# Patient Record
Sex: Male | Born: 1945
Health system: Southern US, Community
[De-identification: ages and names within clinical notes are randomized; demographics above are authoritative.]

## PROBLEM LIST (undated history)

## (undated) DIAGNOSIS — F32A Depression, unspecified: Secondary | ICD-10-CM

## (undated) DIAGNOSIS — M1990 Unspecified osteoarthritis, unspecified site: Secondary | ICD-10-CM

## (undated) DIAGNOSIS — F329 Major depressive disorder, single episode, unspecified: Secondary | ICD-10-CM

## (undated) DIAGNOSIS — H9190 Unspecified hearing loss, unspecified ear: Secondary | ICD-10-CM

## (undated) DIAGNOSIS — Z9889 Other specified postprocedural states: Secondary | ICD-10-CM

## (undated) DIAGNOSIS — I10 Essential (primary) hypertension: Secondary | ICD-10-CM

## (undated) DIAGNOSIS — I509 Heart failure, unspecified: Secondary | ICD-10-CM

## (undated) DIAGNOSIS — C670 Malignant neoplasm of trigone of bladder: Secondary | ICD-10-CM

## (undated) DIAGNOSIS — J449 Chronic obstructive pulmonary disease, unspecified: Secondary | ICD-10-CM

## (undated) DIAGNOSIS — IMO0001 Reserved for inherently not codable concepts without codable children: Secondary | ICD-10-CM

## (undated) DIAGNOSIS — R112 Nausea with vomiting, unspecified: Secondary | ICD-10-CM

## (undated) DIAGNOSIS — C801 Malignant (primary) neoplasm, unspecified: Secondary | ICD-10-CM

## (undated) HISTORY — DX: Malignant neoplasm of trigone of bladder: C67.0

---

## 2004-05-08 ENCOUNTER — Ambulatory Visit (HOSPITAL_COMMUNITY): Admission: RE | Admit: 2004-05-08 | Discharge: 2004-05-08 | Payer: Self-pay | Admitting: Internal Medicine

## 2013-03-29 ENCOUNTER — Other Ambulatory Visit (HOSPITAL_COMMUNITY): Payer: Self-pay | Admitting: Internal Medicine

## 2013-03-29 ENCOUNTER — Ambulatory Visit (HOSPITAL_COMMUNITY)
Admission: RE | Admit: 2013-03-29 | Discharge: 2013-03-29 | Disposition: A | Discharge status: Home / Self Care | Payer: Medicare Other | Source: Ambulatory Visit | Attending: Internal Medicine | Admitting: Internal Medicine

## 2013-03-29 DIAGNOSIS — F17200 Nicotine dependence, unspecified, uncomplicated: Secondary | ICD-10-CM | POA: Insufficient documentation

## 2013-03-29 DIAGNOSIS — R05 Cough: Secondary | ICD-10-CM | POA: Insufficient documentation

## 2013-03-29 DIAGNOSIS — R634 Abnormal weight loss: Secondary | ICD-10-CM

## 2013-03-29 DIAGNOSIS — R059 Cough, unspecified: Secondary | ICD-10-CM

## 2013-03-29 DIAGNOSIS — R0989 Other specified symptoms and signs involving the circulatory and respiratory systems: Secondary | ICD-10-CM | POA: Insufficient documentation

## 2013-03-29 DIAGNOSIS — I10 Essential (primary) hypertension: Secondary | ICD-10-CM | POA: Insufficient documentation

## 2013-03-31 ENCOUNTER — Ambulatory Visit (HOSPITAL_COMMUNITY)
Admission: RE | Admit: 2013-03-31 | Discharge: 2013-03-31 | Disposition: A | Discharge status: Home / Self Care | Payer: Medicare Other | Source: Ambulatory Visit | Attending: Internal Medicine | Admitting: Internal Medicine

## 2013-03-31 DIAGNOSIS — R911 Solitary pulmonary nodule: Secondary | ICD-10-CM | POA: Insufficient documentation

## 2013-03-31 DIAGNOSIS — R1032 Left lower quadrant pain: Secondary | ICD-10-CM | POA: Insufficient documentation

## 2013-03-31 DIAGNOSIS — R634 Abnormal weight loss: Secondary | ICD-10-CM

## 2013-03-31 DIAGNOSIS — R933 Abnormal findings on diagnostic imaging of other parts of digestive tract: Secondary | ICD-10-CM | POA: Insufficient documentation

## 2013-03-31 DIAGNOSIS — K7689 Other specified diseases of liver: Secondary | ICD-10-CM | POA: Insufficient documentation

## 2013-03-31 MED ORDER — IOHEXOL 300 MG/ML  SOLN
100.0000 mL | Freq: Once | INTRAMUSCULAR | Status: AC | PRN
  Administered 2013-03-31: 100 mL via INTRAVENOUS

## 2013-04-11 ENCOUNTER — Other Ambulatory Visit (HOSPITAL_COMMUNITY): Payer: Self-pay | Admitting: Internal Medicine

## 2013-04-11 DIAGNOSIS — R05 Cough: Secondary | ICD-10-CM

## 2013-04-11 DIAGNOSIS — R059 Cough, unspecified: Secondary | ICD-10-CM

## 2013-04-11 DIAGNOSIS — R634 Abnormal weight loss: Secondary | ICD-10-CM

## 2013-04-14 ENCOUNTER — Ambulatory Visit (HOSPITAL_COMMUNITY)
Admission: RE | Admit: 2013-04-14 | Discharge: 2013-04-14 | Disposition: A | Discharge status: Home / Self Care | Payer: Medicare Other | Source: Ambulatory Visit | Attending: Internal Medicine | Admitting: Internal Medicine

## 2013-04-14 DIAGNOSIS — R05 Cough: Secondary | ICD-10-CM | POA: Insufficient documentation

## 2013-04-14 DIAGNOSIS — R634 Abnormal weight loss: Secondary | ICD-10-CM

## 2013-04-14 DIAGNOSIS — K7689 Other specified diseases of liver: Secondary | ICD-10-CM | POA: Insufficient documentation

## 2013-04-14 DIAGNOSIS — R059 Cough, unspecified: Secondary | ICD-10-CM

## 2013-04-14 MED ORDER — IOHEXOL 300 MG/ML  SOLN
80.0000 mL | Freq: Once | INTRAMUSCULAR | Status: AC | PRN
  Administered 2013-04-14: 80 mL via INTRAVENOUS

## 2015-05-10 ENCOUNTER — Ambulatory Visit (INDEPENDENT_AMBULATORY_CARE_PROVIDER_SITE_OTHER): Payer: Medicare Other | Admitting: Otolaryngology

## 2015-05-10 DIAGNOSIS — J343 Hypertrophy of nasal turbinates: Secondary | ICD-10-CM | POA: Diagnosis not present

## 2015-05-10 DIAGNOSIS — J342 Deviated nasal septum: Secondary | ICD-10-CM | POA: Diagnosis not present

## 2015-07-19 ENCOUNTER — Ambulatory Visit (INDEPENDENT_AMBULATORY_CARE_PROVIDER_SITE_OTHER): Payer: Medicare Other | Admitting: Otolaryngology

## 2015-07-19 DIAGNOSIS — J342 Deviated nasal septum: Secondary | ICD-10-CM

## 2015-07-19 DIAGNOSIS — J343 Hypertrophy of nasal turbinates: Secondary | ICD-10-CM | POA: Diagnosis not present

## 2016-01-10 ENCOUNTER — Other Ambulatory Visit (HOSPITAL_COMMUNITY): Payer: Self-pay | Admitting: Internal Medicine

## 2016-01-10 DIAGNOSIS — R319 Hematuria, unspecified: Secondary | ICD-10-CM

## 2016-01-16 ENCOUNTER — Ambulatory Visit (HOSPITAL_COMMUNITY)
Admission: RE | Admit: 2016-01-16 | Discharge: 2016-01-16 | Disposition: A | Discharge status: Home / Self Care | Payer: Medicare Other | Source: Ambulatory Visit | Attending: Internal Medicine | Admitting: Internal Medicine

## 2016-01-16 DIAGNOSIS — R7989 Other specified abnormal findings of blood chemistry: Secondary | ICD-10-CM | POA: Insufficient documentation

## 2016-01-16 DIAGNOSIS — N329 Bladder disorder, unspecified: Secondary | ICD-10-CM | POA: Diagnosis not present

## 2016-01-16 DIAGNOSIS — R319 Hematuria, unspecified: Secondary | ICD-10-CM

## 2016-01-17 ENCOUNTER — Ambulatory Visit (INDEPENDENT_AMBULATORY_CARE_PROVIDER_SITE_OTHER): Payer: Medicare Other | Admitting: Otolaryngology

## 2016-01-29 ENCOUNTER — Ambulatory Visit: Payer: Medicare Other | Admitting: Urology

## 2016-02-05 ENCOUNTER — Ambulatory Visit (INDEPENDENT_AMBULATORY_CARE_PROVIDER_SITE_OTHER): Payer: Medicare Other | Admitting: Urology

## 2016-02-05 DIAGNOSIS — C679 Malignant neoplasm of bladder, unspecified: Secondary | ICD-10-CM | POA: Diagnosis not present

## 2016-02-12 ENCOUNTER — Other Ambulatory Visit: Payer: Self-pay

## 2016-02-12 ENCOUNTER — Other Ambulatory Visit: Payer: Self-pay | Admitting: Urology

## 2016-02-12 DIAGNOSIS — C670 Malignant neoplasm of trigone of bladder: Secondary | ICD-10-CM

## 2016-02-12 DIAGNOSIS — C679 Malignant neoplasm of bladder, unspecified: Secondary | ICD-10-CM

## 2016-02-12 DIAGNOSIS — R319 Hematuria, unspecified: Secondary | ICD-10-CM

## 2016-02-18 ENCOUNTER — Ambulatory Visit (HOSPITAL_COMMUNITY)
Admission: RE | Admit: 2016-02-18 | Discharge: 2016-02-18 | Disposition: A | Discharge status: Home / Self Care | Payer: Medicare Other | Source: Ambulatory Visit | Attending: Urology | Admitting: Urology

## 2016-02-18 DIAGNOSIS — I700 Atherosclerosis of aorta: Secondary | ICD-10-CM | POA: Insufficient documentation

## 2016-02-18 DIAGNOSIS — C670 Malignant neoplasm of trigone of bladder: Secondary | ICD-10-CM | POA: Insufficient documentation

## 2016-02-18 DIAGNOSIS — K439 Ventral hernia without obstruction or gangrene: Secondary | ICD-10-CM | POA: Diagnosis not present

## 2016-02-18 LAB — POCT I-STAT CREATININE: Creatinine, Ser: 1.4 mg/dL — ABNORMAL HIGH (ref 0.61–1.24)

## 2016-02-18 MED ORDER — IOHEXOL 300 MG/ML  SOLN
100.0000 mL | Freq: Once | INTRAMUSCULAR | Status: AC | PRN
Start: 2016-02-18 — End: 2016-02-18
  Administered 2016-02-18: 100 mL via INTRAVENOUS

## 2016-02-19 ENCOUNTER — Telehealth: Payer: Self-pay | Admitting: Urology

## 2016-02-19 NOTE — Telephone Encounter (Signed)
Carl Murphy Surgery Date: 03/25/16 between 12-1:00 With Dr Diona Fanti            Pre op: 03/20/16 Post op: 04/22/16 @ 1:30pm  Patient has been advised to call your office with any medical questions and to call me with any scheduling questions

## 2016-03-19 NOTE — Patient Instructions (Addendum)
Carl Murphy  03/19/2016     @PREFPERIOPPHARMACY @   Your procedure is scheduled on  03/25/2016   Report to [facility] at  89  A.M.  Call this number if you have problems the morning of surgery:  [phone]   Remember:  Do not eat food or drink liquids after midnight.  Take these medicines the morning of surgery with A SIP OF WATER  Coreg, celexa, hyzaar.   Do not wear jewelry, make-up or nail polish.  Do not wear lotions, powders, or perfumes.  You may wear deodorant.  Do not shave 48 hours prior to surgery.  Men may shave face and neck.  Do not bring valuables to the hospital.  [facility] is not responsible for any belongings or valuables.  Contacts, dentures or bridgework may not be worn into surgery.  Leave your suitcase in the car.  After surgery it may be brought to your room.  For patients admitted to the hospital, discharge time will be determined by your treatment team.  Patients discharged the day of surgery will not be allowed to drive home.   Name and phone number of your driver:   family Special instructions:  none  Please read over the following fact sheets that you were given. Coughing and Deep Breathing, Surgical Site Infection Prevention, Anesthesia Post-op Instructions and Care and Recovery After Surgery      Transurethral Resection, Bladder Tumor A cancerous growth (tumor) can develop on the inside wall of the bladder. The bladder is the organ that holds urine. One way to remove the tumor is a procedure called a transurethral resection. The tumor is removed (resected) through the tube that carries urine from the bladder out of the body (urethra). No cuts (incisions) are made in the skin. Instead, the procedure is done through a thin telescope, called a resectoscope. Attached to it is a light and usually a tiny camera. The resectoscope is put into the urethra. In men, the urethra opens at the end of the penis. In women, it opens just above  the vagina.  A transurethral resection is usually used to remove tumors that have not gotten too big or too deep. These are called Stage 0, Stage 1 or Stage 2 bladder cancers. LET YOUR CAREGIVER KNOW ABOUT:  On the day of the procedure, your caregivers will need to know the last time you had anything to eat or drink. This includes water, gum, and candy. In advance, make sure they know about:   Any allergies.  All medications you are taking, including:  Herbs, eyedrops, over-the-counter medications and creams.  Blood thinners (anticoagulants), aspirin or other drugs that could affect blood clotting.  Use of steroids (by mouth or as creams).  Previous problems with anesthetics, including local anesthetics.  Possibility of pregnancy, if this applies.  Any history of blood clots.  Any history of bleeding or other blood problems.  Previous surgery.  Smoking history.  Any recent symptoms of colds or infections.  Other health problems. RISKS AND COMPLICATIONS This is usually a safe procedure. Every procedure has risks, though. For a transurethral resection, they include:  Infection. Antibiotic medication would need to be taken.  Bleeding.  Light bleeding may last for several days after the procedure.  If bleeding continues or is heavy, the bladder may need rinsing. Or, a new catheter might be put in for awhile.  Sometimes bed rest is needed.  Urination problems.  Pain and burning  can occur when urinating. This usually goes away in a few days.  Scarring from the procedure can block the flow of urine.  Bladder damage.  It can be punctured or torn during removal of the tumor. If this happens, a catheter might be needed for longer. Antibiotics would be taken while the bladder heals.  Urine can leak through the hole or tear into the abdomen. If this happens, surgery may be needed to repair the bladder. BEFORE THE PROCEDURE   A medical evaluation will be done. This may  include:  A physical examination.  Urine test. This is to make sure you do not have a urinary tract infection.  Blood tests.  A test that checks the heart's rhythm (electrocardiogram).  Talking with an anesthesiologist. This is the person who will be in charge of the medication (anesthesia) to keep you from feeling pain during the transurethral resection. You might be asleep during the procedure (general anesthesia) or numb from the waist down, but awake during the procedure (spinal anesthesia). Ask your surgeon what to expect.  The person who is having a transurethral resection needs to give what is called informed consent. This requires signing a legal paper that gives permission for the procedure. To give informed consent:  You must understand how the procedure is done and why.  You must be told all the risks and benefits of the procedure.  You must sign the consent. Sometimes a legal guardian can do this.  Signing should be witnessed by a healthcare professional.  The day before the surgery, eat only a light dinner. Then, do not eat or drink anything for at least 8 hours before the surgery. Ask your caregiver if it is OK to take any needed medicines with a sip of water.  Arrive at least an hour before the surgery or whenever your surgeon recommends. This will give you time to check in and fill out any needed paperwork. PROCEDURE  The preparation:  You will change into a hospital gown.  A needle will be inserted in your arm. This is an intravenous access tube (IV). Medication will be able to flow directly into your body through this needle.  Small monitors will be put on your body. They are used to check your heart, blood pressure, and oxygen level.  You might be given medication that will help you relax (sedative).  You will be given a general anesthetic or spinal anesthesia.  The procedure:  Once you are asleep or numb from the waist down, your legs will be placed in  stirrups.  The resectoscope will be passed through the urethra into the bladder.  Fluid will be passed through the resectoscope. This will fill the bladder with water.  The surgeon will examine the bladder through the scope. If the scope has a camera, it can take pictures from inside the bladder. They can be projected onto a TV screen.  The surgeon will use various tools to remove the tumor in small pieces. Sometimes a laser (a beam of light energy) is used. Other tools may use electric current.  A tube (catheter) will often be placed so that urine can drain into a bag outside the body. This process helps stop bleeding. This tube keeps blood clots from blocking the urethra.  The procedure usually takes 30 to 45 minutes. AFTER THE PROCEDURE   You will stay in a recovery area until the anesthesia has worn off. Your blood pressure and pulse will be checked every so often. Then  you will be taken to a hospital room.  You may continue to get fluids through the IV for awhile.  Some pain is normal. The catheter might be uncomfortable. Pain is usually not severe. If it is, ask for pain medicine.  Your urine may look bloody after a transurethral resection. This is normal.  If bleeding is heavy, a hospital caregiver may rinse out the bladder (irrigation) through the catheter.  Once the urine is clear, the catheter will be taken out.  You will need to stay in the hospital until you can urinate on your own.  Most people stay in the hospital for up to 4 days. PROGNOSIS   Transurethral resection is considered the best way to treat bladder tumors that are not too far along. For most people, the treatment is successful. Sometimes, though, more treatment is needed.  Bladder cancers can come back even after a successful procedure. Because of this, be sure to have a checkup with your caregiver every 3 to 6 months. If everything is OK for 3 years, you can reduce the checkups to once a year.   This  information is not intended to replace advice given to you by your health care provider. Make sure you discuss any questions you have with your health care provider.   Document Released: 09/13/2009 Document Revised: 02/09/2012 Document Reviewed: 11/19/2009 Elsevier Interactive Patient Education 2016 [employer] PATIENT INSTRUCTIONS POST-ANESTHESIA  IMMEDIATELY FOLLOWING SURGERY:  Do not drive or operate machinery for the first twenty four hours after surgery.  Do not make any important decisions for twenty four hours after surgery or while taking narcotic pain medications or sedatives.  If you develop intractable nausea and vomiting or a severe headache please notify your doctor immediately.  FOLLOW-UP:  Please make an appointment with your surgeon as instructed. You do not need to follow up with anesthesia unless specifically instructed to do so.  WOUND CARE INSTRUCTIONS (if applicable):  Keep a dry clean dressing on the anesthesia/puncture wound site if there is drainage.  Once the wound has quit draining you may leave it open to air.  Generally you should leave the bandage intact for twenty four hours unless there is drainage.  If the epidural site drains for more than 36-48 hours please call the anesthesia department.  QUESTIONS?:  Please feel free to call your physician or the hospital operator if you have any questions, and they will be happy to assist you.

## 2016-03-20 ENCOUNTER — Other Ambulatory Visit: Payer: Self-pay

## 2016-03-20 ENCOUNTER — Encounter (HOSPITAL_COMMUNITY)
Admission: RE | Admit: 2016-03-20 | Discharge: 2016-03-20 | Disposition: A | Discharge status: Home / Self Care | Payer: Medicare Other | Source: Ambulatory Visit | Attending: Urology | Admitting: Urology

## 2016-03-20 ENCOUNTER — Encounter (HOSPITAL_COMMUNITY): Payer: Self-pay

## 2016-03-20 DIAGNOSIS — Z01818 Encounter for other preprocedural examination: Secondary | ICD-10-CM | POA: Diagnosis not present

## 2016-03-20 HISTORY — DX: Chronic obstructive pulmonary disease, unspecified: J44.9

## 2016-03-20 HISTORY — DX: Essential (primary) hypertension: I10

## 2016-03-20 HISTORY — DX: Depression, unspecified: F32.A

## 2016-03-20 HISTORY — DX: Heart failure, unspecified: I50.9

## 2016-03-20 HISTORY — DX: Major depressive disorder, single episode, unspecified: F32.9

## 2016-03-20 LAB — CBC WITH DIFFERENTIAL/PLATELET
BASOS PCT: 0 %
Basophils Absolute: 0 10*3/uL (ref 0.0–0.1)
EOS ABS: 0.2 10*3/uL (ref 0.0–0.7)
EOS PCT: 2 %
HCT: 40 % (ref 39.0–52.0)
HEMOGLOBIN: 13.1 g/dL (ref 13.0–17.0)
Lymphocytes Relative: 16 %
Lymphs Abs: 1.4 10*3/uL (ref 0.7–4.0)
MCH: 30.8 pg (ref 26.0–34.0)
MCHC: 32.8 g/dL (ref 30.0–36.0)
MCV: 93.9 fL (ref 78.0–100.0)
MONOS PCT: 10 %
Monocytes Absolute: 0.9 10*3/uL (ref 0.1–1.0)
NEUTROS PCT: 72 %
Neutro Abs: 6.4 10*3/uL (ref 1.7–7.7)
PLATELETS: 259 10*3/uL (ref 150–400)
RBC: 4.26 MIL/uL (ref 4.22–5.81)
RDW: 13.4 % (ref 11.5–15.5)
WBC: 8.9 10*3/uL (ref 4.0–10.5)

## 2016-03-20 LAB — BASIC METABOLIC PANEL
Anion gap: 9 (ref 5–15)
BUN: 42 mg/dL — AB (ref 6–20)
CALCIUM: 8.6 mg/dL — AB (ref 8.9–10.3)
CHLORIDE: 106 mmol/L (ref 101–111)
CO2: 24 mmol/L (ref 22–32)
CREATININE: 1.79 mg/dL — AB (ref 0.61–1.24)
GFR, EST AFRICAN AMERICAN: 43 mL/min — AB (ref >60)
GFR, EST NON AFRICAN AMERICAN: 37 mL/min — AB (ref >60)
Glucose, Bld: 101 mg/dL — ABNORMAL HIGH (ref 65–99)
Potassium: 4.4 mmol/L (ref 3.5–5.1)
SODIUM: 139 mmol/L (ref 135–145)

## 2016-03-20 NOTE — Pre-Procedure Instructions (Signed)
Patient given information to sign up for my chart at home. 

## 2016-03-24 MED ORDER — SODIUM CHLORIDE 0.9 % IV SOLN: 50.0000 mg | Freq: Once | INTRAVENOUS | Status: DC

## 2016-03-25 ENCOUNTER — Ambulatory Visit (HOSPITAL_COMMUNITY)
Admission: RE | Admit: 2016-03-25 | Discharge: 2016-03-26 | Disposition: A | Discharge status: Home / Self Care | Payer: Medicare Other | Source: Ambulatory Visit | Attending: Urology | Admitting: Urology

## 2016-03-25 ENCOUNTER — Encounter (HOSPITAL_COMMUNITY): Payer: Self-pay | Admitting: *Deleted

## 2016-03-25 ENCOUNTER — Ambulatory Visit (HOSPITAL_COMMUNITY): Payer: Medicare Other | Admitting: Anesthesiology

## 2016-03-25 ENCOUNTER — Encounter (HOSPITAL_COMMUNITY)
Admission: RE | Disposition: A | Discharge status: Home / Self Care | Payer: Self-pay | Source: Ambulatory Visit | Attending: Urology

## 2016-03-25 DIAGNOSIS — I509 Heart failure, unspecified: Secondary | ICD-10-CM | POA: Diagnosis not present

## 2016-03-25 DIAGNOSIS — I110 Hypertensive heart disease with heart failure: Secondary | ICD-10-CM | POA: Diagnosis not present

## 2016-03-25 DIAGNOSIS — F329 Major depressive disorder, single episode, unspecified: Secondary | ICD-10-CM | POA: Insufficient documentation

## 2016-03-25 DIAGNOSIS — F17210 Nicotine dependence, cigarettes, uncomplicated: Secondary | ICD-10-CM | POA: Insufficient documentation

## 2016-03-25 DIAGNOSIS — J449 Chronic obstructive pulmonary disease, unspecified: Secondary | ICD-10-CM | POA: Insufficient documentation

## 2016-03-25 DIAGNOSIS — C670 Malignant neoplasm of trigone of bladder: Secondary | ICD-10-CM | POA: Diagnosis present

## 2016-03-25 DIAGNOSIS — D494 Neoplasm of unspecified behavior of bladder: Secondary | ICD-10-CM | POA: Diagnosis present

## 2016-03-25 HISTORY — PX: TRANSURETHRAL RESECTION OF BLADDER TUMOR: SHX2575

## 2016-03-25 HISTORY — DX: Malignant neoplasm of trigone of bladder: C67.0

## 2016-03-25 SURGERY — TURBT (TRANSURETHRAL RESECTION OF BLADDER TUMOR)
Anesthesia: Spinal

## 2016-03-25 MED ORDER — HYDROCODONE-ACETAMINOPHEN 5-325 MG PO TABS
1.0000 | ORAL_TABLET | ORAL | Status: DC | PRN
  Administered 2016-03-25: 1 via ORAL
  Administered 2016-03-26: 2 via ORAL
  Filled 2016-03-25: qty 1
  Filled 2016-03-25: qty 2

## 2016-03-25 MED ORDER — MIDAZOLAM HCL 2 MG/2ML IJ SOLN
1.0000 mg | INTRAMUSCULAR | Status: DC | PRN
Start: 2016-03-25 — End: 2016-03-25
  Administered 2016-03-25: 2 mg via INTRAVENOUS

## 2016-03-25 MED ORDER — CEFAZOLIN SODIUM 1-5 GM-% IV SOLN
INTRAVENOUS | Status: AC
  Filled 2016-03-25: qty 50

## 2016-03-25 MED ORDER — CARVEDILOL 12.5 MG PO TABS
25.0000 mg | ORAL_TABLET | Freq: Two times a day (BID) | ORAL | Status: DC
  Administered 2016-03-25 – 2016-03-26 (×2): 25 mg via ORAL
  Filled 2016-03-25: qty 2
  Filled 2016-03-25 (×2): qty 1
  Filled 2016-03-25: qty 2

## 2016-03-25 MED ORDER — LOSARTAN POTASSIUM-HCTZ 100-25 MG PO TABS: 1.0000 | ORAL_TABLET | Freq: Every day | ORAL | Status: DC

## 2016-03-25 MED ORDER — ZOLPIDEM TARTRATE 5 MG PO TABS: 5.0000 mg | ORAL_TABLET | Freq: Every evening | ORAL | Status: DC | PRN

## 2016-03-25 MED ORDER — MIDAZOLAM HCL 2 MG/2ML IJ SOLN
INTRAMUSCULAR | Status: AC
  Filled 2016-03-25: qty 2

## 2016-03-25 MED ORDER — SULFAMETHOXAZOLE-TRIMETHOPRIM 800-160 MG PO TABS: 1.0000 | ORAL_TABLET | Freq: Two times a day (BID) | ORAL | Status: DC

## 2016-03-25 MED ORDER — ONDANSETRON HCL 4 MG/2ML IJ SOLN: 4.0000 mg | Freq: Once | INTRAMUSCULAR | Status: DC | PRN

## 2016-03-25 MED ORDER — NICOTINE 21 MG/24HR TD PT24
21.0000 mg | MEDICATED_PATCH | Freq: Every day | TRANSDERMAL | Status: DC
  Administered 2016-03-25 – 2016-03-26 (×2): 21 mg via TRANSDERMAL
  Filled 2016-03-25 (×2): qty 1

## 2016-03-25 MED ORDER — FENTANYL CITRATE (PF) 100 MCG/2ML IJ SOLN: 25.0000 ug | INTRAMUSCULAR | Status: DC | PRN

## 2016-03-25 MED ORDER — FENTANYL CITRATE (PF) 100 MCG/2ML IJ SOLN
INTRAMUSCULAR | Status: AC
  Filled 2016-03-25: qty 2

## 2016-03-25 MED ORDER — CITALOPRAM HYDROBROMIDE 20 MG PO TABS
20.0000 mg | ORAL_TABLET | Freq: Every day | ORAL | Status: DC
  Administered 2016-03-25: 20 mg via ORAL
  Filled 2016-03-25: qty 1

## 2016-03-25 MED ORDER — FENTANYL CITRATE (PF) 100 MCG/2ML IJ SOLN
INTRAMUSCULAR | Status: DC | PRN
  Administered 2016-03-25: 20 ug via INTRATHECAL

## 2016-03-25 MED ORDER — LOSARTAN POTASSIUM 50 MG PO TABS
100.0000 mg | ORAL_TABLET | Freq: Every day | ORAL | Status: DC
  Administered 2016-03-25 – 2016-03-26 (×2): 100 mg via ORAL
  Filled 2016-03-25 (×2): qty 2

## 2016-03-25 MED ORDER — STERILE WATER FOR IRRIGATION IR SOLN
Status: DC | PRN
  Administered 2016-03-25: 6000 mL
  Administered 2016-03-25: 3000 mL

## 2016-03-25 MED ORDER — FLUTICASONE PROPIONATE 50 MCG/ACT NA SUSP: 1.0000 | Freq: Every day | NASAL | Status: DC | PRN

## 2016-03-25 MED ORDER — SODIUM CHLORIDE 0.9 % IV SOLN
50.0000 mg | Freq: Once | INTRAVENOUS | Status: DC
  Filled 2016-03-25: qty 25

## 2016-03-25 MED ORDER — PROPOFOL 10 MG/ML IV BOLUS
INTRAVENOUS | Status: AC
  Filled 2016-03-25: qty 20

## 2016-03-25 MED ORDER — BUPIVACAINE IN DEXTROSE 0.75-8.25 % IT SOLN
INTRATHECAL | Status: DC | PRN
  Administered 2016-03-25: 1.5 mL via INTRATHECAL

## 2016-03-25 MED ORDER — ONDANSETRON HCL 4 MG/2ML IJ SOLN
INTRAMUSCULAR | Status: AC
  Filled 2016-03-25: qty 2

## 2016-03-25 MED ORDER — PROPOFOL 500 MG/50ML IV EMUL
INTRAVENOUS | Status: DC | PRN
  Administered 2016-03-25: 50 ug/kg/min via INTRAVENOUS

## 2016-03-25 MED ORDER — HYDROCHLOROTHIAZIDE 25 MG PO TABS
25.0000 mg | ORAL_TABLET | Freq: Every day | ORAL | Status: DC
  Administered 2016-03-25 – 2016-03-26 (×2): 25 mg via ORAL
  Filled 2016-03-25 (×2): qty 1

## 2016-03-25 MED ORDER — SODIUM CHLORIDE 0.45 % IV SOLN
INTRAVENOUS | Status: DC
  Administered 2016-03-25 – 2016-03-26 (×2): via INTRAVENOUS

## 2016-03-25 MED ORDER — STERILE WATER FOR IRRIGATION IR SOLN
Status: DC | PRN
  Administered 2016-03-25: 1000 mL

## 2016-03-25 MED ORDER — ACETAMINOPHEN 325 MG PO TABS: 650.0000 mg | ORAL_TABLET | ORAL | Status: DC | PRN

## 2016-03-25 MED ORDER — CEFAZOLIN SODIUM-DEXTROSE 2-4 GM/100ML-% IV SOLN
INTRAVENOUS | Status: AC
  Filled 2016-03-25: qty 100

## 2016-03-25 MED ORDER — BELLADONNA ALKALOIDS-OPIUM 16.2-60 MG RE SUPP: 1.0000 | Freq: Four times a day (QID) | RECTAL | Status: DC | PRN

## 2016-03-25 MED ORDER — IPRATROPIUM-ALBUTEROL 0.5-2.5 (3) MG/3ML IN SOLN
3.0000 mL | Freq: Once | RESPIRATORY_TRACT | Status: AC
  Administered 2016-03-25: 3 mL via RESPIRATORY_TRACT

## 2016-03-25 MED ORDER — DEXTROSE 5 % IV SOLN
1.0000 g | INTRAVENOUS | Status: AC
  Administered 2016-03-25: 2 g via INTRAVENOUS
  Filled 2016-03-25: qty 10

## 2016-03-25 MED ORDER — DOCUSATE SODIUM 100 MG PO CAPS
100.0000 mg | ORAL_CAPSULE | Freq: Two times a day (BID) | ORAL | Status: DC
  Administered 2016-03-25 – 2016-03-26 (×2): 100 mg via ORAL
  Filled 2016-03-25 (×2): qty 1

## 2016-03-25 MED ORDER — CEFAZOLIN SODIUM 1-5 GM-% IV SOLN: 1.0000 g | Freq: Once | INTRAVENOUS | Status: DC

## 2016-03-25 MED ORDER — IPRATROPIUM-ALBUTEROL 0.5-2.5 (3) MG/3ML IN SOLN
RESPIRATORY_TRACT | Status: AC
  Filled 2016-03-25: qty 3

## 2016-03-25 MED ORDER — EPHEDRINE SULFATE 50 MG/ML IJ SOLN
INTRAMUSCULAR | Status: DC | PRN
  Administered 2016-03-25 (×2): 10 mg via INTRAVENOUS

## 2016-03-25 MED ORDER — SULFAMETHOXAZOLE-TRIMETHOPRIM 800-160 MG PO TABS
1.0000 | ORAL_TABLET | Freq: Two times a day (BID) | ORAL | Status: DC
  Administered 2016-03-25 – 2016-03-26 (×2): 1 via ORAL
  Filled 2016-03-25 (×2): qty 1

## 2016-03-25 MED ORDER — LACTATED RINGERS IV SOLN
INTRAVENOUS | Status: DC
  Administered 2016-03-25 (×2): via INTRAVENOUS

## 2016-03-25 SURGICAL SUPPLY — 21 items
BAG URINE DRAINAGE (UROLOGICAL SUPPLIES) ×2 IMPLANT
BAG URO CATCHER STRL LF (MISCELLANEOUS) ×2 IMPLANT
CATH FOLEY 2WAY SLVR 30CC 22FR (CATHETERS) ×2 IMPLANT
CATH INTERMIT  6FR 70CM (CATHETERS) ×2 IMPLANT
ELECT LOOP 22F BIPOLAR SML (ELECTROSURGICAL) ×2
ELECT REM PT RETURN 9FT ADLT (ELECTROSURGICAL) ×4
ELECTRODE LOOP 22F BIPOLAR SML (ELECTROSURGICAL) ×1 IMPLANT
ELECTRODE REM PT RTRN 9FT ADLT (ELECTROSURGICAL) ×2 IMPLANT
GLOVE BIOGEL M 7.0 STRL (GLOVE) ×2 IMPLANT
GLOVE BIOGEL M 8.0 STRL (GLOVE) ×2 IMPLANT
GLOVE BIOGEL PI IND STRL 7.0 (GLOVE) ×1 IMPLANT
GLOVE BIOGEL PI INDICATOR 7.0 (GLOVE) ×1
GLOVE EXAM NITRILE MD LF STRL (GLOVE) ×2 IMPLANT
GOWN STRL REUS W/ TWL XL LVL3 (GOWN DISPOSABLE) ×3 IMPLANT
GOWN STRL REUS W/TWL XL LVL3 (GOWN DISPOSABLE) ×3
MANIFOLD NEPTUNE II (INSTRUMENTS) ×2 IMPLANT
PACK CYSTO (CUSTOM PROCEDURE TRAY) ×2 IMPLANT
PLUG CATH AND CAP STER (CATHETERS) ×4 IMPLANT
SYR 30ML LL (SYRINGE) ×2 IMPLANT
WATER STERILE IRR 1000ML POUR (IV SOLUTION) ×2 IMPLANT
WATER STERILE IRR 3000ML UROMA (IV SOLUTION) ×6 IMPLANT

## 2016-03-25 NOTE — Transfer of Care (Signed)
Immediate Anesthesia Transfer of Care Note  Patient: Carl Murphy  Procedure(s) Performed: Procedure(s): TRANSURETHRAL RESECTION OF BLADDER TUMOR (TURBT) (N/A)  Patient Location: PACU  Anesthesia Type:Spinal  Level of Consciousness: awake  Airway & Oxygen Therapy: Patient Spontanous Breathing and Patient connected to nasal cannula oxygen  Post-op Assessment: Report given to RN and Post -op Vital signs reviewed and stable  Post vital signs: Reviewed and stable  Last Vitals:  Filed Vitals:   03/25/16 1245 03/25/16 1250  BP:  146/75  Pulse:    Resp: 13 36    Complications: No apparent anesthesia complications

## 2016-03-25 NOTE — Op Note (Signed)
Preoperative diagnosis: 3 cm bladder tumor  Postoperative diagnosis: 6 cm bladder tumor  Principal procedure: TURBT of a 6 cm bladder tumor, placement of epirubicin intravesically  Surgeon: Eamon Tantillo  Anesthesia: Subarachnoid block  Specimen: Bladder tumor, to pathology  Drain: 22 French Foley catheter  Complications: None  Blood loss: Less than 25 mL  Indications: 70 year old male with gross hematuria, who was referred following renal ultrasound revealing a bladder tumor. CT revealed normal upper tracts, the previously noted bladder tumor, with cystoscopy confirming a large bladder tumor at the trigonal region. It was recommended that the patient undergo TURBT. I discussed this procedure with the patient and his daughter. They understand the procedure, the technique of placing epirubicin in the bladder postoperatively to limit the risk of recurrence, as well as risks and complications of TURBT. They understand and desire to proceed.  Description of procedure: Patient was properly identified in the holding area. He received preoperative IV antibiotics. He was taken to the operating room where subarachnoid block was administered. He was placed in the dorsolithotomy position. Genitalia and perineum were prepped and draped. Proper timeout was performed.  His urethral meatus was dilated to 30 [place] with [employer] sounds. I then passed a 28 French resectoscope sheath using the visual obturator. There were no urethral lesions. Prostate was nonobstructive. Bladder was entered and inspected circumferentially. Ureteral orifices were normal in location and appearance bilaterally. There was a large papillary tumor emanating from the right side of the bladder, just superior to the bladder neck. It was quite large, I felt larger than 5 cm, although it been measured at just over 3 cm on CT scan. No other bladder lesions were seen. I then placed a 6 [place] open-ended catheter in the right ureter to protect  the ureteral orifice during resection. I then removed the obturator, and passed the [place] with the cutting loop. I then proceeded to resect the base of the tumor, starting inferiorly, moving in a superior direction, staying even with the surface of the bladder. The base of the bladder tumor was a proximally 2 cm in diameter, with the main body of the tumor being a proximally 6 cm. The entire base of the bladder tumor was resected, down to the muscular layer. There was minimal erythema around the base of the bladder tumor. During resection, no perforation was noted in the wall of the bladder. Following resection of the entire bladder tumor, the bladder tumor base was then cauterized, and hemostasis was excellent. At this point, the bladder tumor fragments were irrigated from the bladder and sent for permanent specimen. These were labeled "bladder tumor". Inspection of the latter tumor base revealed persistent hemostasis. At this point, the resectoscope was removed, as well as the ureteral catheter. I then placed a 22 French Foley catheter, the balloon being filled with 30 mL of water. This was hooked to dependent drainage. Patient was then taken to the PACU in stable condition for recovery.  While in the PACU, I placed 50 mL of diluent with 50 mg of epirubicin and his catheter. The catheter was then plugged. The solution will be left indwelling for 1 hour, to be followed by drainage.

## 2016-03-25 NOTE — Anesthesia Procedure Notes (Addendum)
Spinal Patient location during procedure: OR Start time: 03/25/2016 1:08 PM Staffing Resident/CRNA: Tressie Stalker E Preanesthetic Checklist Completed: patient identified, site marked, surgical consent, pre-op evaluation, timeout performed, IV checked, risks and benefits discussed and monitors and equipment checked Spinal Block Patient position: left lateral decubitus Prep: Betadine Patient monitoring: heart rate, cardiac monitor, continuous pulse ox and blood pressure Approach: left paramedian Location: L3-4 Injection technique: single-shot Needle Needle type: Spinocan  Needle gauge: 22 G Needle length: 9 cm Assessment Sensory level: T8 Additional Notes  ATTEMPTS:1 TRAY MT:[id number] TRAY EXPIRATION DATE:10/2016

## 2016-03-25 NOTE — Progress Notes (Signed)
Dr Diona Fanti at bedside. Epirubicin 50 mg instilled in bladder per Dr Diona Fanti. Foley clamped.

## 2016-03-25 NOTE — Anesthesia Preprocedure Evaluation (Signed)
Anesthesia Evaluation  Patient identified by MRN, date of birth, ID band Patient awake    Reviewed: Allergy & Precautions, NPO status , Patient's Chart, lab work & pertinent test results, reviewed documented beta blocker date and time   Airway Mallampati: III  TM Distance: <3 FB Neck ROM: Full    Dental  (+) Missing, Caps, Chipped,    Pulmonary COPD,  COPD inhaler, Current Smoker,     + decreased breath sounds+ wheezing      Cardiovascular hypertension, Pt. on medications and Pt. on home beta blockers +CHF  Normal cardiovascular exam     Neuro/Psych Depression    GI/Hepatic   Endo/Other    Renal/GU Renal InsufficiencyRenal disease     Musculoskeletal   Abdominal Normal abdominal exam  (+)   Peds  Hematology   Anesthesia Other Findings   Reproductive/Obstetrics                             Anesthesia Physical Anesthesia Plan  ASA: III  Anesthesia Plan: Spinal   Post-op Pain Management:    Induction: Intravenous  Airway Management Planned: Nasal Cannula  Additional Equipment:   Intra-op Plan:   Post-operative Plan:   Informed Consent: I have reviewed the patients History and Physical, chart, labs and discussed the procedure including the risks, benefits and alternatives for the proposed anesthesia with the patient or authorized representative who has indicated his/her understanding and acceptance.   Dental advisory given  Plan Discussed with: CRNA  Anesthesia Plan Comments:         Anesthesia Quick Evaluation

## 2016-03-25 NOTE — Discharge Instructions (Signed)
Transurethral Resection of Bladder Tumor (TURBT)  °Definition:  °Transurethral Resection of the Bladder Tumor is a surgical procedure used to diagnose and remove tumors within the bladder. TURBT is the most common treatment for early stage bladder cancer.  ° °General instructions:  °Your recent bladder surgery requires very little post hospital care but some definite precautions.  °Despite the fact that no skin incisions were used, the area around the tumor removal site is raw and covered with scabs to promote healing and prevent bleeding. Certain precautions are needed to insure that the scabs are not disturbed over the next 2-4 weeks while the healing proceeds.  °Because the raw surface inside your bladder and the irritating effects of urine you may expect frequency of urination and/or urgency (a stronger desire to urinate) and perhaps even getting up at night more often. This will usually resolve or improve slowly over the healing period. You may see some blood in your urine over the first 6 weeks. Do not be alarmed, even if the urine was clear for a while. Get off your feet and drink lots of fluids until clearing occurs. If you start to pass clots or don't improve call us.  ° °Diet:  °You may return to your normal diet immediately. Because of the raw surface of your bladder, alcohol, spicy foods, foods high in acid and drinks with caffeine may cause irritation or frequency and should be used in moderation. To keep your urine flowing freely and avoid constipation, drink plenty of fluids during the day (8-10 glasses). Tip: Avoid cranberry juice because it is very acidic. °  °Activity:  °Your physical activity needs to be restricted somewhat.  We suggest that you reduce your activity under the circumstances until the bleeding has stopped. Heavy lifting (greater than 20 lbs.) and heavy exertion should be limited for 2-3 weeks. ° °Bowels:  °It is important to keep your bowels regular during the postoperative period.  Straining with bowel movements can cause bleeding. A bowel movement every other day is reasonable. Use a mild laxative if needed, such as milk of magnesia 2-3 tablespoons, or 2 Dulcolax tablets. Call if you continue to have problems. If you had been taking narcotics for pain, before, during or after your surgery, you may be constipated.  ° °Medication:  °You should resume your pre-surgery medications unless told not to. In addition you may be given an antibiotic to prevent or treat infection. Antibiotics are not always necessary. All medication should be taken as prescribed until the bottles are finished unless you are having an unusual reaction to one of the drugs.  °General Anesthetic, Adult  °A doctor specialized in giving anesthesia (anesthesiologist) or a nurse specialized in giving anesthesia (nurse anesthetist) gives medicine that makes you sleep while a procedure is performed (general anesthetic). Once the general anesthetic has been administered, you will be in a sleeplike state in which you feel no pain. After having a general anesthetic you may feel:  °Dizzy.  °Weak.  °Drowsy.  °Confused.  °These feelings are normal and can be expected to last for up to 24 hours after the procedure. ° ° °LET YOUR CAREGIVER KNOW ABOUT:  °Allergies you have.  °Medications you are taking, including herbs, eye drops, over the counter medications, dietary supplements, and creams.  °Previous problems you have had with anesthetics or numbing medicines.  °Use of cigarettes, alcohol, or illicit drugs.  °Possibility of pregnancy, if this applies.  °History of bleeding or blood disorders, including blood clots and clotting   disorders.  °Previous surgeries you have had and types of anesthetics you have received.  °Family medical history, especially anesthetic problems.  °Other health problems.  ° °AFTER THE PROCEDURE  °After surgery, you will be taken to the recovery area where a nurse will monitor your progress. You will be allowed  to go home when you are awake, stable, taking fluids well, and without serious pain or complications.  °For the first 24 hours following an anesthetic:  °Have a responsible person with you.  °Do not drive a car. If you are alone, do not take public transportation.  °Do not engage in strenuous activity. You may usually resume normal activities the next day, or as advised by your caregiver.  °Do not drink alcohol.  °Do not take medicine that has not been prescribed by your caregiver.  °Do not sign important papers or make important decisions as your judgement may be impaired.  °You may resume a normal diet as directed.  °Change bandages (dressings) as directed.  °Only take over-the-counter or prescription medicines for pain, discomfort, or fever as directed by your caregiver.  °If you have questions or problems that seem related to the anesthetic, call the hospital and ask for the anesthetist, anesthesiologist, or anesthesia department.  ° °SEEK IMMEDIATE MEDICAL CARE IF:  °You develop a rash.  °You have difficulty breathing.  °You have chest pain.  °You have allergic problems.  °You have uncontrolled nausea.  °You have uncontrolled vomiting.  °You develop any serious bleeding, especially from the incision site.  °Document Released: 02/24/2008 Document Revised: 07/30/2011 Document Reviewed: 03/20/2011  °Exit[employer]® Patient Information ©2012 [employer].  ° ° °

## 2016-03-25 NOTE — H&P (Signed)
  Urology History and Physical Exam  CC: Bladder tumor  HPI: 70 year old male presents  For TURBT of a 3 cm right trigonal bladder tumor. This was apparent on an U/S performed for gross hematuria and confirmed by office cystoscopy. The patient has noted intermittent gross hematuria for 3 months or so.  He denies any abdominal pain, flank pain, prior history of kidney stones.  He does pass clots with this.  He has minimal dysuria.  He has not been treated for urinary tract infections previously.   PMH: Past Medical History  Diagnosis Date  . Hypertension   . Depression   . CHF (congestive heart failure) ([place])   . COPD (chronic obstructive pulmonary disease) (HCC)     PSH: No past surgical history on file.  Allergies: No Known Allergies  Medications: No prescriptions prior to admission     Social History: Social History   Social History  . Marital Status: Married    Spouse Name: N/A  . Number of Children: N/A  . Years of Education: N/A   Occupational History  . Not on file.   Social History Main Topics  . Smoking status: Current Every Day Smoker -- 2.00 packs/day for 50 years    Types: Cigarettes  . Smokeless tobacco: Not on file  . Alcohol Use: No     Comment: rare  . Drug Use: No  . Sexual Activity: No   Other Topics Concern  . Not on file   Social History Narrative  . No narrative on file    Family History: No family history on file.  Review of Systems: Genitourinary, constitutional, skin, eye, otolaryngeal, hematologic/lymphatic, cardiovascular, pulmonary, endocrine, musculoskeletal, gastrointestinal, neurological and psychiatric system(s) were reviewed and pertinent findings if present are noted and are otherwise negative.  Genitourinary: urinary frequency, feelings of urinary urgency, dysuria, nocturia, incontinence and hematuria.  Hematologic/Lymphatic: a tendency to easily bruise.  Respiratory: shortness of breath.           Physical  Exam: Physical Exam Constitutional: Well nourished and well developed . No acute distress.  ENT:. The ears and nose are normal in appearance.  Neck: The appearance of the neck is normal and no neck mass is present.  Pulmonary: No respiratory distress and normal respiratory rhythm and effort.  Abdomen: The abdomen is soft and nontender. No masses are palpated. No CVA tenderness.  An umbilical hernia is present, which is reducible. No hepatosplenomegaly noted.  Genitourinary: Examination of the penis demonstrates no discharge, no masses, no lesions and a normal meatus. The penis is uncircumcised. The scrotum is normal in appearance and without lesions. The right epididymis is palpably normal and non-tender. The left epididymis is palpably normal and non-tender. The right testis is palpably normal, non-tender and without masses. The left testis is normal, non-tender and without masses.  Lymphatics: The femoral and inguinal nodes are not enlarged or tender.  Skin: Normal skin turgor, no visible rash and no visible skin lesions.  Neuro/Psych:. Mood and affect are appropriate.   Studies:  No results for input(s): HGB, WBC, PLT in the last 72 hours.  No results for input(s): NA, K, CL, CO2, BUN, CREATININE, CALCIUM, GFRNONAA, GFRAA in the last 72 hours.  Invalid input(s): MAGNESIUM   No results for input(s): INR, APTT in the last 72 hours.  Invalid input(s): PT   Invalid input(s): ABG    Assessment:  3 cm right trigonal bladder tumor  Plan: TURBT followed by intravesical epirubicin instillation

## 2016-03-25 NOTE — Progress Notes (Signed)
Drainage bag connected to foley and draining clear red urine.

## 2016-03-26 ENCOUNTER — Encounter (HOSPITAL_COMMUNITY): Payer: Self-pay | Admitting: Urology

## 2016-03-26 DIAGNOSIS — C670 Malignant neoplasm of trigone of bladder: Secondary | ICD-10-CM | POA: Diagnosis not present

## 2016-03-26 MED ORDER — HYDROCODONE-ACETAMINOPHEN 5-325 MG PO TABS: 1.0000 | ORAL_TABLET | ORAL | Status: DC | PRN

## 2016-03-26 MED ORDER — ONDANSETRON HCL 4 MG/2ML IJ SOLN
4.0000 mg | Freq: Four times a day (QID) | INTRAMUSCULAR | Status: DC | PRN
  Administered 2016-03-26: 4 mg via INTRAVENOUS
  Filled 2016-03-26: qty 2

## 2016-03-26 NOTE — Addendum Note (Signed)
Addendum  created 03/26/16 1611 by Mickel Baas, CRNA   Modules edited: Charges VN

## 2016-03-26 NOTE — Anesthesia Postprocedure Evaluation (Signed)
Anesthesia Post Note  Patient: Carl Murphy  Procedure(s) Performed: Procedure(s) (LRB): TRANSURETHRAL RESECTION OF BLADDER TUMOR (TURBT) (N/A)  Patient location during evaluation: PACU Anesthesia Type: Spinal Level of consciousness: awake and alert and oriented Pain management: pain level controlled Vital Signs Assessment: post-procedure vital signs reviewed and stable Respiratory status: spontaneous breathing Cardiovascular status: stable Anesthetic complications: no    Last Vitals:  Filed Vitals:   03/25/16 1630 03/26/16 0500  BP: 132/64 147/75  Pulse: 69 65  Temp: 36.6 C 36.8 C  Resp: 18 16    Last Pain:  Filed Vitals:   03/26/16 1104  PainSc: 0-No pain                 Exavier Lina A

## 2016-03-26 NOTE — Progress Notes (Signed)
Discharged PT per MD order and protocol. Discharge handouts reviewed/explained. Education completed.  Pt verbalized understanding and left with all belongings and prescription. Bladder scanned per order and 75 cc noted. Pt requesting prescription of zofran due to nausea this morning. Called Urology office and spoke to receptionist and was told that message will be sent to the urologist and call patient to let them know RX was sent to [facility]  [facility]. IV catheter D/C.  Patient wheeled down by staff member.

## 2016-04-22 ENCOUNTER — Ambulatory Visit (INDEPENDENT_AMBULATORY_CARE_PROVIDER_SITE_OTHER): Payer: Medicare Other | Admitting: Urology

## 2016-04-22 DIAGNOSIS — C670 Malignant neoplasm of trigone of bladder: Secondary | ICD-10-CM | POA: Diagnosis not present

## 2016-05-15 ENCOUNTER — Encounter (HOSPITAL_COMMUNITY): Payer: Medicare Other | Attending: Hematology & Oncology | Admitting: Hematology & Oncology

## 2016-05-15 ENCOUNTER — Encounter (HOSPITAL_COMMUNITY): Payer: Self-pay | Admitting: Lab

## 2016-05-15 ENCOUNTER — Encounter (HOSPITAL_COMMUNITY): Payer: Medicare Other

## 2016-05-15 ENCOUNTER — Encounter (HOSPITAL_COMMUNITY): Payer: Self-pay | Admitting: Hematology & Oncology

## 2016-05-15 VITALS — BP 145/66 | HR 74 | Temp 98.2°F | Resp 20 | Ht 69.0 in | Wt 190.3 lb

## 2016-05-15 DIAGNOSIS — C670 Malignant neoplasm of trigone of bladder: Secondary | ICD-10-CM | POA: Insufficient documentation

## 2016-05-15 DIAGNOSIS — Z720 Tobacco use: Secondary | ICD-10-CM | POA: Diagnosis not present

## 2016-05-15 DIAGNOSIS — Z79899 Other long term (current) drug therapy: Secondary | ICD-10-CM | POA: Insufficient documentation

## 2016-05-15 DIAGNOSIS — I509 Heart failure, unspecified: Secondary | ICD-10-CM | POA: Insufficient documentation

## 2016-05-15 DIAGNOSIS — I43 Cardiomyopathy in diseases classified elsewhere: Secondary | ICD-10-CM | POA: Diagnosis not present

## 2016-05-15 DIAGNOSIS — I110 Hypertensive heart disease with heart failure: Secondary | ICD-10-CM | POA: Diagnosis not present

## 2016-05-15 DIAGNOSIS — F17210 Nicotine dependence, cigarettes, uncomplicated: Secondary | ICD-10-CM | POA: Diagnosis not present

## 2016-05-15 DIAGNOSIS — J449 Chronic obstructive pulmonary disease, unspecified: Secondary | ICD-10-CM | POA: Diagnosis not present

## 2016-05-15 LAB — COMPREHENSIVE METABOLIC PANEL
ALT: 11 U/L — AB (ref 17–63)
AST: 20 U/L (ref 15–41)
Albumin: 3.2 g/dL — ABNORMAL LOW (ref 3.5–5.0)
Alkaline Phosphatase: 63 U/L (ref 38–126)
Anion gap: 6 (ref 5–15)
BILIRUBIN TOTAL: 0.4 mg/dL (ref 0.3–1.2)
BUN: 22 mg/dL — AB (ref 6–20)
CALCIUM: 8.6 mg/dL — AB (ref 8.9–10.3)
CO2: 26 mmol/L (ref 22–32)
Chloride: 103 mmol/L (ref 101–111)
Creatinine, Ser: 1.19 mg/dL (ref 0.61–1.24)
Glucose, Bld: 115 mg/dL — ABNORMAL HIGH (ref 65–99)
POTASSIUM: 4.1 mmol/L (ref 3.5–5.1)
Sodium: 135 mmol/L (ref 135–145)
Total Protein: 6.5 g/dL (ref 6.5–8.1)

## 2016-05-15 LAB — CBC WITH DIFFERENTIAL/PLATELET
Basophils Absolute: 0 10*3/uL (ref 0.0–0.1)
Basophils Relative: 0 %
Eosinophils Absolute: 0.2 10*3/uL (ref 0.0–0.7)
Eosinophils Relative: 2 %
HEMATOCRIT: 36.4 % — AB (ref 39.0–52.0)
HEMOGLOBIN: 12.1 g/dL — AB (ref 13.0–17.0)
LYMPHS ABS: 1.2 10*3/uL (ref 0.7–4.0)
LYMPHS PCT: 14 %
MCH: 30.7 pg (ref 26.0–34.0)
MCHC: 33.2 g/dL (ref 30.0–36.0)
MCV: 92.4 fL (ref 78.0–100.0)
MONOS PCT: 9 %
Monocytes Absolute: 0.8 10*3/uL (ref 0.1–1.0)
NEUTROS PCT: 75 %
Neutro Abs: 6.4 10*3/uL (ref 1.7–7.7)
PLATELETS: 381 10*3/uL (ref 150–400)
RBC: 3.94 MIL/uL — AB (ref 4.22–5.81)
RDW: 14.4 % (ref 11.5–15.5)
WBC: 8.5 10*3/uL (ref 4.0–10.5)

## 2016-05-15 NOTE — Progress Notes (Signed)
Referral sent to [facility].  Records faxed on 6/15 

## 2016-05-15 NOTE — Progress Notes (Signed)
[facility] NOTE  Patient Care Team: [name], MD as PCP - General (Internal Medicine)  CHIEF COMPLAINTS/PURPOSE OF CONSULTATION:   Oncology History   Per available records had bladder tumor removed but had extension into the muscularis propria. CT showed no extension beyond the bladder. Has seen 2 urologists in consultation. Adamant about not havaing a cystectomy and ileal conduit.     Malignant neoplasm of trigone of bladder (HCC)   02/18/2016 Imaging CT abdomen/pelvis 3.2 x 3 cm bladder mass c/w transitional cell carcinoma. No findings for local spread of disease or metastatic disease   03/25/2016 Surgery TURBT 3cm right trigonal bladder tumor. followed by intravesical epirubicin instillation   03/25/2016 Pathology Results bladder TURBT infiltrative high grade papillary urothelial carcinoma, invades the mulcularis propria (detrusor) no LVI    04/14/2016 Miscellaneous [name] consultation in [place] for consideration of cystectomy. patient also offered repeat resection with BCG, and CHEMO/XRT   COPD Hypertensive cardiomyopathy  HISTORY OF PRESENTING ILLNESS:  Carl Murphy 70 y.o. male is here for additional discussion of muscle invasive carcinoma of the bladder.   He is here today accompanied by his daughter Carl Murphy. He lives here in [place].  He underwent a TURBT of a 3 cm right trigonal bladder tumor. This was apparent on an U/S performed for gross hematuria and confirmed by office cystoscopy. The patient had noted intermittent gross hematuria for 3 months or so prior to the procedure. He reported passing clots with this. After his TURBT intravesical epirubicin instillation was performed. He has not noticed any gross hematuria since.   He notes he was given four options to manage his disease noting that it invaded the muscle -- one option was to do nothing.  He is mostly afraid of the post surgical complications of taking his bladder out as they  pertain to his lungs. He says he knows he smoked; it was by choice that he continued doing it. He says that he's afraid of his lungs and going under surgery for 7 hours. He says "the old lungs may not want to cooperate." He's also afraid of kidney problems and dialysis.  He indicates no interest whatsoever in smoking cessation. He notes he has cut back to 2 ppd and that has been an accomplishment.  He indicates that radiation at [facility] will be more easily accessible, but that above all else, he wants to access the best quality of care.  During the physical exam, Carl Murphy denies belly pain, abdominal pain. Has an umbilical hernia.  He reports a good appetite. He is fairly active, is involved with his grandchildren and has a good QOL. He does express interest in concurrent chemoXRT and notes that he does want to pursue some form of therapy he is just reluctant to pursue surgery.   He denies weight loss. He denies chest pain.   MEDICAL HISTORY:  Past Medical History  Diagnosis Date  . Hypertension   . Depression   . CHF (congestive heart failure) ([place])   . COPD (chronic obstructive pulmonary disease) ([place])     SURGICAL HISTORY: Past Surgical History  Procedure Laterality Date  . Transurethral resection of bladder tumor N/A 03/25/2016    Procedure: TRANSURETHRAL RESECTION OF BLADDER TUMOR (TURBT);  Surgeon: Franchot Gallo, MD;  Location: [facility];  Service: Urology;  Laterality: N/A;    SOCIAL HISTORY: Social History   Social History  . Marital Status: Married    Spouse Name: N/A  . Number  of Children: N/A  . Years of Education: N/A   Occupational History  . Not on file.   Social History Main Topics  . Smoking status: Current Every Day Smoker -- 2.00 packs/day for 50 years    Types: Cigarettes  . Smokeless tobacco: Not on file  . Alcohol Use: No     Comment: rare  . Drug Use: No  . Sexual Activity: No   Other Topics Concern  . 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  Both parents deceased Mom was early 51's when she died; he was around 80-something Dad had asthma and emphysema really bad; thinks his heart finally gave out He says his mother's "intestines broke" and it was "fast forward downhill from there."  He is an only child.   ALLERGIES:  has No Known Allergies.  MEDICATIONS:  Current Outpatient Prescriptions  Medication Sig Dispense Refill  . carvedilol (COREG) 25 MG tablet Take 1 tablet by mouth 2 (two) times daily.  11  . citalopram (CELEXA) 20 MG tablet Take 1 tablet by mouth daily.  11  . fluticasone (FLONASE) 50 MCG/ACT nasal spray Place 1 spray into both nostrils daily as needed for allergies or rhinitis.    Marland Kitchen losartan-hydrochlorothiazide (HYZAAR) 100-25 MG tablet Take 1 tablet by mouth daily.  11   No current facility-administered medications for this visit.    Review of Systems  Constitutional: Negative.  Negative for fever, chills, weight loss, malaise/fatigue and diaphoresis.  HENT: Negative.  Negative for congestion, ear discharge, ear pain, hearing loss, nosebleeds, sore throat and tinnitus.   Eyes: Negative.  Negative for blurred vision, double vision, photophobia, pain, discharge and redness.  Respiratory: Positive for cough and shortness of breath. Negative for hemoptysis, wheezing and stridor.        Says he has a smoker's cough.  Cardiovascular: Negative.  Negative for chest pain, palpitations, orthopnea, claudication, leg  swelling and PND.  Gastrointestinal: Negative.  Negative for heartburn, nausea, vomiting, abdominal pain, diarrhea, constipation, blood in stool and melena.  Genitourinary: Negative.  Negative for dysuria, urgency, frequency, hematuria and flank pain.  Musculoskeletal: Positive for joint pain. Negative for myalgias, back pain, falls and neck pain.  Skin: Negative.  Negative for itching and rash.  Neurological: Negative.  Negative for dizziness, tingling, tremors, sensory change, speech change, focal weakness, seizures, loss of consciousness, weakness and headaches.  Endo/Heme/Allergies: Negative.  Negative for environmental allergies and polydipsia. Does not bruise/bleed easily.  Psychiatric/Behavioral: Negative.  Negative for depression, suicidal ideas, hallucinations, memory loss and substance abuse. The patient is not nervous/anxious and does not have insomnia.   All other systems reviewed and are negative.  14 point ROS was done and is otherwise as detailed above or in HPI   PHYSICAL EXAMINATION: ECOG PERFORMANCE STATUS: 1 - Symptomatic but completely ambulatory  Filed Vitals:   05/15/16 1350  BP: 145/66  Pulse: 74  Temp: 98.2 F (36.8 C)  Resp: 20   Filed  Weights   05/15/16 1350  Weight: 190 lb 4.8 oz (86.32 kg)    Physical Exam  Constitutional: He is oriented to person, place, and time. No distress.  HENT:  Head: Normocephalic and atraumatic.  Mouth/Throat: Oropharynx is clear and moist. No oropharyngeal exudate.  Eyes: Conjunctivae and EOM are normal. Pupils are equal, round, and reactive to light. Right eye exhibits no discharge. Left eye exhibits no discharge. No scleral icterus.  Neck: Normal range of motion. Neck supple. No tracheal deviation present. No thyromegaly present.  Cardiovascular: Normal rate, regular rhythm and normal heart sounds.   No murmur heard. Pulmonary/Chest: Effort normal. No respiratory distress.  Coarse BS scattered throughout  Abdominal:  Soft. Bowel sounds are normal. He exhibits no distension and no mass. There is no tenderness. There is no rebound and no guarding.  Umbilical hernia present.  Musculoskeletal: Normal range of motion. He exhibits no edema or tenderness.  Lymphadenopathy:    He has no cervical adenopathy.  Neurological: He is alert and oriented to person, place, and time. No cranial nerve deficit. Gait normal. Coordination normal.  Skin: Skin is warm and dry. No rash noted. He is not diaphoretic. No erythema.  Psychiatric: Mood, memory, affect and judgment normal.  Nursing note and vitals reviewed.   LABORATORY DATA:  I have reviewed the data as listed Lab Results  Component Value Date   WBC 8.5 05/15/2016   HGB 12.1* 05/15/2016   HCT 36.4* 05/15/2016   MCV 92.4 05/15/2016   PLT 381 05/15/2016   CMP     Component Value Date/Time   NA 135 05/15/2016 1505   K 4.1 05/15/2016 1505   CL 103 05/15/2016 1505   CO2 26 05/15/2016 1505   GLUCOSE 115* 05/15/2016 1505   BUN 22* 05/15/2016 1505   CREATININE 1.19 05/15/2016 1505   CALCIUM 8.6* 05/15/2016 1505   PROT 6.5 05/15/2016 1505   ALBUMIN 3.2* 05/15/2016 1505   AST 20 05/15/2016 1505   ALT 11* 05/15/2016 1505   ALKPHOS 63 05/15/2016 1505   BILITOT 0.4 05/15/2016 1505   GFRNONAA >60 05/15/2016 1505   GFRAA >60 05/15/2016 1505     RADIOGRAPHIC STUDIES: I have personally reviewed the radiological images as listed and agreed with the findings in the report. CLINICAL DATA: Hematuria and frequent urination.  EXAM: CT ABDOMEN AND PELVIS WITHOUT AND WITH CONTRAST  TECHNIQUE: Multidetector CT imaging of the abdomen and pelvis was performed following the standard protocol before and following the bolus administration of intravenous contrast.  CONTRAST: 129mL OMNIPAQUE IOHEXOL 300 MG/ML SOLN  COMPARISON: CT scan 03/31/2013  FINDINGS: Lower chest: The lung bases are clear of acute process. No worrisome pulmonary lesions. No pleural  effusion. The heart is normal in size. No pericardial effusion. Coronary artery calcifications are noted. Dense distal aortic calcifications are noted. The esophagus is grossly normal.  Hepatobiliary: Small low-attenuation liver lesions are stable and likely small cysts. Lesion and segment 8 is also stable and likely a hemangioma. No worrisome hepatic lesions or intrahepatic biliary dilatation. The gallbladder is normal. No common bile duct dilatation.  Pancreas: No mass, inflammation or ductal dilatation.  Spleen: Normal size. No focal lesions.  Adrenals/Urinary Tract: The adrenal glands are normal and stable.  Numerous left renal artery calcifications. I do not see any definite calculi in the left collecting system. Small left renal cysts are noted. The no right renal calculi or hydronephrosis. Both ureters are normal. No ureteral calculi. No significant collecting system abnormality on the delayed  images.  There is a large irregular bladder mass at on the right side. This demonstrates moderate contrast enhancement. It measures 3.2 x 3.0 cm. No ureteral obstruction.  Stomach/Bowel: The stomach, duodenum, small bowel and colon are unremarkable without oral contrast. No inflammatory changes, mass lesions or obstructive findings. The terminal ileum is normal. The appendix is normal. Moderate sigmoid diverticulosis without findings for acute diverticulitis.  Vascular/Lymphatic: Advanced aortic and branch vessel atherosclerotic calcifications. No focal aneurysm or dissection. The major venous structures are patent.  No mesenteric or retroperitoneal mass or lymphadenopathy. Small scattered lymph nodes are noted. Small bilateral external iliac artery lymph nodes are stable. No pelvic lymphadenopathy. No inguinal adenopathy. The prostate gland and seminal vesicles are grossly normal. New line small  Small Periumbilical abdominal wall hernia containing fat and a  small bowel loop. This has enlarged since prior CT scan.  Other: No subcutaneous lesions or ascites.  Musculoskeletal: No significant bony findings.  IMPRESSION: 1. 3.2 x 3.0 cm bladder mass consistent with transitional cell carcinoma. No findings for local spread of disease or metastatic disease. 2. No worrisome renal or ureteral lesions. 3. Small benign-appearing and stable hepatic lesions. 4. Advanced atherosclerotic calcifications involving the aorta and branch vessels. 5. Small periumbilical abdominal wall hernia containing fat and a small bowel loop.   Electronically Signed  By: Marijo Sanes M.D.  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 He again reviewed the reasons why he is not interested in pursuing this.  I explained to him that it it possible that concurrent chemoXRT may fail, he notes that he is aware of this.   We discussed the following:  Bladder preservation approaches require careful surveillance following therapy for evidence of local recurrence and for detection of new urothelial cancers. For patients who fail to achieve a complete response and for those who subsequently relapse with muscle-invasive disease, salvage cystectomy is indicated Combined-modality therapy (CMT) incorporating maximal TURBT followed by radiation therapy with concurrent radiosensitizing chemotherapy can be a comparably effective regimen to preserve a functioning bladder in well-selected patients who are either poor candidates for radical cystectomy or patients who are motivated to maintain their native bladder. This combined modality approach utilizing TURBT followed by radiation therapy with concurrent chemotherapy is often referred to as trimodality therapy (TMT).  I will reach out to Dr. Eulogio Ditch to see if he feels the  patient has had maximal tumor debulking.   I addressed the importance of smoking cessation with the patient in detail.  We discussed the health benefits of cessation.  We discussed the health detriments of ongoing tobacco use including but not limited to COPD, heart disease and malignancy. We reviewed the multiple options for cessation and I offered to refer him to smoking cessation classes. We discussed other alternatives to quit such as chantix, wellbutrin. We will continue to address this moving forward.  I will get him set up for teaching and also some educational materials for the patient to take home. We will see him weekly once therapy starts.   ORDERS PLACED FOR THIS ENCOUNTER: Orders Placed This Encounter  Procedures  . CBC with Differential  . Comprehensive metabolic panel    All questions were answered. The patient knows to call the clinic with any problems, questions or concerns.  This document serves as a record of services personally performed by Larene Beach  Duriel Deery, MD. It was created on her behalf by Toni Amend, a trained medical scribe. The creation of this record is based on the scribe's personal observations and the provider's statements to them. This document has been checked and approved by the attending provider.  I have reviewed the above documentation for accuracy and completeness and I agree with the above.  This note was electronically signed.    Molli Hazard, MD  05/15/2016 8:40 PM

## 2016-05-15 NOTE — Patient Instructions (Addendum)
[place] at [facility] Discharge Instructions  RECOMMENDATIONS MADE BY THE CONSULTANT AND ANY TEST RESULTS WILL BE SENT TO YOUR REFERRING PHYSICIAN.  Chemo teaching   Refer to [facility] for radiation (bladder radiation) for bladder conservation  Return to see Dr. Whitney Muse or Gershon Mussel Day 1 of chemo  Labs today: CBC diff/CMP   Thank you for choosing [place] at [facility] to provide your oncology and hematology care.  To afford each patient quality time with our provider, please arrive at least 15 minutes before your scheduled appointment time.   Beginning January 23rd 2017 lab work for the [facility] will be done in the  Main lab at [facility] on 1st floor. If you have a lab appointment with the [facility] please come in thru the  Main Entrance and check in at the main information desk  You need to re-schedule your appointment should you arrive 10 or more minutes late.  We strive to give you quality time with our providers, and arriving late affects you and other patients whose appointments are after yours.  Also, if you no show three or more times for appointments you may be dismissed from the clinic at the providers discretion.     Again, thank you for choosing [facility].  Our hope is that these requests will decrease the amount of time that you wait before being seen by our physicians.       _____________________________________________________________  Should you have questions after your visit to [facility], please contact our office at [phone] between the hours of 8:30 a.m. and 4:30 p.m.  Voicemails left after 4:30 p.m. will not be returned until the following business day.  For prescription refill requests, have your pharmacy contact our office.         Resources For Cancer Patients and their Caregivers ? American Cancer Society: Can assist with transportation, wigs, general needs, runs Look  Good Feel Better.        580 010 6586 ? Cancer Care: Provides financial assistance, online support groups, medication/co-pay assistance.  1-800-813-HOPE (279) 864-7729) ? [place] Assists [place] cancer patients and their families through emotional , educational and financial support.  484-741-9353 ? [place] DSS Where to apply for food stamps, Medicaid and utility assistance. 705-764-8844 ? [facility]: Transportation to medical appointments. 657-210-5323 ? Social Security Administration: May apply for disability if have a Stage IV cancer. (512)325-3955 281-470-5427 ? [employer], Disability and Transit Services: Assists with nutrition, care and transit needs. [place] Support Programs: @10RELATIVEDAYS @ > Cancer Support Group  2nd Tuesday of the month 1pm-2pm, Journey Room  > Creative Journey  3rd Tuesday of the month 1130am-1pm, Journey Room  > Look Good Feel Better  1st Wednesday of the month 10am-12 noon, Journey Room (Call [place] to register [phone])

## 2016-05-24 MED ORDER — ONDANSETRON HCL 8 MG PO TABS: 8.0000 mg | ORAL_TABLET | Freq: Three times a day (TID) | ORAL | Status: DC | PRN

## 2016-05-24 MED ORDER — PROCHLORPERAZINE MALEATE 10 MG PO TABS: 10.0000 mg | ORAL_TABLET | Freq: Four times a day (QID) | ORAL | Status: DC | PRN

## 2016-05-24 NOTE — Patient Instructions (Addendum)
[place]   CHEMOTHERAPY INSTRUCTIONS  Premeds:  Aloxi - for nausea/vomiting prevention/reduction. Dexamethasone - - steroid - given to reduce the risk of you having an allergic type reaction to the chemotherapy. Dex can cause you to feel energized, nervous/anxious/jittery, make you have trouble sleeping, and/or make you feel hot/flushed in the face/neck and/or look pink/red in the face/neck. These side effects will pass as the Dex wears off. (takes 20 minutes to infuse)   Chemo:  Carboplatin - this medication can be hard on your kidneys - this is why we need you to drinking 64 oz of fluid (preferably water/decaff fluids) 2 days prior to chemo and for up to 4-5 days after chemo. Drink more if you can. This will help to keep your kidneys flushed. This can cause mild hair loss, lower your platelets (which keep you from bleeding out when you cut yourself), lower your white blood cells (fight infection), and cause nausea/vomiting. (takes 30 minutes to infuse)    SELF CARE ACTIVITIES WHILE ON CHEMOTHERAPY: Increase your fluid intake 48 hours prior to treatment and drink at least 2 quarts per day after treatment., No alcohol intake., No aspirin or other medications unless approved by your oncologist., Eat foods that are light and easy to digest., Eat foods at cold or room temperature., No fried, fatty, or spicy foods immediately before or after treatment., Have teeth cleaned professionally before starting treatment. Keep dentures and partial plates clean., Use soft toothbrush and do not use mouthwashes that contain alcohol. Biotene is a good mouthwash that is available at most pharmacies or may be ordered by calling [phone]., Use warm salt water gargles (1 teaspoon salt per 1 quart warm water) before and after meals and at bedtime. Or you may rinse with 2 tablespoons of three -percent hydrogen peroxide mixed in eight ounces of water., Always use sunscreen with SPF  (Sun Protection Factor) of 50 or higher., Use your nausea medication as directed to prevent nausea., Use your stool softener or laxative as directed to prevent constipation. and Use your anti-diarrheal medication as directed to stop diarrhea.  Please wash your hands for at least 30 seconds using warm soapy water. Handwashing is the #1 way to prevent the spread of germs. Stay away from sick people or people who are getting over a cold. If you develop respiratory systems such as green/yellow mucus production or productive cough or persistent cough let us know and we will see if you need an antibiotic. It is a good idea to keep a pair of gloves on when going into grocery stores/[employer] to decrease your risk of coming into contact with germs on the carts, etc. Carry alcohol hand gel with you at all times and use it frequently if out in public. All foods need to be cooked thoroughly. No raw foods. No medium or undercooked meats, eggs. If your food is cooked medium well, it does not need to be hot pink or saturated with bloody liquid at all. Vegetables and fruits need to be washed/rinsed under the faucet with a dish detergent before being consumed. You can eat raw fruits and vegetables unless we tell you otherwise but it would be best if you cooked them or bought frozen. Do not eat off of salad bars or hot bars unless you really trust the cleanliness of the restaurant. If you need dental work, please let Dr. Whitney Muse know before you go for your appointment so that we can coordinate the best possible time  for you in regards to your chemo regimen. You need to also let your dentist know that you are actively taking chemo. We may need to do labs prior to your dental appointment. We also want your bowels moving at least every other day. If this is not happening, we need to know so that we can get you on a bowel regimen to help you go. If you are going to have sex - you will need to wear a condom to protect your partner from  potential chemo exposure. Also, with the concurrent treatment of radiation and chemotherapy you may notice that you do not have any libido or a decreased libido. Getting/maintaining an erection may be difficult or you may not be able to while undergoing chemo/radiation. Please talk to Dr. Whitney Muse or your radiation doctor about this if this is an issue for you.      MEDICATIONS: You have been given prescriptions for the following medications:  Zofran/Ondansetron 8mg  tablet. Take 1 tablet every 8 hours as needed for nausea/vomiting. (#1 nausea med to take, this can constipate)  Compazine/Prochlorperazine 10mg  tablet. Take 1 tablet every 6 hours as needed for nausea/vomiting. (#2 nausea med to take, this can make you sleepy)   Over-the-Counter Meds:  Miralax 1 capful in 8 oz of fluid daily. May increase to two times a day if needed. This is a stool softener. If this doesn't work proceed you can add:  Senokot S  - start with 1 tablet two times a day and increase to 4 tablets two times a day if needed. (total of 8 tablets in a 24 hour period). This is a stimulant laxative.   Call us if this does not help your bowels move.   Imodium 2mg  capsule. Take 2 capsules after the 1st loose stool and then 1 capsule every 2 hours until you go a total of 12 hours without having a loose stool. Call the [facility] if loose stools continue. If diarrhea occurs @ bedtime, take 2 capsules @ bedtime. Then take 2 capsules every 4 hours until morning. Call [facility].    SYMPTOMS TO REPORT AS SOON AS POSSIBLE AFTER TREATMENT:  FEVER GREATER THAN 100.5 F  CHILLS WITH OR WITHOUT FEVER  NAUSEA AND VOMITING THAT IS NOT CONTROLLED WITH YOUR NAUSEA MEDICATION  UNUSUAL SHORTNESS OF BREATH  UNUSUAL BRUISING OR BLEEDING  TENDERNESS IN MOUTH AND THROAT WITH OR WITHOUT PRESENCE OF ULCERS  URINARY PROBLEMS  BOWEL PROBLEMS  UNUSUAL RASH    Wear comfortable clothing and clothing appropriate for easy  access to any Portacath or PICC line. Let us know if there is anything that we can do to make your therapy better!      I have been informed and understand all of the instructions given to me and have received a copy. I have been instructed to call the clinic [phone] or my family physician as soon as possible for continued medical care, if indicated. I do not have any more questions at this time but understand that I may call the [facility] or the Patient Navigator at [phone] during office hours should I have questions or need assistance in obtaining follow-up care.

## 2016-05-26 ENCOUNTER — Other Ambulatory Visit (HOSPITAL_COMMUNITY): Payer: Self-pay | Admitting: Hematology & Oncology

## 2016-05-26 ENCOUNTER — Encounter (HOSPITAL_COMMUNITY): Payer: Self-pay | Admitting: *Deleted

## 2016-05-27 ENCOUNTER — Encounter (HOSPITAL_COMMUNITY): Payer: Medicare Other

## 2016-05-27 ENCOUNTER — Encounter: Payer: Self-pay | Admitting: *Deleted

## 2016-05-27 DIAGNOSIS — C670 Malignant neoplasm of trigone of bladder: Secondary | ICD-10-CM

## 2016-05-27 NOTE — Progress Notes (Signed)
[facility] Clinical Social Work  Clinical Social Work met briefly with pt and his daughter at chemo teaching to introduce self, explain role of CSW, educate about Research scientist (medical). Pt provided with resource and CSW handouts with contact information and local resource details. Pt and family educated to reach out as needed for additional support and assistance.    Clinical Social Work interventions: Emotional support Resource Education    Loren Racer, [place] Tuesdays   [phone]

## 2016-05-29 ENCOUNTER — Other Ambulatory Visit (HOSPITAL_COMMUNITY): Payer: Self-pay | Admitting: *Deleted

## 2016-06-10 ENCOUNTER — Inpatient Hospital Stay (HOSPITAL_COMMUNITY): Payer: Medicare Other

## 2016-06-11 ENCOUNTER — Inpatient Hospital Stay (HOSPITAL_COMMUNITY): Payer: Medicare Other

## 2016-06-11 ENCOUNTER — Other Ambulatory Visit: Payer: Self-pay | Admitting: Urology

## 2016-06-13 ENCOUNTER — Encounter (HOSPITAL_COMMUNITY)
Admission: RE | Admit: 2016-06-13 | Discharge: 2016-06-13 | Disposition: A | Discharge status: Home / Self Care | Payer: Medicare Other | Source: Ambulatory Visit | Attending: Urology | Admitting: Urology

## 2016-06-13 ENCOUNTER — Encounter (HOSPITAL_COMMUNITY): Payer: Self-pay

## 2016-06-13 DIAGNOSIS — N400 Enlarged prostate without lower urinary tract symptoms: Secondary | ICD-10-CM | POA: Diagnosis not present

## 2016-06-13 DIAGNOSIS — Z79899 Other long term (current) drug therapy: Secondary | ICD-10-CM | POA: Diagnosis not present

## 2016-06-13 DIAGNOSIS — I110 Hypertensive heart disease with heart failure: Secondary | ICD-10-CM | POA: Diagnosis not present

## 2016-06-13 DIAGNOSIS — J449 Chronic obstructive pulmonary disease, unspecified: Secondary | ICD-10-CM | POA: Diagnosis not present

## 2016-06-13 DIAGNOSIS — C675 Malignant neoplasm of bladder neck: Secondary | ICD-10-CM | POA: Diagnosis not present

## 2016-06-13 DIAGNOSIS — I509 Heart failure, unspecified: Secondary | ICD-10-CM | POA: Diagnosis not present

## 2016-06-13 DIAGNOSIS — F17210 Nicotine dependence, cigarettes, uncomplicated: Secondary | ICD-10-CM | POA: Diagnosis not present

## 2016-06-13 HISTORY — DX: Unspecified hearing loss, unspecified ear: H91.90

## 2016-06-13 HISTORY — DX: Malignant (primary) neoplasm, unspecified: C80.1

## 2016-06-13 HISTORY — DX: Reserved for inherently not codable concepts without codable children: IMO0001

## 2016-06-13 HISTORY — DX: Unspecified osteoarthritis, unspecified site: M19.90

## 2016-06-13 LAB — CBC
HCT: 38.9 % — ABNORMAL LOW (ref 39.0–52.0)
Hemoglobin: 12.7 g/dL — ABNORMAL LOW (ref 13.0–17.0)
MCH: 30.5 pg (ref 26.0–34.0)
MCHC: 32.6 g/dL (ref 30.0–36.0)
MCV: 93.5 fL (ref 78.0–100.0)
PLATELETS: 262 10*3/uL (ref 150–400)
RBC: 4.16 MIL/uL — ABNORMAL LOW (ref 4.22–5.81)
RDW: 15.6 % — AB (ref 11.5–15.5)
WBC: 11.2 10*3/uL — AB (ref 4.0–10.5)

## 2016-06-13 LAB — COMPREHENSIVE METABOLIC PANEL
ALT: 11 U/L — ABNORMAL LOW (ref 17–63)
AST: 22 U/L (ref 15–41)
Albumin: 3.8 g/dL (ref 3.5–5.0)
Alkaline Phosphatase: 75 U/L (ref 38–126)
Anion gap: 6 (ref 5–15)
BUN: 34 mg/dL — AB (ref 6–20)
CALCIUM: 8.7 mg/dL — AB (ref 8.9–10.3)
CO2: 30 mmol/L (ref 22–32)
Chloride: 101 mmol/L (ref 101–111)
Creatinine, Ser: 1.37 mg/dL — ABNORMAL HIGH (ref 0.61–1.24)
GFR calc Af Amer: 60 mL/min — ABNORMAL LOW (ref >60)
GFR, EST NON AFRICAN AMERICAN: 51 mL/min — AB (ref >60)
GLUCOSE: 98 mg/dL (ref 65–99)
Potassium: 4.5 mmol/L (ref 3.5–5.1)
SODIUM: 137 mmol/L (ref 135–145)
TOTAL PROTEIN: 7.1 g/dL (ref 6.5–8.1)
Total Bilirubin: 0.5 mg/dL (ref 0.3–1.2)

## 2016-06-13 NOTE — Progress Notes (Signed)
CMP results in epic per PAT visit 06/13/2016 sent to Dr Diona Fanti

## 2016-06-13 NOTE — Patient Instructions (Signed)
CLINTIN VANDYKEN  06/13/2016   Your procedure is scheduled on: Monday June 16, 2016  Report to [facility] Main  Entrance take [place]  elevators to 3rd floor to  [place] at 7:30 AM.  Call this number if you have problems the morning of surgery [phone]   Remember: ONLY 1 PERSON MAY GO WITH YOU TO SHORT STAY TO GET  READY MORNING OF [place].  Do not eat food or drink liquids :After Midnight.     Take these medicines the morning of surgery with A SIP OF WATER: Carvedilol (Coreg); Citalopram (Celexa); May use Flonase Nasal Spray if needed; Loratadine (Claritin) if needed                             You may not have any metal on your body including hair pins and              piercings  Do not wear jewelry,  lotions, powders or colognes, deodorant                          Men may shave face and neck.   Do not bring valuables to the hospital. [place].  Contacts, dentures or bridgework may not be worn into surgery.       Patients discharged the day of surgery will not be allowed to drive home.  Name and phone number of your driver:Misty Attaway (daughter)  _____________________________________________________________________             [facility] - Preparing for Surgery Before surgery, you can play an important role.  Because skin is not sterile, your skin needs to be as free of germs as possible.  You can reduce the number of germs on your skin by washing with CHG (chlorahexidine gluconate) soap before surgery.  CHG is an antiseptic cleaner which kills germs and bonds with the skin to continue killing germs even after washing. Please DO NOT use if you have an allergy to CHG or antibacterial soaps.  If your skin becomes reddened/irritated stop using the CHG and inform your nurse when you arrive at Short Stay. Do not shave (including legs and underarms) for at least 48 hours prior to the first CHG  shower.  You may shave your face/neck. Please follow these instructions carefully:  1.  Shower with CHG Soap the night before surgery and the  morning of Surgery.  2.  If you choose to wash your hair, wash your hair first as usual with your  normal  shampoo.  3.  After you shampoo, rinse your hair and body thoroughly to remove the  shampoo.                           4.  Use CHG as you would any other liquid soap.  You can apply chg directly  to the skin and wash                       Gently with a scrungie or clean washcloth.  5.  Apply the CHG Soap to your body ONLY FROM THE NECK DOWN.   Do not use  on face/ open                           Wound or open sores. Avoid contact with eyes, ears mouth and genitals (private parts).                       Wash face,  Genitals (private parts) with your normal soap.             6.  Wash thoroughly, paying special attention to the area where your surgery  will be performed.  7.  Thoroughly rinse your body with warm water from the neck down.  8.  DO NOT shower/wash with your normal soap after using and rinsing off  the CHG Soap.                9.  Pat yourself dry with a clean towel.            10.  Wear clean pajamas.            11.  Place clean sheets on your bed the night of your first shower and do not  sleep with pets. Day of Surgery : Do not apply any lotions/deodorants the morning of surgery.  Please wear clean clothes to the hospital/surgery center.  FAILURE TO FOLLOW THESE INSTRUCTIONS MAY RESULT IN THE CANCELLATION OF YOUR SURGERY PATIENT SIGNATURE_________________________________  NURSE SIGNATURE__________________________________  ________________________________________________________________________

## 2016-06-16 ENCOUNTER — Ambulatory Visit (HOSPITAL_COMMUNITY): Payer: Medicare Other | Admitting: Certified Registered Nurse Anesthetist

## 2016-06-16 ENCOUNTER — Encounter (HOSPITAL_COMMUNITY)
Admission: RE | Disposition: A | Discharge status: Home / Self Care | Payer: Self-pay | Source: Ambulatory Visit | Attending: Urology

## 2016-06-16 ENCOUNTER — Ambulatory Visit (HOSPITAL_COMMUNITY)
Admission: RE | Admit: 2016-06-16 | Discharge: 2016-06-16 | Disposition: A | Discharge status: Home / Self Care | Payer: Medicare Other | Source: Ambulatory Visit | Attending: Urology | Admitting: Urology

## 2016-06-16 ENCOUNTER — Encounter (HOSPITAL_COMMUNITY): Payer: Self-pay | Admitting: *Deleted

## 2016-06-16 DIAGNOSIS — Z79899 Other long term (current) drug therapy: Secondary | ICD-10-CM | POA: Insufficient documentation

## 2016-06-16 DIAGNOSIS — F17210 Nicotine dependence, cigarettes, uncomplicated: Secondary | ICD-10-CM | POA: Insufficient documentation

## 2016-06-16 DIAGNOSIS — I110 Hypertensive heart disease with heart failure: Secondary | ICD-10-CM | POA: Insufficient documentation

## 2016-06-16 DIAGNOSIS — C670 Malignant neoplasm of trigone of bladder: Secondary | ICD-10-CM

## 2016-06-16 DIAGNOSIS — I509 Heart failure, unspecified: Secondary | ICD-10-CM | POA: Insufficient documentation

## 2016-06-16 DIAGNOSIS — C675 Malignant neoplasm of bladder neck: Secondary | ICD-10-CM | POA: Diagnosis not present

## 2016-06-16 DIAGNOSIS — N400 Enlarged prostate without lower urinary tract symptoms: Secondary | ICD-10-CM | POA: Insufficient documentation

## 2016-06-16 DIAGNOSIS — J449 Chronic obstructive pulmonary disease, unspecified: Secondary | ICD-10-CM | POA: Insufficient documentation

## 2016-06-16 HISTORY — PX: TRANSURETHRAL RESECTION OF BLADDER TUMOR: SHX2575

## 2016-06-16 HISTORY — PX: CYSTOSCOPY: SHX5120

## 2016-06-16 SURGERY — TURBT (TRANSURETHRAL RESECTION OF BLADDER TUMOR)
Anesthesia: Spinal

## 2016-06-16 MED ORDER — ROCURONIUM BROMIDE 100 MG/10ML IV SOLN
INTRAVENOUS | Status: AC
  Filled 2016-06-16: qty 1

## 2016-06-16 MED ORDER — ONDANSETRON HCL 4 MG/2ML IJ SOLN
INTRAMUSCULAR | Status: AC
  Filled 2016-06-16: qty 2

## 2016-06-16 MED ORDER — CEPHALEXIN 500 MG PO CAPS: 500.0000 mg | ORAL_CAPSULE | Freq: Two times a day (BID) | ORAL | Status: DC

## 2016-06-16 MED ORDER — MEPERIDINE HCL 50 MG/ML IJ SOLN: 6.2500 mg | INTRAMUSCULAR | Status: DC | PRN

## 2016-06-16 MED ORDER — PROPOFOL 500 MG/50ML IV EMUL
INTRAVENOUS | Status: DC | PRN
  Administered 2016-06-16: 25 ug/kg/min via INTRAVENOUS

## 2016-06-16 MED ORDER — LACTATED RINGERS IV SOLN
INTRAVENOUS | Status: DC
  Administered 2016-06-16: 09:00:00 via INTRAVENOUS

## 2016-06-16 MED ORDER — BUPIVACAINE IN DEXTROSE 0.75-8.25 % IT SOLN
INTRATHECAL | Status: DC | PRN
  Administered 2016-06-16: 2 mL via INTRATHECAL

## 2016-06-16 MED ORDER — CEFAZOLIN SODIUM-DEXTROSE 2-4 GM/100ML-% IV SOLN
2.0000 g | INTRAVENOUS | Status: AC
  Administered 2016-06-16: 2 g via INTRAVENOUS
  Filled 2016-06-16: qty 100

## 2016-06-16 MED ORDER — SODIUM CHLORIDE 0.9 % IR SOLN
Status: DC | PRN
  Administered 2016-06-16: 6000 mL

## 2016-06-16 MED ORDER — ONDANSETRON HCL 4 MG/2ML IJ SOLN: 4.0000 mg | Freq: Once | INTRAMUSCULAR | Status: DC | PRN

## 2016-06-16 MED ORDER — FENTANYL CITRATE (PF) 100 MCG/2ML IJ SOLN
INTRAMUSCULAR | Status: DC | PRN
  Administered 2016-06-16: 50 ug via INTRAVENOUS

## 2016-06-16 MED ORDER — PROMETHAZINE HCL 25 MG RE SUPP: 25.0000 mg | Freq: Four times a day (QID) | RECTAL | Status: DC | PRN

## 2016-06-16 MED ORDER — LIDOCAINE HCL (CARDIAC) 20 MG/ML IV SOLN
INTRAVENOUS | Status: AC
  Filled 2016-06-16: qty 5

## 2016-06-16 MED ORDER — EPHEDRINE SULFATE 50 MG/ML IJ SOLN
INTRAMUSCULAR | Status: DC | PRN
  Administered 2016-06-16: 10 mg via INTRAVENOUS

## 2016-06-16 MED ORDER — HYDROMORPHONE HCL 1 MG/ML IJ SOLN: 0.2500 mg | INTRAMUSCULAR | Status: DC | PRN

## 2016-06-16 MED ORDER — MIDAZOLAM HCL 2 MG/2ML IJ SOLN
INTRAMUSCULAR | Status: AC
  Filled 2016-06-16: qty 2

## 2016-06-16 MED ORDER — FENTANYL CITRATE (PF) 250 MCG/5ML IJ SOLN
INTRAMUSCULAR | Status: AC
  Filled 2016-06-16: qty 5

## 2016-06-16 MED ORDER — CEFAZOLIN SODIUM-DEXTROSE 2-4 GM/100ML-% IV SOLN
INTRAVENOUS | Status: AC
  Filled 2016-06-16: qty 100

## 2016-06-16 MED ORDER — PROPOFOL 10 MG/ML IV BOLUS
INTRAVENOUS | Status: DC | PRN
  Administered 2016-06-16: 20 mg via INTRAVENOUS

## 2016-06-16 MED ORDER — ONDANSETRON HCL 4 MG/2ML IJ SOLN
INTRAMUSCULAR | Status: DC | PRN
  Administered 2016-06-16: 4 mg via INTRAVENOUS

## 2016-06-16 MED ORDER — PROPOFOL 10 MG/ML IV BOLUS
INTRAVENOUS | Status: AC
  Filled 2016-06-16: qty 20

## 2016-06-16 MED ORDER — LIDOCAINE HCL (CARDIAC) 20 MG/ML IV SOLN
INTRAVENOUS | Status: DC | PRN
  Administered 2016-06-16: 30 mg via INTRAVENOUS

## 2016-06-16 SURGICAL SUPPLY — 26 items
BAG URINE DRAINAGE (UROLOGICAL SUPPLIES) ×2 IMPLANT
BAG URINE LEG 500ML (DRAIN) ×2 IMPLANT
BAG URO CATCHER STRL LF (MISCELLANEOUS) ×2 IMPLANT
CATH FOLEY 2WAY SLVR  5CC 20FR (CATHETERS) ×1
CATH FOLEY 2WAY SLVR 30CC 24FR (CATHETERS) IMPLANT
CATH FOLEY 2WAY SLVR 5CC 20FR (CATHETERS) ×1 IMPLANT
CATH INTERMIT  6FR 70CM (CATHETERS) IMPLANT
CLOTH BEACON ORANGE TIMEOUT ST (SAFETY) ×2 IMPLANT
ELECT REM PT RETURN 9FT ADLT (ELECTROSURGICAL) ×2
ELECTRODE REM PT RTRN 9FT ADLT (ELECTROSURGICAL) ×1 IMPLANT
EVACUATOR MICROVAS BLADDER (UROLOGICAL SUPPLIES) IMPLANT
GLOVE BIOGEL M 8.0 STRL (GLOVE) ×2 IMPLANT
GOWN STRL REUS W/ TWL XL LVL3 (GOWN DISPOSABLE) ×2 IMPLANT
GOWN STRL REUS W/TWL LRG LVL3 (GOWN DISPOSABLE) ×2 IMPLANT
GOWN STRL REUS W/TWL XL LVL3 (GOWN DISPOSABLE) ×4 IMPLANT
GUIDEWIRE ANG ZIPWIRE 038X150 (WIRE) IMPLANT
GUIDEWIRE STR DUAL SENSOR (WIRE) IMPLANT
LOOP CUT BIPOLAR 24F LRG (ELECTROSURGICAL) ×2 IMPLANT
MANIFOLD NEPTUNE II (INSTRUMENTS) ×2 IMPLANT
NDL SAFETY ECLIPSE 18X1.5 (NEEDLE) IMPLANT
NEEDLE HYPO 18GX1.5 SHARP (NEEDLE)
PACK CYSTO (CUSTOM PROCEDURE TRAY) ×2 IMPLANT
SET ASPIRATION TUBING (TUBING) IMPLANT
SYRINGE IRR TOOMEY STRL 70CC (SYRINGE) ×2 IMPLANT
TUBING CONNECTING 10 (TUBING) ×2 IMPLANT
WATER STERILE IRR 3000ML UROMA (IV SOLUTION) IMPLANT

## 2016-06-16 NOTE — Anesthesia Preprocedure Evaluation (Signed)
Anesthesia Evaluation  Patient identified by MRN, date of birth, ID band Patient awake    Reviewed: Allergy & Precautions, NPO status , Patient's Chart, lab work & pertinent test results  Airway Mallampati: I  TM Distance: >3 FB Neck ROM: Full    Dental   Pulmonary COPD, Current Smoker,    Pulmonary exam normal        Cardiovascular hypertension, Pt. on medications Normal cardiovascular exam     Neuro/Psych    GI/Hepatic   Endo/Other    Renal/GU      Musculoskeletal   Abdominal   Peds  Hematology   Anesthesia Other Findings   Reproductive/Obstetrics                             Anesthesia Physical Anesthesia Plan  ASA: III  Anesthesia Plan: Spinal   Post-op Pain Management:    Induction: Intravenous  Airway Management Planned: Natural Airway  Additional Equipment:   Intra-op Plan:   Post-operative Plan:   Informed Consent: I have reviewed the patients History and Physical, chart, labs and discussed the procedure including the risks, benefits and alternatives for the proposed anesthesia with the patient or authorized representative who has indicated his/her understanding and acceptance.     Plan Discussed with: CRNA and Surgeon  Anesthesia Plan Comments:         Anesthesia Quick Evaluation

## 2016-06-16 NOTE — H&P (Signed)
Urology History and Physical Exam  CC: Bladder cancer  HPI: 70 year old male presents for repeat TURBT. He has invasive urothelial cancer of the bladder., Initial TURBT was performed on 03/25/2016. He was found to have high grade muscle invasive cancer, and has consulted with Dr Tresa Moore of [facility] as well as Drs. Penland and Manning at the [facility] for consuieration of extirpative vs. bladder sparing therapy. He has chosen to have combination chemo/XRT. Prior to initiation, he will undergo repeat TURBT.  PMH: Past Medical History  Diagnosis Date  . Hypertension   . Depression   . CHF (congestive heart failure) ([place])   . COPD (chronic obstructive pulmonary disease) ([place])   . Hard of hearing     bilat   . Shortness of breath dyspnea     heat; walking up set of stairs; pt states can walk up 12-14 stairs slowly without having to stop to catch his breath  . Arthritis   . Cancer [facility])     Bladder Cancer    PSH: Past Surgical History  Procedure Laterality Date  . Transurethral resection of bladder tumor N/A 03/25/2016    Procedure: TRANSURETHRAL RESECTION OF BLADDER TUMOR (TURBT);  Surgeon: Franchot Gallo, MD;  Location: [facility];  Service: Urology;  Laterality: N/A;    Allergies: No Known Allergies  Medications: No prescriptions prior to admission     Social History: Social History   Social History  . Marital Status: Married    Spouse Name: N/A  . Number of Children: N/A  . Years of Education: N/A   Occupational History  . Not on file.   Social History Main Topics  . Smoking status: Current Every Day Smoker -- 2.00 packs/day for 50 years    Types: Cigarettes  . Smokeless tobacco: Never Used  . Alcohol Use: No     Comment: rarestop drinking several years ago; then begin to drink again for approx 3 wks but has currently stopped  . Drug Use: No  . Sexual Activity: No   Other Topics Concern  . Not on file   Social History Narrative    Family  History: No family history on file.  Review of Systems: Positive: SOB/COPD. Negative:   A further 10 point review of systems was negative except what is listed in the HPI.                  Physical Exam: @VITALS2 @ General: No acute distress.  Awake. Head:  Normocephalic.  Atraumatic. ENT:  EOMI.  Mucous membranes moist Neck:  Supple.  No lymphadenopathy. CV:  S1 present. S2 present. Regular rate. Pulmonary: Equal effort bilaterally.  Clear to auscultation bilaterally. Abdomen: Soft.  Non tender to palpation. Skin:  Normal turgor.  No visible rash. Extremity: No gross deformity of bilateral upper extremities.  No gross deformity of                             lower extremities. Neurologic: Alert. Appropriate mood.    Studies:  Recent Labs     06/13/16  0845  HGB  12.7*  WBC  11.2*  PLT  262    Recent Labs     06/13/16  0845  NA  137  K  4.5  CL  101  CO2  30  BUN  34*  CREATININE  1.37*  CALCIUM  8.7*  GFRNONAA  51*  GFRAA  60*  No results for input(s): INR, APTT in the last 72 hours.  Invalid input(s): PT   Invalid input(s): ABG    Assessment:  High grade muscle invasive bladder cancer  Plan: Repeat TURBT

## 2016-06-16 NOTE — Anesthesia Postprocedure Evaluation (Signed)
Anesthesia Post Note  Patient: Carl Murphy  Procedure(s) Performed: Procedure(s) (LRB): TRANSURETHRAL RESECTION OF BLADDER TUMOR (TURBT) (N/A) CYSTOSCOPY (N/A)  Patient location during evaluation: PACU Anesthesia Type: Spinal Level of consciousness: oriented and awake and alert Pain management: pain level controlled Vital Signs Assessment: post-procedure vital signs reviewed and stable Respiratory status: spontaneous breathing, respiratory function stable and patient connected to nasal cannula oxygen Cardiovascular status: blood pressure returned to baseline and stable Postop Assessment: no headache and no backache Anesthetic complications: no    Last Vitals:  Filed Vitals:   06/16/16 1151 06/16/16 1303  BP: 138/58 140/58  Pulse: 62 67  Temp: 36.8 C   Resp: 16 16    Last Pain: There were no vitals filed for this visit.               [place]

## 2016-06-16 NOTE — Anesthesia Procedure Notes (Addendum)
Procedure Name: MAC Date/Time: 06/16/2016 9:25 AM Performed by: West Pugh Pre-anesthesia Checklist: Patient identified, Emergency Drugs available, Suction available, Patient being monitored and Timeout performed Patient Re-evaluated:Patient Re-evaluated prior to inductionOxygen Delivery Method: Simple face mask Dental Injury: Teeth and Oropharynx as per pre-operative assessment    Spinal Patient location during procedure: OR Start time: 06/16/2016 9:25 AM End time: 06/16/2016 9:30 AM Reason for block: at surgeon's request Staffing Anesthesiologist: Lillia Abed Performed by: anesthesiologist  Preanesthetic Checklist Completed: patient identified, site marked, surgical consent, pre-op evaluation, timeout performed, IV checked, risks and benefits discussed, monitors and equipment checked and at surgeon's request Spinal Block Patient position: sitting Prep: Betadine Patient monitoring: continuous pulse ox, blood pressure and heart rate Approach: midline Location: L3-4 Injection technique: single-shot Needle Needle type: Pencan  Needle gauge: 24 G Needle length: 9 cm Assessment Sensory level: T4 Additional Notes Expiration of kit checked and confirmed. Patient tolerated procedure well,without complications x 1 attempt with noted clear CSF. Loss of motor and sensory on exam post injection.

## 2016-06-16 NOTE — Op Note (Signed)
Preoperative diagnosis: 1. High-grade, muscle invasive bladder cancer, scheduled for chemotherapy/radiotherapy as primary curative treatment  Postoperative diagnosis:  1. Same, with recurrence of urothelial carcinoma and right bladder neck area, 2.5 cm  Procedure:  1. Cystoscopy 2. Transurethral resection of bladder tumor (2.5 cm)  Surgeon: Franchot Gallo, M.D.  Anesthesia: General  Complications: None  Intraoperative findings:  1. Bladder tumor: 2.5 cm  EBL: Minimal  Specimens: 1. Bladder tumor  Disposition of specimens: Pathology  Indication: Mr. Character is a patient who underwent initial TURBT in late April, 2017. Pathology revealed high-grade, muscle invasive bladder cancer. Her to therapy has been discussed with the patient and his daughter at length. He has discussed cystectomy with Dr. Tresa Moore in our group, as well as combination radiotherapy/chemotherapy with Drs. Manning and Penland. The patient and his family have decided on bladder sparing chemotherapy/radiotherapy. Prior to initiating this, he is undergoing repeat cystoscopy and resection of bladder tumor base.. After reviewing the management options for treatment, he elected to proceed with the above surgical procedure(s). We have discussed the potential benefits and risks of the procedure, side effects of the proposed treatment, the likelihood of the patient achieving the goals of the procedure, and any potential problems that might occur during the procedure or recuperation. Informed consent has been obtained.  Description of procedure:  The patient was taken to the operating room and general anesthesia was induced.  The patient was placed in the dorsal lithotomy position, prepped and draped in the usual sterile fashion, and preoperative antibiotics were administered. A preoperative time-out was performed.   Cystourethroscopy was performed.  The patient's urethra was examined and  demonstrated bilobar prostatic  hypertrophy.  The bladder was then systematically examined in its entirety. The bladder was normal except for papillary bladder tumors in the right bladder neck area, 2 of these were present. The largest just over 2 cm in size. There was no nodularity associated with these. Ureteral orifices were normal. The rest of the bladder was inspected and found to be normal.  The bladder was then re-examined after the resectoscope was placed.   Using loop cautery resection, the entire tumor was resected and removed for permanent pathologic analysis.    Hemostasis was then achieved with the loop cautery and the bladder was emptied and reinspected with no further bleeding noted at the end of the procedure.    A 20 French Foley  foley catheter was placed. A leg bag was placed.  The bladder was then emptied and the procedure ended.  The patient appeared to tolerate the procedure well and without complications.  The patient was able to be awakened and transferred to the recovery unit in satisfactory condition.

## 2016-06-16 NOTE — Discharge Instructions (Signed)
Transurethral Resection of Bladder Tumor (TURBT)  Definition:  Transurethral Resection of the Bladder Tumor is a surgical procedure used to diagnose and remove tumors within the bladder. TURBT is the most common treatment for early stage bladder cancer.   General instructions:  Your recent bladder surgery requires very little post hospital care but some definite precautions.  Despite the fact that no skin incisions were used, the area around the tumor removal site is raw and covered with scabs to promote healing and prevent bleeding. Certain precautions are needed to insure that the scabs are not disturbed over the next 2-4 weeks while the healing proceeds.  Because the raw surface inside your bladder and the irritating effects of urine you may expect frequency of urination and/or urgency (a stronger desire to urinate) and perhaps even getting up at night more often. This will usually resolve or improve slowly over the healing period. You may see some blood in your urine over the first 6 weeks. Do not be alarmed, even if the urine was clear for a while. Get off your feet and drink lots of fluids until clearing occurs. If you start to pass clots or don't improve call us.   Remove catheter Tues morning as instructed  Diet:  You may return to your normal diet immediately. Because of the raw surface of your bladder, alcohol, spicy foods, foods high in acid and drinks with caffeine may cause irritation or frequency and should be used in moderation. To keep your urine flowing freely and avoid constipation, drink plenty of fluids during the day (8-10 glasses). Tip: Avoid cranberry juice because it is very acidic.   Activity:  Your physical activity needs to be restricted somewhat.  We suggest that you reduce your activity under the circumstances until the bleeding has stopped. Heavy lifting (greater than 20 lbs.) and heavy exertion should be limited for 2-3 weeks.  Bowels:  It is important to keep your  bowels regular during the postoperative period. Straining with bowel movements can cause bleeding. A bowel movement every other day is reasonable. Use a mild laxative if needed, such as milk of magnesia 2-3 tablespoons, or 2 Dulcolax tablets. Call if you continue to have problems. If you had been taking narcotics for pain, before, during or after your surgery, you may be constipated.   Medication:  You should resume your pre-surgery medications unless told not to. In addition you may be given an antibiotic to prevent or treat infection. Antibiotics are not always necessary. All medication should be taken as prescribed until the bottles are finished unless you are having an unusual reaction to one of the drugs.  General Anesthetic, Adult  A doctor specialized in giving anesthesia (anesthesiologist) or a nurse specialized in giving anesthesia (nurse anesthetist) gives medicine that makes you sleep while a procedure is performed (general anesthetic). Once the general anesthetic has been administered, you will be in a sleeplike state in which you feel no pain. After having a general anesthetic you may feel:  Dizzy.  Weak.  Drowsy.  Confused.  These feelings are normal and can be expected to last for up to 24 hours after the procedure.   LET YOUR CAREGIVER KNOW ABOUT:  Allergies you have.  Medications you are taking, including herbs, eye drops, over the counter medications, dietary supplements, and creams.  Previous problems you have had with anesthetics or numbing medicines.  Use of cigarettes, alcohol, or illicit drugs.  Possibility of pregnancy, if this applies.  History of bleeding or  blood disorders, including blood clots and clotting disorders.  Previous surgeries you have had and types of anesthetics you have received.  Family medical history, especially anesthetic problems.  Other health problems.   AFTER THE PROCEDURE  After surgery, you will be taken to the recovery area where a nurse  will monitor your progress. You will be allowed to go home when you are awake, stable, taking fluids well, and without serious pain or complications.  For the first 24 hours following an anesthetic:  Have a responsible person with you.  Do not drive a car. If you are alone, do not take public transportation.  Do not engage in strenuous activity. You may usually resume normal activities the next day, or as advised by your caregiver.  Do not drink alcohol.  Do not take medicine that has not been prescribed by your caregiver.  Do not sign important papers or make important decisions as your judgement may be impaired.  You may resume a normal diet as directed.  Change bandages (dressings) as directed.  Only take over-the-counter or prescription medicines for pain, discomfort, or fever as directed by your caregiver.  If you have questions or problems that seem related to the anesthetic, call the hospital and ask for the anesthetist, anesthesiologist, or anesthesia department.   SEEK IMMEDIATE MEDICAL CARE IF:  You develop a rash.  You have difficulty breathing.  You have chest pain.  You have allergic problems.  You have uncontrolled nausea.  You have uncontrolled vomiting.  You develop any serious bleeding, especially from the incision site.  Document Released: 02/24/2008 Document Revised: 07/30/2011 Document Reviewed: 03/20/2011  [facility] Patient Information 2012 [place].       Spinal Anesthesia , Care After Refer to this sheet in the next few weeks. These instructions provide you with information about caring for yourself after your procedure. Your health care provider may also give you more specific instructions. Your treatment has been planned according to current medical practices, but problems sometimes occur. Call your health care provider if you have any problems or questions after your procedure. WHAT TO EXPECT AFTER THE PROCEDURE After your procedure, it is typical to have  the following:  Sleepiness.  Nausea and vomiting. HOME CARE INSTRUCTIONS  For the first 24 hours after anesthetic medicine:  Do not drive or operate heavy machinery.  Do not drink alcohol.  Do not make important decisions.  Have someone stay with you for at least 24-48 hours.  Drink enough fluid to keep your urine clear or pale yellow. SEEK MEDICAL CARE IF:  You have nausea and vomiting that continue the day after anesthetic medicine was given.  You develop a rash. SEEK IMMEDIATE MEDICAL CARE IF:   You have a fever.  You have a persistent or severe headache.  You develop blurred or double vision.  You develop dizziness or lightheadedness.  You faint.  You have weakness, numbness, or tingling in your arms or legs.  You have difficulty breathing.  You are unable to pass urine.   This information is not intended to replace advice given to you by your health care provider. Make sure you discuss any questions you have with your health care provider.   Document Released: 02/07/2004 Document Revised: 12/08/2014 Document Reviewed: 06/28/2014 Elsevier Interactive Patient Education [employer].

## 2016-06-16 NOTE — Addendum Note (Signed)
Addendum  created 06/16/16 1733 by West Pugh, CRNA   Modules edited: Anesthesia Blocks and Procedures, Anesthesia Medication Administration, Clinical Notes   Clinical Notes:  File: [id number]

## 2016-06-16 NOTE — Transfer of Care (Signed)
Immediate Anesthesia Transfer of Care Note  Patient: Carl Murphy  Procedure(s) Performed: Procedure(s): TRANSURETHRAL RESECTION OF BLADDER TUMOR (TURBT) (N/A) CYSTOSCOPY (N/A)  Patient Location: PACU  Anesthesia Type:MAC and Spinal  Level of Consciousness:  sedated, patient cooperative and responds to stimulation  Airway & Oxygen Therapy:Patient Spontanous Breathing and Patient connected to face mask oxgen  Post-op Assessment:  Report given to PACU RN and Post -op Vital signs reviewed and stable  Post vital signs:  Reviewed and stable  Last Vitals:  Filed Vitals:   06/16/16 0730 06/16/16 1004  BP: 149/79 104/68  Pulse: 70 64  Temp: 36.9 C 36.5 C  Resp: 18 19    Complications: No apparent anesthesia complications

## 2016-06-17 ENCOUNTER — Inpatient Hospital Stay (HOSPITAL_COMMUNITY): Payer: Medicare Other

## 2016-06-17 ENCOUNTER — Ambulatory Visit (HOSPITAL_COMMUNITY): Payer: Medicare Other | Admitting: Oncology

## 2016-06-18 ENCOUNTER — Telehealth (HOSPITAL_COMMUNITY): Payer: Self-pay | Admitting: Emergency Medicine

## 2016-06-19 NOTE — Telephone Encounter (Signed)
error 

## 2016-06-23 ENCOUNTER — Inpatient Hospital Stay (HOSPITAL_COMMUNITY): Payer: Medicare Other

## 2016-06-24 ENCOUNTER — Inpatient Hospital Stay (HOSPITAL_COMMUNITY): Payer: Medicare Other

## 2016-06-25 ENCOUNTER — Other Ambulatory Visit (HOSPITAL_COMMUNITY): Payer: Self-pay | Admitting: Emergency Medicine

## 2016-06-25 ENCOUNTER — Ambulatory Visit (HOSPITAL_COMMUNITY): Payer: Medicare Other

## 2016-06-25 NOTE — Progress Notes (Unsigned)
Called pt to tell him when we made first chemotherapy appts

## 2016-07-01 ENCOUNTER — Inpatient Hospital Stay (HOSPITAL_COMMUNITY): Payer: Medicare Other

## 2016-07-08 ENCOUNTER — Inpatient Hospital Stay (HOSPITAL_COMMUNITY): Payer: Medicare Other

## 2016-07-15 ENCOUNTER — Inpatient Hospital Stay (HOSPITAL_COMMUNITY): Payer: Medicare Other

## 2016-07-21 ENCOUNTER — Other Ambulatory Visit (HOSPITAL_COMMUNITY): Payer: Self-pay | Admitting: Oncology

## 2016-07-21 ENCOUNTER — Ambulatory Visit (HOSPITAL_COMMUNITY): Payer: Medicare Other

## 2016-07-22 ENCOUNTER — Inpatient Hospital Stay (HOSPITAL_COMMUNITY): Payer: Medicare Other

## 2016-07-22 ENCOUNTER — Encounter (HOSPITAL_COMMUNITY): Payer: Medicare Other | Attending: Hematology & Oncology

## 2016-07-22 VITALS — BP 147/66 | HR 71 | Temp 98.0°F | Resp 20 | Wt 190.0 lb

## 2016-07-22 DIAGNOSIS — C670 Malignant neoplasm of trigone of bladder: Secondary | ICD-10-CM | POA: Insufficient documentation

## 2016-07-22 DIAGNOSIS — Z5111 Encounter for antineoplastic chemotherapy: Secondary | ICD-10-CM | POA: Diagnosis not present

## 2016-07-22 LAB — COMPREHENSIVE METABOLIC PANEL
ALK PHOS: 67 U/L (ref 38–126)
ALT: 11 U/L — AB (ref 17–63)
AST: 24 U/L (ref 15–41)
Albumin: 3.4 g/dL — ABNORMAL LOW (ref 3.5–5.0)
Anion gap: 8 (ref 5–15)
BUN: 26 mg/dL — AB (ref 6–20)
CALCIUM: 8.2 mg/dL — AB (ref 8.9–10.3)
CHLORIDE: 100 mmol/L — AB (ref 101–111)
CO2: 27 mmol/L (ref 22–32)
CREATININE: 1.56 mg/dL — AB (ref 0.61–1.24)
GFR calc Af Amer: 51 mL/min — ABNORMAL LOW (ref >60)
GFR calc non Af Amer: 44 mL/min — ABNORMAL LOW (ref >60)
GLUCOSE: 102 mg/dL — AB (ref 65–99)
Potassium: 3.9 mmol/L (ref 3.5–5.1)
SODIUM: 135 mmol/L (ref 135–145)
Total Bilirubin: 0.5 mg/dL (ref 0.3–1.2)
Total Protein: 6.6 g/dL (ref 6.5–8.1)

## 2016-07-22 LAB — CBC WITH DIFFERENTIAL/PLATELET
BASOS ABS: 0 10*3/uL (ref 0.0–0.1)
Basophils Relative: 0 %
EOS ABS: 0.1 10*3/uL (ref 0.0–0.7)
EOS PCT: 1 %
HCT: 39.9 % (ref 39.0–52.0)
HEMOGLOBIN: 13.3 g/dL (ref 13.0–17.0)
LYMPHS ABS: 1.2 10*3/uL (ref 0.7–4.0)
LYMPHS PCT: 14 %
MCH: 31.9 pg (ref 26.0–34.0)
MCHC: 33.3 g/dL (ref 30.0–36.0)
MCV: 95.7 fL (ref 78.0–100.0)
Monocytes Absolute: 0.7 10*3/uL (ref 0.1–1.0)
Monocytes Relative: 8 %
NEUTROS PCT: 77 %
Neutro Abs: 6.8 10*3/uL (ref 1.7–7.7)
PLATELETS: 246 10*3/uL (ref 150–400)
RBC: 4.17 MIL/uL — AB (ref 4.22–5.81)
RDW: 15.4 % (ref 11.5–15.5)
WBC: 8.7 10*3/uL (ref 4.0–10.5)

## 2016-07-22 MED ORDER — SODIUM CHLORIDE 0.9% FLUSH
10.0000 mL | INTRAVENOUS | Status: DC | PRN
  Administered 2016-07-22: 10 mL
  Filled 2016-07-22: qty 10

## 2016-07-22 MED ORDER — SODIUM CHLORIDE 0.9 % IV SOLN
160.6000 mg | Freq: Once | INTRAVENOUS | Status: AC
  Administered 2016-07-22: 160 mg via INTRAVENOUS
  Filled 2016-07-22: qty 16

## 2016-07-22 MED ORDER — PALONOSETRON HCL INJECTION 0.25 MG/5ML
0.2500 mg | Freq: Once | INTRAVENOUS | Status: AC
  Administered 2016-07-22: 0.25 mg via INTRAVENOUS
  Filled 2016-07-22: qty 5

## 2016-07-22 MED ORDER — SODIUM CHLORIDE 0.9 % IV SOLN
Freq: Once | INTRAVENOUS | Status: AC
Start: 2016-07-22 — End: 2016-07-22
  Administered 2016-07-22: 14:00:00 via INTRAVENOUS

## 2016-07-22 MED ORDER — SODIUM CHLORIDE 0.9 % IV SOLN
10.0000 mg | Freq: Once | INTRAVENOUS | Status: AC
  Administered 2016-07-22: 10 mg via INTRAVENOUS
  Filled 2016-07-22: qty 1

## 2016-07-22 NOTE — Patient Instructions (Signed)
[facility] Discharge Instructions for Patients Receiving Chemotherapy   Beginning January 23rd 2017 lab work for the Cancer Center will be done in the  Main lab at [facility] on 1st floor. If you have a lab appointment with the Cancer Center please come in thru the  Main Entrance and check in at the main information desk   Today you received the following chemotherapy agents:  Carboplatin  If you develop nausea and vomiting, or diarrhea that is not controlled by your medication, call the clinic.  The clinic phone number is [phone]. Office hours are Monday-Friday 8:30am-5:00pm.  BELOW ARE SYMPTOMS THAT SHOULD BE REPORTED IMMEDIATELY:  *FEVER GREATER THAN 101.0 F  *CHILLS WITH OR WITHOUT FEVER  NAUSEA AND VOMITING THAT IS NOT CONTROLLED WITH YOUR NAUSEA MEDICATION  *UNUSUAL SHORTNESS OF BREATH  *UNUSUAL BRUISING OR BLEEDING  TENDERNESS IN MOUTH AND THROAT WITH OR WITHOUT PRESENCE OF ULCERS  *URINARY PROBLEMS  *BOWEL PROBLEMS  UNUSUAL RASH Items with * indicate a potential emergency and should be followed up as soon as possible. If you have an emergency after office hours please contact your primary care physician or go to the nearest emergency department.  Please call the clinic during office hours if you have any questions or concerns.   You may also contact the Patient Navigator at [phone] should you have any questions or need assistance in obtaining follow up care.      Resources For Cancer Patients and their Caregivers ? American Cancer Society: Can assist with transportation, wigs, general needs, runs Look Good Feel Better.        1-888-227-6333 ? Cancer Care: Provides financial assistance, online support groups, medication/co-pay assistance.  1-800-813-HOPE (4673) ? [facility] Assists [place] cancer patients and their families through emotional , educational and financial support.   336-427-4357 ? [place] DSS Where to apply for food stamps, Medicaid and utility assistance. 336-342-1394 ? [facility]: Transportation to medical appointments. 336-347-2287 ? Social Security Administration: May apply for disability if have a Stage IV cancer. 336-342-7796 1-800-772-1213 ? [place] Aging, Disability and Transit Services: Assists with nutrition, care and transit needs. 336-349-2343         

## 2016-07-22 NOTE — Progress Notes (Signed)
Tolerated chemo well. Ambulatory on discharge home with grand-daughter.

## 2016-07-23 ENCOUNTER — Telehealth (HOSPITAL_COMMUNITY): Payer: Self-pay | Admitting: *Deleted

## 2016-07-24 NOTE — Telephone Encounter (Signed)
Called patient to follow-up after 1st carbo treatment. Left message on answering machine. Instruct patient if he is having any problems/questions/concerns he is to call the clinic.

## 2016-07-25 NOTE — Addendum Note (Signed)
Addended by: Berneta Levins on: 07/25/2016 02:56 PM   Modules accepted: Orders

## 2016-07-28 ENCOUNTER — Encounter (HOSPITAL_COMMUNITY): Payer: Medicare Other

## 2016-07-28 ENCOUNTER — Ambulatory Visit (HOSPITAL_COMMUNITY): Payer: Medicare Other | Admitting: Oncology

## 2016-07-28 ENCOUNTER — Other Ambulatory Visit (HOSPITAL_COMMUNITY): Payer: Medicare Other

## 2016-07-28 ENCOUNTER — Ambulatory Visit (HOSPITAL_COMMUNITY): Payer: Medicare Other

## 2016-07-28 ENCOUNTER — Encounter (HOSPITAL_COMMUNITY): Payer: Self-pay

## 2016-07-28 ENCOUNTER — Ambulatory Visit (HOSPITAL_COMMUNITY): Payer: Medicare Other | Admitting: Hematology & Oncology

## 2016-07-28 ENCOUNTER — Encounter (HOSPITAL_BASED_OUTPATIENT_CLINIC_OR_DEPARTMENT_OTHER): Payer: Medicare Other

## 2016-07-28 VITALS — BP 123/57 | HR 63 | Temp 97.9°F | Resp 18 | Wt 191.6 lb

## 2016-07-28 DIAGNOSIS — C670 Malignant neoplasm of trigone of bladder: Secondary | ICD-10-CM

## 2016-07-28 DIAGNOSIS — Z5111 Encounter for antineoplastic chemotherapy: Secondary | ICD-10-CM | POA: Diagnosis not present

## 2016-07-28 LAB — CBC WITH DIFFERENTIAL/PLATELET
BASOS ABS: 0 10*3/uL (ref 0.0–0.1)
BASOS PCT: 0 %
Eosinophils Absolute: 0.1 10*3/uL (ref 0.0–0.7)
Eosinophils Relative: 1 %
HEMATOCRIT: 38.5 % — AB (ref 39.0–52.0)
Hemoglobin: 12.8 g/dL — ABNORMAL LOW (ref 13.0–17.0)
Lymphocytes Relative: 14 %
Lymphs Abs: 1 10*3/uL (ref 0.7–4.0)
MCH: 32 pg (ref 26.0–34.0)
MCHC: 33.2 g/dL (ref 30.0–36.0)
MCV: 96.3 fL (ref 78.0–100.0)
MONO ABS: 0.8 10*3/uL (ref 0.1–1.0)
Monocytes Relative: 11 %
NEUTROS ABS: 5.6 10*3/uL (ref 1.7–7.7)
NEUTROS PCT: 74 %
PLATELETS: 208 10*3/uL (ref 150–400)
RBC: 4 MIL/uL — AB (ref 4.22–5.81)
RDW: 15.2 % (ref 11.5–15.5)
WBC: 7.6 10*3/uL (ref 4.0–10.5)

## 2016-07-28 LAB — COMPREHENSIVE METABOLIC PANEL
ALBUMIN: 3.5 g/dL (ref 3.5–5.0)
ALT: 11 U/L — AB (ref 17–63)
AST: 18 U/L (ref 15–41)
Alkaline Phosphatase: 61 U/L (ref 38–126)
Anion gap: 9 (ref 5–15)
BILIRUBIN TOTAL: 0.5 mg/dL (ref 0.3–1.2)
BUN: 29 mg/dL — AB (ref 6–20)
CHLORIDE: 101 mmol/L (ref 101–111)
CO2: 27 mmol/L (ref 22–32)
Calcium: 8.9 mg/dL (ref 8.9–10.3)
Creatinine, Ser: 1.37 mg/dL — ABNORMAL HIGH (ref 0.61–1.24)
GFR calc Af Amer: 60 mL/min — ABNORMAL LOW (ref >60)
GFR calc non Af Amer: 51 mL/min — ABNORMAL LOW (ref >60)
GLUCOSE: 95 mg/dL (ref 65–99)
POTASSIUM: 4.4 mmol/L (ref 3.5–5.1)
Sodium: 137 mmol/L (ref 135–145)
TOTAL PROTEIN: 6.6 g/dL (ref 6.5–8.1)

## 2016-07-28 MED ORDER — SODIUM CHLORIDE 0.9 % IV SOLN
176.0000 mg | Freq: Once | INTRAVENOUS | Status: AC
  Administered 2016-07-28: 180 mg via INTRAVENOUS
  Filled 2016-07-28: qty 18

## 2016-07-28 MED ORDER — PALONOSETRON HCL INJECTION 0.25 MG/5ML
0.2500 mg | Freq: Once | INTRAVENOUS | Status: AC
  Administered 2016-07-28: 0.25 mg via INTRAVENOUS
  Filled 2016-07-28: qty 5

## 2016-07-28 MED ORDER — SODIUM CHLORIDE 0.9 % IV SOLN
INTRAVENOUS | Status: DC
  Administered 2016-07-28: 15:00:00 via INTRAVENOUS

## 2016-07-28 MED ORDER — SODIUM CHLORIDE 0.9% FLUSH: 10.0000 mL | INTRAVENOUS | Status: DC | PRN

## 2016-07-28 MED ORDER — SODIUM CHLORIDE 0.9 % IV SOLN
10.0000 mg | Freq: Once | INTRAVENOUS | Status: AC
  Administered 2016-07-28: 10 mg via INTRAVENOUS
  Filled 2016-07-28: qty 1

## 2016-07-28 MED ORDER — HEPARIN SOD (PORK) LOCK FLUSH 100 UNIT/ML IV SOLN: 500.0000 [IU] | Freq: Once | INTRAVENOUS | Status: DC | PRN

## 2016-07-28 NOTE — Patient Instructions (Signed)
[facility] Discharge Instructions for Patients Receiving Chemotherapy   Beginning January 23rd 2017 lab work for the Cancer Center will be done in the  Main lab at [facility] on 1st floor. If you have a lab appointment with the Cancer Center please come in thru the  Main Entrance and check in at the main information desk   Today you received the following chemotherapy agents:  Carboplatin  If you develop nausea and vomiting, or diarrhea that is not controlled by your medication, call the clinic.  The clinic phone number is [phone]. Office hours are Monday-Friday 8:30am-5:00pm.  BELOW ARE SYMPTOMS THAT SHOULD BE REPORTED IMMEDIATELY:  *FEVER GREATER THAN 101.0 F  *CHILLS WITH OR WITHOUT FEVER  NAUSEA AND VOMITING THAT IS NOT CONTROLLED WITH YOUR NAUSEA MEDICATION  *UNUSUAL SHORTNESS OF BREATH  *UNUSUAL BRUISING OR BLEEDING  TENDERNESS IN MOUTH AND THROAT WITH OR WITHOUT PRESENCE OF ULCERS  *URINARY PROBLEMS  *BOWEL PROBLEMS  UNUSUAL RASH Items with * indicate a potential emergency and should be followed up as soon as possible. If you have an emergency after office hours please contact your primary care physician or go to the nearest emergency department.  Please call the clinic during office hours if you have any questions or concerns.   You may also contact the Patient Navigator at [phone] should you have any questions or need assistance in obtaining follow up care.      Resources For Cancer Patients and their Caregivers ? American Cancer Society: Can assist with transportation, wigs, general needs, runs Look Good Feel Better.        1-888-227-6333 ? Cancer Care: Provides financial assistance, online support groups, medication/co-pay assistance.  1-800-813-HOPE (4673) ? [facility] Assists [place] cancer patients and their families through emotional , educational and financial support.   336-427-4357 ? [place] DSS Where to apply for food stamps, Medicaid and utility assistance. 336-342-1394 ? [facility]: Transportation to medical appointments. 336-347-2287 ? Social Security Administration: May apply for disability if have a Stage IV cancer. 336-342-7796 1-800-772-1213 ? [place] [facility]ging, Disability and Transit Services: Assists with nutrition, care and transit needs. 336-349-2343         

## 2016-07-28 NOTE — Progress Notes (Signed)
Tolerated tx w/o adverse reaction.  Alert, in no distress.  VSS.  Discharged ambulatory. 

## 2016-07-29 ENCOUNTER — Encounter (HOSPITAL_COMMUNITY): Payer: Self-pay | Admitting: Hematology & Oncology

## 2016-07-29 ENCOUNTER — Ambulatory Visit (INDEPENDENT_AMBULATORY_CARE_PROVIDER_SITE_OTHER): Payer: Medicare Other | Admitting: Urology

## 2016-07-29 ENCOUNTER — Encounter (HOSPITAL_BASED_OUTPATIENT_CLINIC_OR_DEPARTMENT_OTHER): Payer: Medicare Other | Admitting: Hematology & Oncology

## 2016-07-29 ENCOUNTER — Inpatient Hospital Stay (HOSPITAL_COMMUNITY): Payer: Medicare Other

## 2016-07-29 VITALS — BP 142/61 | HR 84 | Temp 98.6°F | Resp 20 | Ht 69.0 in | Wt 191.6 lb

## 2016-07-29 DIAGNOSIS — Z720 Tobacco use: Secondary | ICD-10-CM | POA: Diagnosis not present

## 2016-07-29 DIAGNOSIS — N183 Chronic kidney disease, stage 3 unspecified: Secondary | ICD-10-CM

## 2016-07-29 DIAGNOSIS — C670 Malignant neoplasm of trigone of bladder: Secondary | ICD-10-CM

## 2016-07-29 DIAGNOSIS — D630 Anemia in neoplastic disease: Secondary | ICD-10-CM | POA: Diagnosis not present

## 2016-07-29 NOTE — Progress Notes (Signed)
[name]  PROGRESS NOTE  Patient Care Team: [name], MD as PCP - General (Internal Medicine)  CHIEF COMPLAINTS/PURPOSE OF CONSULTATION:   Oncology History   Per available records had bladder tumor removed but had extension into the muscularis propria. CT showed no extension beyond the bladder. Has seen 2 urologists in consultation. Adamant about not having a cystectomy and ileal conduit.     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 No findings for local spread of disease or metastatic disease      03/25/2016 Surgery    TURBT 3cm right trigonal bladder tumor. followed by intravesical epirubicin instillation      03/25/2016 Pathology Results    bladder TURBT infiltrative high grade papillary urothelial carcinoma, invades the mulcularis propria (detrusor) no LVI       04/14/2016 Miscellaneous    [name] consultation in [place] for consideration of cystectomy. patient also offered repeat resection with BCG, and CHEMO/XRT      06/17/2016 Pathology Results    TURB- high grade papillary urothelial carcinoma with focal stromal invasion      COPD Hypertensive cardiomyopathy  HISTORY OF PRESENTING ILLNESS:  Carl Murphy 70 y.o. male is here for ongoing follow-up of muscle invasive carcinoma of the bladder.   Carl Murphy is unaccompanied.   States he is doing fine, feeling good and better than before he started taking chemotherapy. He is currently undergoing radiation therapy.  He is eating well. He continues to smoke.   He only takes his anti-nausea pill before coming in for chemotherapy. He has never experienced nausea with treatment but takes it to be sure he doesn't. He understands that he receives nausea medication with each treatment thru his IV.  Reports he passed two clots in his urine about 10 days ago, which scared him. He experienced some  dysuria while passing these clots. He had an appointment with Dr. Willey Blade two days later and spoke with him about this. He has not passed any other clots since. He denies any other urinary issues.    MEDICAL HISTORY:  Past Medical History:  Diagnosis Date  . Arthritis   . Cancer [facility])    Bladder Cancer  . CHF (congestive heart failure) ([place])   . COPD (chronic obstructive pulmonary disease) ([place])   . Depression   . Hard of hearing    bilat   . Hypertension   . Shortness of breath dyspnea    heat; walking up set of stairs; pt states can walk up 12-14 stairs slowly without having to stop to catch his breath    SURGICAL HISTORY: Past Surgical History:  Procedure Laterality Date  . CYSTOSCOPY N/A 06/16/2016   Procedure: CYSTOSCOPY;  Surgeon: Franchot Gallo, MD;  Location: [facility];  Service: Urology;  Laterality: N/A;  . TRANSURETHRAL RESECTION OF BLADDER TUMOR N/A 03/25/2016   Procedure: TRANSURETHRAL RESECTION OF BLADDER TUMOR (TURBT);  Surgeon: Franchot Gallo, MD;  Location: [facility];  Service: Urology;  Laterality: N/A;  . TRANSURETHRAL RESECTION OF BLADDER TUMOR N/A 06/16/2016   Procedure: TRANSURETHRAL RESECTION OF BLADDER TUMOR (TURBT);  Surgeon: Franchot Gallo, MD;  Location: [facility];  Service: Urology;  Laterality: N/A;    SOCIAL HISTORY: Social History   Social History  . Marital status: Married    Spouse name: N/A  . Number of children: N/A  . Years of education: N/A  Occupational History  . Not on file.   Social History Main Topics  . Smoking status: Current Every Day Smoker    Packs/day: 2.00    Years: 50.00    Types: Cigarettes  . Smokeless tobacco: Never Used  . Alcohol use No     Comment: rarestop drinking several years ago; then begin to drink again for approx 3 wks but has currently stopped  . Drug use: No  . Sexual activity: No   Other Topics Concern  . Not on file   Social History Narrative  . 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 Hypertension Mother   . Asthma Father   . Emphysema Father     Both parents deceased Mom was early 11's when she died; he was around 80-something Dad had asthma and emphysema really bad; thinks his heart finally gave out He says his mother's "intestines broke" and it was "fast forward downhill from there."  He is an only child.   ALLERGIES:  has No Known Allergies.  MEDICATIONS:  Current Outpatient Prescriptions  Medication Sig Dispense Refill  . buPROPion (WELLBUTRIN XL) 150 MG 24 hr tablet   2  . carvedilol (COREG) 25 MG tablet Take 1 tablet by mouth 2 (two) times daily.  11  . citalopram (CELEXA) 20 MG tablet Take 20 mg by mouth at bedtime.   11  . loratadine (CLARITIN) 10 MG tablet Take 10 mg by mouth daily as needed for allergies.    Marland Kitchen losartan-hydrochlorothiazide (HYZAAR) 100-25 MG tablet Take 1 tablet by mouth at bedtime.   11  . ondansetron (ZOFRAN) 8 MG tablet Take 1 tablet (8 mg total) by mouth every 8 (eight) hours as needed for nausea or vomiting. 30 tablet 2  . prochlorperazine (COMPAZINE) 10 MG tablet Take 1 tablet (10 mg total) by mouth every 6 (six) hours as needed for nausea or vomiting. 30 tablet 2  . promethazine (PHENERGAN) 25 MG suppository Place 1 suppository (25 mg total) rectally every 6 (six) hours as needed for nausea or vomiting. (Patient not taking: Reported on 07/29/2016) 5 each 0   No current facility-administered medications for this visit.     Review of  Systems  Constitutional: Negative.  Negative for chills, diaphoresis, fever, malaise/fatigue and weight loss.  HENT: Negative.  Negative for congestion, ear discharge, ear pain, hearing loss, nosebleeds, sore throat and tinnitus.   Eyes: Negative.  Negative for blurred vision, double vision, photophobia, pain, discharge and redness.  Respiratory: Positive for cough and shortness of breath. Negative for hemoptysis, wheezing and stridor.        Says he has a smoker's cough.  Cardiovascular: Negative.  Negative for chest pain, palpitations, orthopnea, claudication, leg swelling and PND.  Gastrointestinal: Negative.  Negative for abdominal pain, blood in stool, constipation, diarrhea, heartburn, melena, nausea and vomiting.  Genitourinary: Positive for dysuria and hematuria. Negative for flank pain, frequency and urgency.       Passed two  clots by urine about 10 days ago. Experienced dysuria with the clots passing. He has had no episodes of passing clots since.  Musculoskeletal: Positive for joint pain. Negative for back pain, falls, myalgias and neck pain.  Skin: Negative.  Negative for itching and rash.  Neurological: Negative.  Negative for dizziness, tingling, tremors, sensory change, speech change, focal weakness, seizures, loss of consciousness, weakness and headaches.  Endo/Heme/Allergies: Negative.  Negative for environmental allergies and polydipsia. Does not bruise/bleed easily.  Psychiatric/Behavioral: Negative.  Negative for depression, hallucinations, memory loss, substance abuse and suicidal ideas. The patient is not nervous/anxious and does not have insomnia.   All other systems reviewed and are negative. 14 point ROS was done and is otherwise as detailed above or in HPI    PHYSICAL EXAMINATION: ECOG PERFORMANCE STATUS: 1 - Symptomatic but completely ambulatory  Vitals:   07/29/16 0838  BP: (!) 142/61  Pulse: 84  Resp: 20  Temp: 98.6 F (37 C)   Filed Weights   07/29/16  0838  Weight: 191 lb 9.6 oz (86.9 kg)    Physical Exam  Constitutional: He is oriented to person, place, and time. No distress.  HENT:  Head: Normocephalic and atraumatic.  Mouth/Throat: Oropharynx is clear and moist. No oropharyngeal exudate.  Eyes: Conjunctivae and EOM are normal. Pupils are equal, round, and reactive to light. Right eye exhibits no discharge. Left eye exhibits no discharge. No scleral icterus.  Neck: Normal range of motion. Neck supple. No tracheal deviation present. No thyromegaly present.  Cardiovascular: Normal rate, regular rhythm and normal heart sounds.   No murmur heard. Pulmonary/Chest: Effort normal. No respiratory distress. He has wheezes.  Abdominal: Soft. Bowel sounds are normal. He exhibits no distension and no mass. There is no tenderness. There is no rebound and no guarding.  Umbilical hernia present.  Musculoskeletal: Normal range of motion. He exhibits no edema or tenderness.  Lymphadenopathy:    He has no cervical adenopathy.  Neurological: He is alert and oriented to person, place, and time. No cranial nerve deficit. Gait normal. Coordination normal.  Skin: Skin is warm and dry. No rash noted. He is not diaphoretic. No erythema.  Psychiatric: Mood, memory, affect and judgment normal.  Nursing note and vitals reviewed.   LABORATORY DATA:  I have reviewed the data as listed Lab Results  Component Value Date   WBC 7.6 07/28/2016   HGB 12.8 (L) 07/28/2016   HCT 38.5 (L) 07/28/2016   MCV 96.3 07/28/2016   PLT 208 07/28/2016   CMP     Component Value Date/Time   NA 137 07/28/2016 1251   K 4.4 07/28/2016 1251   CL 101 07/28/2016 1251   CO2 27 07/28/2016 1251   GLUCOSE 95 07/28/2016 1251   BUN 29 (H) 07/28/2016 1251   CREATININE 1.37 (H) 07/28/2016 1251   CALCIUM 8.9 07/28/2016 1251   PROT 6.6 07/28/2016 1251   ALBUMIN 3.5 07/28/2016 1251   AST 18 07/28/2016 1251   ALT 11 (L) 07/28/2016 1251   ALKPHOS 61 07/28/2016 1251   BILITOT 0.5  07/28/2016 1251   GFRNONAA 51 (L) 07/28/2016 1251   GFRAA 60 (L) 07/28/2016 1251     RADIOGRAPHIC STUDIES: I have personally reviewed the radiological images as listed and agreed with the findings in the report. CLINICAL DATA: Hematuria and frequent urination.  EXAM: CT ABDOMEN AND PELVIS WITHOUT AND WITH CONTRAST  TECHNIQUE: Multidetector CT imaging of the abdomen and pelvis was performed following the standard protocol before and following  the bolus administration of intravenous contrast.  CONTRAST: 123mL OMNIPAQUE IOHEXOL 300 MG/ML SOLN  COMPARISON: CT scan 03/31/2013  FINDINGS: Lower chest: The lung bases are clear of acute process. No worrisome pulmonary lesions. No pleural effusion. The heart is normal in size. No pericardial effusion. Coronary artery calcifications are noted. Dense distal aortic calcifications are noted. The esophagus is grossly normal.  Hepatobiliary: Small low-attenuation liver lesions are stable and likely small cysts. Lesion and segment 8 is also stable and likely a hemangioma. No worrisome hepatic lesions or intrahepatic biliary dilatation. The gallbladder is normal. No common bile duct dilatation.  Pancreas: No mass, inflammation or ductal dilatation.  Spleen: Normal size. No focal lesions.  Adrenals/Urinary Tract: The adrenal glands are normal and stable.  Numerous left renal artery calcifications. I do not see any definite calculi in the left collecting system. Small left renal cysts are noted. The no right renal calculi or hydronephrosis. Both ureters are normal. No ureteral calculi. No significant collecting system abnormality on the delayed images.  There is a large irregular bladder mass at on the right side. This demonstrates moderate contrast enhancement. It measures 3.2 x 3.0 cm. No ureteral obstruction.  Stomach/Bowel: The stomach, duodenum, small bowel and colon are unremarkable without oral contrast. No  inflammatory changes, mass lesions or obstructive findings. The terminal ileum is normal. The appendix is normal. Moderate sigmoid diverticulosis without findings for acute diverticulitis.  Vascular/Lymphatic: Advanced aortic and branch vessel atherosclerotic calcifications. No focal aneurysm or dissection. The major venous structures are patent.  No mesenteric or retroperitoneal mass or lymphadenopathy. Small scattered lymph nodes are noted. Small bilateral external iliac artery lymph nodes are stable. No pelvic lymphadenopathy. No inguinal adenopathy. The prostate gland and seminal vesicles are grossly normal. New line small  Small Periumbilical abdominal wall hernia containing fat and a small bowel loop. This has enlarged since prior CT scan.  Other: No subcutaneous lesions or ascites.  Musculoskeletal: No significant bony findings.  IMPRESSION: 1. 3.2 x 3.0 cm bladder mass consistent with transitional cell carcinoma. No findings for local spread of disease or metastatic disease. 2. No worrisome renal or ureteral lesions. 3. Small benign-appearing and stable hepatic lesions. 4. Advanced atherosclerotic calcifications involving the aorta and branch vessels. 5. Small periumbilical abdominal wall hernia containing fat and a small bowel loop.   Electronically Signed  By: Marijo Sanes M.D.  On: 02/18/2016 16:47   ASSESSMENT & PLAN:  Muscle invasive carcinoma of the trigone of the bladder Tobacco Abuse CKD Anemia  He will continue with weekly carboplatin and concurrent XRT. Has done well so far. He will return next week for labs and ongoing therapy.   I addressed the importance of smoking cessation with the patient in detail.  We discussed the health benefits of cessation.  We discussed the health detriments of ongoing tobacco use including but not limited to COPD, heart disease and malignancy. We reviewed the multiple options for cessation and I offered to  refer him to smoking cessation classes. We discussed other alternatives to quit such as chantix, wellbutrin. We will continue to address this moving forward. He is very straightforward in regards to his unwillingness to quit.   He does not need any refills at this time.  He will return for follow up with his next cycle of therapy. (we will try to get his follow up appointments on same day of treatment)  All questions were answered. The patient knows to call the clinic with any problems, questions or concerns.  This document serves as a record of services personally performed by Ancil Linsey, MD. It was created on her behalf by Elmyra Ricks, a trained medical scribe. The creation of this record is based on the scribe's personal observations and the provider's statements to them. This document has been checked and approved by the attending provider.  I have reviewed the above documentation for accuracy and completeness and I agree with the above.  This note was electronically signed.    Molli Hazard, MD  07/29/2016 8:55 AM

## 2016-07-29 NOTE — Patient Instructions (Addendum)
[place] at [facility] Discharge Instructions  RECOMMENDATIONS MADE BY THE CONSULTANT AND ANY TEST RESULTS WILL BE SENT TO YOUR REFERRING PHYSICIAN.  You saw Dr. Whitney Muse today. Continue weekly labs and chemo. Follow up with MD in 2 weeks.  Thank you for choosing [place] at [facility] to provide your oncology and hematology care.  To afford each patient quality time with our provider, please arrive at least 15 minutes before your scheduled appointment time.   Beginning January 23rd 2017 lab work for the [facility] will be done in the  Main lab at [facility] on 1st floor. If you have a lab appointment with the [place] please come in thru the  Main Entrance and check in at the main information desk  You need to re-schedule your appointment should you arrive 10 or more minutes late.  We strive to give you quality time with our providers, and arriving late affects you and other patients whose appointments are after yours.  Also, if you no show three or more times for appointments you may be dismissed from the clinic at the providers discretion.     Again, thank you for choosing [facility].  Our hope is that these requests will decrease the amount of time that you wait before being seen by our physicians.       _____________________________________________________________  Should you have questions after your visit to [facility], please contact our office at [phone] between the hours of 8:30 a.m. and 4:30 p.m.  Voicemails left after 4:30 p.m. will not be returned until the following business day.  For prescription refill requests, have your pharmacy contact our office.         Resources For Cancer Patients and their Caregivers ? American Cancer Society: Can assist with transportation, wigs, general needs, runs Look Good Feel Better.        ([phone] ? Cancer Care: Provides financial  assistance, online support groups, medication/co-pay assistance.  1-800-813-HOPE (936)777-5945) ? [facility] Assists [place] cancer patients and their families through emotional , educational and financial support.  940-266-5785 ? [place] DSS Where to apply for food stamps, Medicaid and utility assistance. 601 445 5852 ? [facility]: Transportation to medical appointments. 332-536-1610 ? Social Security Administration: May apply for disability if have a Stage IV cancer. 612-005-3295 938-375-7916 ? [employer], Disability and Transit Services: Assists with nutrition, care and transit needs. [place] Support Programs: @10RELATIVEDAYS @ > Cancer Support Group  2nd Tuesday of the month 1pm-2pm, Journey Room  > Creative Journey  3rd Tuesday of the month 1130am-1pm, Journey Room  > Look Good Feel Better  1st Wednesday of the month 10am-12 noon, Journey Room (Call [place] to register [phone])

## 2016-08-05 ENCOUNTER — Encounter (HOSPITAL_COMMUNITY): Payer: Self-pay | Admitting: Oncology

## 2016-08-05 ENCOUNTER — Other Ambulatory Visit (HOSPITAL_COMMUNITY): Payer: Medicare Other

## 2016-08-05 ENCOUNTER — Encounter (HOSPITAL_COMMUNITY): Payer: Medicare Other

## 2016-08-05 ENCOUNTER — Encounter (HOSPITAL_COMMUNITY): Payer: Medicare Other | Attending: Hematology & Oncology

## 2016-08-05 ENCOUNTER — Ambulatory Visit (HOSPITAL_COMMUNITY): Payer: Medicare Other | Admitting: Hematology & Oncology

## 2016-08-05 ENCOUNTER — Encounter (HOSPITAL_BASED_OUTPATIENT_CLINIC_OR_DEPARTMENT_OTHER): Payer: Medicare Other | Admitting: Oncology

## 2016-08-05 ENCOUNTER — Ambulatory Visit (HOSPITAL_COMMUNITY): Payer: Medicare Other

## 2016-08-05 VITALS — BP 112/62 | HR 66 | Temp 98.6°F | Resp 18 | Wt 189.4 lb

## 2016-08-05 DIAGNOSIS — Z5111 Encounter for antineoplastic chemotherapy: Secondary | ICD-10-CM | POA: Diagnosis not present

## 2016-08-05 DIAGNOSIS — Z720 Tobacco use: Secondary | ICD-10-CM

## 2016-08-05 DIAGNOSIS — C670 Malignant neoplasm of trigone of bladder: Secondary | ICD-10-CM

## 2016-08-05 LAB — CBC WITH DIFFERENTIAL/PLATELET
Basophils Absolute: 0 10*3/uL (ref 0.0–0.1)
Basophils Relative: 0 %
Eosinophils Absolute: 0.2 10*3/uL (ref 0.0–0.7)
Eosinophils Relative: 3 %
HCT: 37.6 % — ABNORMAL LOW (ref 39.0–52.0)
Hemoglobin: 12.5 g/dL — ABNORMAL LOW (ref 13.0–17.0)
Lymphocytes Relative: 15 %
Lymphs Abs: 0.8 10*3/uL (ref 0.7–4.0)
MCH: 31.9 pg (ref 26.0–34.0)
MCHC: 33.2 g/dL (ref 30.0–36.0)
MCV: 95.9 fL (ref 78.0–100.0)
Monocytes Absolute: 0.5 10*3/uL (ref 0.1–1.0)
Monocytes Relative: 9 %
Neutro Abs: 3.8 10*3/uL (ref 1.7–7.7)
Neutrophils Relative %: 73 %
Platelets: 205 10*3/uL (ref 150–400)
RBC: 3.92 MIL/uL — ABNORMAL LOW (ref 4.22–5.81)
RDW: 15.2 % (ref 11.5–15.5)
WBC: 5.2 10*3/uL (ref 4.0–10.5)

## 2016-08-05 LAB — COMPREHENSIVE METABOLIC PANEL
ALBUMIN: 3.4 g/dL — AB (ref 3.5–5.0)
ALT: 11 U/L — ABNORMAL LOW (ref 17–63)
ANION GAP: 8 (ref 5–15)
AST: 20 U/L (ref 15–41)
Alkaline Phosphatase: 58 U/L (ref 38–126)
BUN: 29 mg/dL — AB (ref 6–20)
CHLORIDE: 100 mmol/L — AB (ref 101–111)
CO2: 28 mmol/L (ref 22–32)
Calcium: 8.3 mg/dL — ABNORMAL LOW (ref 8.9–10.3)
Creatinine, Ser: 1.39 mg/dL — ABNORMAL HIGH (ref 0.61–1.24)
GFR calc Af Amer: 59 mL/min — ABNORMAL LOW (ref >60)
GFR calc non Af Amer: 51 mL/min — ABNORMAL LOW (ref >60)
GLUCOSE: 98 mg/dL (ref 65–99)
POTASSIUM: 3.9 mmol/L (ref 3.5–5.1)
SODIUM: 136 mmol/L (ref 135–145)
Total Bilirubin: 0.6 mg/dL (ref 0.3–1.2)
Total Protein: 6.7 g/dL (ref 6.5–8.1)

## 2016-08-05 MED ORDER — PALONOSETRON HCL INJECTION 0.25 MG/5ML
INTRAVENOUS | Status: AC
  Filled 2016-08-05: qty 5

## 2016-08-05 MED ORDER — SODIUM CHLORIDE 0.9 % IV SOLN
10.0000 mg | Freq: Once | INTRAVENOUS | Status: AC
  Administered 2016-08-05: 10 mg via INTRAVENOUS
  Filled 2016-08-05: qty 1

## 2016-08-05 MED ORDER — SODIUM CHLORIDE 0.9 % IV SOLN
170.0000 mg | Freq: Once | INTRAVENOUS | Status: AC
  Administered 2016-08-05: 170 mg via INTRAVENOUS
  Filled 2016-08-05: qty 17

## 2016-08-05 MED ORDER — SODIUM CHLORIDE 0.9 % IV SOLN
Freq: Once | INTRAVENOUS | Status: AC
  Administered 2016-08-05: 14:00:00 via INTRAVENOUS

## 2016-08-05 MED ORDER — PALONOSETRON HCL INJECTION 0.25 MG/5ML
0.2500 mg | Freq: Once | INTRAVENOUS | Status: AC
  Administered 2016-08-05: 0.25 mg via INTRAVENOUS

## 2016-08-05 NOTE — Progress Notes (Signed)
[name], MD [address] [place] [address]  Malignant neoplasm of trigone of bladder [facility])  CURRENT THERAPY:  Weekly Carboplatin with XRT beginning on 07/22/2016.  INTERVAL HISTORY: Carl Murphy 70 y.o. male returns for followup of muscle invasive carcinoma of the bladder.   Oncology History   Per available records had bladder tumor removed but had extension into the muscularis propria. CT showed no extension beyond the bladder. Has seen 2 urologists in consultation. Adamant about not having a cystectomy and ileal conduit.     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 No findings for local spread of disease or metastatic disease      03/25/2016 Surgery    TURBT 3cm right trigonal bladder tumor. followed by intravesical epirubicin instillation      03/25/2016 Pathology Results    bladder TURBT infiltrative high grade papillary urothelial carcinoma, invades the mulcularis propria (detrusor) no LVI       04/14/2016 Miscellaneous    [name] consultation in [place] for consideration of cystectomy. patient also offered repeat resection with BCG, and CHEMO/XRT      06/17/2016 Pathology Results    TURB- high grade papillary urothelial carcinoma with focal stromal invasion      07/22/2016 -  Chemotherapy    The patient had palonosetron (ALOXI) injection 0.25 mg, 0.25 mg, Intravenous,  Once, 2 of 5 cycles Administration: 0.25 mg (07/28/2016)  CARBOplatin (PARAPLATIN) 160 mg in sodium chloride 0.9 % 100 mL chemo infusion, 160 mg (100 % of original dose 160.6 mg), Intravenous,  Once, 2 of 5 cycles Dose modification:   (original dose 160.6 mg, Cycle 1),   (original dose 160.6 mg, Cycle 1),   (original dose 160.6 mg, Cycle 1),   (original dose 160.6 mg, Cycle 2) Administration: 180 mg (07/28/2016)  for chemotherapy treatment.         Radiation Therapy    The patient saw No care team member to display for radiation treatment. This is the current list of radiation treatment: Radiation Treatments       Patient's record has no active or historical radiation treatments documented.              He denies any complaints with regards to treatment.  He is tolerating well.  He denies any nausea/vomiting, cough, fatigue, tiredness, constipation, diarrhea.  Review of Systems  Constitutional: Negative.  Negative for chills, fever and weight loss.  HENT: Negative.   Eyes: Negative.   Respiratory: Negative.   Cardiovascular: Negative.   Gastrointestinal: Negative.   Genitourinary: Negative.   Musculoskeletal: Negative.   Skin: Negative.   Neurological: Negative.   Endo/Heme/Allergies: Negative.   Psychiatric/Behavioral: Negative.     Past Medical History:  Diagnosis Date  . Arthritis   . Cancer [facility]    Bladder Cancer  . CHF (congestive heart failure) ([place])   . COPD (chronic obstructive pulmonary disease) ([place])   . Depression   . Hard of hearing    bilat   . Hypertension   . Shortness of breath dyspnea    heat; walking up set of stairs; pt states can walk up 12-14 stairs slowly without having to stop to catch his breath    Past Surgical History:  Procedure Laterality Date  . CYSTOSCOPY N/A 06/16/2016   Procedure: CYSTOSCOPY;  Surgeon: Franchot Gallo, MD;  Location: [facility];  Service: Urology;  Laterality: N/A;  . TRANSURETHRAL RESECTION OF BLADDER TUMOR N/A 03/25/2016   Procedure: TRANSURETHRAL RESECTION OF BLADDER TUMOR (TURBT);  Surgeon: Franchot Gallo, MD;  Location: [facility];  Service: Urology;  Laterality: N/A;  . TRANSURETHRAL RESECTION OF BLADDER TUMOR N/A 06/16/2016   Procedure: TRANSURETHRAL RESECTION OF BLADDER TUMOR (TURBT);  Surgeon: Franchot Gallo, MD;  Location: [facility];  Service: Urology;  Laterality: N/A;    Family History  Problem Relation Age of Onset  . Hypertension Mother   . Asthma Father   .  Emphysema Father     Social History   Social History  . Marital status: Married    Spouse name: N/A  . Number of children: N/A  . Years of education: N/A   Social History Main Topics  . Smoking status: Current Every Day Smoker    Packs/day: 2.00    Years: 50.00    Types: Cigarettes  . Smokeless tobacco: Never Used  . Alcohol use No     Comment: rarestop drinking several years ago; then begin to drink again for approx 3 wks but has currently stopped  . Drug use: No  . Sexual activity: No   Other Topics Concern  . None   Social History Narrative  . None     PHYSICAL EXAMINATION  ECOG PERFORMANCE STATUS: 0 - Asymptomatic  There were no vitals filed for this visit.  BP 115/58 P 77 R 18 T 98.4 O2 95%  GENERAL:alert, no distress, well nourished, well developed, comfortable, cooperative, obese, smiling and unaccompanied in chemo-recliner SKIN: skin color, texture, turgor are normal, no rashes or significant lesions HEAD: Normocephalic, No masses, lesions, tenderness or abnormalities EYES: normal, EOMI, Conjunctiva are pink and non-injected EARS: External ears normal OROPHARYNX:lips, buccal mucosa, and tongue normal and mucous membranes are moist  NECK: supple, trachea midline LYMPH:  no palpable lymphadenopathy BREAST:not examined LUNGS: clear to auscultation and percussion HEART: regular rate & rhythm, no murmurs, no gallops, S1 normal and S2 normal ABDOMEN:abdomen soft, non-tender and normal bowel sounds BACK: Back symmetric, no curvature. EXTREMITIES:less then 2 second capillary refill, no joint deformities, effusion, or inflammation, no skin discoloration, no cyanosis  NEURO: alert & oriented x 3 with fluent speech, no focal motor/sensory deficits, gait normal   LABORATORY DATA: CBC    Component Value Date/Time   WBC 5.2 08/05/2016 1330   RBC 3.92 (L) 08/05/2016 1330   HGB 12.5 (L) 08/05/2016 1330   HCT 37.6 (L) 08/05/2016 1330   PLT 205 08/05/2016 1330     MCV 95.9 08/05/2016 1330   MCH 31.9 08/05/2016 1330   MCHC 33.2 08/05/2016 1330   RDW 15.2 08/05/2016 1330   LYMPHSABS 0.8 08/05/2016 1330   MONOABS 0.5 08/05/2016 1330   EOSABS 0.2 08/05/2016 1330   BASOSABS 0.0 08/05/2016 1330      Chemistry      Component Value Date/Time   NA 136 08/05/2016 1330   K 3.9 08/05/2016 1330   CL 100 (L) 08/05/2016 1330   CO2 28 08/05/2016 1330   BUN 29 (H) 08/05/2016 1330   CREATININE 1.39 (H) 08/05/2016 1330      Component Value Date/Time   CALCIUM 8.3 (L) 08/05/2016 1330   ALKPHOS 58 08/05/2016 1330   AST 20 08/05/2016 1330   ALT 11 (L) 08/05/2016 1330   BILITOT 0.6 08/05/2016 1330        PENDING LABS:   RADIOGRAPHIC STUDIES:  No results found.   PATHOLOGY:    ASSESSMENT AND  PLAN:  Malignant neoplasm of trigone of bladder (HCC) Muscle invasive carcinoma of the trigone of the bladder, on concomitant chemoXRT with weekly Carboplatin.  Oncology history is updated.  Labs today: CBC diff, CMET.  I personally reviewed and went over laboratory results with the patient.  The results are noted within this dictation.  Labs satisfy treatment parameters.  Pre-treatment labs weekly: CBC diff, CMET.  Smoking cessation education provided.  Return in 1 week for follow-up with treatment.   ORDERS PLACED FOR THIS ENCOUNTER: No orders of the defined types were placed in this encounter.   MEDICATIONS PRESCRIBED THIS ENCOUNTER: No orders of the defined types were placed in this encounter.   THERAPY PLAN:  Continue treatment as outlined above.  All questions were answered. The patient knows to call the clinic with any problems, questions or concerns. We can certainly see the patient much sooner if necessary.  Patient and plan discussed with Dr. Ancil Linsey and she is in agreement with the aforementioned.   This note is electronically signed by: Doy Mince 08/05/2016 5:52 PM

## 2016-08-05 NOTE — Assessment & Plan Note (Signed)
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  Oncology history is updated.  Labs today: CBC diff, CMET.  I personally reviewed and went over laboratory results with the patient.  The results are noted within this dictation.  Labs satisfy treatment parameters.  Pre-treatment labs weekly: CBC diff, CMET.  Smoking cessation education provided.  Return in 1 week for follow-up with treatment.

## 2016-08-05 NOTE — Patient Instructions (Signed)
[facility] Discharge Instructions for Patients Receiving Chemotherapy   Beginning January 23rd 2017 lab work for the Cancer Center will be done in the  Main lab at [facility] on 1st floor. If you have a lab appointment with the Cancer Center please come in thru the  Main Entrance and check in at the main information desk   Today you received the following chemotherapy agents:  Carboplatin  If you develop nausea and vomiting, or diarrhea that is not controlled by your medication, call the clinic.  The clinic phone number is [phone]. Office hours are Monday-Friday 8:30am-5:00pm.  BELOW ARE SYMPTOMS THAT SHOULD BE REPORTED IMMEDIATELY:  *FEVER GREATER THAN 101.0 F  *CHILLS WITH OR WITHOUT FEVER  NAUSEA AND VOMITING THAT IS NOT CONTROLLED WITH YOUR NAUSEA MEDICATION  *UNUSUAL SHORTNESS OF BREATH  *UNUSUAL BRUISING OR BLEEDING  TENDERNESS IN MOUTH AND THROAT WITH OR WITHOUT PRESENCE OF ULCERS  *URINARY PROBLEMS  *BOWEL PROBLEMS  UNUSUAL RASH Items with * indicate a potential emergency and should be followed up as soon as possible. If you have an emergency after office hours please contact your primary care physician or go to the nearest emergency department.  Please call the clinic during office hours if you have any questions or concerns.   You may also contact the Patient Navigator at [phone] should you have any questions or need assistance in obtaining follow up care.      Resources For Cancer Patients and their Caregivers ? American Cancer Society: Can assist with transportation, wigs, general needs, runs Look Good Feel Better.        1-888-227-6333 ? Cancer Care: Provides financial assistance, online support groups, medication/co-pay assistance.  1-800-813-HOPE (4673) ? [facility] Assists [place] cancer patients and their families through emotional , educational and financial support.   336-427-4357 ? [place] DSS Where to apply for food stamps, Medicaid and utility assistance. 336-342-1394 ? RCATS: Transportation to medical appointments. 336-347-2287 ? Social Security Administration: May apply for disability if have a Stage IV cancer. 336-342-7796 1-800-772-1213 ? [place] Aging, Disability and Transit Services: Assists with nutrition, care and transit needs. 336-349-2343         

## 2016-08-05 NOTE — Progress Notes (Signed)
Tolerated tx w/o adverse reaction.  Alert, in no distress.  VSS.  Discharged ambulatory. 

## 2016-08-05 NOTE — Patient Instructions (Signed)
[place] at [facility] Discharge Instructions  RECOMMENDATIONS MADE BY THE CONSULTANT AND ANY TEST RESULTS WILL BE SENT TO YOUR REFERRING PHYSICIAN.  You were seen by Gershon Mussel today. Return to center as scheduled.  Thank you for choosing [place] at [facility] to provide your oncology and hematology care.  To afford each patient quality time with our provider, please arrive at least 15 minutes before your scheduled appointment time.   Beginning January 23rd 2017 lab work for the [facility] will be done in the  Main lab at [facility] on 1st floor. If you have a lab appointment with the [facility] please come in thru the  Main Entrance and check in at the main information desk  You need to re-schedule your appointment should you arrive 10 or more minutes late.  We strive to give you quality time with our providers, and arriving late affects you and other patients whose appointments are after yours.  Also, if you no show three or more times for appointments you may be dismissed from the clinic at the providers discretion.     Again, thank you for choosing [facility].  Our hope is that these requests will decrease the amount of time that you wait before being seen by our physicians.       _____________________________________________________________  Should you have questions after your visit to [facility], please contact our office at [phone] between the hours of 8:30 a.m. and 4:30 p.m.  Voicemails left after 4:30 p.m. will not be returned until the following business day.  For prescription refill requests, have your pharmacy contact our office.         Resources For Cancer Patients and their Caregivers ? American Cancer Society: Can assist with transportation, wigs, general needs, runs Look Good Feel Better.        (305)149-0258 ? Cancer Care: Provides financial assistance, online support groups,  medication/co-pay assistance.  1-800-813-HOPE 206-249-2157) ? [facility] Assists [place] cancer patients and their families through emotional , educational and financial support.  (941)088-7991 ? [place] DSS Where to apply for food stamps, Medicaid and utility assistance. 469-764-9146 ? RCATS: Transportation to medical appointments. (814) 686-5829 ? Social Security Administration: May apply for disability if have a Stage IV cancer. (859)506-7948 509-588-5411 ? [employer], Disability and Transit Services: Assists with nutrition, care and transit needs. [place] Support Programs: @10RELATIVEDAYS @ > Cancer Support Group  2nd Tuesday of the month 1pm-2pm, Journey Room  > Creative Journey  3rd Tuesday of the month 1130am-1pm, Journey Room  > Look Good Feel Better  1st Wednesday of the month 10am-12 noon, Journey Room (Call [place] to register [phone])

## 2016-08-07 ENCOUNTER — Ambulatory Visit (HOSPITAL_COMMUNITY): Payer: Medicare Other | Admitting: Oncology

## 2016-08-11 ENCOUNTER — Other Ambulatory Visit (HOSPITAL_COMMUNITY): Payer: Medicare Other

## 2016-08-11 ENCOUNTER — Encounter (HOSPITAL_COMMUNITY): Payer: Medicare Other

## 2016-08-11 ENCOUNTER — Encounter (HOSPITAL_BASED_OUTPATIENT_CLINIC_OR_DEPARTMENT_OTHER): Payer: Medicare Other

## 2016-08-11 ENCOUNTER — Encounter (HOSPITAL_BASED_OUTPATIENT_CLINIC_OR_DEPARTMENT_OTHER): Payer: Medicare Other | Admitting: Oncology

## 2016-08-11 ENCOUNTER — Encounter (HOSPITAL_COMMUNITY): Payer: Self-pay | Admitting: Oncology

## 2016-08-11 ENCOUNTER — Ambulatory Visit (HOSPITAL_COMMUNITY): Payer: Medicare Other

## 2016-08-11 ENCOUNTER — Ambulatory Visit (HOSPITAL_COMMUNITY): Payer: Medicare Other | Admitting: Oncology

## 2016-08-11 VITALS — BP 121/64 | HR 66 | Temp 97.7°F | Resp 18 | Wt 188.0 lb

## 2016-08-11 DIAGNOSIS — Z720 Tobacco use: Secondary | ICD-10-CM

## 2016-08-11 DIAGNOSIS — Z23 Encounter for immunization: Secondary | ICD-10-CM | POA: Diagnosis not present

## 2016-08-11 DIAGNOSIS — Z5111 Encounter for antineoplastic chemotherapy: Secondary | ICD-10-CM

## 2016-08-11 DIAGNOSIS — C670 Malignant neoplasm of trigone of bladder: Secondary | ICD-10-CM | POA: Diagnosis not present

## 2016-08-11 LAB — CBC WITH DIFFERENTIAL/PLATELET
BASOS ABS: 0 10*3/uL (ref 0.0–0.1)
Basophils Relative: 0 %
Eosinophils Absolute: 0.7 10*3/uL (ref 0.0–0.7)
Eosinophils Relative: 9 %
HEMATOCRIT: 38.9 % — AB (ref 39.0–52.0)
Hemoglobin: 12.9 g/dL — ABNORMAL LOW (ref 13.0–17.0)
LYMPHS ABS: 0.4 10*3/uL — AB (ref 0.7–4.0)
LYMPHS PCT: 6 %
MCH: 31.9 pg (ref 26.0–34.0)
MCHC: 33.2 g/dL (ref 30.0–36.0)
MCV: 96 fL (ref 78.0–100.0)
Monocytes Absolute: 0.7 10*3/uL (ref 0.1–1.0)
Monocytes Relative: 10 %
NEUTROS ABS: 5.4 10*3/uL (ref 1.7–7.7)
Neutrophils Relative %: 75 %
Platelets: 185 10*3/uL (ref 150–400)
RBC: 4.05 MIL/uL — AB (ref 4.22–5.81)
RDW: 15 % (ref 11.5–15.5)
WBC: 7.2 10*3/uL (ref 4.0–10.5)

## 2016-08-11 LAB — COMPREHENSIVE METABOLIC PANEL
ALK PHOS: 60 U/L (ref 38–126)
ALT: 12 U/L — AB (ref 17–63)
AST: 19 U/L (ref 15–41)
Albumin: 3.6 g/dL (ref 3.5–5.0)
Anion gap: 9 (ref 5–15)
BILIRUBIN TOTAL: 0.3 mg/dL (ref 0.3–1.2)
BUN: 24 mg/dL — AB (ref 6–20)
CALCIUM: 8.6 mg/dL — AB (ref 8.9–10.3)
CHLORIDE: 99 mmol/L — AB (ref 101–111)
CO2: 28 mmol/L (ref 22–32)
CREATININE: 1.5 mg/dL — AB (ref 0.61–1.24)
GFR, EST AFRICAN AMERICAN: 53 mL/min — AB (ref >60)
GFR, EST NON AFRICAN AMERICAN: 46 mL/min — AB (ref >60)
Glucose, Bld: 92 mg/dL (ref 65–99)
Potassium: 4.1 mmol/L (ref 3.5–5.1)
Sodium: 136 mmol/L (ref 135–145)
TOTAL PROTEIN: 6.8 g/dL (ref 6.5–8.1)

## 2016-08-11 MED ORDER — INFLUENZA VAC SPLIT QUAD 0.5 ML IM SUSY
PREFILLED_SYRINGE | INTRAMUSCULAR | Status: AC
  Filled 2016-08-11: qty 0.5

## 2016-08-11 MED ORDER — SODIUM CHLORIDE 0.9 % IV SOLN
Freq: Once | INTRAVENOUS | Status: AC
  Administered 2016-08-11: 14:00:00 via INTRAVENOUS

## 2016-08-11 MED ORDER — SODIUM CHLORIDE 0.9 % IV SOLN
165.0000 mg | Freq: Once | INTRAVENOUS | Status: AC
  Administered 2016-08-11: 170 mg via INTRAVENOUS
  Filled 2016-08-11: qty 17

## 2016-08-11 MED ORDER — PALONOSETRON HCL INJECTION 0.25 MG/5ML
0.2500 mg | Freq: Once | INTRAVENOUS | Status: AC
  Administered 2016-08-11: 0.25 mg via INTRAVENOUS

## 2016-08-11 MED ORDER — PALONOSETRON HCL INJECTION 0.25 MG/5ML
INTRAVENOUS | Status: AC
  Filled 2016-08-11: qty 5

## 2016-08-11 MED ORDER — INFLUENZA VAC SPLIT QUAD 0.5 ML IM SUSY
0.5000 mL | PREFILLED_SYRINGE | Freq: Once | INTRAMUSCULAR | Status: AC
  Administered 2016-08-11: 0.5 mL via INTRAMUSCULAR

## 2016-08-11 MED ORDER — SODIUM CHLORIDE 0.9 % IV SOLN
10.0000 mg | Freq: Once | INTRAVENOUS | Status: AC
  Administered 2016-08-11: 10 mg via INTRAVENOUS
  Filled 2016-08-11: qty 1

## 2016-08-11 MED ORDER — SODIUM CHLORIDE 0.9% FLUSH: 10.0000 mL | INTRAVENOUS | Status: DC | PRN

## 2016-08-11 NOTE — Progress Notes (Signed)
Carl Murphy, [place] [address]  Malignant neoplasm of trigone of bladder [facility]) - Plan: CBC with Differential, Comprehensive metabolic panel  CURRENT THERAPY:  Weekly Carboplatin with XRT beginning on 07/22/2016.  INTERVAL HISTORY: Carl Murphy 70 y.o. male returns for followup of muscle invasive carcinoma of the bladder.   Oncology History   Per available records had bladder tumor removed but had extension into the muscularis propria. CT showed no extension beyond the bladder. Has seen 2 urologists in consultation. Adamant about not having a cystectomy and ileal conduit.     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 No findings for local spread of disease or metastatic disease      03/25/2016 Surgery    TURBT 3cm right trigonal bladder tumor. followed by intravesical epirubicin instillation      03/25/2016 Pathology Results    bladder TURBT infiltrative high grade papillary urothelial carcinoma, invades the mulcularis propria (detrusor) no LVI       04/14/2016 Miscellaneous    [name] consultation in [place] for consideration of cystectomy. patient also offered repeat resection with BCG, and CHEMO/XRT      06/17/2016 Pathology Results    TURB- high grade papillary urothelial carcinoma with focal stromal invasion      07/22/2016 -  Chemotherapy    The patient had palonosetron (ALOXI) injection 0.25 mg, 0.25 mg, Intravenous,  Once, 2 of 5 cycles Administration: 0.25 mg (07/28/2016)  CARBOplatin (PARAPLATIN) 160 mg in sodium chloride 0.9 % 100 mL chemo infusion, 160 mg (100 % of original dose 160.6 mg), Intravenous,  Once, 2 of 5 cycles Dose modification:   (original dose 160.6 mg, Cycle 1),   (original dose 160.6 mg, Cycle 1),   (original dose 160.6 mg, Cycle 1),   (original dose 160.6 mg, Cycle 2) Administration: 180 mg (07/28/2016)  for chemotherapy treatment.         Radiation Therapy     The patient saw No care team member to display for radiation treatment. This is the current list of radiation treatment: Radiation Treatments       Patient's record has no active or historical radiation treatments documented.              He denies any complaints with regards to treatment.  He is tolerating well.  He denies any nausea/vomiting, cough, fatigue, tiredness, constipation, diarrhea.  He notes that he feels great.  His appetite is strong.  Weight is stable.  Review of Systems  Constitutional: Negative.  Negative for chills, fever and weight loss.  HENT: Negative.   Eyes: Negative.   Respiratory: Negative.   Cardiovascular: Negative.   Gastrointestinal: Negative.   Genitourinary: Negative.   Musculoskeletal: Negative.   Skin: Negative.   Neurological: Negative.   Endo/Heme/Allergies: Negative.   Psychiatric/Behavioral: Negative.     Past Medical History:  Diagnosis Date  . Arthritis   . Cancer [facility])    Bladder Cancer  . CHF (congestive heart failure) ([place])   . COPD (chronic obstructive pulmonary disease) ([place])   . Depression   . Hard of hearing    bilat   . Hypertension   . Shortness of breath dyspnea    heat; walking up set of stairs; pt states can walk up 12-14 stairs slowly without having to stop to catch his breath  Past Surgical History:  Procedure Laterality Date  . CYSTOSCOPY N/A 06/16/2016   Procedure: CYSTOSCOPY;  Surgeon: Franchot Gallo, MD;  Location: [facility];  Service: Urology;  Laterality: N/A;  . TRANSURETHRAL RESECTION OF BLADDER TUMOR N/A 03/25/2016   Procedure: TRANSURETHRAL RESECTION OF BLADDER TUMOR (TURBT);  Surgeon: Franchot Gallo, MD;  Location: [facility];  Service: Urology;  Laterality: N/A;  . TRANSURETHRAL RESECTION OF BLADDER TUMOR N/A 06/16/2016   Procedure: TRANSURETHRAL RESECTION OF BLADDER TUMOR (TURBT);  Surgeon: Franchot Gallo, MD;  Location[facility];   Service: Urology;  Laterality: N/A;    Family History  Problem Relation Age of Onset  . Hypertension Mother   . Asthma Father   . Emphysema Father     Social History   Social History  . Marital status: Married    Spouse name: N/A  . Number of children: N/A  . Years of education: N/A   Social History Main Topics  . Smoking status: Current Every Day Smoker    Packs/day: 2.00    Years: 50.00    Types: Cigarettes  . Smokeless tobacco: Never Used  . Alcohol use No     Comment: rarestop drinking several years ago; then begin to drink again for approx 3 wks but has currently stopped  . Drug use: No  . Sexual activity: No   Other Topics Concern  . None   Social History Narrative  . None     PHYSICAL EXAMINATION  ECOG PERFORMANCE STATUS: 0 - Asymptomatic  There were no vitals filed for this visit.  BP 111/67 P 64 R 18 T 97.8 O2 95%  GENERAL:alert, no distress, well nourished, well developed, comfortable, cooperative, obese, smiling and unaccompanied in chemo-recliner SKIN: skin color, texture, turgor are normal, no rashes or significant lesions HEAD: Normocephalic, No masses, lesions, tenderness or abnormalities EYES: normal, EOMI, Conjunctiva are pink and non-injected EARS: External ears normal OROPHARYNX:lips, buccal mucosa, and tongue normal and mucous membranes are moist  NECK: supple, trachea midline LYMPH:  no palpable lymphadenopathy BREAST:not examined LUNGS: inspiratory and expiratory wheezes heard bilaterally throughout. HEART: regular rate & rhythm, no murmurs, no gallops, S1 normal and S2 normal ABDOMEN:abdomen soft, non-tender and normal bowel sounds BACK: Back symmetric, no curvature. EXTREMITIES:less then 2 second capillary refill, no joint deformities, effusion, or inflammation, no skin discoloration, no cyanosis  NEURO: alert & oriented x 3 with fluent speech, no focal motor/sensory deficits, gait normal   LABORATORY DATA: CBC    Component  Value Date/Time   WBC 7.2 08/11/2016 1218   RBC 4.05 (L) 08/11/2016 1218   HGB 12.9 (L) 08/11/2016 1218   HCT 38.9 (L) 08/11/2016 1218   PLT 185 08/11/2016 1218   MCV 96.0 08/11/2016 1218   MCH 31.9 08/11/2016 1218   MCHC 33.2 08/11/2016 1218   RDW 15.0 08/11/2016 1218   LYMPHSABS 0.4 (L) 08/11/2016 1218   MONOABS 0.7 08/11/2016 1218   EOSABS 0.7 08/11/2016 1218   BASOSABS 0.0 08/11/2016 1218      Chemistry      Component Value Date/Time   NA 136 08/11/2016 1218   K 4.1 08/11/2016 1218   CL 99 (L) 08/11/2016 1218   CO2 28 08/11/2016 1218   BUN 24 (H) 08/11/2016 1218   CREATININE 1.50 (H) 08/11/2016 1218      Component Value Date/Time   CALCIUM 8.6 (L) 08/11/2016 1218   ALKPHOS 60 08/11/2016 1218   AST 19 08/11/2016 1218   ALT 12 (L) 08/11/2016 1218   BILITOT  0.3 08/11/2016 1218        PENDING LABS:   RADIOGRAPHIC STUDIES:  No results found.   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  Oncology history is updated.  Labs today: CBC diff, CMET.  I personally reviewed and went over laboratory results with the patient.  The results are noted within this dictation.  Labs satisfy treatment parameters.  Pre-treatment labs weekly: CBC diff, CMET.  Smoking cessation education provided.  Return in 1 week for follow-up with treatment.   ORDERS PLACED FOR THIS ENCOUNTER: No orders of the defined types were placed in this encounter.   MEDICATIONS PRESCRIBED THIS ENCOUNTER: No orders of the defined types were placed in this encounter.   THERAPY PLAN:  Complete treatment as outlined above.  This will be followed by tumor status evaluation.    All questions were answered. The patient knows to call the clinic with any problems, questions or concerns. We can certainly see the patient much sooner if  necessary.  Patient and plan discussed with Dr. Ancil Linsey and she is in agreement with the aforementioned.   This note is electronically signed by: Doy Mince 08/11/2016 1:51 PM

## 2016-08-11 NOTE — Assessment & Plan Note (Signed)
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  Oncology history is updated.  Labs today: CBC diff, CMET.  I personally reviewed and went over laboratory results with the patient.  The results are noted within this dictation.  Labs satisfy treatment parameters.  Pre-treatment labs weekly: CBC diff, CMET.  Smoking cessation education provided.  Return in 1 week for follow-up with treatment.

## 2016-08-11 NOTE — Progress Notes (Signed)
Labs discussed with Robynn Pane, PA prior to treatment, ok to treat.  No further instruction given.  Patient tolerated infusion well.  VSS.  Patient ambulatory and stable upon discharge from clinic.

## 2016-08-11 NOTE — Patient Instructions (Signed)
[place] at [facility] Discharge Instructions  RECOMMENDATIONS MADE BY THE CONSULTANT AND ANY TEST RESULTS WILL BE SENT TO YOUR REFERRING PHYSICIAN.  You were seen by Carl Murphy today Return to center for follow up and treatment as scheduled.  Thank you for choosing [place] at [facility] to provide your oncology and hematology care.  To afford each patient quality time with our provider, please arrive at least 15 minutes before your scheduled appointment time.   Beginning January 23rd 2017 lab work for the [facility] will be done in the  Main lab at [facility] on 1st floor. If you have a lab appointment with the [facility] please come in thru the  Main Entrance and check in at the main information desk  You need to re-schedule your appointment should you arrive 10 or more minutes late.  We strive to give you quality time with our providers, and arriving late affects you and other patients whose appointments are after yours.  Also, if you no show three or more times for appointments you may be dismissed from the clinic at the providers discretion.     Again, thank you for choosing [facility].  Our hope is that these requests will decrease the amount of time that you wait before being seen by our physicians.       _____________________________________________________________  Should you have questions after your visit to [facility], please contact our office at [phone] between the hours of 8:30 a.m. and 4:30 p.m.  Voicemails left after 4:30 p.m. will not be returned until the following business day.  For prescription refill requests, have your pharmacy contact our office.         Resources For Cancer Patients and their Caregivers ? American Cancer Society: Can assist with transportation, wigs, general needs, runs Look Good Feel Better.        8326118312 ? Cancer Care: Provides financial assistance,  online support groups, medication/co-pay assistance.  1-800-813-HOPE 315-258-4289) ? [place] Assists [place] cancer patients and their families through emotional , educational and financial support.  (469) 461-3799 ? [place] DSS Where to apply for food stamps, Medicaid and utility assistance. 6127771548 ? RCATS: Transportation to medical appointments. 612-461-2076 ? Social Security Administration: May apply for disability if have a Stage IV cancer. [phone] 332-379-9582 ? [employer], Disability and Transit Services: Assists with nutrition, care and transit needs. [place] Support Programs: @10RELATIVEDAYS @ > Cancer Support Group  2nd Tuesday of the month 1pm-2pm, Journey Room  > Creative Journey  3rd Tuesday of the month 1130am-1pm, Journey Room  > Look Good Feel Better  1st Wednesday of the month 10am-12 noon, Journey Room (Call [place] to register [phone])

## 2016-08-13 ENCOUNTER — Ambulatory Visit (HOSPITAL_COMMUNITY): Payer: Medicare Other | Admitting: Hematology & Oncology

## 2016-08-13 NOTE — Patient Instructions (Signed)
[facility] Discharge Instructions for Patients Receiving Chemotherapy   Beginning January 23rd 2017 lab work for the [facility] will be done in the  Main lab at [place] on 1st floor. If you have a lab appointment with the [facility] please come in thru the  Main Entrance and check in at the main information desk   Today you received the following chemotherapy agent: Carboplatin.   If you develop nausea and vomiting, or diarrhea that is not controlled by your medication, call the clinic.  The clinic phone number is [phone]. Office hours are Monday-Friday 8:30am-5:00pm.  BELOW ARE SYMPTOMS THAT SHOULD BE REPORTED IMMEDIATELY:  *FEVER GREATER THAN 101.0 F  *CHILLS WITH OR WITHOUT FEVER  NAUSEA AND VOMITING THAT IS NOT CONTROLLED WITH YOUR NAUSEA MEDICATION  *UNUSUAL SHORTNESS OF BREATH  *UNUSUAL BRUISING OR BLEEDING  TENDERNESS IN MOUTH AND THROAT WITH OR WITHOUT PRESENCE OF ULCERS  *URINARY PROBLEMS  *BOWEL PROBLEMS  UNUSUAL RASH Items with * indicate a potential emergency and should be followed up as soon as possible. If you have an emergency after office hours please contact your primary care physician or go to the nearest emergency department.  Please call the clinic during office hours if you have any questions or concerns.   You may also contact the Patient Navigator at [phone] should you have any questions or need assistance in obtaining follow up care.      Resources For Cancer Patients and their Caregivers ? American Cancer Society: Can assist with transportation, wigs, general needs, runs Look Good Feel Better.        478-229-9634 ? Cancer Care: Provides financial assistance, online support groups, medication/co-pay assistance.  1-800-813-HOPE 820-884-2191) ? [place] Assists [place] cancer patients and their families through emotional , educational and financial support.   937-816-4230 ? [place] DSS Where to apply for food stamps, Medicaid and utility assistance. ([phone] 294-8351 ? RCATS: Transportation to medical appointments. 252-245-1710 ? Social Security Administration: May apply for disability if have a Stage IV cancer. [phone] 573-052-3642 ? [employer], Disability and Transit Services: Assists with nutrition, care and transit needs. [phone]

## 2016-08-18 ENCOUNTER — Ambulatory Visit (HOSPITAL_COMMUNITY): Payer: Medicare Other

## 2016-08-18 ENCOUNTER — Other Ambulatory Visit (HOSPITAL_COMMUNITY): Payer: Medicare Other

## 2016-08-18 ENCOUNTER — Ambulatory Visit (HOSPITAL_COMMUNITY): Payer: Medicare Other | Admitting: Hematology & Oncology

## 2016-08-18 ENCOUNTER — Encounter (HOSPITAL_BASED_OUTPATIENT_CLINIC_OR_DEPARTMENT_OTHER): Payer: Medicare Other

## 2016-08-18 ENCOUNTER — Encounter (HOSPITAL_COMMUNITY): Payer: Medicare Other

## 2016-08-18 VITALS — BP 122/78 | HR 63 | Temp 97.5°F | Resp 18 | Wt 188.0 lb

## 2016-08-18 DIAGNOSIS — Z5111 Encounter for antineoplastic chemotherapy: Secondary | ICD-10-CM | POA: Diagnosis not present

## 2016-08-18 DIAGNOSIS — C670 Malignant neoplasm of trigone of bladder: Secondary | ICD-10-CM

## 2016-08-18 LAB — COMPREHENSIVE METABOLIC PANEL
ALBUMIN: 3.6 g/dL (ref 3.5–5.0)
ALT: 14 U/L — ABNORMAL LOW (ref 17–63)
ANION GAP: 7 (ref 5–15)
AST: 20 U/L (ref 15–41)
Alkaline Phosphatase: 52 U/L (ref 38–126)
BILIRUBIN TOTAL: 0.4 mg/dL (ref 0.3–1.2)
BUN: 26 mg/dL — AB (ref 6–20)
CHLORIDE: 101 mmol/L (ref 101–111)
CO2: 27 mmol/L (ref 22–32)
Calcium: 8.4 mg/dL — ABNORMAL LOW (ref 8.9–10.3)
Creatinine, Ser: 1.69 mg/dL — ABNORMAL HIGH (ref 0.61–1.24)
GFR calc Af Amer: 46 mL/min — ABNORMAL LOW (ref >60)
GFR calc non Af Amer: 40 mL/min — ABNORMAL LOW (ref >60)
GLUCOSE: 93 mg/dL (ref 65–99)
POTASSIUM: 4.1 mmol/L (ref 3.5–5.1)
SODIUM: 135 mmol/L (ref 135–145)
TOTAL PROTEIN: 6.6 g/dL (ref 6.5–8.1)

## 2016-08-18 LAB — CBC WITH DIFFERENTIAL/PLATELET
BASOS ABS: 0 10*3/uL (ref 0.0–0.1)
BASOS PCT: 0 %
EOS ABS: 0.2 10*3/uL (ref 0.0–0.7)
Eosinophils Relative: 5 %
HCT: 36.7 % — ABNORMAL LOW (ref 39.0–52.0)
Hemoglobin: 12.4 g/dL — ABNORMAL LOW (ref 13.0–17.0)
Lymphocytes Relative: 9 %
Lymphs Abs: 0.4 10*3/uL — ABNORMAL LOW (ref 0.7–4.0)
MCH: 32.5 pg (ref 26.0–34.0)
MCHC: 33.8 g/dL (ref 30.0–36.0)
MCV: 96.1 fL (ref 78.0–100.0)
MONO ABS: 0.6 10*3/uL (ref 0.1–1.0)
MONOS PCT: 12 %
NEUTROS ABS: 3.5 10*3/uL (ref 1.7–7.7)
Neutrophils Relative %: 74 %
PLATELETS: 153 10*3/uL (ref 150–400)
RBC: 3.82 MIL/uL — ABNORMAL LOW (ref 4.22–5.81)
RDW: 15 % (ref 11.5–15.5)
WBC: 4.7 10*3/uL (ref 4.0–10.5)

## 2016-08-18 MED ORDER — SODIUM CHLORIDE 0.9 % IV SOLN
Freq: Once | INTRAVENOUS | Status: AC
  Administered 2016-08-18: 14:00:00 via INTRAVENOUS

## 2016-08-18 MED ORDER — SODIUM CHLORIDE 0.9 % IV SOLN
152.2000 mg | Freq: Once | INTRAVENOUS | Status: AC
  Administered 2016-08-18: 150 mg via INTRAVENOUS
  Filled 2016-08-18: qty 15

## 2016-08-18 MED ORDER — SODIUM CHLORIDE 0.9% FLUSH
10.0000 mL | INTRAVENOUS | Status: DC | PRN
  Administered 2016-08-18: 10 mL
  Filled 2016-08-18: qty 10

## 2016-08-18 MED ORDER — PALONOSETRON HCL INJECTION 0.25 MG/5ML
0.2500 mg | Freq: Once | INTRAVENOUS | Status: AC
  Administered 2016-08-18: 0.25 mg via INTRAVENOUS
  Filled 2016-08-18: qty 5

## 2016-08-18 MED ORDER — SODIUM CHLORIDE 0.9 % IV SOLN
10.0000 mg | Freq: Once | INTRAVENOUS | Status: AC
  Administered 2016-08-18: 10 mg via INTRAVENOUS
  Filled 2016-08-18: qty 1

## 2016-08-18 NOTE — Progress Notes (Signed)
Carl Murphy tolerated chemo tx well without incident or complaints. VSS upon discharge. Pt discharged self ambulatory in satisfactory condition

## 2016-08-18 NOTE — Patient Instructions (Signed)
[facility] Discharge Instructions for Patients Receiving Chemotherapy   Beginning January 23rd 2017 lab work for the Cancer Center will be done in the  Main lab at [facility] on 1st floor. If you have a lab appointment with the Cancer Center please come in thru the  Main Entrance and check in at the main information desk   Today you received the following chemotherapy agents Carboplatin. Follow-up as scheduled. Call clinic for any questions or concerns  To help prevent nausea and vomiting after your treatment, we encourage you to take your nausea medication   If you develop nausea and vomiting, or diarrhea that is not controlled by your medication, call the clinic.  The clinic phone number is [phone]. Office hours are Monday-Friday 8:30am-5:00pm.  BELOW ARE SYMPTOMS THAT SHOULD BE REPORTED IMMEDIATELY:  *FEVER GREATER THAN 101.0 F  *CHILLS WITH OR WITHOUT FEVER  NAUSEA AND VOMITING THAT IS NOT CONTROLLED WITH YOUR NAUSEA MEDICATION  *UNUSUAL SHORTNESS OF BREATH  *UNUSUAL BRUISING OR BLEEDING  TENDERNESS IN MOUTH AND THROAT WITH OR WITHOUT PRESENCE OF ULCERS  *URINARY PROBLEMS  *BOWEL PROBLEMS  UNUSUAL RASH Items with * indicate a potential emergency and should be followed up as soon as possible. If you have an emergency after office hours please contact your primary care physician or go to the nearest emergency department.  Please call the clinic during office hours if you have any questions or concerns.   You may also contact the Patient Navigator at [phone] should you have any questions or need assistance in obtaining follow up care.      Resources For Cancer Patients and their Caregivers ? American Cancer Society: Can assist with transportation, wigs, general needs, runs Look Good Feel Better.        1-888-227-6333 ? Cancer Care: Provides financial assistance, online support groups, medication/co-pay assistance.  1-800-813-HOPE  (4673) ? [facility] Assists [place] cancer patients and their families through emotional , educational and financial support.  336-427-4357 ? [place] DSS Where to apply for food stamps, Medicaid and utility assistance. 336-342-1394 ? RCATS: Transportation to medical appointments. 336-347-2287 ? Social Security Administration: May apply for disability if have a Stage IV cancer. [phone] 1-800-772-1213 ? [place] Aging, Disability and Transit Services: Assists with nutrition, care and transit needs. [phone]         

## 2016-08-19 ENCOUNTER — Encounter (HOSPITAL_BASED_OUTPATIENT_CLINIC_OR_DEPARTMENT_OTHER): Payer: Medicare Other | Admitting: Oncology

## 2016-08-19 ENCOUNTER — Encounter (HOSPITAL_COMMUNITY): Payer: Self-pay | Admitting: Oncology

## 2016-08-19 DIAGNOSIS — C670 Malignant neoplasm of trigone of bladder: Secondary | ICD-10-CM | POA: Diagnosis not present

## 2016-08-19 DIAGNOSIS — Z720 Tobacco use: Secondary | ICD-10-CM

## 2016-08-19 NOTE — Assessment & Plan Note (Addendum)
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  Oncology history is updated.  Labs performed yesterday are reviewed: CBC diff, CMET.  I personally reviewed and went over laboratory results with the patient.  The results are noted within this dictation.  Labs satisfied treatment parameters.  Labs weekly: CBC diff, CMET with ongoing treatment.  He is planned to completed XRT on 09/10/2016.  Smoking cessation education provided.  Return next week for treatment and follow-up appointment.

## 2016-08-19 NOTE — Patient Instructions (Signed)
[place] at [facility] Discharge Instructions  RECOMMENDATIONS MADE BY THE CONSULTANT AND ANY TEST RESULTS WILL BE SENT TO YOUR REFERRING PHYSICIAN.  You were seen today by Kirby Crigler PA-C. Return next week for treatment and follow up visit.    Thank you for choosing [place] at [facility] to provide your oncology and hematology care.  To afford each patient quality time with our provider, please arrive at least 15 minutes before your scheduled appointment time.   Beginning January 23rd 2017 lab work for the [facility] will be done in the  Main lab at [facility] on 1st floor. If you have a lab appointment with the [facility] please come in thru the  Main Entrance and check in at the main information desk  You need to re-schedule your appointment should you arrive 10 or more minutes late.  We strive to give you quality time with our providers, and arriving late affects you and other patients whose appointments are after yours.  Also, if you no show three or more times for appointments you may be dismissed from the clinic at the providers discretion.     Again, thank you for choosing [facility].  Our hope is that these requests will decrease the amount of time that you wait before being seen by our physicians.       _____________________________________________________________  Should you have questions after your visit to [facility], please contact our office at [phone] between the hours of 8:30 a.m. and 4:30 p.m.  Voicemails left after 4:30 p.m. will not be returned until the following business day.  For prescription refill requests, have your pharmacy contact our office.         Resources For Cancer Patients and their Caregivers ? American Cancer Society: Can assist with transportation, wigs, general needs, runs Look Good Feel Better.        5412812944 ? Cancer Care: Provides financial  assistance, online support groups, medication/co-pay assistance.  1-800-813-HOPE (774)482-6095) ? [place] Assists [place] cancer patients and their families through emotional , educational and financial support.  (801)096-3881 ? [place] DSS Where to apply for food stamps, Medicaid and utility assistance. 509-198-3057 ? RCATS: Transportation to medical appointments. ([phone] ? Social Security Administration: May apply for disability if have a Stage IV cancer. 917-239-9616 314-199-9738 ? [employer], Disability and Transit Services: Assists with nutrition, care and transit needs. [place] Support Programs: @10RELATIVEDAYS @ > Cancer Support Group  2nd Tuesday of the month 1pm-2pm, Journey Room  > Creative Journey  3rd Tuesday of the month 1130am-1pm, Journey Room  > Look Good Feel Better  1st Wednesday of the month 10am-12 noon, Journey Room (Call [place] to register [phone])

## 2016-08-19 NOTE — Progress Notes (Signed)
[name], MD [address] [place] [address]  Malignant neoplasm of trigone of bladder [facility])  CURRENT THERAPY:  Weekly Carboplatin with XRT beginning on 07/22/2016.  INTERVAL HISTORY: Carl Murphy 70 y.o. male returns for followup of muscle invasive carcinoma of the bladder.   Oncology History   Per available records had bladder tumor removed but had extension into the muscularis propria. CT showed no extension beyond the bladder. Has seen 2 urologists in consultation. [place] about not having a cystectomy and ileal conduit.     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 No findings for local spread of disease or metastatic disease      03/25/2016 Surgery    TURBT 3cm right trigonal bladder tumor. followed by intravesical epirubicin instillation      03/25/2016 Pathology Results    bladder TURBT infiltrative high grade papillary urothelial carcinoma, invades the mulcularis propria (detrusor) no LVI       04/14/2016 Miscellaneous    [name] consultation in [place] for consideration of cystectomy. patient also offered repeat resection with BCG, and CHEMO/XRT      06/17/2016 Pathology Results    TURB- high grade papillary urothelial carcinoma with focal stromal invasion      07/22/2016 -  Chemotherapy    The patient had palonosetron (ALOXI) injection 0.25 mg, 0.25 mg, Intravenous,  Once, 2 of 5 cycles Administration: 0.25 mg (07/28/2016)  CARBOplatin (PARAPLATIN) 160 mg in sodium chloride 0.9 % 100 mL chemo infusion, 160 mg (100 % of original dose 160.6 mg), Intravenous,  Once, 2 of 5 cycles Dose modification:   (original dose 160.6 mg, Cycle 1),   (original dose 160.6 mg, Cycle 1),   (original dose 160.6 mg, Cycle 1),   (original dose 160.6 mg, Cycle 2) Administration: 180 mg (07/28/2016)  for chemotherapy treatment.         Radiation Therapy   Planned to be completed on 09/10/2016.       He denies any complaints with regards to treatment.  He is tolerating well.  He denies any nausea/vomiting, cough, fatigue, tiredness, constipation, diarrhea.  He notes that he feels great.  His appetite is strong.  Weight is stable.  He notes an episode of loose stools last week. This resolved with the help of Imodium.   Review of Systems  Constitutional: Negative.  Negative for chills, fever and weight loss.  HENT: Negative.   Eyes: Negative.   Respiratory: Negative.   Cardiovascular: Negative.   Gastrointestinal: Negative.   Genitourinary: Negative.   Musculoskeletal: Negative.   Skin: Negative.   Neurological: Negative.   Endo/Heme/Allergies: Negative.   Psychiatric/Behavioral: Negative.     Past Medical History:  Diagnosis Date  . Arthritis   . Cancer [facility])    Bladder Cancer  . CHF (congestive heart failure) ([place])   . COPD (chronic obstructive pulmonary disease) ([place])   . Depression   . Hard of hearing    bilat   . Hypertension   . Malignant neoplasm of trigone of bladder ([place]) 03/25/2016  . Shortness of breath dyspnea    heat; walking up set of stairs; pt states can walk up 12-14 stairs slowly without having to stop to catch his breath    Past Surgical History:  Procedure Laterality Date  . CYSTOSCOPY N/A 06/16/2016   Procedure: CYSTOSCOPY;  Surgeon: Franchot Gallo, MD;  Location: [facility] ORS;  Service: Urology;  Laterality: N/A;  . TRANSURETHRAL RESECTION OF BLADDER TUMOR N/A 03/25/2016   Procedure: TRANSURETHRAL RESECTION OF BLADDER TUMOR (TURBT);  Surgeon: Franchot Gallo, MD;  Location: [facility];  Service: Urology;  Laterality: N/A;  . TRANSURETHRAL RESECTION OF BLADDER TUMOR N/A 06/16/2016   Procedure: TRANSURETHRAL RESECTION OF BLADDER TUMOR (TURBT);  Surgeon: Franchot Gallo, MD;  Location: [facility];  Service: Urology;  Laterality: N/A;    Family History  Problem Relation Age of Onset  . Hypertension Mother   .  Asthma Father   . Emphysema Father     Social History   Social History  . Marital status: Married    Spouse name: N/A  . Number of children: N/A  . Years of education: N/A   Social History Main Topics  . Smoking status: Current Every Day Smoker    Packs/day: 2.00    Years: 50.00    Types: Cigarettes  . Smokeless tobacco: Never Used  . Alcohol use No     Comment: rarestop drinking several years ago; then begin to drink again for approx 3 wks but has currently stopped  . Drug use: No  . Sexual activity: No   Other Topics Concern  . None   Social History Narrative  . None     PHYSICAL EXAMINATION  ECOG PERFORMANCE STATUS: 0 - Asymptomatic  Vitals:   08/19/16 0850  BP: 110/67  Pulse: 74  Resp: 16  Temp: 97.8 F (36.6 C)     GENERAL:alert, no distress, well nourished, well developed, comfortable, cooperative, obese, smiling and accompanied by his daughter. SKIN: skin color, texture, turgor are normal, no rashes or significant lesions HEAD: Normocephalic, No masses, lesions, tenderness or abnormalities EYES: normal, EOMI, Conjunctiva are pink and non-injected EARS: External ears normal OROPHARYNX:lips, buccal mucosa, and tongue normal and mucous membranes are moist  NECK: supple, trachea midline LYMPH:  no palpable lymphadenopathy BREAST:not examined LUNGS: inspiratory and expiratory wheezes heard bilaterally throughout. HEART: regular rate & rhythm, no murmurs, no gallops, S1 normal and S2 normal ABDOMEN:abdomen soft, non-tender and normal bowel sounds BACK: Back symmetric, no curvature. EXTREMITIES:less then 2 second capillary refill, no joint deformities, effusion, or inflammation, no skin discoloration, no cyanosis  NEURO: alert & oriented x 3 with fluent speech, no focal motor/sensory deficits, gait normal   LABORATORY DATA: CBC    Component Value Date/Time   WBC 4.7 08/18/2016 1250   RBC 3.82 (L) 08/18/2016 1250   HGB 12.4 (L) 08/18/2016 1250   HCT  36.7 (L) 08/18/2016 1250   PLT 153 08/18/2016 1250   MCV 96.1 08/18/2016 1250   MCH 32.5 08/18/2016 1250   MCHC 33.8 08/18/2016 1250   RDW 15.0 08/18/2016 1250   LYMPHSABS 0.4 (L) 08/18/2016 1250   MONOABS 0.6 08/18/2016 1250   EOSABS 0.2 08/18/2016 1250   BASOSABS 0.0 08/18/2016 1250      Chemistry      Component Value Date/Time   NA 135 08/18/2016 1250   K 4.1 08/18/2016 1250   CL 101 08/18/2016 1250   CO2 27 08/18/2016 1250   BUN 26 (H) 08/18/2016 1250   CREATININE 1.69 (H) 08/18/2016 1250      Component Value Date/Time   CALCIUM 8.4 (L) 08/18/2016 1250   ALKPHOS 52 08/18/2016 1250   AST 20 08/18/2016 1250   ALT 14 (L) 08/18/2016 1250   BILITOT 0.4 08/18/2016 1250        PENDING LABS:   RADIOGRAPHIC STUDIES:  No  results found.   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  Oncology history is updated.  Labs performed yesterday are reviewed: CBC diff, CMET.  I personally reviewed and went over laboratory results with the patient.  The results are noted within this dictation.  Labs satisfied treatment parameters.  Labs weekly: CBC diff, CMET with ongoing treatment.  He is planned to completed XRT on 09/10/2016.  Smoking cessation education provided.  Return next week for treatment and follow-up appointment.    ORDERS PLACED FOR THIS ENCOUNTER: No orders of the defined types were placed in this encounter.   MEDICATIONS PRESCRIBED THIS ENCOUNTER: No orders of the defined types were placed in this encounter.   THERAPY PLAN:  Complete treatment as outlined above.  This will be followed by tumor status evaluation.    All questions were answered. The patient knows to call the clinic with any problems, questions or concerns. We can certainly see the patient much sooner if necessary.  Patient and plan  discussed with Dr. Ancil Linsey and she is in agreement with the aforementioned.   This note is electronically signed by: Doy Mince 08/19/2016 9:09 AM

## 2016-08-25 ENCOUNTER — Encounter (HOSPITAL_COMMUNITY): Payer: Medicare Other

## 2016-08-25 ENCOUNTER — Ambulatory Visit (HOSPITAL_COMMUNITY): Payer: Medicare Other | Admitting: Hematology & Oncology

## 2016-08-25 ENCOUNTER — Encounter (HOSPITAL_BASED_OUTPATIENT_CLINIC_OR_DEPARTMENT_OTHER): Payer: Medicare Other

## 2016-08-25 ENCOUNTER — Encounter (HOSPITAL_COMMUNITY): Payer: Self-pay | Admitting: Hematology & Oncology

## 2016-08-25 ENCOUNTER — Encounter (HOSPITAL_BASED_OUTPATIENT_CLINIC_OR_DEPARTMENT_OTHER): Payer: Medicare Other | Admitting: Hematology & Oncology

## 2016-08-25 VITALS — BP 127/62 | HR 70 | Temp 98.4°F | Resp 18

## 2016-08-25 VITALS — BP 97/52 | HR 69 | Temp 98.6°F | Resp 18 | Wt 185.2 lb

## 2016-08-25 DIAGNOSIS — Z5111 Encounter for antineoplastic chemotherapy: Secondary | ICD-10-CM

## 2016-08-25 DIAGNOSIS — N183 Chronic kidney disease, stage 3 unspecified: Secondary | ICD-10-CM

## 2016-08-25 DIAGNOSIS — Z720 Tobacco use: Secondary | ICD-10-CM

## 2016-08-25 DIAGNOSIS — C670 Malignant neoplasm of trigone of bladder: Secondary | ICD-10-CM | POA: Diagnosis not present

## 2016-08-25 DIAGNOSIS — I952 Hypotension due to drugs: Secondary | ICD-10-CM

## 2016-08-25 LAB — COMPREHENSIVE METABOLIC PANEL
ALBUMIN: 3.3 g/dL — AB (ref 3.5–5.0)
ALK PHOS: 52 U/L (ref 38–126)
ALT: 13 U/L — AB (ref 17–63)
AST: 20 U/L (ref 15–41)
Anion gap: 3 — ABNORMAL LOW (ref 5–15)
BUN: 32 mg/dL — ABNORMAL HIGH (ref 6–20)
CALCIUM: 8.3 mg/dL — AB (ref 8.9–10.3)
CHLORIDE: 105 mmol/L (ref 101–111)
CO2: 25 mmol/L (ref 22–32)
CREATININE: 1.71 mg/dL — AB (ref 0.61–1.24)
GFR calc Af Amer: 46 mL/min — ABNORMAL LOW (ref >60)
GFR calc non Af Amer: 39 mL/min — ABNORMAL LOW (ref >60)
GLUCOSE: 105 mg/dL — AB (ref 65–99)
Potassium: 4.2 mmol/L (ref 3.5–5.1)
SODIUM: 133 mmol/L — AB (ref 135–145)
Total Bilirubin: 0.2 mg/dL — ABNORMAL LOW (ref 0.3–1.2)
Total Protein: 6.2 g/dL — ABNORMAL LOW (ref 6.5–8.1)

## 2016-08-25 LAB — CBC WITH DIFFERENTIAL/PLATELET
BASOS ABS: 0 10*3/uL (ref 0.0–0.1)
BASOS PCT: 0 %
EOS ABS: 0.1 10*3/uL (ref 0.0–0.7)
EOS PCT: 3 %
HCT: 34.5 % — ABNORMAL LOW (ref 39.0–52.0)
HEMOGLOBIN: 11.6 g/dL — AB (ref 13.0–17.0)
Lymphocytes Relative: 12 %
Lymphs Abs: 0.4 10*3/uL — ABNORMAL LOW (ref 0.7–4.0)
MCH: 32 pg (ref 26.0–34.0)
MCHC: 33.6 g/dL (ref 30.0–36.0)
MCV: 95 fL (ref 78.0–100.0)
Monocytes Absolute: 0.2 10*3/uL (ref 0.1–1.0)
Monocytes Relative: 8 %
NEUTROS PCT: 77 %
Neutro Abs: 2.4 10*3/uL (ref 1.7–7.7)
PLATELETS: 165 10*3/uL (ref 150–400)
RBC: 3.63 MIL/uL — AB (ref 4.22–5.81)
RDW: 15.3 % (ref 11.5–15.5)
WBC: 3.1 10*3/uL — AB (ref 4.0–10.5)

## 2016-08-25 MED ORDER — SODIUM CHLORIDE 0.9% FLUSH: 10.0000 mL | INTRAVENOUS | Status: DC | PRN

## 2016-08-25 MED ORDER — PALONOSETRON HCL INJECTION 0.25 MG/5ML
INTRAVENOUS | Status: AC
  Filled 2016-08-25: qty 5

## 2016-08-25 MED ORDER — SODIUM CHLORIDE 0.9 % IV SOLN
10.0000 mg | Freq: Once | INTRAVENOUS | Status: AC
  Administered 2016-08-25: 10 mg via INTRAVENOUS
  Filled 2016-08-25: qty 1

## 2016-08-25 MED ORDER — SODIUM CHLORIDE 0.9 % IV SOLN
Freq: Once | INTRAVENOUS | Status: AC
  Administered 2016-08-25: 15:00:00 via INTRAVENOUS

## 2016-08-25 MED ORDER — SODIUM CHLORIDE 0.9 % IV SOLN
151.0000 mg | Freq: Once | INTRAVENOUS | Status: AC
  Administered 2016-08-25: 150 mg via INTRAVENOUS
  Filled 2016-08-25: qty 15

## 2016-08-25 MED ORDER — PALONOSETRON HCL INJECTION 0.25 MG/5ML
0.2500 mg | Freq: Once | INTRAVENOUS | Status: AC
  Administered 2016-08-25: 0.25 mg via INTRAVENOUS

## 2016-08-25 NOTE — Patient Instructions (Addendum)
[place] at [facility] Discharge Instructions  RECOMMENDATIONS MADE BY THE CONSULTANT AND ANY TEST RESULTS WILL BE SENT TO YOUR REFERRING PHYSICIAN.  You saw Dr. Whitney Muse today. Come back to cancer center on Wednesday 9/27 for BP check. Take 1/2 of lisinopril/HCTZ at night. Fluid bolus today. Chemo on 10/9.  Thank you for choosing [place] at [facility] to provide your oncology and hematology care.  To afford each patient quality time with our provider, please arrive at least 15 minutes before your scheduled appointment time.   Beginning January 23rd 2017 lab work for the [facility] will be done in the  Main lab at [facility] on 1st floor. If you have a lab appointment with the [place] please come in thru the  Main Entrance and check in at the main information desk  You need to re-schedule your appointment should you arrive 10 or more minutes late.  We strive to give you quality time with our providers, and arriving late affects you and other patients whose appointments are after yours.  Also, if you no show three or more times for appointments you may be dismissed from the clinic at the providers discretion.     Again, thank you for choosing [facility].  Our hope is that these requests will decrease the amount of time that you wait before being seen by our physicians.       _____________________________________________________________  Should you have questions after your visit to [facility], please contact our office at [phone] between the hours of 8:30 a.m. and 4:30 p.m.  Voicemails left after 4:30 p.m. will not be returned until the following business day.  For prescription refill requests, have your pharmacy contact our office.         Resources For Cancer Patients and their Caregivers ? American Cancer Society: Can assist with transportation, wigs, general needs, runs Look Good Feel  Better.        9718191227 ? Cancer Care: Provides financial assistance, online support groups, medication/co-pay assistance.  1-800-813-HOPE 639-808-1415) ? [place] Assists [place] cancer patients and their families through emotional , educational and financial support.  [phone] ? [place] DSS Where to apply for food stamps, Medicaid and utility assistance. 319-168-5527 ? [employer]ATS: Transportation to medical appointments. ([phone] ? Social Security Administration: May apply for disability if have a Stage IV cancer. 540-299-8317 938-206-5033 ? [employer], Disability and Transit Services: Assists with nutrition, care and transit needs. [place] Support Programs: @10RELATIVEDAYS @ > Cancer Support Group  2nd Tuesday of the month 1pm-2pm, Journey Room  > Creative Journey  3rd Tuesday of the month 1130am-1pm, Journey Room  > Look Good Feel Better  1st Wednesday of the month 10am-12 noon, Journey Room (Call [place] to register [phone])

## 2016-08-25 NOTE — Progress Notes (Signed)
[name]  PROGRESS NOTE  Patient Care Team: [name], MD as PCP - General (Internal Medicine)  CHIEF COMPLAINTS/PURPOSE OF CONSULTATION:   Oncology History   Per available records had bladder tumor removed but had extension into the muscularis propria. CT showed no extension beyond the bladder. Has seen 2 urologists in consultation. Adamant about not having a cystectomy and ileal conduit.     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 No findings for local spread of disease or metastatic disease      03/25/2016 Surgery    TURBT 3cm right trigonal bladder tumor. followed by intravesical epirubicin instillation      03/25/2016 Pathology Results    bladder TURBT infiltrative high grade papillary urothelial carcinoma, invades the mulcularis propria (detrusor) no LVI       04/14/2016 Miscellaneous    [name] consultation in [place] for consideration of cystectomy. patient also offered repeat resection with BCG, and CHEMO/XRT      06/17/2016 Pathology Results    TURB- high grade papillary urothelial carcinoma with focal stromal invasion      07/22/2016 -  Chemotherapy    The patient had palonosetron (ALOXI) injection 0.25 mg, 0.25 mg, Intravenous,  Once, 2 of 5 cycles Administration: 0.25 mg (07/28/2016)  CARBOplatin (PARAPLATIN) 160 mg in sodium chloride 0.9 % 100 mL chemo infusion, 160 mg (100 % of original dose 160.6 mg), Intravenous,  Once, 2 of 5 cycles Dose modification:   (original dose 160.6 mg, Cycle 1),   (original dose 160.6 mg, Cycle 1),   (original dose 160.6 mg, Cycle 1),   (original dose 160.6 mg, Cycle 2) Administration: 180 mg (07/28/2016)  for chemotherapy treatment.         Radiation Therapy    Planned to be completed on 09/10/2016.      COPD Hypertensive cardiomyopathy  HISTORY OF PRESENTING ILLNESS:  Carl Murphy 70 y.o. male is  here for ongoing follow-up of muscle invasive carcinoma of the bladder.   Carl Murphy is unaccompanied. He is here for Cycle #6 Carboplatin. He has ongoing XRT. He missed one day of radiation secondary to diarrhea. He went to the store and got imodium which managed it. He has had regular bowel movements since.   I spoke with the patient about his low blood pressure. He takes one coreg in the morning and one at night. He takes his hyzaar at night. He denies dizziness or light headedness.   He feels good, "actually better than when I started all this".   He denies vomiting. He denies falling or feeling dizzy.  He continues to smoke. He denies any nausea or vomiting. Appetite is unchanged. Energy is stable.   MEDICAL HISTORY:  Past Medical History:  Diagnosis Date  . Arthritis   . Cancer [facility])    Bladder Cancer  . CHF (congestive heart failure) ([place])   . COPD (chronic obstructive pulmonary disease) ([place])   . Depression   . Hard of hearing    bilat   . Hypertension   . Malignant neoplasm of trigone of bladder ([place]) 03/25/2016  . Shortness of breath dyspnea    heat; walking up set of stairs; pt states can walk up 12-14 stairs slowly without having to stop to catch his breath    SURGICAL HISTORY: Past Surgical History:  Procedure Laterality Date  . CYSTOSCOPY N/A 06/16/2016  Procedure: CYSTOSCOPY;  Surgeon: Franchot Gallo, MD;  Location: [facility];  Service: Urology;  Laterality: N/A;  . TRANSURETHRAL RESECTION OF BLADDER TUMOR N/A 03/25/2016   Procedure: TRANSURETHRAL RESECTION OF BLADDER TUMOR (TURBT);  Surgeon: Franchot Gallo, MD;  Location: [facility];  Service: Urology;  Laterality: N/A;  . TRANSURETHRAL RESECTION OF BLADDER TUMOR N/A 06/16/2016   Procedure: TRANSURETHRAL RESECTION OF BLADDER TUMOR (TURBT);  Surgeon: Franchot Gallo, MD;  Location: [facility];  Service: Urology;  Laterality: N/A;    SOCIAL HISTORY: Social History   Social History  . Marital status: Married     Spouse name: N/A  . Number of children: N/A  . Years of education: N/A   Occupational History  . Not on file.   Social History Main Topics  . Smoking status: Current Every Day Smoker    Packs/day: 2.00    Years: 50.00    Types: Cigarettes  . Smokeless tobacco: Never Used  . Alcohol use No     Comment: rarestop drinking several years ago; then begin to drink again for approx 3 wks but has currently stopped  . Drug use: No  . Sexual activity: No   Other Topics Concern  . Not on file   Social History Narrative  . 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 Hypertension Mother   . Asthma Father   . Emphysema Father     Both parents deceased Mom was early 46's when she died; he was around 80-something Dad had asthma and emphysema really bad; thinks his heart finally gave out He says his mother's "intestines broke" and it was "fast forward downhill from there."  He is an only child.   ALLERGIES:  has No Known Allergies.  MEDICATIONS:  Current Outpatient Prescriptions  Medication Sig Dispense Refill  . aspirin 325 MG tablet Take 325 mg by mouth daily.    Marland Kitchen buPROPion (WELLBUTRIN XL) 150 MG 24 hr tablet   2  . carvedilol (COREG) 25 MG tablet Take 1 tablet by mouth 2 (two) times daily.  11  . citalopram (CELEXA) 20 MG tablet Take 20 mg by mouth at bedtime.   11  . loratadine (CLARITIN) 10 MG tablet Take 10 mg by mouth daily as needed for allergies.    Marland Kitchen  losartan-hydrochlorothiazide (HYZAAR) 100-25 MG tablet Take 1 tablet by mouth at bedtime.   11  . ondansetron (ZOFRAN) 8 MG tablet Take 1 tablet (8 mg total) by mouth every 8 (eight) hours as needed for nausea or vomiting. 30 tablet 2  . prochlorperazine (COMPAZINE) 10 MG tablet Take 1 tablet (10 mg total) by mouth every 6 (six) hours as needed for nausea or vomiting. 30 tablet 2  . promethazine (PHENERGAN) 25 MG suppository Place 1 suppository (25 mg total) rectally every 6 (six) hours as needed for nausea or vomiting. 5 each 0   No current facility-administered medications for this visit.     Review of Systems  Constitutional: Negative.  Negative for chills, diaphoresis, fever, malaise/fatigue and weight loss.  HENT: Negative.  Negative for congestion, ear discharge, ear pain, hearing loss, nosebleeds, sore throat and tinnitus.   Eyes: Negative.  Negative for blurred vision, double vision, photophobia, pain, discharge and redness.  Respiratory: Positive for cough and shortness of breath. Negative for hemoptysis, wheezing and stridor.        Says he has a smoker's cough.  Cardiovascular: Negative.  Negative for chest pain, palpitations, orthopnea, claudication, leg swelling and PND.  Gastrointestinal: Positive for diarrhea. Negative for abdominal pain, blood in stool, constipation, heartburn, melena, nausea and vomiting.       One day of diarrhea managed with imodium  Genitourinary: Negative for flank pain, frequency and urgency.  Musculoskeletal: Positive for joint pain. Negative for back pain, falls, myalgias and neck pain.  Skin: Negative.  Negative for itching and rash.  Neurological: Negative.  Negative for dizziness, tingling, tremors, sensory change, speech change, focal weakness, seizures, loss of consciousness, weakness and headaches.  Endo/Heme/Allergies: Negative.  Negative for environmental allergies and polydipsia. Does not bruise/bleed easily.  Psychiatric/Behavioral: Negative.   Negative for depression, hallucinations, memory loss, substance abuse and suicidal ideas. The patient is not nervous/anxious and does not have insomnia.   All other systems reviewed and are negative. 14 point ROS was done and is otherwise as detailed above or in HPI    PHYSICAL EXAMINATION: ECOG PERFORMANCE STATUS: 1 - Symptomatic but completely ambulatory  Vitals:   08/25/16 1340  BP: (!) 97/52  Pulse: 69  Resp: 18  Temp: 98.6 F (37 C)   Filed Weights   08/25/16 1340  Weight: 185 lb 3.2 oz (84 kg)    Physical Exam  Constitutional: He is oriented to person, place, and time. No distress.  HENT:  Head: Normocephalic and atraumatic.  Mouth/Throat: Oropharynx is clear and moist. No oropharyngeal exudate.  Eyes: Conjunctivae and EOM are normal. Pupils are equal, round, and reactive to light. Right eye exhibits no discharge. Left eye exhibits no discharge. No scleral icterus.  Neck: Normal range of motion. Neck supple. No tracheal deviation present. No thyromegaly present.  Cardiovascular: Normal rate, regular rhythm and normal heart sounds.   No murmur heard. Pulmonary/Chest: Effort normal. No respiratory distress. He has wheezes.  Abdominal: Soft. Bowel sounds are normal. He exhibits no distension and no mass. There is no tenderness. There is no rebound and no guarding.  Umbilical hernia present.  Musculoskeletal: Normal range of motion. He exhibits no edema or tenderness.  Lymphadenopathy:    He has no cervical adenopathy.  Neurological: He is alert and oriented to person, place, and time. No cranial nerve deficit. Gait normal. Coordination normal.  Skin: Skin is warm and dry. No rash noted. He is not diaphoretic. No erythema.  Psychiatric: Mood, memory, affect and judgment normal.  Nursing note and vitals reviewed.   LABORATORY DATA:  I have reviewed the data as listed Lab Results  Component Value Date   WBC 3.1 (L) 08/25/2016   HGB 11.6 (L) 08/25/2016   HCT 34.5 (L)  08/25/2016   MCV 95.0 08/25/2016   PLT 165 08/25/2016   CMP     Component Value Date/Time   NA 133 (L) 08/25/2016 1240   K 4.2 08/25/2016 1240   CL 105 08/25/2016 1240   CO2 25 08/25/2016 1240   GLUCOSE 105 (H) 08/25/2016 1240   BUN 32 (H) 08/25/2016 1240   CREATININE 1.71 (H) 08/25/2016 1240   CALCIUM 8.3 (L) 08/25/2016 1240  PROT 6.2 (L) 08/25/2016 1240   ALBUMIN 3.3 (L) 08/25/2016 1240   AST 20 08/25/2016 1240   ALT 13 (L) 08/25/2016 1240   ALKPHOS 52 08/25/2016 1240   BILITOT 0.2 (L) 08/25/2016 1240   GFRNONAA 39 (L) 08/25/2016 1240   GFRAA 46 (L) 08/25/2016 1240     RADIOGRAPHIC STUDIES: I have personally reviewed the radiological images as listed and agreed with the findings in the report. CLINICAL DATA: Hematuria and frequent urination.  EXAM: CT ABDOMEN AND PELVIS WITHOUT AND WITH CONTRAST  TECHNIQUE: Multidetector CT imaging of the abdomen and pelvis was performed following the standard protocol before and following the bolus administration of intravenous contrast.  CONTRAST: 170mL OMNIPAQUE IOHEXOL 300 MG/ML SOLN  COMPARISON: CT scan 03/31/2013  FINDINGS: Lower chest: The lung bases are clear of acute process. No worrisome pulmonary lesions. No pleural effusion. The heart is normal in size. No pericardial effusion. Coronary artery calcifications are noted. Dense distal aortic calcifications are noted. The esophagus is grossly normal.  Hepatobiliary: Small low-attenuation liver lesions are stable and likely small cysts. Lesion and segment 8 is also stable and likely a hemangioma. No worrisome hepatic lesions or intrahepatic biliary dilatation. The gallbladder is normal. No common bile duct dilatation.  Pancreas: No mass, inflammation or ductal dilatation.  Spleen: Normal size. No focal lesions.  Adrenals/Urinary Tract: The adrenal glands are normal and stable.  Numerous left renal artery calcifications. I do not see any  definite calculi in the left collecting system. Small left renal cysts are noted. The no right renal calculi or hydronephrosis. Both ureters are normal. No ureteral calculi. No significant collecting system abnormality on the delayed images.  There is a large irregular bladder mass at on the right side. This demonstrates moderate contrast enhancement. It measures 3.2 x 3.0 cm. No ureteral obstruction.  Stomach/Bowel: The stomach, duodenum, small bowel and colon are unremarkable without oral contrast. No inflammatory changes, mass lesions or obstructive findings. The terminal ileum is normal. The appendix is normal. Moderate sigmoid diverticulosis without findings for acute diverticulitis.  Vascular/Lymphatic: Advanced aortic and branch vessel atherosclerotic calcifications. No focal aneurysm or dissection. The major venous structures are patent.  No mesenteric or retroperitoneal mass or lymphadenopathy. Small scattered lymph nodes are noted. Small bilateral external iliac artery lymph nodes are stable. No pelvic lymphadenopathy. No inguinal adenopathy. The prostate gland and seminal vesicles are grossly normal. New line small  Small Periumbilical abdominal wall hernia containing fat and a small bowel loop. This has enlarged since prior CT scan.  Other: No subcutaneous lesions or ascites.  Musculoskeletal: No significant bony findings.  IMPRESSION: 1. 3.2 x 3.0 cm bladder mass consistent with transitional cell carcinoma. No findings for local spread of disease or metastatic disease. 2. No worrisome renal or ureteral lesions. 3. Small benign-appearing and stable hepatic lesions. 4. Advanced atherosclerotic calcifications involving the aorta and branch vessels. 5. Small periumbilical abdominal wall hernia containing fat and a small bowel loop.   Electronically Signed  By: Marijo Sanes M.D.  On: 02/18/2016 16:47   ASSESSMENT & PLAN:  Muscle invasive  carcinoma of the trigone of the bladder Tobacco Abuse CKD Anemia Hypotension  He will continue with weekly carboplatin and concurrent XRT. Has done well so far. He will return next week for labs and ongoing therapy.   I addressed the importance of smoking cessation with the patient in detail.  We discussed the health benefits of cessation.  We discussed the health detriments of ongoing tobacco use including but  not limited to COPD, heart disease and malignancy. We reviewed the multiple options for cessation and I offered to refer him to smoking cessation classes. We discussed other alternatives to quit such as chantix, wellbutrin. We will continue to address this moving forward. He is very straightforward in regards to his unwillingness to quit.   He does not need any refills at this time.  He will return for follow up with his next cycle of therapy. Low BP noted (97/52). Advised for the patient to take only half Losartan tonight. He is to return in several days for a repeat BP check. We will also give him a 500 cc bolus of NS today. He was given written instructions on how to take his blood pressure medications.  All questions were answered. The patient knows to call the clinic with any problems, questions or concerns.  This document serves as a record of services personally performed by Ancil Linsey, MD. It was created on her behalf by Arlyce Harman, a trained medical scribe. The creation of this record is based on the scribe's personal observations and the provider's statements to them. This document has been checked and approved by the attending provider.  I have reviewed the above documentation for accuracy and completeness and I agree with the above.  This note was electronically signed.    Molli Hazard, MD  08/25/2016 1:48 PM

## 2016-08-25 NOTE — Patient Instructions (Signed)
[facility] Discharge Instructions for Patients Receiving Chemotherapy   Beginning January 23rd 2017 lab work for the [facility] will be done in the  Main lab at [facility] on 1st floor. If you have a lab appointment with the [facility] please come in thru the  Main Entrance and check in at the main information desk   Today you received the following chemotherapy agent: Carboplatin.     If you develop nausea and vomiting, or diarrhea that is not controlled by your medication, call the clinic.  The clinic phone number is [phone]. Office hours are Monday-Friday 8:30am-5:00pm.  BELOW ARE SYMPTOMS THAT SHOULD BE REPORTED IMMEDIATELY:  *FEVER GREATER THAN 101.0 F  *CHILLS WITH OR WITHOUT FEVER  NAUSEA AND VOMITING THAT IS NOT CONTROLLED WITH YOUR NAUSEA MEDICATION  *UNUSUAL SHORTNESS OF BREATH  *UNUSUAL BRUISING OR BLEEDING  TENDERNESS IN MOUTH AND THROAT WITH OR WITHOUT PRESENCE OF ULCERS  *URINARY PROBLEMS  *BOWEL PROBLEMS  UNUSUAL RASH Items with * indicate a potential emergency and should be followed up as soon as possible. If you have an emergency after office hours please contact your primary care physician or go to the nearest emergency department.  Please call the clinic during office hours if you have any questions or concerns.   You may also contact the Patient Navigator at [phone] should you have any questions or need assistance in obtaining follow up care.      Resources For Cancer Patients and their Caregivers ? American Cancer Society: Can assist with transportation, wigs, general needs, runs Look Good Feel Better.        804-717-5887 ? Cancer Care: Provides financial assistance, online support groups, medication/co-pay assistance.  1-800-813-HOPE (605) 284-8843) ? [facility] Assists [place] cancer patients and their families through emotional , educational and financial support.   253-048-8087 ? [place] DSS Where to apply for food stamps, Medicaid and utility assistance. 571-073-9043 ? [facility]: Transportation to medical appointments. 303 169 5483 ? Social Security Administration: May apply for disability if have a Stage IV cancer. 4751048043 401-441-0261 ? [employer], Disability and Transit Services: Assists with nutrition, care and transit needs. [phone]

## 2016-08-25 NOTE — Progress Notes (Signed)
Labs discussed with MD prior to releasing and administering chemotherapy.  MD had also seen the patient prior to releasing chemotherapy.  NS bolus administered per MD request.  Patient given written and oral education regarding his schedule changes and additional round of chemotherapy as well as medication change information per Dr. Lemoyne Nestor Muse.  Patient tolerated infusion well.  VSS.  Patient ambulatory and stable upon discharge from clinic.

## 2016-08-26 ENCOUNTER — Encounter (HOSPITAL_COMMUNITY): Payer: Self-pay | Admitting: Hematology & Oncology

## 2016-08-27 ENCOUNTER — Encounter (HOSPITAL_COMMUNITY): Payer: Medicare Other

## 2016-08-27 ENCOUNTER — Telehealth (HOSPITAL_COMMUNITY): Payer: Self-pay

## 2016-08-27 NOTE — Telephone Encounter (Signed)
Patient came by for scheduled BP check. His vitals were:  98.3   - Temperature    64    -  Pulse    18    -  Respirations 136/66 - BP    94%  - O2 Sat  Patient left floor ambulatory in stable condition.

## 2016-09-01 ENCOUNTER — Encounter (HOSPITAL_COMMUNITY): Payer: Medicare Other | Attending: Hematology & Oncology

## 2016-09-01 ENCOUNTER — Other Ambulatory Visit (HOSPITAL_COMMUNITY): Payer: Self-pay | Admitting: Oncology

## 2016-09-01 ENCOUNTER — Encounter (HOSPITAL_COMMUNITY): Payer: Medicare Other

## 2016-09-01 VITALS — BP 117/57 | HR 63 | Temp 97.4°F | Resp 18 | Wt 182.6 lb

## 2016-09-01 DIAGNOSIS — C670 Malignant neoplasm of trigone of bladder: Secondary | ICD-10-CM | POA: Diagnosis present

## 2016-09-01 DIAGNOSIS — Z5111 Encounter for antineoplastic chemotherapy: Secondary | ICD-10-CM

## 2016-09-01 LAB — CBC WITH DIFFERENTIAL/PLATELET
Basophils Absolute: 0 10*3/uL (ref 0.0–0.1)
Basophils Relative: 0 %
EOS ABS: 0.1 10*3/uL (ref 0.0–0.7)
Eosinophils Relative: 5 %
HEMATOCRIT: 35.8 % — AB (ref 39.0–52.0)
HEMOGLOBIN: 11.9 g/dL — AB (ref 13.0–17.0)
LYMPHS ABS: 0.4 10*3/uL — AB (ref 0.7–4.0)
LYMPHS PCT: 19 %
MCH: 31.6 pg (ref 26.0–34.0)
MCHC: 33.2 g/dL (ref 30.0–36.0)
MCV: 95.2 fL (ref 78.0–100.0)
MONOS PCT: 11 %
Monocytes Absolute: 0.2 10*3/uL (ref 0.1–1.0)
NEUTROS PCT: 65 %
Neutro Abs: 1.4 10*3/uL — ABNORMAL LOW (ref 1.7–7.7)
Platelets: 205 10*3/uL (ref 150–400)
RBC: 3.76 MIL/uL — ABNORMAL LOW (ref 4.22–5.81)
RDW: 15.6 % — ABNORMAL HIGH (ref 11.5–15.5)
WBC: 2.1 10*3/uL — ABNORMAL LOW (ref 4.0–10.5)

## 2016-09-01 LAB — COMPREHENSIVE METABOLIC PANEL
ALK PHOS: 55 U/L (ref 38–126)
ALT: 14 U/L — ABNORMAL LOW (ref 17–63)
ANION GAP: 8 (ref 5–15)
AST: 21 U/L (ref 15–41)
Albumin: 3.5 g/dL (ref 3.5–5.0)
BILIRUBIN TOTAL: 0.3 mg/dL (ref 0.3–1.2)
BUN: 32 mg/dL — ABNORMAL HIGH (ref 6–20)
CALCIUM: 8.6 mg/dL — AB (ref 8.9–10.3)
CO2: 27 mmol/L (ref 22–32)
CREATININE: 1.49 mg/dL — AB (ref 0.61–1.24)
Chloride: 102 mmol/L (ref 101–111)
GFR, EST AFRICAN AMERICAN: 54 mL/min — AB (ref >60)
GFR, EST NON AFRICAN AMERICAN: 46 mL/min — AB (ref >60)
Glucose, Bld: 92 mg/dL (ref 65–99)
Potassium: 4 mmol/L (ref 3.5–5.1)
SODIUM: 137 mmol/L (ref 135–145)
TOTAL PROTEIN: 6.5 g/dL (ref 6.5–8.1)

## 2016-09-01 MED ORDER — SODIUM CHLORIDE 0.9 % IV SOLN
Freq: Once | INTRAVENOUS | Status: AC
  Administered 2016-09-01: 14:00:00 via INTRAVENOUS

## 2016-09-01 MED ORDER — DEXAMETHASONE SODIUM PHOSPHATE 100 MG/10ML IJ SOLN
10.0000 mg | Freq: Once | INTRAMUSCULAR | Status: AC
  Administered 2016-09-01: 10 mg via INTRAVENOUS
  Filled 2016-09-01: qty 1

## 2016-09-01 MED ORDER — SODIUM CHLORIDE 0.9% FLUSH
10.0000 mL | INTRAVENOUS | Status: DC | PRN
  Administered 2016-09-01: 10 mL
  Filled 2016-09-01: qty 10

## 2016-09-01 MED ORDER — SODIUM CHLORIDE 0.9 % IV SOLN
165.8000 mg | Freq: Once | INTRAVENOUS | Status: AC
  Administered 2016-09-01: 170 mg via INTRAVENOUS
  Filled 2016-09-01: qty 17

## 2016-09-01 MED ORDER — PALONOSETRON HCL INJECTION 0.25 MG/5ML
0.2500 mg | Freq: Once | INTRAVENOUS | Status: AC
  Administered 2016-09-01: 0.25 mg via INTRAVENOUS
  Filled 2016-09-01: qty 5

## 2016-09-01 NOTE — Progress Notes (Signed)
Carl Murphy tolerated chemo well without complaints or incident. Labs including ANC 1.4 reviewed with Dr. Whitney Muse and chemo was approved for today per MD.  VSS upon discharge. Pt discharged self ambulatory in satisfactory condition

## 2016-09-01 NOTE — Patient Instructions (Signed)
[facility] Discharge Instructions for Patients Receiving Chemotherapy   Beginning January 23rd 2017 lab work for the Cancer Center will be done in the  Main lab at [facility] on 1st floor. If you have a lab appointment with the Cancer Center please come in thru the  Main Entrance and check in at the main information desk   Today you received the following chemotherapy agents Carboplatin. Follow-up as scheduled. Call clinic for any questions or concerns  To help prevent nausea and vomiting after your treatment, we encourage you to take your nausea medication   If you develop nausea and vomiting, or diarrhea that is not controlled by your medication, call the clinic.  The clinic phone number is [phone]. Office hours are Monday-Friday 8:30am-5:00pm.  BELOW ARE SYMPTOMS THAT SHOULD BE REPORTED IMMEDIATELY:  *FEVER GREATER THAN 101.0 F  *CHILLS WITH OR WITHOUT FEVER  NAUSEA AND VOMITING THAT IS NOT CONTROLLED WITH YOUR NAUSEA MEDICATION  *UNUSUAL SHORTNESS OF BREATH  *UNUSUAL BRUISING OR BLEEDING  TENDERNESS IN MOUTH AND THROAT WITH OR WITHOUT PRESENCE OF ULCERS  *URINARY PROBLEMS  *BOWEL PROBLEMS  UNUSUAL RASH Items with * indicate a potential emergency and should be followed up as soon as possible. If you have an emergency after office hours please contact your primary care physician or go to the nearest emergency department.  Please call the clinic during office hours if you have any questions or concerns.   You may also contact the Patient Navigator at [phone] should you have any questions or need assistance in obtaining follow up care.      Resources For Cancer Patients and their Caregivers ? American Cancer Society: Can assist with transportation, wigs, general needs, runs Look Good Feel Better.        1-888-227-6333 ? Cancer Care: Provides financial assistance, online support groups, medication/co-pay assistance.  1-800-813-HOPE  (4673) ? [facility] Assists [place] cancer patients and their families through emotional , educational and financial support.  336-427-4357 ? [place] DSS Where to apply for food stamps, Medicaid and utility assistance. 336-342-1394 ? [facility]: Transportation to medical appointments. 336-347-2287 ? Social Security Administration: May apply for disability if have a Stage IV cancer. 336-342-7796 1-800-772-1213 ? [place] Aging, Disability and Transit Services: Assists with nutrition, care and transit needs. [phone]         

## 2016-09-08 ENCOUNTER — Encounter (HOSPITAL_BASED_OUTPATIENT_CLINIC_OR_DEPARTMENT_OTHER): Payer: Medicare Other

## 2016-09-08 ENCOUNTER — Other Ambulatory Visit (HOSPITAL_COMMUNITY): Payer: Self-pay | Admitting: Hematology & Oncology

## 2016-09-08 ENCOUNTER — Encounter (HOSPITAL_COMMUNITY): Payer: Medicare Other

## 2016-09-08 ENCOUNTER — Encounter (HOSPITAL_COMMUNITY): Payer: Self-pay

## 2016-09-08 VITALS — BP 106/52 | HR 62 | Temp 98.0°F | Resp 18 | Wt 183.2 lb

## 2016-09-08 DIAGNOSIS — Z5111 Encounter for antineoplastic chemotherapy: Secondary | ICD-10-CM | POA: Diagnosis not present

## 2016-09-08 DIAGNOSIS — C670 Malignant neoplasm of trigone of bladder: Secondary | ICD-10-CM | POA: Diagnosis not present

## 2016-09-08 LAB — COMPREHENSIVE METABOLIC PANEL
ALBUMIN: 3.4 g/dL — AB (ref 3.5–5.0)
ALK PHOS: 61 U/L (ref 38–126)
ALT: 14 U/L — ABNORMAL LOW (ref 17–63)
ANION GAP: 8 (ref 5–15)
AST: 22 U/L (ref 15–41)
BILIRUBIN TOTAL: 0.4 mg/dL (ref 0.3–1.2)
BUN: 31 mg/dL — ABNORMAL HIGH (ref 6–20)
CALCIUM: 8.8 mg/dL — AB (ref 8.9–10.3)
CO2: 26 mmol/L (ref 22–32)
Chloride: 103 mmol/L (ref 101–111)
Creatinine, Ser: 1.62 mg/dL — ABNORMAL HIGH (ref 0.61–1.24)
GFR calc Af Amer: 49 mL/min — ABNORMAL LOW (ref >60)
GFR, EST NON AFRICAN AMERICAN: 42 mL/min — AB (ref >60)
GLUCOSE: 117 mg/dL — AB (ref 65–99)
POTASSIUM: 3.6 mmol/L (ref 3.5–5.1)
Sodium: 137 mmol/L (ref 135–145)
TOTAL PROTEIN: 6.4 g/dL — AB (ref 6.5–8.1)

## 2016-09-08 LAB — CBC WITH DIFFERENTIAL/PLATELET
BASOS ABS: 0 10*3/uL (ref 0.0–0.1)
BASOS PCT: 0 %
EOS ABS: 0 10*3/uL (ref 0.0–0.7)
Eosinophils Relative: 2 %
HEMATOCRIT: 35.1 % — AB (ref 39.0–52.0)
HEMOGLOBIN: 11.9 g/dL — AB (ref 13.0–17.0)
Lymphocytes Relative: 19 %
Lymphs Abs: 0.5 10*3/uL — ABNORMAL LOW (ref 0.7–4.0)
MCH: 32.2 pg (ref 26.0–34.0)
MCHC: 33.9 g/dL (ref 30.0–36.0)
MCV: 94.9 fL (ref 78.0–100.0)
MONO ABS: 0.4 10*3/uL (ref 0.1–1.0)
Monocytes Relative: 17 %
NEUTROS ABS: 1.6 10*3/uL — AB (ref 1.7–7.7)
NEUTROS PCT: 62 %
Platelets: 183 10*3/uL (ref 150–400)
RBC: 3.7 MIL/uL — ABNORMAL LOW (ref 4.22–5.81)
RDW: 15.7 % — AB (ref 11.5–15.5)
WBC: 2.6 10*3/uL — ABNORMAL LOW (ref 4.0–10.5)

## 2016-09-08 MED ORDER — HEPARIN SOD (PORK) LOCK FLUSH 100 UNIT/ML IV SOLN
INTRAVENOUS | Status: AC
  Filled 2016-09-08: qty 5

## 2016-09-08 MED ORDER — SODIUM CHLORIDE 0.9 % IV SOLN
156.6000 mg | Freq: Once | INTRAVENOUS | Status: AC
  Administered 2016-09-08: 160 mg via INTRAVENOUS
  Filled 2016-09-08: qty 16

## 2016-09-08 MED ORDER — PALONOSETRON HCL INJECTION 0.25 MG/5ML
INTRAVENOUS | Status: AC
  Filled 2016-09-08: qty 5

## 2016-09-08 MED ORDER — SODIUM CHLORIDE 0.9 % IV SOLN
10.0000 mg | Freq: Once | INTRAVENOUS | Status: AC
  Administered 2016-09-08: 10 mg via INTRAVENOUS
  Filled 2016-09-08: qty 1

## 2016-09-08 MED ORDER — SODIUM CHLORIDE 0.9 % IV SOLN
Freq: Once | INTRAVENOUS | Status: AC
  Administered 2016-09-08: 14:00:00 via INTRAVENOUS

## 2016-09-08 MED ORDER — PALONOSETRON HCL INJECTION 0.25 MG/5ML
0.2500 mg | Freq: Once | INTRAVENOUS | Status: AC
  Administered 2016-09-08: 0.25 mg via INTRAVENOUS

## 2016-09-08 NOTE — Patient Instructions (Signed)
[facility] Discharge Instructions for Patients Receiving Chemotherapy   Beginning January 23rd 2017 lab work for the [facility] will be done in the  Main lab at [facility] on 1st floor. If you have a lab appointment with the [facility] please come in thru the  Main Entrance and check in at the main information desk   Today you received the following chemotherapy agents [place].  To help prevent nausea and vomiting after your treatment, we encourage you to take your nausea medication     If you develop nausea and vomiting, or diarrhea that is not controlled by your medication, call the clinic.  The clinic phone number is [phone]. Office hours are Monday-Friday 8:30am-5:00pm.  BELOW ARE SYMPTOMS THAT SHOULD BE REPORTED IMMEDIATELY:  *FEVER GREATER THAN 101.0 F  *CHILLS WITH OR WITHOUT FEVER  NAUSEA AND VOMITING THAT IS NOT CONTROLLED WITH YOUR NAUSEA MEDICATION  *UNUSUAL SHORTNESS OF BREATH  *UNUSUAL BRUISING OR BLEEDING  TENDERNESS IN MOUTH AND THROAT WITH OR WITHOUT PRESENCE OF ULCERS  *URINARY PROBLEMS  *BOWEL PROBLEMS  UNUSUAL RASH Items with * indicate a potential emergency and should be followed up as soon as possible. If you have an emergency after office hours please contact your primary care physician or go to the nearest emergency department.  Please call the clinic during office hours if you have any questions or concerns.   You may also contact the Patient Navigator at [phone] should you have any questions or need assistance in obtaining follow up care.      Resources For Cancer Patients and their Caregivers ? American Cancer Society: Can assist with transportation, wigs, general needs, runs Look Good Feel Better.        (825)425-4584 ? Cancer Care: Provides financial assistance, online support groups, medication/co-pay assistance.  1-800-813-HOPE 2507607929) ? [facility] Assists [place]  cancer patients and their families through emotional , educational and financial support.  228-247-0382 ? [place] DSS Where to apply for food stamps, Medicaid and utility assistance. 445-261-4588 ? [facility]: Transportation to medical appointments. 438-126-3647 ? Social Security Administration: May apply for disability if have a Stage IV cancer. 5096918047 262-058-1090 ? [employer], Disability and Transit Services: Assists with nutrition, care and transit needs. 604-424-7505

## 2016-09-08 NOTE — Progress Notes (Signed)
Chemotherapy given today per orders. Patient tolerated it well. Vitals stable and discharged home from clinic, amublatory, self.

## 2016-10-06 ENCOUNTER — Encounter (HOSPITAL_COMMUNITY): Payer: Medicare Other

## 2016-10-06 ENCOUNTER — Encounter (HOSPITAL_COMMUNITY): Payer: Self-pay | Admitting: Hematology & Oncology

## 2016-10-06 ENCOUNTER — Encounter (HOSPITAL_COMMUNITY): Payer: Medicare Other | Attending: Hematology & Oncology | Admitting: Hematology & Oncology

## 2016-10-06 VITALS — BP 116/64 | HR 74 | Temp 97.9°F | Resp 18 | Wt 181.8 lb

## 2016-10-06 DIAGNOSIS — Z720 Tobacco use: Secondary | ICD-10-CM | POA: Diagnosis not present

## 2016-10-06 DIAGNOSIS — N183 Chronic kidney disease, stage 3 unspecified: Secondary | ICD-10-CM

## 2016-10-06 DIAGNOSIS — C670 Malignant neoplasm of trigone of bladder: Secondary | ICD-10-CM

## 2016-10-06 DIAGNOSIS — D630 Anemia in neoplastic disease: Secondary | ICD-10-CM | POA: Diagnosis not present

## 2016-10-06 LAB — COMPREHENSIVE METABOLIC PANEL
ALBUMIN: 3.6 g/dL (ref 3.5–5.0)
ALT: 12 U/L — ABNORMAL LOW (ref 17–63)
ANION GAP: 7 (ref 5–15)
AST: 22 U/L (ref 15–41)
Alkaline Phosphatase: 64 U/L (ref 38–126)
BUN: 24 mg/dL — ABNORMAL HIGH (ref 6–20)
CALCIUM: 8.7 mg/dL — AB (ref 8.9–10.3)
CHLORIDE: 100 mmol/L — AB (ref 101–111)
CO2: 26 mmol/L (ref 22–32)
Creatinine, Ser: 1.56 mg/dL — ABNORMAL HIGH (ref 0.61–1.24)
GFR calc non Af Amer: 44 mL/min — ABNORMAL LOW (ref >60)
GFR, EST AFRICAN AMERICAN: 51 mL/min — AB (ref >60)
Glucose, Bld: 94 mg/dL (ref 65–99)
POTASSIUM: 4.2 mmol/L (ref 3.5–5.1)
SODIUM: 133 mmol/L — AB (ref 135–145)
Total Bilirubin: 0.3 mg/dL (ref 0.3–1.2)
Total Protein: 7.2 g/dL (ref 6.5–8.1)

## 2016-10-06 LAB — CBC WITH DIFFERENTIAL/PLATELET
BASOS PCT: 0 %
Basophils Absolute: 0 10*3/uL (ref 0.0–0.1)
EOS ABS: 0 10*3/uL (ref 0.0–0.7)
EOS PCT: 1 %
HCT: 33.9 % — ABNORMAL LOW (ref 39.0–52.0)
Hemoglobin: 11.3 g/dL — ABNORMAL LOW (ref 13.0–17.0)
LYMPHS ABS: 0.7 10*3/uL (ref 0.7–4.0)
Lymphocytes Relative: 15 %
MCH: 32.3 pg (ref 26.0–34.0)
MCHC: 33.3 g/dL (ref 30.0–36.0)
MCV: 96.9 fL (ref 78.0–100.0)
MONOS PCT: 13 %
Monocytes Absolute: 0.6 10*3/uL (ref 0.1–1.0)
Neutro Abs: 3.3 10*3/uL (ref 1.7–7.7)
Neutrophils Relative %: 71 %
PLATELETS: 271 10*3/uL (ref 150–400)
RBC: 3.5 MIL/uL — ABNORMAL LOW (ref 4.22–5.81)
RDW: 14.9 % (ref 11.5–15.5)
WBC: 4.7 10*3/uL (ref 4.0–10.5)

## 2016-10-06 NOTE — Progress Notes (Signed)
Carl Murphy  PROGRESS NOTE  Patient Care Team: [name], MD as PCP - General (Internal Medicine)  CHIEF COMPLAINTS/PURPOSE OF CONSULTATION:   Oncology History   Per available records had bladder tumor removed but had extension into the muscularis propria. CT showed no extension beyond the bladder. Has seen 2 urologists in consultation. Adamant about not having a cystectomy and ileal conduit.     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 No findings for local spread of disease or metastatic disease      03/25/2016 Surgery    TURBT 3cm right trigonal bladder tumor. followed by intravesical epirubicin instillation      03/25/2016 Pathology Results    bladder TURBT infiltrative high grade papillary urothelial carcinoma, invades the mulcularis propria (detrusor) no LVI       04/14/2016 Miscellaneous    [name] consultation in [place] for consideration of cystectomy. patient also offered repeat resection with BCG, and CHEMO/XRT      06/17/2016 Pathology Results    TURB- high grade papillary urothelial carcinoma with focal stromal invasion      07/22/2016 -  Chemotherapy    The patient had palonosetron (ALOXI) injection 0.25 mg, 0.25 mg, Intravenous,  Once, 2 of 5 cycles Administration: 0.25 mg (07/28/2016)  CARBOplatin (PARAPLATIN) 160 mg in sodium chloride 0.9 % 100 mL chemo infusion, 160 mg (100 % of original dose 160.6 mg), Intravenous,  Once, 2 of 5 cycles Dose modification:   (original dose 160.6 mg, Cycle 1),   (original dose 160.6 mg, Cycle 1),   (original dose 160.6 mg, Cycle 1),   (original dose 160.6 mg, Cycle 2) Administration: 180 mg (07/28/2016)  for chemotherapy treatment.         Radiation Therapy    Planned to be completed on 09/10/2016.      COPD Hypertensive cardiomyopathy  HISTORY OF PRESENTING ILLNESS:  Carl Murphy 70 y.o. male is  here for ongoing follow-up of muscle invasive carcinoma of the bladder.   Mr. Amparano returns to the [place] today unaccompanied.  He is feeling fine. He has been staying active, though he is "a little slow".   He had one episode of hematuria about 10 days ago, "Just a little". It wasn't clots, just a little mixed in. He continues to experience dysuria intermittently. He is not sure there is an odor to his urine as he only notices if it is very strong. He is not concerned about his urine. He will see urology again a day or so after Thanksgiving and he will perform a scope at this visit. He notes that his urinary symptoms continue to improve since finishing radiation.   He will see radiation oncology again for follow up soon.   He continues to smoke. He has decreased to about 1.5 ppd from 2 ppd.  He has received a flu shot this year. He is eating well. No nausea.  MEDICAL HISTORY:  Past Medical History:  Diagnosis Date  . Arthritis   . Cancer [facility])    Bladder Cancer  . CHF (congestive heart failure) ([place])   . COPD (chronic obstructive pulmonary disease) ([place])   . Depression   . Hard of hearing    bilat   . Hypertension   . Malignant neoplasm of trigone of bladder ([place]) 03/25/2016  . Shortness of breath dyspnea    heat; walking  up set of stairs; pt states can walk up 12-14 stairs slowly without having to stop to catch his breath    SURGICAL HISTORY: Past Surgical History:  Procedure Laterality Date  . CYSTOSCOPY N/A 06/16/2016   Procedure: CYSTOSCOPY;  Surgeon: Franchot Gallo, MD;  Location: [facility];  Service: Urology;  Laterality: N/A;  . TRANSURETHRAL RESECTION OF BLADDER TUMOR N/A 03/25/2016   Procedure: TRANSURETHRAL RESECTION OF BLADDER TUMOR (TURBT);  Surgeon: Franchot Gallo, MD;  Location: [facility];  Service: Urology;  Laterality: N/A;  . TRANSURETHRAL RESECTION OF BLADDER TUMOR N/A 06/16/2016   Procedure: TRANSURETHRAL RESECTION OF BLADDER TUMOR (TURBT);  Surgeon:  Franchot Gallo, MD;  Location: [facility];  Service: Urology;  Laterality: N/A;    SOCIAL HISTORY: Social History   Social History  . Marital status: Married    Spouse name: N/A  . Number of children: N/A  . Years of education: N/A   Occupational History  . Not on file.   Social History Main Topics  . Smoking status: Current Every Day Smoker    Packs/day: 2.00    Years: 50.00    Types: Cigarettes  . Smokeless tobacco: Never Used  . Alcohol use No     Comment: rarestop drinking several years ago; then begin to drink again for approx 3 wks but has currently stopped  . Drug use: No  . Sexual activity: No   Other Topics Concern  . Not on file   Social History Narrative  . 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 Hypertension Mother   . Asthma Father   . Emphysema Father     Both parents deceased Mom was early 73's when she died; he was around 80-something Dad had asthma and emphysema really bad; thinks his heart finally gave out He says his mother's "intestines broke" and it was "fast forward downhill from there."  He is an only child.   ALLERGIES:  has No Known Allergies.  MEDICATIONS:  Current Outpatient Prescriptions  Medication Sig Dispense Refill  . aspirin 325 MG tablet Take 325 mg by mouth daily.    Marland Kitchen buPROPion (WELLBUTRIN XL) 150 MG 24 hr tablet   2  . carvedilol (COREG) 25 MG tablet Take 1 tablet by mouth 2 (two) times daily.  11  . citalopram (CELEXA) 20  MG tablet Take 20 mg by mouth at bedtime.   11  . loratadine (CLARITIN) 10 MG tablet Take 10 mg by mouth daily as needed for allergies.    Marland Kitchen losartan-hydrochlorothiazide (HYZAAR) 100-25 MG tablet Take 1 tablet by mouth at bedtime.   11  . ondansetron (ZOFRAN) 8 MG tablet TAKE 1 TABLET BY MOUTH EVERY 8 HOURS AS NEEDED FOR NAUSEA. 30 tablet 0  . prochlorperazine (COMPAZINE) 10 MG tablet Take 1 tablet (10 mg total) by mouth every 6 (six) hours as needed for nausea or vomiting. 30 tablet 2  . promethazine (PHENERGAN) 25 MG suppository Place 1 suppository (25 mg total)  rectally every 6 (six) hours as needed for nausea or vomiting. 5 each 0   No current facility-administered medications for this visit.     Review of Systems  Constitutional: Negative.  Negative for chills, diaphoresis, fever, malaise/fatigue and weight loss.  HENT: Negative.  Negative for congestion, ear discharge, ear pain, hearing loss, nosebleeds, sore throat and tinnitus.   Eyes: Negative.  Negative for blurred vision, double vision, photophobia, pain, discharge and redness.  Respiratory: Positive for cough and shortness of breath. Negative for hemoptysis, wheezing and stridor.        Says he has a smoker's cough.  Cardiovascular: Negative.  Negative for chest pain, palpitations, orthopnea, claudication, leg swelling and PND.  Gastrointestinal: Negative for abdominal pain, blood in stool, constipation, heartburn, melena, nausea and vomiting.  Genitourinary: Positive for dysuria and hematuria. Negative for flank pain, frequency and urgency.       One episode of hematuria about 10 days ago Intermittent dysuria  Musculoskeletal: Positive for joint pain. Negative for back pain, falls, myalgias and neck pain.  Skin: Negative.  Negative for itching and rash.  Neurological: Negative.  Negative for dizziness, tingling, tremors, sensory change, speech change, focal weakness, seizures, loss of consciousness, weakness and headaches.    Endo/Heme/Allergies: Negative.  Negative for environmental allergies and polydipsia. Does not bruise/bleed easily.  Psychiatric/Behavioral: Negative.  Negative for depression, hallucinations, memory loss, substance abuse and suicidal ideas. The patient is not nervous/anxious and does not have insomnia.   All other systems reviewed and are negative. 14 point ROS was done and is otherwise as detailed above or in HPI    PHYSICAL EXAMINATION: ECOG PERFORMANCE STATUS: 1 - Symptomatic but completely ambulatory  Vitals:   10/06/16 1410  BP: 116/64  Pulse: 74  Resp: 18  Temp: 97.9 F (36.6 C)   Filed Weights   10/06/16 1410  Weight: 181 lb 12.8 oz (82.5 kg)    Physical Exam  Constitutional: He is oriented to person, place, and time. No distress.  HENT:  Head: Normocephalic and atraumatic.  Mouth/Throat: Oropharynx is clear and moist. No oropharyngeal exudate.  Eyes: Conjunctivae and EOM are normal. Pupils are equal, round, and reactive to light. Right eye exhibits no discharge. Left eye exhibits no discharge. No scleral icterus.  Neck: Normal range of motion. Neck supple. No tracheal deviation present. No thyromegaly present.  Cardiovascular: Normal rate, regular rhythm and normal heart sounds.   No murmur heard. Pulmonary/Chest: Effort normal. No respiratory distress. He has wheezes.  Abdominal: Soft. Bowel sounds are normal. He exhibits no distension and no mass. There is no tenderness. There is no rebound and no guarding.  Umbilical hernia present.  Musculoskeletal: Normal range of motion. He exhibits no edema or tenderness.  Lymphadenopathy:    He has no cervical adenopathy.  Neurological: He is alert and oriented to person, place, and time. No cranial nerve deficit. Gait normal. Coordination normal.  Skin: Skin is warm and dry. No rash noted. He is not diaphoretic. No erythema.  Psychiatric: Mood, memory, affect and judgment normal.  Nursing note and vitals  reviewed.   LABORATORY DATA:  I have reviewed the data as listed Lab Results  Component Value Date   WBC 4.7 10/06/2016   HGB 11.3 (L) 10/06/2016   HCT 33.9 (L) 10/06/2016   MCV 96.9 10/06/2016   PLT 271 10/06/2016   CMP     Component Value Date/Time   NA 133 (L) 10/06/2016 1303   K 4.2 10/06/2016 1303   CL  100 (L) 10/06/2016 1303   CO2 26 10/06/2016 1303   GLUCOSE 94 10/06/2016 1303   BUN 24 (H) 10/06/2016 1303   CREATININE 1.56 (H) 10/06/2016 1303   CALCIUM 8.7 (L) 10/06/2016 1303   PROT 7.2 10/06/2016 1303   ALBUMIN 3.6 10/06/2016 1303   AST 22 10/06/2016 1303   ALT 12 (L) 10/06/2016 1303   ALKPHOS 64 10/06/2016 1303   BILITOT 0.3 10/06/2016 1303   GFRNONAA 44 (L) 10/06/2016 1303   GFRAA 51 (L) 10/06/2016 1303     RADIOGRAPHIC STUDIES: I have personally reviewed the radiological images as listed and agreed with the findings in the report. CLINICAL DATA: Hematuria and frequent urination.  EXAM: CT ABDOMEN AND PELVIS WITHOUT AND WITH CONTRAST  TECHNIQUE: Multidetector CT imaging of the abdomen and pelvis was performed following the standard protocol before and following the bolus administration of intravenous contrast.  CONTRAST: 176mL OMNIPAQUE IOHEXOL 300 MG/ML SOLN  COMPARISON: CT scan 03/31/2013  FINDINGS: Lower chest: The lung bases are clear of acute process. No worrisome pulmonary lesions. No pleural effusion. The heart is normal in size. No pericardial effusion. Coronary artery calcifications are noted. Dense distal aortic calcifications are noted. The esophagus is grossly normal.  Hepatobiliary: Small low-attenuation liver lesions are stable and likely small cysts. Lesion and segment 8 is also stable and likely a hemangioma. No worrisome hepatic lesions or intrahepatic biliary dilatation. The gallbladder is normal. No common bile duct dilatation.  Pancreas: No mass, inflammation or ductal dilatation.  Spleen: Normal size. No  focal lesions.  Adrenals/Urinary Tract: The adrenal glands are normal and stable.  Numerous left renal artery calcifications. I do not see any definite calculi in the left collecting system. Small left renal cysts are noted. The no right renal calculi or hydronephrosis. Both ureters are normal. No ureteral calculi. No significant collecting system abnormality on the delayed images.  There is a large irregular bladder mass at on the right side. This demonstrates moderate contrast enhancement. It measures 3.2 x 3.0 cm. No ureteral obstruction.  Stomach/Bowel: The stomach, duodenum, small bowel and colon are unremarkable without oral contrast. No inflammatory changes, mass lesions or obstructive findings. The terminal ileum is normal. The appendix is normal. Moderate sigmoid diverticulosis without findings for acute diverticulitis.  Vascular/Lymphatic: Advanced aortic and branch vessel atherosclerotic calcifications. No focal aneurysm or dissection. The major venous structures are patent.  No mesenteric or retroperitoneal mass or lymphadenopathy. Small scattered lymph nodes are noted. Small bilateral external iliac artery lymph nodes are stable. No pelvic lymphadenopathy. No inguinal adenopathy. The prostate gland and seminal vesicles are grossly normal. New line small  Small Periumbilical abdominal wall hernia containing fat and a small bowel loop. This has enlarged since prior CT scan.  Other: No subcutaneous lesions or ascites.  Musculoskeletal: No significant bony findings.  IMPRESSION: 1. 3.2 x 3.0 cm bladder mass consistent with transitional cell carcinoma. No findings for local spread of disease or metastatic disease. 2. No worrisome renal or ureteral lesions. 3. Small benign-appearing and stable hepatic lesions. 4. Advanced atherosclerotic calcifications involving the aorta and branch vessels. 5. Small periumbilical abdominal wall hernia containing fat and  a small bowel loop.   Electronically Signed  By: Marijo Sanes M.D.  On: 02/18/2016 16:47   ASSESSMENT & PLAN:  Muscle invasive carcinoma of the trigone of the bladder Tobacco Abuse CKD Anemia Hypotension  He has completed recommended therapy. He did remarkably well. He has follow-up with Dr. Eulogio Ditch after Thanksgiving for repeat cystoscopy.  I  will follow-up with him post to discuss results. He has a good understanding that if he has residual disease, surgical options remain.   I addressed the importance of smoking cessation with the patient in detail.  We discussed the health benefits of cessation.  We discussed the health detriments of ongoing tobacco use including but not limited to COPD, heart disease and malignancy. We reviewed the multiple options for cessation and I offered to refer him to smoking cessation classes. We discussed other alternatives to quit such as chantix, wellbutrin. We will continue to address this moving forward. He is very straightforward in regards to his unwillingness to quit.   He does not need any refills at this time.  Labs reviewed with the patient in detail. Results are noted above.   All questions were answered. The patient knows to call the clinic with any problems, questions or concerns.  This document serves as a record of services personally performed by Ancil Linsey, MD. It was created on her behalf by Arlyce Harman, a trained medical scribe. The creation of this record is based on the scribe's personal observations and the provider's statements to them. This document has been checked and approved by the attending provider.  I have reviewed the above documentation for accuracy and completeness and I agree with the above.  This note was electronically signed.    Molli Hazard, MD  10/06/2016 2:26 PM

## 2016-10-06 NOTE — Patient Instructions (Addendum)
[place] at [facility] Discharge Instructions  RECOMMENDATIONS MADE BY THE CONSULTANT AND ANY TEST RESULTS WILL BE SENT TO YOUR REFERRING PHYSICIAN.  You saw Dr.Penland today. Follow up 1st week of December See Amy at checkout for appointments.  Thank you for choosing [place] at [facility] to provide your oncology and hematology care.  To afford each patient quality time with our provider, please arrive at least 15 minutes before your scheduled appointment time.   Beginning January 23rd 2017 lab work for the [facility] will be done in the  Main lab at [facility] on 1st floor. If you have a lab appointment with the [facility] please come in thru the  Main Entrance and check in at the main information desk  You need to re-schedule your appointment should you arrive 10 or more minutes late.  We strive to give you quality time with our providers, and arriving late affects you and other patients whose appointments are after yours.  Also, if you no show three or more times for appointments you may be dismissed from the clinic at the providers discretion.     Again, thank you for choosing [facility]  Our hope is that these requests will decrease the amount of time that you wait before being seen by our physicians.       _____________________________________________________________  Should you have questions after your visit to [facility], please contact our office at [phone] between the hours of 8:30 a.m. and 4:30 p.m.  Voicemails left after 4:30 p.m. will not be returned until the following business day.  For prescription refill requests, have your pharmacy contact our office.         Resources For Cancer Patients and their Caregivers ? American Cancer Society: Can assist with transportation, wigs, general needs, runs Look Good Feel Better.        (815)544-8165 ? Cancer Care: Provides financial  assistance, online support groups, medication/co-pay assistance.  1-800-813-HOPE (336)840-5731) ? [employer] Assists [place] cancer patients and their families through emotional , educational and financial support.  (509) 370-9089 ? [place] DSS Where to apply for food stamps, Medicaid and utility assistance. 406-778-3012 ? [facility]: Transportation to medical appointments. (604) 245-0556 ? Social Security Administration: May apply for disability if have a Stage IV cancer. (970) 335-6762 (407) 528-7633 ? [employer], Disability and Transit Services: Assists with nutrition, care and transit needs. [place] Support Programs: @10RELATIVEDAYS @ > Cancer Support Group  2nd Tuesday of the month 1pm-2pm, Journey Room  > Creative Journey  3rd Tuesday of the month 1130am-1pm, Journey Room  > Look Good Feel Better  1st Wednesday of the month 10am-12 noon, Journey Room (Call [place] to register [phone])

## 2016-10-28 ENCOUNTER — Ambulatory Visit (INDEPENDENT_AMBULATORY_CARE_PROVIDER_SITE_OTHER): Payer: Medicare Other | Admitting: Urology

## 2016-10-28 DIAGNOSIS — C670 Malignant neoplasm of trigone of bladder: Secondary | ICD-10-CM | POA: Diagnosis not present

## 2016-11-05 ENCOUNTER — Encounter (HOSPITAL_COMMUNITY): Payer: Medicare Other | Attending: Hematology & Oncology | Admitting: Hematology & Oncology

## 2016-11-05 ENCOUNTER — Encounter (HOSPITAL_COMMUNITY): Payer: Self-pay | Admitting: Hematology & Oncology

## 2016-11-05 VITALS — HR 71 | Temp 97.9°F | Resp 18 | Wt 186.3 lb

## 2016-11-05 DIAGNOSIS — Z720 Tobacco use: Secondary | ICD-10-CM

## 2016-11-05 DIAGNOSIS — N183 Chronic kidney disease, stage 3 unspecified: Secondary | ICD-10-CM

## 2016-11-05 DIAGNOSIS — C670 Malignant neoplasm of trigone of bladder: Secondary | ICD-10-CM | POA: Diagnosis not present

## 2016-11-05 NOTE — Progress Notes (Signed)
[place]  PROGRESS NOTE  Patient Care Team: [name], MD as PCP - General (Internal Medicine)  CHIEF COMPLAINTS/PURPOSE OF CONSULTATION:   Oncology History   Per available records had bladder tumor removed but had extension into the muscularis propria. CT showed no extension beyond the bladder. Has seen 2 urologists in consultation. Adamant about not having a cystectomy and ileal conduit.     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 No findings for local spread of disease or metastatic disease      03/25/2016 Surgery    TURBT 3cm right trigonal bladder tumor. followed by intravesical epirubicin instillation      03/25/2016 Pathology Results    bladder TURBT infiltrative high grade papillary urothelial carcinoma, invades the mulcularis propria (detrusor) no LVI       04/14/2016 Miscellaneous    [name] consultation in [place] for consideration of cystectomy. patient also offered repeat resection with BCG, and CHEMO/XRT      06/17/2016 Pathology Results    TURB- high grade papillary urothelial carcinoma with focal stromal invasion      07/22/2016 -  Chemotherapy    The patient had palonosetron (ALOXI) injection 0.25 mg, 0.25 mg, Intravenous,  Once, 2 of 5 cycles Administration: 0.25 mg (07/28/2016)  CARBOplatin (PARAPLATIN) 160 mg in sodium chloride 0.9 % 100 mL chemo infusion, 160 mg (100 % of original dose 160.6 mg), Intravenous,  Once, 2 of 5 cycles Dose modification:   (original dose 160.6 mg, Cycle 1),   (original dose 160.6 mg, Cycle 1),   (original dose 160.6 mg, Cycle 1),   (original dose 160.6 mg, Cycle 2) Administration: 180 mg (07/28/2016)  for chemotherapy treatment.         Radiation Therapy    Planned to be completed on 09/10/2016.      COPD Hypertensive cardiomyopathy  HISTORY OF PRESENTING ILLNESS:  Carl Murphy 70 y.o. male is  here for ongoing follow-up of muscle invasive carcinoma of the bladder. He completed concurrent carbo/XRT.   Carl Murphy returns to the [place] today accompanied by his daughter.   He has had a repeat cystoscopy and notes that it is highly suspicious he has residual disease.  He is scheduled for a biopsy around January 9.  Patient wants to wait until the New Year to do this. His daughter wants him to get it done as soon as possible.   He wanted to know if he could get more radiation and chemotherapy. He wants to know if he would need a port.   He is not sure if he is healthy enough to have a Cystectomy because he is a heavy smoker. He notes that it is not that he is opposed to a cystectomy he is simply concerned about his lungs being able to stand a major surgery.   He has been feeling good. He denies any pain. No hematuria. He continues to enjoy his family.   MEDICAL HISTORY:  Past Medical History:  Diagnosis Date  . Arthritis   . Cancer [facility])    Bladder Cancer  . CHF (congestive heart failure) ([place])   . COPD (chronic obstructive pulmonary disease) ([place])   . Depression   . Hard of hearing    bilat   . Hypertension   . Malignant neoplasm of trigone of bladder ([place]) 03/25/2016  . Shortness of breath dyspnea  heat; walking up set of stairs; pt states can walk up 12-14 stairs slowly without having to stop to catch his breath    SURGICAL HISTORY: Past Surgical History:  Procedure Laterality Date  . CYSTOSCOPY N/A 06/16/2016   Procedure: CYSTOSCOPY;  Surgeon: Franchot Gallo, MD;  Location: [facility];  Service: Urology;  Laterality: N/A;  . TRANSURETHRAL RESECTION OF BLADDER TUMOR N/A 03/25/2016   Procedure: TRANSURETHRAL RESECTION OF BLADDER TUMOR (TURBT);  Surgeon: Franchot Gallo, MD;  Location: [facility];  Service: Urology;  Laterality: N/A;  . TRANSURETHRAL RESECTION OF BLADDER TUMOR N/A 06/16/2016   Procedure: TRANSURETHRAL RESECTION OF BLADDER TUMOR (TURBT);  Surgeon:  Franchot Gallo, MD;  Location: [facility];  Service: Urology;  Laterality: N/A;    SOCIAL HISTORY: Social History   Social History  . Marital status: Married    Spouse name: N/A  . Number of children: N/A  . Years of education: N/A   Occupational History  . Not on file.   Social History Main Topics  . Smoking status: Current Every Day Smoker    Packs/day: 2.00    Years: 50.00    Types: Cigarettes  . Smokeless tobacco: Never Used  . Alcohol use No     Comment: rarestop drinking several years ago; then begin to drink again for approx 3 wks but has currently stopped  . Drug use: No  . Sexual activity: No   Other Topics Concern  . Not on file   Social History Narrative  . 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 Hypertension Mother   . Asthma Father   . Emphysema Father     Both parents deceased Mom was early 48's when she died; he was around 80-something Dad had asthma and emphysema really bad; thinks his heart finally gave out He says his mother's "intestines broke" and it was "fast forward downhill from there."  He is an only child.   ALLERGIES:  has No Known Allergies.  MEDICATIONS:  Current Outpatient Prescriptions  Medication Sig Dispense Refill  . aspirin 325 MG tablet Take 325 mg by mouth daily.    Marland Kitchen buPROPion (WELLBUTRIN XL) 150 MG 24 hr tablet   2  . carvedilol (COREG) 25 MG tablet Take 1 tablet by mouth 2 (two) times daily.  11  . citalopram (CELEXA) 20  MG tablet Take 20 mg by mouth at bedtime.   11  . loratadine (CLARITIN) 10 MG tablet Take 10 mg by mouth daily as needed for allergies.    Marland Kitchen losartan-hydrochlorothiazide (HYZAAR) 100-25 MG tablet Take 1 tablet by mouth at bedtime.   11  . ondansetron (ZOFRAN) 8 MG tablet TAKE 1 TABLET BY MOUTH EVERY 8 HOURS AS NEEDED FOR NAUSEA. 30 tablet 0  . prochlorperazine (COMPAZINE) 10 MG tablet Take 1 tablet (10 mg total) by mouth every 6 (six) hours as needed for nausea or vomiting. 30 tablet 2  . promethazine (PHENERGAN) 25 MG suppository Place 1 suppository (25  mg total) rectally every 6 (six) hours as needed for nausea or vomiting. 5 each 0   No current facility-administered medications for this visit.     Review of Systems  Constitutional: Negative.   HENT: Negative.   Eyes: Negative.   Respiratory: Negative.   Cardiovascular: Negative.   Gastrointestinal: Negative.   Genitourinary: Negative.   Musculoskeletal: Negative.   Skin: Negative.   Neurological: Negative.   Endo/Heme/Allergies: Negative.   Psychiatric/Behavioral: Negative.   All other systems reviewed and are negative. 14 point ROS was done and is otherwise as detailed above or in HPI    PHYSICAL EXAMINATION: ECOG PERFORMANCE STATUS: 1 - Symptomatic but completely ambulatory  Vitals:   11/05/16 0926  Pulse: 71  Resp: 18  Temp: 97.9 F (36.6 C)   Filed Weights   11/05/16 0926  Weight: 186 lb 4.8 oz (84.5 kg)     Physical Exam  Constitutional: He is oriented to person, place, and time and well-developed, well-nourished, and in no distress. No distress.  HENT:  Head: Normocephalic and atraumatic.  Mouth/Throat: Oropharynx is clear and moist. No oropharyngeal exudate.  Eyes: Conjunctivae and EOM are normal. Pupils are equal, round, and reactive to light. Right eye exhibits no discharge. Left eye exhibits no discharge. No scleral icterus.  Neck: Normal range of motion. Neck supple. No tracheal deviation present. No  thyromegaly present.  Cardiovascular: Normal rate, regular rhythm and normal heart sounds.   No murmur heard. Pulmonary/Chest: Effort normal and breath sounds normal. No respiratory distress. He has no wheezes.  Abdominal: Soft. Bowel sounds are normal. He exhibits no distension and no mass. There is no tenderness. There is no rebound and no guarding.  Musculoskeletal: Normal range of motion. He exhibits no edema or tenderness.  Lymphadenopathy:    He has no cervical adenopathy.  Neurological: He is alert and oriented to person, place, and time. No cranial nerve deficit. Gait normal. Coordination normal.  Skin: Skin is warm and dry. No rash noted. He is not diaphoretic. No erythema.  Psychiatric: Mood, memory, affect and judgment normal.  Nursing note and vitals reviewed.   LABORATORY DATA:  I have reviewed the data as listed Lab Results  Component Value Date   WBC 4.7 10/06/2016   HGB 11.3 (L) 10/06/2016   HCT 33.9 (L) 10/06/2016   MCV 96.9 10/06/2016   PLT 271 10/06/2016   CMP     Component Value Date/Time   NA 133 (L) 10/06/2016 1303   K 4.2 10/06/2016 1303   CL 100 (L) 10/06/2016 1303   CO2 26 10/06/2016 1303   GLUCOSE 94 10/06/2016 1303   BUN 24 (H) 10/06/2016 1303   CREATININE 1.56 (H) 10/06/2016 1303   CALCIUM 8.7 (L) 10/06/2016 1303   PROT 7.2 10/06/2016 1303   ALBUMIN 3.6 10/06/2016 1303   AST 22 10/06/2016 1303   ALT 12 (L) 10/06/2016 1303   ALKPHOS 64 10/06/2016 1303   BILITOT 0.3 10/06/2016 1303   GFRNONAA 44 (L) 10/06/2016 1303   GFRAA 51 (L) 10/06/2016 1303     RADIOGRAPHIC STUDIES: I have personally reviewed the radiological images as listed and agreed with the findings in the report. CLINICAL DATA: Hematuria and frequent urination.  EXAM: CT ABDOMEN AND PELVIS WITHOUT AND WITH CONTRAST  TECHNIQUE: Multidetector CT imaging of the abdomen and pelvis was performed following the standard protocol before and following the bolus administration of  intravenous contrast.  CONTRAST: 1102mL OMNIPAQUE IOHEXOL 300 MG/ML SOLN  COMPARISON: CT scan 03/31/2013  FINDINGS: Lower chest: The lung bases are clear of acute process. No worrisome pulmonary lesions. No pleural effusion. The heart is normal in size. No pericardial effusion. Coronary artery calcifications are noted. Dense distal aortic calcifications are noted. The esophagus is grossly normal.  Hepatobiliary: Small low-attenuation liver lesions are stable and likely small cysts. Lesion and segment 8 is also stable and likely a hemangioma. No worrisome hepatic lesions or intrahepatic biliary dilatation. The gallbladder is normal. No common bile duct dilatation.  Pancreas: No mass, inflammation or ductal dilatation.  Spleen: Normal size. No focal lesions.  Adrenals/Urinary Tract: The adrenal glands are normal and stable.  Numerous left renal artery calcifications. I do not see any definite calculi in the left collecting system. Small left renal cysts are noted. The no right renal calculi or hydronephrosis. Both ureters are normal. No ureteral calculi. No significant collecting system abnormality on the delayed images.  There is a large irregular bladder mass at on the right side. This demonstrates moderate contrast enhancement. It measures 3.2 x 3.0 cm. No ureteral obstruction.  Stomach/Bowel: The stomach, duodenum, small bowel and colon are unremarkable without oral contrast. No inflammatory changes, mass lesions or obstructive findings. The terminal ileum is normal. The appendix is normal. Moderate sigmoid diverticulosis without findings for acute diverticulitis.  Vascular/Lymphatic: Advanced aortic and branch vessel atherosclerotic calcifications. No focal aneurysm or dissection. The major venous structures are patent.  No mesenteric or retroperitoneal mass or lymphadenopathy. Small scattered lymph nodes are noted. Small bilateral external  iliac artery lymph nodes are stable. No pelvic lymphadenopathy. No inguinal adenopathy. The prostate gland and seminal vesicles are grossly normal. New line small  Small Periumbilical abdominal wall hernia containing fat and a small bowel loop. This has enlarged since prior CT scan.  Other: No subcutaneous lesions or ascites.  Musculoskeletal: No significant bony findings.  IMPRESSION: 1. 3.2 x 3.0 cm bladder mass consistent with transitional cell carcinoma. No findings for local spread of disease or metastatic disease. 2. No worrisome renal or ureteral lesions. 3. Small benign-appearing and stable hepatic lesions. 4. Advanced atherosclerotic calcifications involving the aorta and branch vessels. 5. Small periumbilical abdominal wall hernia containing fat and a small bowel loop.   Electronically Signed  By: Marijo Sanes M.D.  On: 02/18/2016 16:47   ASSESSMENT & PLAN:  Muscle invasive carcinoma of the trigone of the bladder Tobacco Abuse CKD Anemia Hypotension  He has completed recommended therapy. He did remarkably well. He has had a repeat cystoscopy and apparently has residual disease, he has a formal biopsy after the new year. Carl Murphy is not opposed to a cystectomy he has a fear that his lungs will not be able to stand major surgery.  I told him that I believe speaking to his pulmonologist and getting a Cystectomy could be his best treatment option. He is still very active.  I explained that he can not get any more radiation.   I will have Anderson Malta call him and find out what day he is getting his biopsy.   We will follow up with him about a week after his biopsy in January.   I have reviewed the NCCN guidelines with him.   NCCN guidelines recommends the follow surveillance for Stage I, II for those who asertain a complete response in the first-line treatment setting (3.2017):  A. H&P every 3-6 months for 5 years, then yearly or as clinically indicated.  B.  Labs every 3-6 months for 5 years and then annually or as clinically indicated.  C. Imaging only as indicated. For those ascertaining a complete response in the Stage III, IV setting in first-line treatment setting:  A. H&P every 3-6 months for 5 years, then yearly or as clinically indicated.  B. Labs every 3-6 months for 5 years and then annually or as clinically indicated.  C. CT C/A/P with contrast no more than every 6 months for 2 years after  completion of treatment; then only as indicated.  All questions were answered. The patient knows to call the clinic with any problems, questions or concerns.  This document serves as a record of services personally performed by Ancil Linsey, MD. It was created on her behalf by Martinique Casey, a trained medical scribe. The creation of this record is based on the scribe's personal observations and the provider's statements to them. This document has been checked and approved by the attending provider.  I have reviewed the above documentation for accuracy and completeness and I agree with the above.  This note was electronically signed.    Molli Hazard, MD  11/05/2016 9:36 AM

## 2016-11-05 NOTE — Patient Instructions (Addendum)
[place] at [facility] Discharge Instructions  RECOMMENDATIONS MADE BY THE CONSULTANT AND ANY TEST RESULTS WILL BE SENT TO YOUR REFERRING PHYSICIAN.  You saw Dr.Penland today. Anderson Malta will contact you. See Amy at checkout for appointments.  Thank you for choosing [facility] to provide your oncology and hematology care.  To afford each patient quality time with our provider, please arrive at least 15 minutes before your scheduled appointment time.   Beginning January 23rd 2017 lab work for the [facility] will be done in the  Main lab at [facility] on 1st floor. If you have a lab appointment with the [facility] please come in thru the  Main Entrance and check in at the main information desk  You need to re-schedule your appointment should you arrive 10 or more minutes late.  We strive to give you quality time with our providers, and arriving late affects you and other patients whose appointments are after yours.  Also, if you no show three or more times for appointments you may be dismissed from the clinic at the providers discretion.     Again, thank you for choosing [facility].  Our hope is that these requests will decrease the amount of time that you wait before being seen by our physicians.       _____________________________________________________________  Should you have questions after your visit to [facility], please contact our office at [phone] between the hours of 8:30 a.m. and 4:30 p.m.  Voicemails left after 4:30 p.m. will not be returned until the following business day.  For prescription refill requests, have your pharmacy contact our office.         Resources For Cancer Patients and their Caregivers ? American Cancer Society: Can assist with transportation, wigs, general needs, runs Look Good Feel Better.        (858)655-0064 ? Cancer Care: Provides financial  assistance, online support groups, medication/co-pay assistance.  1-800-813-HOPE 406-169-5328) ? [place] Assists [place] cancer patients and their families through emotional , educational and financial support.  321 498 7966 ? [place] DSS Where to apply for food stamps, Medicaid and utility assistance. 647-029-9463 ? [facility]: Transportation to medical appointments. 306-510-3842 ? Social Security Administration: May apply for disability if have a Stage IV cancer. 713-473-5946 (863) 208-4616 ? [employer], Disability and Transit Services: Assists with nutrition, care and transit needs. [place] Support Programs: @10RELATIVEDAYS @ > Cancer Support Group  2nd Tuesday of the month 1pm-2pm, Journey Room  > Creative Journey  3rd Tuesday of the month 1130am-1pm, Journey Room  > Look Good Feel Better  1st Wednesday of the month 10am-12 noon, Journey Room (Call [place] to register [phone])

## 2016-11-05 NOTE — Progress Notes (Signed)
Went in to see pt and give him my card so he could update me about what is going on and if he will have surgery.

## 2016-12-09 ENCOUNTER — Other Ambulatory Visit: Payer: Self-pay | Admitting: Radiology

## 2016-12-09 ENCOUNTER — Telehealth: Payer: Self-pay | Admitting: Radiology

## 2016-12-09 DIAGNOSIS — C670 Malignant neoplasm of trigone of bladder: Secondary | ICD-10-CM

## 2016-12-09 NOTE — Telephone Encounter (Signed)
LMOM. Need to notify pt of surgery information. 

## 2016-12-09 NOTE — Telephone Encounter (Signed)
Notified pt of surgery scheduled at [facility] with Dr Diona Fanti on 12/16/16, pre-op at [facility] on 12/11/16 @1 :15 & post op appt with Dr Diona Fanti at [place] office on 12/23/16 @10 :8. Advised pt to hold ASA 325mg  beginning on 12/11/16 per Dr Diona Fanti. Pt voices understanding.

## 2016-12-09 NOTE — Patient Instructions (Signed)
Carl Murphy  12/09/2016     @PREFPERIOPPHARMACY @   Your procedure is scheduled on  12/16/2016   Report to [facility] at  615  A.M.  Call this number if you have problems the morning of surgery:  [phone]   Remember:  Do not eat food or drink liquids after midnight.  Take these medicines the morning of surgery with A SIP OF WATER  Wellbutrin, coreg, clexa, claritin, zofran, compazine or phenergan.   Do not wear jewelry, make-up or nail polish.  Do not wear lotions, powders, or perfumes, or deoderant.  Do not shave 48 hours prior to surgery.  Men may shave face and neck.  Do not bring valuables to the hospital.  [facility] is not responsible for any belongings or valuables.  Contacts, dentures or bridgework may not be worn into surgery.  Leave your suitcase in the car.  After surgery it may be brought to your room.  For patients admitted to the hospital, discharge time will be determined by your treatment team.  Patients discharged the day of surgery will not be allowed to drive home.   Name and phone number of your driver:   family Special instructions:  none  Please read over the following fact sheets that you were given. Anesthesia Post-op Instructions and Care and Recovery After Surgery       Transurethral Resection of Bladder Tumor Transurethral resection of a bladder tumor is the removal (resection) of a cancerous growth (tumor) on the inside wall of the bladder. The bladder is the organ that holds urine. The tumor is removed through the tube that carries urine out of the body (urethra). In a transurethral resection, a thin telescope with a light, a tiny camera, and an electric cutting edge (resectoscope) is passed through the urethra. In men, the opening of the urethra is at the end of the penis. In women, it is just above the vaginal opening. Tell a health care provider about:  Any allergies you have.  All medicines you are taking, including  vitamins, herbs, eye drops, creams, and over-the-counter medicines.  Any problems you or family members have had with anesthetic medicines.  Any blood disorders you have.  Any surgeries you have had.  Any medical conditions you have.  Any recent urinary tract infections you have had.  Whether you are pregnant or may be pregnant. What are the risks? Generally, this is a safe procedure. However, problems may occur, including:  Infection.  Bleeding.  Allergic reactions to medicines.  Damage to other structures or organs, such as:  The urethra.  The tubes that drain urine from the kidneys into the bladder (ureters).  Pain and burning during urination.  Difficulty urinating due to partial blockage of the urethra.  Inability to urinate (urinary retention). What happens before the procedure?  Follow instructions from your health care provider about eating and drinking restrictions.  Ask your health care provider about:  Changing or stopping your regular medicines. This is especially important if you are taking diabetes medicines or blood thinners.  Taking medicines such as aspirin and ibuprofen. These medicines can thin your blood. Do not take these medicines before your procedure if your health care provider instructs you not to.  You may have a physical exam.  You may have tests, including:  Blood tests.  Urine tests.  Electrocardiogram (ECG). This test measures the electrical activity of the heart.  You may be given antibiotic  medicine to help prevent infection.  Ask your health care provider how your surgical site will be marked or identified.  Plan to have someone take you home after the procedure. What happens during the procedure?  To reduce your risk of infection:  Your health care team will wash or sanitize their hands.  Your skin will be washed with soap.  An IV tube will be inserted into one of your veins.  You will be given one or more of the  following:  A medicine to help you relax (sedative).  A medicine to make you fall asleep (general anesthetic).  A medicine that is injected into your spine to numb the area below and slightly above the injection site (spinal anesthetic).  Your legs will be placed in foot rests (stirrups) so that your legs are apart and your knees are bent.  The resectoscope will be passed through your urethra and into your bladder.  The part of your bladder that is affected by the tumor will be resected using the cutting edge of the resectoscope.  The resectoscope will be removed.  A thin, flexible tube (catheter) will be passed through your urethra and into your bladder. The catheter will drain urine into a bag outside of your body.  Fluid may be passed through the catheter to keep the catheter open. The procedure may vary among health care providers and hospitals. What happens after the procedure?  Your blood pressure, heart rate, breathing rate, and blood oxygen level will be monitored often until the medicines you were given have worn off.  You may continue to receive fluids and medicines through an IV tube.  You will have some pain. Pain medicine will be available to help you.  You will have a catheter draining your urine.  You will have blood in your urine. Your catheter may be kept in until your urine is clear.  Your urinary drainage will be monitored. If necessary, your bladder may be rinsed out (irrigated) by passing fluid through your catheter.  You will be encouraged to walk around as soon as possible.  You may have to wear compression stockings. These stockings help to prevent blood clots and reduce swelling in your legs.  Do not drive for 24 hours if you received a sedative. This information is not intended to replace advice given to you by your health care provider. Make sure you discuss any questions you have with your health care provider. Document Released: 09/13/2009 Document  Revised: 07/20/2016 Document Reviewed: 08/09/2015 Elsevier Interactive Patient Education  2017 [employer]  Transurethral Resection of Bladder Tumor, Care After Refer to this sheet in the next few weeks. These instructions provide you with information about caring for yourself after your procedure. Your health care provider may also give you more specific instructions. Your treatment has been planned according to current medical practices, but problems sometimes occur. Call your health care provider if you have any problems or questions after your procedure. What can I expect after the procedure? After the procedure, it is common to have:  A small amount of blood in your urine for up to 2 weeks.  Soreness or mild discomfort from your catheter. After your catheter is removed, you may have mild soreness, especially when urinating.  Pain in your lower abdomen. Follow these instructions at home:   Medicines  Take over-the-counter and prescription medicines only as told by your health care provider.  Do not drive or operate heavy machinery while taking prescription pain medicine.  Do not drive for 24 hours if you received a sedative.  If you were prescribed antibiotic medicine, take it as told by your health care provider. Do not stop taking the antibiotic even if you start to feel better. Activity  Return to your normal activities as told by your health care provider. Ask your health care provider what activities are safe for you.  Do not lift anything that is heavier than 10 lb (4.5 kg) for as long as told by your health care provider.  Avoid intense physical activity for as long as told by your health care provider.  Walk at least one time every day. This helps to prevent blood clots. You may increase your physical activity gradually as you start to feel better. General instructions  Do not drink alcohol for as long as told by your health care provider. This is especially  important if you are taking prescription pain medicines.  Do not take baths, swim, or use a hot tub until your health care provider approves.  If you have a catheter, follow instructions from your health care provider about caring for your catheter and your drainage bag.  Drink enough fluid to keep your urine clear or pale yellow.  Wear compression stockings as told by your health care provider. These stockings help to prevent blood clots and reduce swelling in your legs.  Keep all follow-up visits as told by your health care provider. This is important. Contact a health care provider if:  You have pain that gets worse or does not improve with medicine.  You have blood in your urine for more than 2 weeks.  You have cloudy or bad-smelling urine.  You become constipated. Signs of constipation may include having:  Fewer than three bowel movements in a week.  Difficulty having a bowel movement.  Stools that are dry, hard, or larger than normal.  You have a fever. Get help right away if:  You have:  Severe pain.  Bright-red blood in your urine.  Blood clots in your urine.  A lot of blood in your urine.  Your catheter has been removed and you are not able to urinate.  You have a catheter in place and the catheter is not draining urine. This information is not intended to replace advice given to you by your health care provider. Make sure you discuss any questions you have with your health care provider. Document Released: 10/29/2015 Document Revised: 07/20/2016 Document Reviewed: 08/09/2015 Elsevier Interactive Patient Education  2017 [employer] PATIENT INSTRUCTIONS POST-ANESTHESIA  IMMEDIATELY FOLLOWING SURGERY:  Do not drive or operate machinery for the first twenty four hours after surgery.  Do not make any important decisions for twenty four hours after surgery or while taking narcotic pain medications or sedatives.  If you develop intractable nausea and vomiting or  a severe headache please notify your doctor immediately.  FOLLOW-UP:  Please make an appointment with your surgeon as instructed. You do not need to follow up with anesthesia unless specifically instructed to do so.  WOUND CARE INSTRUCTIONS (if applicable):  Keep a dry clean dressing on the anesthesia/puncture wound site if there is drainage.  Once the wound has quit draining you may leave it open to air.  Generally you should leave the bandage intact for twenty four hours unless there is drainage.  If the epidural site drains for more than 36-48 hours please call the anesthesia department.  QUESTIONS?:  Please feel free to call your physician or the hospital operator  if you have any questions, and they will be happy to assist you.

## 2016-12-11 ENCOUNTER — Encounter (HOSPITAL_COMMUNITY)
Admission: RE | Admit: 2016-12-11 | Discharge: 2016-12-11 | Disposition: A | Discharge status: Home / Self Care | Payer: Medicare Other | Source: Ambulatory Visit | Attending: Urology | Admitting: Urology

## 2016-12-11 ENCOUNTER — Encounter (HOSPITAL_COMMUNITY): Payer: Self-pay

## 2016-12-11 DIAGNOSIS — Z01812 Encounter for preprocedural laboratory examination: Secondary | ICD-10-CM | POA: Diagnosis not present

## 2016-12-11 HISTORY — DX: Nausea with vomiting, unspecified: Z98.890

## 2016-12-11 HISTORY — DX: Nausea with vomiting, unspecified: R11.2

## 2016-12-11 LAB — BASIC METABOLIC PANEL
ANION GAP: 8 (ref 5–15)
BUN: 32 mg/dL — ABNORMAL HIGH (ref 6–20)
CO2: 26 mmol/L (ref 22–32)
CREATININE: 1.19 mg/dL (ref 0.61–1.24)
Calcium: 8.7 mg/dL — ABNORMAL LOW (ref 8.9–10.3)
Chloride: 101 mmol/L (ref 101–111)
GFR calc Af Amer: >60 mL/min (ref >60)
GFR calc non Af Amer: >60 mL/min (ref >60)
GLUCOSE: 107 mg/dL — AB (ref 65–99)
Potassium: 3.9 mmol/L (ref 3.5–5.1)
Sodium: 135 mmol/L (ref 135–145)

## 2016-12-11 LAB — CBC WITH DIFFERENTIAL/PLATELET
Basophils Absolute: 0 10*3/uL (ref 0.0–0.1)
Basophils Relative: 0 %
EOS PCT: 2 %
Eosinophils Absolute: 0.1 10*3/uL (ref 0.0–0.7)
HEMATOCRIT: 35.7 % — AB (ref 39.0–52.0)
Hemoglobin: 12.3 g/dL — ABNORMAL LOW (ref 13.0–17.0)
LYMPHS ABS: 0.7 10*3/uL (ref 0.7–4.0)
LYMPHS PCT: 11 %
MCH: 34.2 pg — ABNORMAL HIGH (ref 26.0–34.0)
MCHC: 34.5 g/dL (ref 30.0–36.0)
MCV: 99.2 fL (ref 78.0–100.0)
MONO ABS: 0.6 10*3/uL (ref 0.1–1.0)
MONOS PCT: 9 %
NEUTROS ABS: 5.2 10*3/uL (ref 1.7–7.7)
Neutrophils Relative %: 78 %
PLATELETS: 229 10*3/uL (ref 150–400)
RBC: 3.6 MIL/uL — ABNORMAL LOW (ref 4.22–5.81)
RDW: 12.8 % (ref 11.5–15.5)
WBC: 6.6 10*3/uL (ref 4.0–10.5)

## 2016-12-16 ENCOUNTER — Encounter (HOSPITAL_COMMUNITY)
Admission: RE | Disposition: A | Discharge status: Home / Self Care | Payer: Self-pay | Source: Ambulatory Visit | Attending: Urology

## 2016-12-16 ENCOUNTER — Encounter (HOSPITAL_COMMUNITY): Payer: Self-pay

## 2016-12-16 ENCOUNTER — Ambulatory Visit (HOSPITAL_COMMUNITY): Payer: Medicare Other | Admitting: Anesthesiology

## 2016-12-16 ENCOUNTER — Ambulatory Visit (HOSPITAL_COMMUNITY)
Admission: RE | Admit: 2016-12-16 | Discharge: 2016-12-16 | Disposition: A | Discharge status: Home / Self Care | Payer: Medicare Other | Source: Ambulatory Visit | Attending: Urology | Admitting: Urology

## 2016-12-16 DIAGNOSIS — I509 Heart failure, unspecified: Secondary | ICD-10-CM | POA: Insufficient documentation

## 2016-12-16 DIAGNOSIS — C672 Malignant neoplasm of lateral wall of bladder: Secondary | ICD-10-CM | POA: Diagnosis present

## 2016-12-16 DIAGNOSIS — M1990 Unspecified osteoarthritis, unspecified site: Secondary | ICD-10-CM | POA: Diagnosis not present

## 2016-12-16 DIAGNOSIS — C670 Malignant neoplasm of trigone of bladder: Secondary | ICD-10-CM

## 2016-12-16 DIAGNOSIS — F329 Major depressive disorder, single episode, unspecified: Secondary | ICD-10-CM | POA: Diagnosis not present

## 2016-12-16 DIAGNOSIS — Z7982 Long term (current) use of aspirin: Secondary | ICD-10-CM | POA: Insufficient documentation

## 2016-12-16 DIAGNOSIS — Z923 Personal history of irradiation: Secondary | ICD-10-CM | POA: Insufficient documentation

## 2016-12-16 DIAGNOSIS — J449 Chronic obstructive pulmonary disease, unspecified: Secondary | ICD-10-CM | POA: Insufficient documentation

## 2016-12-16 DIAGNOSIS — F17210 Nicotine dependence, cigarettes, uncomplicated: Secondary | ICD-10-CM | POA: Diagnosis not present

## 2016-12-16 DIAGNOSIS — D494 Neoplasm of unspecified behavior of bladder: Secondary | ICD-10-CM | POA: Diagnosis not present

## 2016-12-16 DIAGNOSIS — I110 Hypertensive heart disease with heart failure: Secondary | ICD-10-CM | POA: Insufficient documentation

## 2016-12-16 DIAGNOSIS — Z9221 Personal history of antineoplastic chemotherapy: Secondary | ICD-10-CM | POA: Diagnosis not present

## 2016-12-16 HISTORY — PX: TRANSURETHRAL RESECTION OF BLADDER TUMOR: SHX2575

## 2016-12-16 SURGERY — TURBT (TRANSURETHRAL RESECTION OF BLADDER TUMOR)
Anesthesia: Spinal | Site: Bladder

## 2016-12-16 MED ORDER — MIDAZOLAM HCL 2 MG/2ML IJ SOLN
0.5000 mg | INTRAMUSCULAR | Status: DC | PRN
  Administered 2016-12-16: 2 mg via INTRAVENOUS

## 2016-12-16 MED ORDER — MIDAZOLAM HCL 5 MG/5ML IJ SOLN
INTRAMUSCULAR | Status: DC | PRN
  Administered 2016-12-16 (×2): 1 mg via INTRAVENOUS

## 2016-12-16 MED ORDER — FENTANYL CITRATE (PF) 100 MCG/2ML IJ SOLN
25.0000 ug | INTRAMUSCULAR | Status: AC | PRN
  Administered 2016-12-16 (×2): 25 ug via INTRAVENOUS

## 2016-12-16 MED ORDER — EPHEDRINE SULFATE 50 MG/ML IJ SOLN
INTRAMUSCULAR | Status: DC | PRN
  Administered 2016-12-16: 5 mg via INTRAVENOUS
  Administered 2016-12-16: 10 mg via INTRAVENOUS
  Administered 2016-12-16: 5 mg via INTRAVENOUS

## 2016-12-16 MED ORDER — GLYCOPYRROLATE 0.2 MG/ML IJ SOLN
0.2000 mg | Freq: Once | INTRAMUSCULAR | Status: AC | PRN
  Administered 2016-12-16: 0.2 mg via INTRAVENOUS

## 2016-12-16 MED ORDER — MIDAZOLAM HCL 2 MG/2ML IJ SOLN
INTRAMUSCULAR | Status: AC
  Filled 2016-12-16: qty 2

## 2016-12-16 MED ORDER — FENTANYL CITRATE (PF) 100 MCG/2ML IJ SOLN
INTRAMUSCULAR | Status: AC
  Filled 2016-12-16: qty 2

## 2016-12-16 MED ORDER — PROPOFOL 10 MG/ML IV BOLUS
INTRAVENOUS | Status: AC
  Filled 2016-12-16: qty 20

## 2016-12-16 MED ORDER — SUCCINYLCHOLINE CHLORIDE 20 MG/ML IJ SOLN
INTRAMUSCULAR | Status: AC
  Filled 2016-12-16: qty 1

## 2016-12-16 MED ORDER — EPHEDRINE SULFATE 50 MG/ML IJ SOLN
INTRAMUSCULAR | Status: AC
  Filled 2016-12-16: qty 1

## 2016-12-16 MED ORDER — LACTATED RINGERS IV SOLN
INTRAVENOUS | Status: DC
  Administered 2016-12-16: 1000 mL via INTRAVENOUS

## 2016-12-16 MED ORDER — SODIUM CHLORIDE 0.9 % IR SOLN
Status: DC | PRN
  Administered 2016-12-16 (×2): 3000 mL

## 2016-12-16 MED ORDER — FENTANYL CITRATE (PF) 100 MCG/2ML IJ SOLN: 25.0000 ug | INTRAMUSCULAR | Status: DC | PRN

## 2016-12-16 MED ORDER — PHENYLEPHRINE 40 MCG/ML (10ML) SYRINGE FOR IV PUSH (FOR BLOOD PRESSURE SUPPORT)
PREFILLED_SYRINGE | INTRAVENOUS | Status: AC
  Filled 2016-12-16: qty 10

## 2016-12-16 MED ORDER — GLYCOPYRROLATE 0.2 MG/ML IJ SOLN
INTRAMUSCULAR | Status: AC
  Filled 2016-12-16: qty 1

## 2016-12-16 MED ORDER — ONDANSETRON HCL 4 MG PO TABS: 4.0000 mg | ORAL_TABLET | Freq: Three times a day (TID) | ORAL | 0 refills | Status: DC | PRN

## 2016-12-16 MED ORDER — BUPIVACAINE HCL (PF) 0.75 % IJ SOLN
INTRAMUSCULAR | Status: DC | PRN
Start: 2016-12-16 — End: 2016-12-16
  Administered 2016-12-16: 7.5 mg via INTRATHECAL

## 2016-12-16 MED ORDER — PROPOFOL 500 MG/50ML IV EMUL
INTRAVENOUS | Status: DC | PRN
  Administered 2016-12-16: 25 ug/kg/min via INTRAVENOUS

## 2016-12-16 MED ORDER — PHENYLEPHRINE HCL 10 MG/ML IJ SOLN
INTRAMUSCULAR | Status: DC | PRN
  Administered 2016-12-16 (×2): 40 ug via INTRAVENOUS
  Administered 2016-12-16: 80 ug via INTRAVENOUS

## 2016-12-16 MED ORDER — PROPOFOL 10 MG/ML IV BOLUS
INTRAVENOUS | Status: AC
  Filled 2016-12-16: qty 40

## 2016-12-16 MED ORDER — CEPHALEXIN 500 MG PO CAPS: 500.0000 mg | ORAL_CAPSULE | Freq: Two times a day (BID) | ORAL | 0 refills | Status: DC

## 2016-12-16 MED ORDER — CEFAZOLIN IN D5W 1 GM/50ML IV SOLN
INTRAVENOUS | Status: AC
  Filled 2016-12-16: qty 50

## 2016-12-16 MED ORDER — SODIUM CHLORIDE 0.9 % IJ SOLN
INTRAMUSCULAR | Status: AC
  Filled 2016-12-16: qty 10

## 2016-12-16 MED ORDER — CEFAZOLIN SODIUM-DEXTROSE 2-4 GM/100ML-% IV SOLN
2.0000 g | INTRAVENOUS | Status: AC
  Administered 2016-12-16: 2 g via INTRAVENOUS

## 2016-12-16 MED ORDER — WATER FOR IRRIGATION, STERILE IR SOLN
Status: DC | PRN
  Administered 2016-12-16: 1000 mL

## 2016-12-16 SURGICAL SUPPLY — 22 items
BAG DRAIN URO TABLE W/ADPT NS (DRAPE) ×2 IMPLANT
BAG HAMPER (MISCELLANEOUS) ×2 IMPLANT
BAG URINE LEG 500ML (DRAIN) ×2 IMPLANT
CATH FOLEY 2WAY SLVR  5CC 20FR (CATHETERS) ×1
CATH FOLEY 2WAY SLVR 5CC 20FR (CATHETERS) ×1 IMPLANT
CLOTH BEACON ORANGE TIMEOUT ST (SAFETY) ×2 IMPLANT
ELECT REM PT RETURN 9FT ADLT (ELECTROSURGICAL) ×2
ELECTRODE REM PT RTRN 9FT ADLT (ELECTROSURGICAL) ×1 IMPLANT
GLOVE BIO SURGEON STRL SZ8 (GLOVE) ×2 IMPLANT
GOWN PREVENTION PLUS LG XLONG (DISPOSABLE) ×2 IMPLANT
GOWN STRL REIN XL XLG (GOWN DISPOSABLE) ×2 IMPLANT
KIT ROOM TURNOVER AP CYSTO (KITS) ×2 IMPLANT
LOOP MONOPOLAR YLW (ELECTROSURGICAL) ×2 IMPLANT
MANIFOLD NEPTUNE II (INSTRUMENTS) ×2 IMPLANT
PACK CYSTO (CUSTOM PROCEDURE TRAY) ×2 IMPLANT
PAD ARMBOARD 7.5X6 YLW CONV (MISCELLANEOUS) ×2 IMPLANT
SYR 10ML LL (SYRINGE) ×2 IMPLANT
SYR 30ML LL (SYRINGE) ×2 IMPLANT
SYRINGE IRR TOOMEY STRL 70CC (SYRINGE) ×2 IMPLANT
TUBE CONNECTING 12X1/4 (SUCTIONS) ×2 IMPLANT
WATER STERILE IRR 1000ML POUR (IV SOLUTION) ×2 IMPLANT
WATER STERILE IRR 3000ML UROMA (IV SOLUTION) ×4 IMPLANT

## 2016-12-16 NOTE — Op Note (Signed)
PATIENT:  Carl Murphy  PRE-OPERATIVE DIAGNOSIS: Recurrent bladder cancer--2 centimeter right lateral wall tumor  POST-OPERATIVE DIAGNOSIS: Same, in addition, a posterior dome tumor, 2 centimeters in size  PROCEDURE: TURBT 2 cm bladder tumor  SURGEON:  Lillette Boxer. Flossie Wexler, M.D.  ANESTHESIA:  General  EBL:  Minimal  DRAINS: None  LOCAL MEDICATIONS USED:  None  SPECIMEN:  Bladder tumors  INDICATION: Carl Murphy is a 71 year old male with multiple issues, currently undergoing combination chemotherapy/radiotherapy for invasive bladder cancer.  Recent cystoscopy was performed for hematuria.  This revealed a recurrent tumor in his right lateral trigonal/bladder wall area.  He presents at this time for repeat TURBT.  Description of procedure: The patient was properly identified and marked (if applicable) in the holding area. They were then  taken to the operating room and placed on the table in a supine position. General anesthesia was then administered. Once fully anesthetized the patient was moved to the dorsolithotomy position and the genitalia and perineum were sterilely prepped and draped in standard fashion. An official timeout was then performed.  A 21 French panendoscope was advanced under direct vision through his urethra.  Prostate was nonobstructive.  His bladder was entered and inspected circumferentially.  Ureteral orifices were normal in configuration and location.  The bladder wall is normal except for 2 separate nodular/papillary bladder tumors.  One was just lateral to the trigone on the right, one was just off the midline in the posterior dome area.  Pictures were taken of these.  Following circumferential inspection, the cystoscope was removed and replaced with a 26 French resectoscope sheath, passed using the visual obturator.  The obturator was removed and the resectoscope element was placed with the 26 French/small cutting loop.  Saline was used for irrigation.  Both  bladder tumors were resected down to the muscle wall with the resectoscope loop.  All abnormal appearing urothelium was resected.  Resection was performed carefully as not to perforate the bladder wall.  Following adequate resection of all tissue, the cautery was used to provide hemostasis, which was basically resident at that time anyway.  The bladder tumor fragments were irrigated from the bladder with the Toomey syringe and sent for permanent specimen labeled "bladder tumor".  Inspection was performed of the resected sites, hemostasis was adequate.  At this point the scope was removed.  A 20 French Foley catheter was placed, filled with 10 mL of water in the balloon, and hooked to dependent drainage.  The procedure was then terminated.  The patient was awakened and taken to the PACU in stable condition.  He tolerated procedure well.    PLAN OF CARE: Discharge to home after PACU  PATIENT DISPOSITION:  PACU - hemodynamically stable.

## 2016-12-16 NOTE — Anesthesia Preprocedure Evaluation (Signed)
Anesthesia Evaluation  Patient identified by MRN, date of birth, ID band Patient awake    Reviewed: Allergy & Precautions, NPO status , Patient's Chart, lab work & pertinent test results, reviewed documented beta blocker date and time   Airway Mallampati: III  TM Distance: <3 FB Neck ROM: Full    Dental  (+) Missing, Caps, Chipped,    Pulmonary shortness of breath and with exertion, COPD,  COPD inhaler, Current Smoker,    breath sounds clear to auscultation (-) decreased breath sounds(-) wheezing      Cardiovascular hypertension, Pt. on medications and Pt. on home beta blockers +CHF and + DOE   Rhythm:Regular Rate:Normal     Neuro/Psych Depression    GI/Hepatic negative GI ROS,   Endo/Other    Renal/GU Renal InsufficiencyRenal disease     Musculoskeletal   Abdominal Normal abdominal exam  (+)   Peds  Hematology   Anesthesia Other Findings   Reproductive/Obstetrics                             Anesthesia Physical Anesthesia Plan  ASA: III  Anesthesia Plan: Spinal   Post-op Pain Management:    Induction:   Airway Management Planned: Simple Face Mask  Additional Equipment:   Intra-op Plan:   Post-operative Plan:   Informed Consent: I have reviewed the patients History and Physical, chart, labs and discussed the procedure including the risks, benefits and alternatives for the proposed anesthesia with the patient or authorized representative who has indicated his/her understanding and acceptance.     Plan Discussed with:   Anesthesia Plan Comments:         Anesthesia Quick Evaluation

## 2016-12-16 NOTE — Discharge Instructions (Signed)
Transurethral Resection of Bladder Tumor (TURBT)  °Definition:  °Transurethral Resection of the Bladder Tumor is a surgical procedure used to diagnose and remove tumors within the bladder. TURBT is the most common treatment for early stage bladder cancer.  ° °General instructions:  °Your recent bladder surgery requires very little post hospital care but some definite precautions.  °Despite the fact that no skin incisions were used, the area around the tumor removal site is raw and covered with scabs to promote healing and prevent bleeding. Certain precautions are needed to insure that the scabs are not disturbed over the next 2-4 weeks while the healing proceeds.  °Because the raw surface inside your bladder and the irritating effects of urine you may expect frequency of urination and/or urgency (a stronger desire to urinate) and perhaps even getting up at night more often. This will usually resolve or improve slowly over the healing period. You may see some blood in your urine over the first 6 weeks. Do not be alarmed, even if the urine was clear for a while. Get off your feet and drink lots of fluids until clearing occurs. If you start to pass clots or don't improve call us.  ° °Diet:  °You may return to your normal diet immediately. Because of the raw surface of your bladder, alcohol, spicy foods, foods high in acid and drinks with caffeine may cause irritation or frequency and should be used in moderation. To keep your urine flowing freely and avoid constipation, drink plenty of fluids during the day (8-10 glasses). Tip: Avoid cranberry juice because it is very acidic. °  °Activity:  °Your physical activity needs to be restricted somewhat.  We suggest that you reduce your activity under the circumstances until the bleeding has stopped. Heavy lifting (greater than 20 lbs.) and heavy exertion should be limited for 2-3 weeks. ° °Bowels:  °It is important to keep your bowels regular during the postoperative period.  Straining with bowel movements can cause bleeding. A bowel movement every other day is reasonable. Use a mild laxative if needed, such as milk of magnesia 2-3 tablespoons, or 2 Dulcolax tablets. Call if you continue to have problems. If you had been taking narcotics for pain, before, during or after your surgery, you may be constipated.  ° °Medication:  °You should resume your pre-surgery medications unless told not to. In addition you may be given an antibiotic to prevent or treat infection. Antibiotics are not always necessary. All medication should be taken as prescribed until the bottles are finished unless you are having an unusual reaction to one of the drugs.  °General Anesthetic, Adult  °A doctor specialized in giving anesthesia (anesthesiologist) or a nurse specialized in giving anesthesia (nurse anesthetist) gives medicine that makes you sleep while a procedure is performed (general anesthetic). Once the general anesthetic has been administered, you will be in a sleeplike state in which you feel no pain. After having a general anesthetic you may feel:  °Dizzy.  °Weak.  °Drowsy.  °Confused.  °These feelings are normal and can be expected to last for up to 24 hours after the procedure. ° ° °LET YOUR CAREGIVER KNOW ABOUT:  °Allergies you have.  °Medications you are taking, including herbs, eye drops, over the counter medications, dietary supplements, and creams.  °Previous problems you have had with anesthetics or numbing medicines.  °Use of cigarettes, alcohol, or illicit drugs.  °Possibility of pregnancy, if this applies.  °History of bleeding or blood disorders, including blood clots and clotting   disorders.  Previous surgeries you have had and types of anesthetics you have received.  Family medical history, especially anesthetic problems.  Other health problems.   AFTER THE PROCEDURE  After surgery, you will be taken to the recovery area where a nurse will monitor your progress. You will be allowed  to go home when you are awake, stable, taking fluids well, and without serious pain or complications.  For the first 24 hours following an anesthetic:  Have a responsible person with you.  Do not drive a car. If you are alone, do not take public transportation.  Do not engage in strenuous activity. You may usually resume normal activities the next day, or as advised by your caregiver.  Do not drink alcohol.  Do not take medicine that has not been prescribed by your caregiver.  Do not sign important papers or make important decisions as your judgement may be impaired.  You may resume a normal diet as directed.  Change bandages (dressings) as directed.  Only take over-the-counter or prescription medicines for pain, discomfort, or fever as directed by your caregiver.  If you have questions or problems that seem related to the anesthetic, call the hospital and ask for the anesthetist, anesthesiologist, or anesthesia department.   SEEK IMMEDIATE MEDICAL CARE IF:  You develop a rash.  You have difficulty breathing.  You have chest pain.  You have allergic problems.  You have uncontrolled nausea.  You have uncontrolled vomiting.   It is okay to remove the catheter on Wednesday morning.  If it is draining well and it is not too bloody.

## 2016-12-16 NOTE — Anesthesia Procedure Notes (Signed)
Spinal  Patient location during procedure: OR Start time: 12/16/2016 8:59 AM Staffing Resident/CRNA: Ranald Alessio A Preanesthetic Checklist Completed: patient identified, site marked, surgical consent, pre-op evaluation, timeout performed, IV checked, risks and benefits discussed and monitors and equipment checked Spinal Block Patient position: right lateral decubitus Prep: Betadine Patient monitoring: heart rate, cardiac monitor, continuous pulse ox and blood pressure Approach: right paramedian Location: L3-4 Injection technique: single-shot Needle Needle type: Spinocan  Needle gauge: 22 G Needle length: 9 cm Assessment Sensory level: T8 Additional Notes ATTEMPTS:1 TRAY [id number] TRAY EXPIRATION DATE:11/30/2017

## 2016-12-16 NOTE — Anesthesia Procedure Notes (Signed)
Procedure Name: MAC Date/Time: 12/16/2016 7:29 AM Performed by: Andree Elk, AMY A Pre-anesthesia Checklist: Patient identified, Timeout performed, Emergency Drugs available, Suction available and Patient being monitored Oxygen Delivery Method: Simple face mask

## 2016-12-16 NOTE — H&P (Signed)
Urology History and Physical Exam  CC: Bladder cancer  HPI: 71 year old male presents for repeat TURBT for management of recurrent bladder cancer. He has muscle invasive bladder cancer and has been started on chemotherapy as part of combination chemo/XRT. Recent office cystoscopy revealed recurrent disease on his lateral bladder wall.   PMH: Past Medical History:  Diagnosis Date  . Arthritis   . Cancer [facility])    Bladder Cancer  . CHF (congestive heart failure) ([place])   . COPD (chronic obstructive pulmonary disease) ([place])   . Depression   . Hard of hearing    bilat   . Hypertension   . Malignant neoplasm of trigone of bladder ([place]) 03/25/2016  . PONV (postoperative nausea and vomiting)   . Shortness of breath dyspnea    heat; walking up set of stairs; pt states can walk up 12-14 stairs slowly without having to stop to catch his breath    PSH: Past Surgical History:  Procedure Laterality Date  . CYSTOSCOPY N/A 06/16/2016   Procedure: CYSTOSCOPY;  Surgeon: Franchot Gallo, MD;  Location: [facility];  Service: Urology;  Laterality: N/A;  . TRANSURETHRAL RESECTION OF BLADDER TUMOR N/A 03/25/2016   Procedure: TRANSURETHRAL RESECTION OF BLADDER TUMOR (TURBT);  Surgeon: Franchot Gallo, MD;  Location: [facility];  Service: Urology;  Laterality: N/A;  . TRANSURETHRAL RESECTION OF BLADDER TUMOR N/A 06/16/2016   Procedure: TRANSURETHRAL RESECTION OF BLADDER TUMOR (TURBT);  Surgeon: Franchot Gallo, MD;  Location: [facility];  Service: Urology;  Laterality: N/A;    Allergies: No Known Allergies  Medications: Prescriptions Prior to Admission  Medication Sig Dispense Refill Last Dose  . aspirin 325 MG tablet Take 325 mg by mouth daily.   Taking  . buPROPion (WELLBUTRIN XL) 150 MG 24 hr tablet Take 150 mg by mouth daily.   2 Taking  . carvedilol (COREG) 25 MG tablet Take 1 tablet by mouth at bedtime.   11 Taking  . citalopram (CELEXA) 20 MG tablet Take 20 mg by mouth at bedtime.   11 Taking  .  fluticasone (FLONASE) 50 MCG/ACT nasal spray Place 2 sprays into both nostrils daily as needed for allergies or rhinitis.     Marland Kitchen loratadine (CLARITIN) 10 MG tablet Take 10 mg by mouth daily as needed for allergies.   Taking  . losartan-hydrochlorothiazide (HYZAAR) 100-25 MG tablet Take 1 tablet by mouth at bedtime.   11 Taking  . ondansetron (ZOFRAN) 8 MG tablet TAKE 1 TABLET BY MOUTH EVERY 8 HOURS AS NEEDED FOR NAUSEA. (Patient not taking: Reported on 12/09/2016) 30 tablet 0 Not Taking at Unknown time  . prochlorperazine (COMPAZINE) 10 MG tablet Take 1 tablet (10 mg total) by mouth every 6 (six) hours as needed for nausea or vomiting. (Patient not taking: Reported on 12/09/2016) 30 tablet 2 Not Taking at Unknown time  . promethazine (PHENERGAN) 25 MG suppository Place 1 suppository (25 mg total) rectally every 6 (six) hours as needed for nausea or vomiting. (Patient not taking: Reported on 12/09/2016) 5 each 0 Not Taking at Unknown time     Social History: Social History   Social History  . Marital status: Married    Spouse name: N/A  . Number of children: N/A  . Years of education: N/A   Occupational History  . Not on file.   Social History Main Topics  . Smoking status: Current Every Day Smoker    Packs/day: 2.00    Years: 50.00    Types: Cigarettes  . Smokeless  tobacco: Never Used  . Alcohol use No     Comment: rarestop drinking several years ago; then begin to drink again for approx 3 wks but has currently stopped  . Drug use: No  . Sexual activity: No   Other Topics Concern  . Not on file   Social History Narrative  . No narrative on file    Family History: Family History  Problem Relation Age of Onset  . Hypertension Mother   . Asthma Father   . Emphysema Father     Review of Systems: GU Review Male: Patient reports frequent urination, hard to postpone urination, and get up at night to urinate. Patient denies burning/ pain with urination, leakage of urine, stream  starts and stops, trouble starting your stream, have to strain to urinate , erection problems, and penile pain. Gastrointestinal (Upper): Patient denies nausea, vomiting, and indigestion/ heartburn. Gastrointestinal (Lower): Patient denies diarrhea and constipation. Constitutional: Patient denies fever, night sweats, weight loss, and fatigue. Skin: Patient denies skin rash/ lesion and itching. Eyes: Patient denies blurred vision and double vision. Ears/ Nose/ Throat: Patient reports sinus problems. Patient denies sore throat. Hematologic/Lymphatic: Patient reports easy bruising. Patient denies swollen glands. Cardiovascular: Patient denies leg swelling and chest pains. Respiratory: Patient reports shortness of breath. Patient denies cough. Endocrine: Patient denies excessive thirst. Musculoskeletal: Patient denies back pain and joint pain. Neurological: Patient denies headaches and dizziness. Psychologic: Patient denies depression and anxiety                  Physical Exam: @VITALS2 @ General: No acute distress.  Awake. Head:  Normocephalic.  Atraumatic. ENT:  EOMI.  Mucous membranes moist Neck:  Supple.  No lymphadenopathy. CV:  S1 present. S2 present. Regular rate. Pulmonary: Equal effort bilaterally.  Clear to auscultation bilaterally. Abdomen: Soft.  Non tender to palpation. Skin:  Normal turgor.  No visible rash. Extremity: No gross deformity of bilateral upper extremities.  No gross deformity of                             lower extremities. Neurologic: Alert. Appropriate mood.    Studies:  No results for input(s): HGB, WBC, PLT in the last 72 hours.  No results for input(s): NA, K, CL, CO2, BUN, CREATININE, CALCIUM, GFRNONAA, GFRAA in the last 72 hours.  Invalid input(s): MAGNESIUM   No results for input(s): INR, APTT in the last 72 hours.  Invalid input(s): PT   Invalid input(s): ABG    Assessment:  Recurrent urothelial cancer  Plan:Repeat TURBT--1 1/2 cm tumors.

## 2016-12-16 NOTE — Transfer of Care (Signed)
Immediate Anesthesia Transfer of Care Note  Patient: Carl Murphy  Procedure(s) Performed: Procedure(s): TRANSURETHRAL RESECTION OF BLADDER TUMOR (TURBT) (N/A)  Patient Location: PACU  Anesthesia Type:Spinal  Level of Consciousness: awake, alert , oriented and patient cooperative  Airway & Oxygen Therapy: Patient Spontanous Breathing and Patient connected to nasal cannula oxygen  Post-op Assessment: Report given to RN and Post -op Vital signs reviewed and stable  Post vital signs: Reviewed and stable  Last Vitals:  Vitals:   12/16/16 0715 12/16/16 0720  BP: (!) 86/50 (!) 88/52  Pulse:    Resp: 17 18  Temp:      Last Pain:  Vitals:   12/16/16 0633  TempSrc: Oral      Patients Stated Pain Goal: 2 ([id number])  Complications: No apparent anesthesia complications

## 2016-12-16 NOTE — Anesthesia Postprocedure Evaluation (Signed)
Anesthesia Post Note  Patient: Carl Murphy  Procedure(s) Performed: Procedure(s) (LRB): TRANSURETHRAL RESECTION OF BLADDER TUMOR (TURBT) (N/A)  Patient location during evaluation: PACU Anesthesia Type: Spinal Level of consciousness: awake and alert and oriented Pain management: pain level controlled Vital Signs Assessment: post-procedure vital signs reviewed and stable Respiratory status: spontaneous breathing and respiratory function stable Cardiovascular status: stable Postop Assessment: no headache and spinal receding Anesthetic complications: no     Last Vitals:  Vitals:   12/16/16 0845 12/16/16 0900  BP: (!) 79/50 (!) 90/54  Pulse: 68 62  Resp: 14 12  Temp:      Last Pain:  Vitals:   12/16/16 0633  TempSrc: Oral    LLE Motor Response: No movement due to regional block (12/16/16 0900) LLE Sensation: Numbness (12/16/16 0900) RLE Motor Response: No movement due to regional block (12/16/16 0900) RLE Sensation: Numbness (12/16/16 0900) L Sensory Level: T10-Umbilical region ([id number]) R Sensory Level: T10-Umbilical region ([id number])  Talha Iser A

## 2016-12-17 ENCOUNTER — Encounter (HOSPITAL_COMMUNITY): Payer: Self-pay | Admitting: Urology

## 2016-12-23 ENCOUNTER — Ambulatory Visit (INDEPENDENT_AMBULATORY_CARE_PROVIDER_SITE_OTHER): Payer: Medicare Other | Admitting: Urology

## 2016-12-23 DIAGNOSIS — C670 Malignant neoplasm of trigone of bladder: Secondary | ICD-10-CM

## 2016-12-28 ENCOUNTER — Encounter (HOSPITAL_COMMUNITY): Payer: Self-pay | Admitting: Hematology & Oncology

## 2016-12-30 ENCOUNTER — Encounter (HOSPITAL_COMMUNITY): Payer: Self-pay

## 2016-12-30 ENCOUNTER — Encounter (HOSPITAL_COMMUNITY): Payer: Medicare Other | Attending: Oncology | Admitting: Oncology

## 2016-12-30 VITALS — BP 153/71 | HR 59 | Temp 97.5°F | Resp 22 | Wt 187.2 lb

## 2016-12-30 DIAGNOSIS — Z720 Tobacco use: Secondary | ICD-10-CM | POA: Diagnosis not present

## 2016-12-30 DIAGNOSIS — N183 Chronic kidney disease, stage 3 (moderate): Secondary | ICD-10-CM

## 2016-12-30 DIAGNOSIS — D649 Anemia, unspecified: Secondary | ICD-10-CM | POA: Diagnosis not present

## 2016-12-30 DIAGNOSIS — C670 Malignant neoplasm of trigone of bladder: Secondary | ICD-10-CM | POA: Diagnosis not present

## 2016-12-30 NOTE — Progress Notes (Signed)
[name]  PROGRESS NOTE  Patient Care Team: [name], MD as PCP - General (Internal Medicine)  CHIEF COMPLAINTS/PURPOSE OF CONSULTATION:   Oncology History   Per available records had bladder tumor removed but had extension into the muscularis propria. CT showed no extension beyond the bladder. Has seen 2 urologists in consultation. Adamant about not having a cystectomy and ileal conduit.     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 No findings for local spread of disease or metastatic disease      03/25/2016 Surgery    TURBT 3cm right trigonal bladder tumor. followed by intravesical epirubicin instillation      03/25/2016 Pathology Results    bladder TURBT infiltrative high grade papillary urothelial carcinoma, invades the mulcularis propria (detrusor) no LVI       04/14/2016 Miscellaneous    [name] consultation in [place] for consideration of cystectomy. patient also offered repeat resection with BCG, and CHEMO/XRT      06/17/2016 Pathology Results    TURB- high grade papillary urothelial carcinoma with focal stromal invasion      07/22/2016 -  Chemotherapy    The patient had palonosetron (ALOXI) injection 0.25 mg, 0.25 mg, Intravenous,  Once, 2 of 5 cycles Administration: 0.25 mg (07/28/2016)  CARBOplatin (PARAPLATIN) 160 mg in sodium chloride 0.9 % 100 mL chemo infusion, 160 mg (100 % of original dose 160.6 mg), Intravenous,  Once, 2 of 5 cycles Dose modification:   (original dose 160.6 mg, Cycle 1),   (original dose 160.6 mg, Cycle 1),   (original dose 160.6 mg, Cycle 1),   (original dose 160.6 mg, Cycle 2) Administration: 180 mg (07/28/2016)  for chemotherapy treatment.         Radiation Therapy    Planned to be completed on 09/10/2016.      COPD Hypertensive cardiomyopathy  HISTORY OF PRESENTING ILLNESS:  Carl Murphy 71 y.o. male is  here for ongoing follow-up of muscle invasive carcinoma of the bladder. He completed concurrent carbo/XRT.    Patient presents today with his daughter for continued follow up of his bladder cancer.  States he recently had another cystoscopy performed by Franchot Gallo, MD on December 16, 2016. Biopsy performed for a residual mass and surgical pathology demonstrated invasive high grade papillary urothelial carcinoma, muscularis propria present and uninvolved by tumor.   States he has been more sob lately and he had a cough last week. He continues to smoke 2 packs per day.   States intermittent constipation with diarrhea.   Denies chest pain, fevers, chills.  States some dysuria while initiating urination but disappears while urinating.    MEDICAL HISTORY:  Past Medical History:  Diagnosis Date  . Arthritis   . Cancer [facility])    Bladder Cancer  . CHF (congestive heart failure) ([place])   . COPD (chronic obstructive pulmonary disease) ([place])   . Depression   . Hard of hearing    bilat   . Hypertension   . Malignant neoplasm of trigone of bladder ([place]) 03/25/2016  . PONV (postoperative nausea and vomiting)   . Shortness of breath dyspnea    heat; walking up set of stairs; pt states can walk up 12-14 stairs slowly without having to stop to catch his breath    SURGICAL HISTORY: Past Surgical History:  Procedure Laterality Date  . CYSTOSCOPY N/A 06/16/2016  Procedure: CYSTOSCOPY;  Surgeon: Franchot Gallo, MD;  [facility];  Service: Urology;  Laterality: N/A;  . TRANSURETHRAL RESECTION OF BLADDER TUMOR N/A 03/25/2016   Procedure: TRANSURETHRAL RESECTION OF BLADDER TUMOR (TURBT);  Surgeon: Franchot Gallo, MD;  Location: [facility];  Service: Urology;  Laterality: N/A;  . TRANSURETHRAL RESECTION OF BLADDER TUMOR N/A 06/16/2016   Procedure: TRANSURETHRAL RESECTION OF BLADDER TUMOR (TURBT);  Surgeon: Franchot Gallo, MD;  Location[facility];  Service: Urology;  Laterality: N/A;  .  TRANSURETHRAL RESECTION OF BLADDER TUMOR N/A 12/16/2016   Procedure: TRANSURETHRAL RESECTION OF BLADDER TUMOR (TURBT);  Surgeon: Franchot Gallo, MD;  Location: [facility];  Service: Urology;  Laterality: N/A;    SOCIAL HISTORY: Social History   Social History  . Marital status: Married    Spouse name: N/A  . Number of children: N/A  . Years of education: N/A   Occupational History  . Not on file.   Social History Main Topics  . Smoking status: Current Every Day Smoker    Packs/day: 2.00    Years: 50.00    Types: Cigarettes  . Smokeless tobacco: Never Used  . Alcohol use No     Comment: rarestop drinking several years ago; then begin to drink again for approx 3 wks but has currently stopped  . Drug use: No  . Sexual activity: No   Other Topics Concern  . Not on file   Social History Narrative  . 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 Hypertension Mother   . Asthma Father   . Emphysema Father     Both parents deceased Mom was early 57's when she died; he was around 80-something Dad had asthma and emphysema really bad; thinks his heart finally gave out He says his mother's "intestines broke" and it was "fast forward downhill from there."  He is an only child.   ALLERGIES:  has No Known Allergies.  MEDICATIONS:  Current Outpatient Prescriptions  Medication Sig Dispense Refill  . aspirin 325 MG tablet Take 325 mg by mouth daily.    Marland Kitchen buPROPion (WELLBUTRIN XL) 150 MG  24 hr tablet Take 150 mg by mouth daily.   2  . carvedilol (COREG) 25 MG tablet Take 1 tablet by mouth at bedtime.   11  . cephALEXin (KEFLEX) 500 MG capsule Take 1 capsule (500 mg total) by mouth 2 (two) times daily. 6 capsule 0  . citalopram (CELEXA) 20 MG tablet Take 20 mg by mouth at bedtime.   11  . fluticasone (FLONASE) 50 MCG/ACT nasal spray Place 2 sprays into both nostrils daily as needed for allergies or rhinitis.    Marland Kitchen loratadine (CLARITIN) 10 MG tablet Take 10 mg by mouth daily as needed for allergies.    Marland Kitchen losartan-hydrochlorothiazide (HYZAAR) 100-25 MG tablet Take 1 tablet by mouth at bedtime.   11  . ondansetron (ZOFRAN) 4 MG tablet Take 1 tablet (4 mg total) by mouth every 8 (eight) hours  as needed for nausea or vomiting. 20 tablet 0  . ondansetron (ZOFRAN) 8 MG tablet TAKE 1 TABLET BY MOUTH EVERY 8 HOURS AS NEEDED FOR NAUSEA. (Patient not taking: Reported on 12/09/2016) 30 tablet 0  . prochlorperazine (COMPAZINE) 10 MG tablet Take 1 tablet (10 mg total) by mouth every 6 (six) hours as needed for nausea or vomiting. (Patient not taking: Reported on 12/09/2016) 30 tablet 2  . promethazine (PHENERGAN) 25 MG suppository Place 1 suppository (25 mg total) rectally every 6 (six) hours as needed for nausea or vomiting. (Patient not taking: Reported on 12/09/2016) 5 each 0   No current facility-administered medications for this visit.    Review of Systems  Constitutional: Negative.  Negative for chills and fever.  HENT: Negative.   Eyes: Negative.   Respiratory: Positive for cough (last week) and shortness of breath (increased sob lately compared to previously).   Cardiovascular: Negative.  Negative for chest pain.  Gastrointestinal: Positive for constipation (intermittent) and diarrhea (intermittent ).  Genitourinary: Positive for dysuria (some while initiating urination but disappears during urination).  Musculoskeletal: Negative.   Skin: Negative.   Neurological: Negative.     Endo/Heme/Allergies: Negative.   Psychiatric/Behavioral: Negative.   All other systems reviewed and are negative. 14 point ROS was done and is otherwise as detailed above or in HPI    PHYSICAL EXAMINATION: ECOG PERFORMANCE STATUS: 1 - Symptomatic but completely ambulatory  Vitals:   12/30/16 0904  BP: (!) 153/71  Pulse: (!) 59  Resp: (!) 22  Temp: 97.5 F (36.4 C)   Filed Weights   12/30/16 0904  Weight: 187 lb 3.2 oz (84.9 kg)      Physical Exam  Constitutional: He is oriented to person, place, and time and well-developed, well-nourished, and in no distress. No distress.  HENT:  Head: Normocephalic and atraumatic.  Mouth/Throat: Oropharynx is clear and moist. No oropharyngeal exudate.  Eyes: Conjunctivae and EOM are normal. Pupils are equal, round, and reactive to light. Right eye exhibits no discharge. Left eye exhibits no discharge. No scleral icterus.  Neck: Normal range of motion. Neck supple. No tracheal deviation present. No thyromegaly present.  Cardiovascular: Normal rate, regular rhythm and normal heart sounds.   No murmur heard. Pulmonary/Chest: Effort normal and breath sounds normal. No respiratory distress. He has no wheezes.  Bilateral inspiratory wheezing in all lung fields.  Abdominal: Soft. Bowel sounds are normal. He exhibits no distension and no mass. There is no tenderness. There is no rebound and no guarding.  Musculoskeletal: Normal range of motion. He exhibits no edema or tenderness.  Lymphadenopathy:    He has no cervical adenopathy.  Neurological: He is alert and oriented to person, place, and time. No cranial nerve deficit. Gait normal. Coordination normal.  Skin: Skin is warm and dry. No rash noted. He is not diaphoretic. No erythema.  Psychiatric: Mood, memory, affect and judgment normal.  Nursing note and vitals reviewed.   LABORATORY DATA:  I have reviewed the data as listed Lab Results  Component Value Date   WBC 6.6 12/11/2016   HGB  12.3 (L) 12/11/2016   HCT 35.7 (L) 12/11/2016   MCV 99.2 12/11/2016   PLT 229 12/11/2016   CMP     Component Value Date/Time   NA 135 12/11/2016 1335   K 3.9 12/11/2016 1335   CL 101 12/11/2016 1335   CO2 26 12/11/2016 1335   GLUCOSE 107 (H) 12/11/2016 1335   BUN 32 (H) 12/11/2016 1335   CREATININE  1.19 12/11/2016 1335   CALCIUM 8.7 (L) 12/11/2016 1335   PROT 7.2 10/06/2016 1303   ALBUMIN 3.6 10/06/2016 1303   AST 22 10/06/2016 1303   ALT 12 (L) 10/06/2016 1303   ALKPHOS 64 10/06/2016 1303   BILITOT 0.3 10/06/2016 1303   GFRNONAA >60 12/11/2016 1335   GFRAA >60 12/11/2016 1335     RADIOGRAPHIC STUDIES: I have personally reviewed the radiological images as listed and agreed with the findings in the report. CLINICAL DATA: Hematuria and frequent urination.  EXAM: CT ABDOMEN AND PELVIS WITHOUT AND WITH CONTRAST  TECHNIQUE: Multidetector CT imaging of the abdomen and pelvis was performed following the standard protocol before and following the bolus administration of intravenous contrast.  CONTRAST: 112mL OMNIPAQUE IOHEXOL 300 MG/ML SOLN  COMPARISON: CT scan 03/31/2013  FINDINGS: Lower chest: The lung bases are clear of acute process. No worrisome pulmonary lesions. No pleural effusion. The heart is normal in size. No pericardial effusion. Coronary artery calcifications are noted. Dense distal aortic calcifications are noted. The esophagus is grossly normal.  Hepatobiliary: Small low-attenuation liver lesions are stable and likely small cysts. Lesion and segment 8 is also stable and likely a hemangioma. No worrisome hepatic lesions or intrahepatic biliary dilatation. The gallbladder is normal. No common bile duct dilatation.  Pancreas: No mass, inflammation or ductal dilatation.  Spleen: Normal size. No focal lesions.  Adrenals/Urinary Tract: The adrenal glands are normal and stable.  Numerous left renal artery calcifications. I do not see any  definite calculi in the left collecting system. Small left renal cysts are noted. The no right renal calculi or hydronephrosis. Both ureters are normal. No ureteral calculi. No significant collecting system abnormality on the delayed images.  There is a large irregular bladder mass at on the right side. This demonstrates moderate contrast enhancement. It measures 3.2 x 3.0 cm. No ureteral obstruction.  Stomach/Bowel: The stomach, duodenum, small bowel and colon are unremarkable without oral contrast. No inflammatory changes, mass lesions or obstructive findings. The terminal ileum is normal. The appendix is normal. Moderate sigmoid diverticulosis without findings for acute diverticulitis.  Vascular/Lymphatic: Advanced aortic and branch vessel atherosclerotic calcifications. No focal aneurysm or dissection. The major venous structures are patent.  No mesenteric or retroperitoneal mass or lymphadenopathy. Small scattered lymph nodes are noted. Small bilateral external iliac artery lymph nodes are stable. No pelvic lymphadenopathy. No inguinal adenopathy. The prostate gland and seminal vesicles are grossly normal. New line small  Small Periumbilical abdominal wall hernia containing fat and a small bowel loop. This has enlarged since prior CT scan.  Other: No subcutaneous lesions or ascites.  Musculoskeletal: No significant bony findings.  IMPRESSION: 1. 3.2 x 3.0 cm bladder mass consistent with transitional cell carcinoma. No findings for local spread of disease or metastatic disease. 2. No worrisome renal or ureteral lesions. 3. Small benign-appearing and stable hepatic lesions. 4. Advanced atherosclerotic calcifications involving the aorta and branch vessels. 5. Small periumbilical abdominal wall hernia containing fat and a small bowel loop.   Electronically Signed  By: Marijo Sanes M.D.  On: 02/18/2016 16:47   ASSESSMENT & PLAN:  Stage II (T2N0M0)  Muscle invasive carcinoma of the trigone of the bladder Tobacco Abuse CKD Anemia Hypotension  He has completed recommended therapy. He did remarkably well. He has had a repeat cystoscopy and apparently has residual disease. Cystoscopy with Biopsy performed on 12/16/16 for the residual mass and it was resected; surgical pathology demonstrated invasive high grade papillary urothelial carcinoma, muscularis propria present and uninvolved  by tumor.   Reviewed surgical path report from his most recent cystscopy with biopsy on 12/16/16.  I have discussed with him at length about proceeding with definitive treatment with cystectomy, however patient refuses at this time. Patient doesn't want to see a pulmonologist for preop plumary clearance either, although I have told him he still needs a pulmonologist to manage his COPD. Counseled on smoke cessation.   Follow up with his urologist  in 3 months for repeated cystoscopy. Patient prefers close observation.  I wil plan to order restaging CTchest,  abdomen and pelvis with contrast to be performed in 3 months.  Return to clinic in 3 months follow and to review CT results .   This document serves as a record of services personally performed by Twana First, MD. It was created on her behalf by Shirlean Mylar, a trained medical scribe. The creation of this record is based on the scribe's personal observations and the provider's statements to them. This document has been checked and approved by the attending provider.   I have reviewed the above documentation for accuracy and completeness and I agree with the above. Twana First, MD  This note was electronically signed.    [employer]  12/30/2016 9:41 AM

## 2016-12-30 NOTE — Patient Instructions (Signed)
[place] at [facility] Discharge Instructions  RECOMMENDATIONS MADE BY THE CONSULTANT AND ANY TEST RESULTS WILL BE SENT TO YOUR REFERRING PHYSICIAN.  You were seen today by Dr. Barron Schmid CT scan in 3 months  Follow up in 3 months  Thank you for choosing [place] at [facility] to provide your oncology and hematology care.  To afford each patient quality time with our provider, please arrive at least 15 minutes before your scheduled appointment time.    If you have a lab appointment with the [facility] please come in thru the  Main Entrance and check in at the main information desk  You need to re-schedule your appointment should you arrive 10 or more minutes late.  We strive to give you quality time with our providers, and arriving late affects you and other patients whose appointments are after yours.  Also, if you no show three or more times for appointments you may be dismissed from the clinic at the providers discretion.     Again, thank you for choosing [facility].  Our hope is that these requests will decrease the amount of time that you wait before being seen by our physicians.       _____________________________________________________________  Should you have questions after your visit to [facility], please contact our office at [phone] between the hours of 8:30 a.m. and 4:30 p.m.  Voicemails left after 4:30 p.m. will not be returned until the following business day.  For prescription refill requests, have your pharmacy contact our office.       Resources For Cancer Patients and their Caregivers ? American Cancer Society: Can assist with transportation, wigs, general needs, runs Look Good Feel Better.        (240)691-4551 ? Cancer Care: Provides financial assistance, online support groups, medication/co-pay assistance.  1-800-813-HOPE (505)619-9696) ? [place] Assists  [place] cancer patients and their families through emotional , educational and financial support.  5035213185 ? [place] DSS Where to apply for food stamps, Medicaid and utility assistance. (717)120-7970 ? [employer]: Transportation to medical appointments. 754-028-4561 ? Social Security Administration: May apply for disability if have a Stage IV cancer. 217-125-5254 5701849981 ? [employer], Disability and Transit Services: Assists with nutrition, care and transit needs. [place] Support Programs: @10RELATIVEDAYS @ > Cancer Support Group  2nd Tuesday of the month 1pm-2pm, Journey Room  > Creative Journey  3rd Tuesday of the month 1130am-1pm, Journey Room  > Look Good Feel Better  1st Wednesday of the month 10am-12 noon, Journey Room (Call [place] to register [phone])

## 2017-03-19 ENCOUNTER — Other Ambulatory Visit (HOSPITAL_COMMUNITY): Payer: Medicare Other

## 2017-03-24 ENCOUNTER — Ambulatory Visit (INDEPENDENT_AMBULATORY_CARE_PROVIDER_SITE_OTHER): Payer: Medicare Other | Admitting: Urology

## 2017-03-24 ENCOUNTER — Other Ambulatory Visit (HOSPITAL_COMMUNITY)
Admission: AD | Admit: 2017-03-24 | Discharge: 2017-03-24 | Disposition: A | Discharge status: Home / Self Care | Payer: Medicare Other | Source: Skilled Nursing Facility | Attending: Urology | Admitting: Urology

## 2017-03-24 DIAGNOSIS — C678 Malignant neoplasm of overlapping sites of bladder: Secondary | ICD-10-CM | POA: Diagnosis not present

## 2017-03-25 ENCOUNTER — Other Ambulatory Visit: Payer: Self-pay | Admitting: Urology

## 2017-03-25 DIAGNOSIS — C678 Malignant neoplasm of overlapping sites of bladder: Secondary | ICD-10-CM

## 2017-03-26 ENCOUNTER — Ambulatory Visit (HOSPITAL_COMMUNITY): Payer: Medicare Other

## 2017-03-26 LAB — URINE CULTURE: Culture: NO GROWTH

## 2017-03-31 ENCOUNTER — Ambulatory Visit (HOSPITAL_COMMUNITY): Payer: Medicare Other

## 2017-04-07 ENCOUNTER — Ambulatory Visit (HOSPITAL_COMMUNITY)
Admission: RE | Admit: 2017-04-07 | Discharge: 2017-04-07 | Disposition: A | Discharge status: Home / Self Care | Payer: Medicare Other | Source: Ambulatory Visit | Attending: Urology | Admitting: Urology

## 2017-04-07 DIAGNOSIS — K429 Umbilical hernia without obstruction or gangrene: Secondary | ICD-10-CM | POA: Insufficient documentation

## 2017-04-07 DIAGNOSIS — I700 Atherosclerosis of aorta: Secondary | ICD-10-CM | POA: Insufficient documentation

## 2017-04-07 DIAGNOSIS — N200 Calculus of kidney: Secondary | ICD-10-CM | POA: Insufficient documentation

## 2017-04-07 DIAGNOSIS — C678 Malignant neoplasm of overlapping sites of bladder: Secondary | ICD-10-CM

## 2017-04-07 LAB — POCT I-STAT CREATININE: Creatinine, Ser: 2 mg/dL — ABNORMAL HIGH (ref 0.61–1.24)

## 2017-04-07 MED ORDER — IOPAMIDOL (ISOVUE-300) INJECTION 61%
100.0000 mL | Freq: Once | INTRAVENOUS | Status: AC | PRN
  Administered 2017-04-07: 100 mL via INTRAVENOUS

## 2017-04-15 ENCOUNTER — Ambulatory Visit (HOSPITAL_COMMUNITY): Payer: Medicare Other

## 2017-05-05 ENCOUNTER — Ambulatory Visit (INDEPENDENT_AMBULATORY_CARE_PROVIDER_SITE_OTHER): Payer: Medicare Other | Admitting: Urology

## 2017-05-05 ENCOUNTER — Other Ambulatory Visit (HOSPITAL_COMMUNITY)
Admission: AD | Admit: 2017-05-05 | Discharge: 2017-05-05 | Disposition: A | Discharge status: Home / Self Care | Payer: Medicare Other | Source: Skilled Nursing Facility | Attending: Urology | Admitting: Urology

## 2017-05-05 DIAGNOSIS — C678 Malignant neoplasm of overlapping sites of bladder: Secondary | ICD-10-CM | POA: Diagnosis not present

## 2017-05-06 ENCOUNTER — Other Ambulatory Visit: Payer: Self-pay | Admitting: Urology

## 2017-05-07 ENCOUNTER — Encounter (HOSPITAL_COMMUNITY): Payer: Self-pay | Admitting: Emergency Medicine

## 2017-05-07 ENCOUNTER — Inpatient Hospital Stay (HOSPITAL_COMMUNITY)
Admission: EM | Admit: 2017-05-07 | Discharge: 2017-05-12 | DRG: 687 | Disposition: A | Discharge status: Home / Self Care | Payer: Medicare Other | Attending: Internal Medicine | Admitting: Internal Medicine

## 2017-05-07 DIAGNOSIS — C670 Malignant neoplasm of trigone of bladder: Secondary | ICD-10-CM | POA: Diagnosis present

## 2017-05-07 DIAGNOSIS — R310 Gross hematuria: Secondary | ICD-10-CM

## 2017-05-07 DIAGNOSIS — J449 Chronic obstructive pulmonary disease, unspecified: Secondary | ICD-10-CM | POA: Diagnosis present

## 2017-05-07 DIAGNOSIS — K429 Umbilical hernia without obstruction or gangrene: Secondary | ICD-10-CM | POA: Diagnosis present

## 2017-05-07 DIAGNOSIS — N029 Recurrent and persistent hematuria with unspecified morphologic changes: Secondary | ICD-10-CM | POA: Diagnosis present

## 2017-05-07 DIAGNOSIS — R52 Pain, unspecified: Secondary | ICD-10-CM

## 2017-05-07 DIAGNOSIS — I130 Hypertensive heart and chronic kidney disease with heart failure and stage 1 through stage 4 chronic kidney disease, or unspecified chronic kidney disease: Secondary | ICD-10-CM | POA: Diagnosis present

## 2017-05-07 DIAGNOSIS — C661 Malignant neoplasm of right ureter: Secondary | ICD-10-CM | POA: Diagnosis present

## 2017-05-07 DIAGNOSIS — F17210 Nicotine dependence, cigarettes, uncomplicated: Secondary | ICD-10-CM | POA: Diagnosis present

## 2017-05-07 DIAGNOSIS — N183 Chronic kidney disease, stage 3 (moderate): Secondary | ICD-10-CM | POA: Diagnosis present

## 2017-05-07 DIAGNOSIS — N1330 Unspecified hydronephrosis: Secondary | ICD-10-CM | POA: Diagnosis present

## 2017-05-07 DIAGNOSIS — Z79899 Other long term (current) drug therapy: Secondary | ICD-10-CM | POA: Diagnosis not present

## 2017-05-07 DIAGNOSIS — I5032 Chronic diastolic (congestive) heart failure: Secondary | ICD-10-CM | POA: Diagnosis present

## 2017-05-07 DIAGNOSIS — F329 Major depressive disorder, single episode, unspecified: Secondary | ICD-10-CM | POA: Diagnosis present

## 2017-05-07 DIAGNOSIS — D649 Anemia, unspecified: Secondary | ICD-10-CM | POA: Diagnosis not present

## 2017-05-07 DIAGNOSIS — N3289 Other specified disorders of bladder: Secondary | ICD-10-CM | POA: Diagnosis present

## 2017-05-07 DIAGNOSIS — N179 Acute kidney failure, unspecified: Secondary | ICD-10-CM | POA: Diagnosis present

## 2017-05-07 DIAGNOSIS — F17200 Nicotine dependence, unspecified, uncomplicated: Secondary | ICD-10-CM | POA: Diagnosis not present

## 2017-05-07 DIAGNOSIS — D62 Acute posthemorrhagic anemia: Secondary | ICD-10-CM | POA: Diagnosis present

## 2017-05-07 DIAGNOSIS — Z7982 Long term (current) use of aspirin: Secondary | ICD-10-CM

## 2017-05-07 DIAGNOSIS — D500 Iron deficiency anemia secondary to blood loss (chronic): Secondary | ICD-10-CM | POA: Diagnosis present

## 2017-05-07 LAB — CBC WITH DIFFERENTIAL/PLATELET
BASOS PCT: 0 %
Basophils Absolute: 0 10*3/uL (ref 0.0–0.1)
EOS ABS: 0.1 10*3/uL (ref 0.0–0.7)
EOS PCT: 1 %
HEMATOCRIT: 18 % — AB (ref 39.0–52.0)
Hemoglobin: 5.9 g/dL — CL (ref 13.0–17.0)
Lymphocytes Relative: 6 %
Lymphs Abs: 0.4 10*3/uL — ABNORMAL LOW (ref 0.7–4.0)
MCH: 32.2 pg (ref 26.0–34.0)
MCHC: 32.8 g/dL (ref 30.0–36.0)
MCV: 98.4 fL (ref 78.0–100.0)
MONO ABS: 0.4 10*3/uL (ref 0.1–1.0)
MONOS PCT: 7 %
Neutro Abs: 5.6 10*3/uL (ref 1.7–7.7)
Neutrophils Relative %: 86 %
PLATELETS: 346 10*3/uL (ref 150–400)
RBC: 1.83 MIL/uL — ABNORMAL LOW (ref 4.22–5.81)
RDW: 18.5 % — AB (ref 11.5–15.5)
WBC: 6.5 10*3/uL (ref 4.0–10.5)

## 2017-05-07 LAB — URINE CULTURE: Culture: NO GROWTH

## 2017-05-07 LAB — PREPARE RBC (CROSSMATCH)

## 2017-05-07 LAB — BASIC METABOLIC PANEL
Anion gap: 11 (ref 5–15)
BUN: 43 mg/dL — ABNORMAL HIGH (ref 6–20)
CALCIUM: 8.1 mg/dL — AB (ref 8.9–10.3)
CO2: 20 mmol/L — AB (ref 22–32)
CREATININE: 2.87 mg/dL — AB (ref 0.61–1.24)
Chloride: 102 mmol/L (ref 101–111)
GFR calc non Af Amer: 21 mL/min — ABNORMAL LOW (ref >60)
GFR, EST AFRICAN AMERICAN: 24 mL/min — AB (ref >60)
Glucose, Bld: 103 mg/dL — ABNORMAL HIGH (ref 65–99)
Potassium: 4.8 mmol/L (ref 3.5–5.1)
Sodium: 133 mmol/L — ABNORMAL LOW (ref 135–145)

## 2017-05-07 LAB — ABO/RH
ABO/RH(D): O POS
ABO/RH(D): O POS

## 2017-05-07 MED ORDER — CARVEDILOL 25 MG PO TABS
25.0000 mg | ORAL_TABLET | Freq: Two times a day (BID) | ORAL | Status: DC
  Administered 2017-05-07 – 2017-05-12 (×10): 25 mg via ORAL
  Filled 2017-05-07 (×10): qty 1

## 2017-05-07 MED ORDER — CITALOPRAM HYDROBROMIDE 20 MG PO TABS
20.0000 mg | ORAL_TABLET | Freq: Every day | ORAL | Status: DC
  Administered 2017-05-07 – 2017-05-12 (×6): 20 mg via ORAL
  Filled 2017-05-07 (×6): qty 1

## 2017-05-07 MED ORDER — SODIUM CHLORIDE 0.9 % IV SOLN: Freq: Once | INTRAVENOUS | Status: DC

## 2017-05-07 MED ORDER — TRAZODONE HCL 50 MG PO TABS
50.0000 mg | ORAL_TABLET | Freq: Every evening | ORAL | Status: DC | PRN
  Administered 2017-05-07 – 2017-05-11 (×2): 50 mg via ORAL
  Filled 2017-05-07 (×3): qty 1

## 2017-05-07 MED ORDER — SODIUM CHLORIDE 0.9 % IV SOLN
Freq: Once | INTRAVENOUS | Status: AC
  Administered 2017-05-07: 13:00:00 via INTRAVENOUS

## 2017-05-07 MED ORDER — NICOTINE 21 MG/24HR TD PT24
21.0000 mg | MEDICATED_PATCH | Freq: Once | TRANSDERMAL | Status: AC
  Administered 2017-05-07: 21 mg via TRANSDERMAL
  Filled 2017-05-07 (×2): qty 1

## 2017-05-07 MED ORDER — ACETAMINOPHEN 325 MG PO TABS
650.0000 mg | ORAL_TABLET | Freq: Four times a day (QID) | ORAL | Status: DC | PRN
  Administered 2017-05-08: 650 mg via ORAL
  Filled 2017-05-07: qty 2

## 2017-05-07 MED ORDER — SODIUM CHLORIDE 0.9 % IV SOLN
INTRAVENOUS | Status: DC
  Administered 2017-05-07: 75 mL/h via INTRAVENOUS
  Administered 2017-05-08: 09:00:00 via INTRAVENOUS

## 2017-05-07 MED ORDER — ACETAMINOPHEN 650 MG RE SUPP: 650.0000 mg | Freq: Four times a day (QID) | RECTAL | Status: DC | PRN

## 2017-05-07 MED ORDER — OXYCODONE HCL 5 MG PO TABS
5.0000 mg | ORAL_TABLET | Freq: Four times a day (QID) | ORAL | Status: DC | PRN
  Administered 2017-05-08: 5 mg via ORAL
  Filled 2017-05-07: qty 1

## 2017-05-07 MED ORDER — SODIUM CHLORIDE 0.9 % IV SOLN
Freq: Once | INTRAVENOUS | Status: AC
  Administered 2017-05-07: 22:00:00 via INTRAVENOUS

## 2017-05-07 NOTE — ED Notes (Signed)
Date and time results received: 05/07/17 10:49 AM  (use smartphrase ".now" to insert current time)  Test: HGB Critical Value: 5.9  Name of Provider Notified: Dr. Sabra Heck  Orders Received? Or Actions Taken?: Orders Received - See Orders for details

## 2017-05-07 NOTE — ED Notes (Signed)
First unit of blood completed at 1446.

## 2017-05-07 NOTE — H&P (Signed)
History and Physical  Carl Murphy:[id number] DOB: 1946-03-06 DOA: 05/07/2017  PCP: [name], MD  Patient coming from: home  Chief Complaint: bleeding  HPI:  71 year old man with history of bladder cancer, followed by Dr. Eulogio Ditch, with ongoing hematuria, which has worsened. Recent blood work revealed profound anemia and he was referred to the emergency department for further evaluation where severe anemia was confirmed. Plans for blood transfusion and transfer to [facility] long for urology intervention.  Patient reports he's had ongoing mild hematuria for some time, however which worsened throughout the weekend. He was seen in the office 6/5 by urology at which time he was having obstructive symptoms and a Foley catheter was placed with some relief pain. He has blood work drawn at that time with results available 6/6. He was advised to get the emergency department yesterday but declined to do so. No specific aggravating factors. Stop taking aspirin a week ago. Associated fatigue and loss of energy.  Besides gross hematuria he has no other complaints. Per the emergency department physician he did have some obstruction noted in Foley catheter was changed in the emergency department.  ED Course: Afebrile, vital stable. Foley catheter change. Typed and crossed and transfusion started   Review of Systems:  Negative for fever, visual changes, sore throat, rash, new muscle aches, chest pain, SOB, n/v/abdominal pain.  Positive for some pain when urinating prior to Foley catheter placement  Past Medical History:  Diagnosis Date  . Arthritis   . Cancer [facility])    Bladder Cancer  . CHF (congestive heart failure) ([place])   . COPD (chronic obstructive pulmonary disease) ([place])   . Depression   . Hard of hearing    bilat   . Hypertension   . Malignant neoplasm of trigone of bladder ([place]) 03/25/2016  . PONV (postoperative nausea and vomiting)   . Shortness of breath dyspnea    heat; walking up  set of stairs; pt states can walk up 12-14 stairs slowly without having to stop to catch his breath    Past Surgical History:  Procedure Laterality Date  . CYSTOSCOPY N/A 06/16/2016   Procedure: CYSTOSCOPY;  Surgeon: Franchot Gallo, MD;  Location: [facility];  Service: Urology;  Laterality: N/A;  . TRANSURETHRAL RESECTION OF BLADDER TUMOR N/A 03/25/2016   Procedure: TRANSURETHRAL RESECTION OF BLADDER TUMOR (TURBT);  Surgeon: Franchot Gallo, MD;  Location: [facility];  Service: Urology;  Laterality: N/A;  . TRANSURETHRAL RESECTION OF BLADDER TUMOR N/A 06/16/2016   Procedure: TRANSURETHRAL RESECTION OF BLADDER TUMOR (TURBT);  Surgeon: Franchot Gallo, MD;  Location: [facility];  Service: Urology;  Laterality: N/A;  . TRANSURETHRAL RESECTION OF BLADDER TUMOR N/A 12/16/2016   Procedure: TRANSURETHRAL RESECTION OF BLADDER TUMOR (TURBT);  Surgeon: Franchot Gallo, MD;  Location: [facility];  Service: Urology;  Laterality: N/A;     reports that he has been smoking Cigarettes.  He has a 100.00 pack-year smoking history. He has never used smokeless tobacco. He reports that he does not drink alcohol or use drugs. Mobility: Ambulatory  No Known Allergies  Family History  Problem Relation Age of Onset  . Hypertension Mother   . Asthma Father   . Emphysema Father      Prior to Admission medications   Medication Sig Start Date End Date Taking? Authorizing Provider  aspirin 325 MG tablet Take 325 mg by mouth daily.    [provider]  buPROPion (WELLBUTRIN XL) 150 MG 24 hr tablet Take 150 mg by mouth daily.  07/07/16   [provider]  carvedilol (COREG) 25 MG tablet Take 25 mg by mouth 2 (two) times daily.  01/31/16   [provider]  cephALEXin (KEFLEX) 500 MG capsule Take 1 capsule (500 mg total) by mouth 2 (two) times daily. 12/16/16   Franchot Gallo, MD  citalopram (CELEXA) 20 MG tablet Take 20 mg by mouth at bedtime.  01/31/16   [provider]  fluticasone (FLONASE)  50 MCG/ACT nasal spray Place 2 sprays into both nostrils daily as needed for allergies or rhinitis.    [provider]  loratadine (CLARITIN) 10 MG tablet Take 10 mg by mouth daily as needed for allergies.    [provider]  losartan-hydrochlorothiazide (HYZAAR) 100-25 MG tablet Take 1 tablet by mouth at bedtime.  01/31/16   [provider]  ondansetron (ZOFRAN) 4 MG tablet Take 1 tablet (4 mg total) by mouth every 8 (eight) hours as needed for nausea or vomiting. 12/16/16   Franchot Gallo, MD  oxyCODONE (OXY IR/ROXICODONE) 5 MG immediate release tablet Take 5 mg by mouth every 6 (six) hours as needed. For pain. 05/05/17   [provider]  Polyethyl Glycol-Propyl Glycol (LUBRICANT EYE DROPS) 0.4-0.3 % SOLN Place 1-2 drops into both eyes 3 (three) times daily as needed (for dry/irritated eyes).    [provider]    Physical Exam:   Constitutional: Appears calm, comfortable lying on stretcher  Eyes: Wearing contacts. Pupils, irises, lids appear unremarkable.  ENT: Grossly normal hearing. Lips and tongue appear unremarkable.  Neck: No lymphadenopathy or masses. No thyromegaly.  Cardiovascular regular rate and rhythm. No murmur, gallop. No lower extremity edema.  Respiratory: Clear to auscultation bilaterally. No wheezes, rales or rhonchi. Normal respiratory effort.  Abdomen soft, nontender, nondistended. No hepatomegaly.  Skin no rash or induration. Nontender to palpation.  Musculoskeletal: Grossly normal tone and strength all extremities. No abnormal movements.  Psychiatric: Grossly normal mood and affect. Speech fluent and clear.   Wt Readings from Last 3 Encounters:  05/07/17 81.6 kg (180 lb)  12/30/16 84.9 kg (187 lb 3.2 oz)  12/16/16 84.4 kg (186 lb)    I have personally reviewed following labs and imaging studies  Labs:   BUN 43, creatinine 2.7. Potassium 4.8.  Hemoglobin 5.9. Remainder CBC unremarkable.  Imaging studies:       Medical tests:     Test discussed with performing physician:  Discussed consultation with urologist, recommendations as below  Decision to obtain old records:     Review and summation of old records:     Principal Problem:   Acute blood loss anemia Active Problems:   Malignant neoplasm of trigone of bladder (HCC)   Gross hematuria   AKI (acute kidney injury) ([place])   COPD (chronic obstructive pulmonary disease) ([place])   Tobacco dependence   Assessment/Plan #1: Acute blood loss anemia, symptomatic, profound, secondary to gross hematuria, presumably related to known bladder cancer. -Hemodynamics are stable, other than fatigue and decreased energy is asymptomatic. -Transfusion 2 units packed red blood cells is already underway. Type and cross 2 add'l units. Serial CBC. Foley catheter is draining frank blood. -Discussed with Dr. Alyson Ingles urology, he has requested transfer to [facility] long for definitive intervention. Keep nothing by mouth.  #2: Acute kidney injury, post renal secondary to obstructive hematuria with clots. -Now draining after Foley catheter exchange -Transfuse blood, and then IV fluids, serial BMP -Expect spontaneous resolution with treatment as above  #3: COPD. Appears quiescent. Follow clinically.  #4:  Tobacco dependence. Nicotine patch.    Severity of Illness: The appropriate patient status for this patient is INPATIENT. Inpatient status is judged to be reasonable and necessary in order to provide the required intensity of service to ensure the patient's safety. The patient's presenting symptoms, physical exam findings, and initial radiographic and laboratory data in the context of their chronic comorbidities is felt to place them at high risk for further clinical deterioration. Furthermore, it is not anticipated that the patient will be medically stable for discharge from the hospital within 2 midnights of admission. The following factors support the  patient status of inpatient.   " The patient's presenting symptoms include fatigue, decreased energy, bleeding. " The worrisome physical exam findings include gross hematuria. " The initial radiographic and laboratory data are worrisome because of profound anemia. " The chronic co-morbidities include known bladder cancer, COPD.   * I certify that at the point of admission it is my clinical judgment that the patient will require inpatient hospital care spanning beyond 2 midnights from the point of admission due to high intensity of service, high risk for further deterioration and high frequency of surveillance required.*     DVT prophylaxis:SCDs Code Status: full Family Communication: daughter at bedside Consults called: urology, Dr. Noah Delaine will arrange for urology evaluation at [facility]    Time spent: 60 minutes  Murray Hodgkins, MD  [facility] Direct contact: [phone] --Via [place]  --[email or web address]; password [email or web address]  7PM-7AM contact night coverage as above  05/07/2017, 12:49 PM

## 2017-05-07 NOTE — ED Triage Notes (Signed)
Patient states he was called by PCP yesterday and come to ER for low hemoglobin. Patient states he has bladder cancer and has had lot of blood in urine for the past week.

## 2017-05-07 NOTE — ED Provider Notes (Signed)
[facility] DEPT Provider Note   CSN: [id number] Arrival date & time: 05/07/17  0856     History   Chief Complaint Chief Complaint  Patient presents with  . Abnormal Lab    HPI Carl Murphy is a 71 y.o. male.  HPI  The patient is a 71 year old male with a history of prostate cancer, CHF and COPD. 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 He has had multiple noninvasive procedures in the past but when he went for his preop test and had some blood work done he was called and told that he was anemic and needed to come to the emergency department. The patient does note that he has had worsening fatigue and generalized weakness over the last couple of weeks, he has had increased amounts of bleeding in his urine and is now passing frank blood. He is no longer taking aspirin or anti-inflammatory medications. He denies blood in his stool, has no history of coagulopathy, has never had A blood transfusion. The symptoms of been persistent, gradually worsening, they're now become severe. He denies having any syncopal episodes.  Past Medical History:  Diagnosis Date  . Arthritis   . Cancer [facility])    Bladder Cancer  . CHF (congestive heart failure) ([place])   . COPD (chronic obstructive pulmonary disease) ([place])   . Depression   . Hard of hearing    bilat   . Hypertension   . Malignant neoplasm of trigone of bladder ([employer]) 03/25/2016  . PONV (postoperative nausea and vomiting)   . Shortness of breath dyspnea    heat; walking up set of stairs; pt states can walk up 12-14 stairs slowly without having to stop to catch his breath    Patient Active Problem List   Diagnosis Date Noted  . Tobacco abuse 05/15/2016  . Malignant neoplasm of trigone of bladder ([place]) 03/25/2016    Past Surgical History:  Procedure Laterality Date  . CYSTOSCOPY N/A 06/16/2016     Procedure: CYSTOSCOPY;  Surgeon: Franchot Gallo, MD;  Location[facility];  Service: Urology;  Laterality: N/A;  . TRANSURETHRAL RESECTION OF BLADDER TUMOR N/A 03/25/2016   Procedure: TRANSURETHRAL RESECTION OF BLADDER TUMOR (TURBT);  Surgeon: Franchot Gallo, MD;  Location: [facility];  Service: Urology;  Laterality: N/A;  . TRANSURETHRAL RESECTION OF BLADDER TUMOR N/A 06/16/2016   Procedure: TRANSURETHRAL RESECTION OF BLADDER TUMOR (TURBT);  Surgeon: Franchot Gallo, MD;  [facility];  Service: Urology;  Laterality: N/A;  . TRANSURETHRAL RESECTION OF BLADDER TUMOR N/A 12/16/2016   Procedure: TRANSURETHRAL RESECTION OF BLADDER TUMOR (TURBT);  Surgeon: Franchot Gallo, MD;  Location: [facility];  Service: Urology;  Laterality: N/A;       Home Medications    Prior to Admission medications   Medication Sig Start Date End Date Taking? Authorizing Provider  aspirin 325 MG tablet Take 325 mg by mouth daily.    [provider]  buPROPion (WELLBUTRIN XL) 150 MG 24 hr tablet Take 150 mg by mouth daily.  07/07/16   [provider]  carvedilol (COREG) 25 MG tablet Take 25 mg by mouth 2 (two) times daily.  01/31/16   [provider]  cephALEXin (KEFLEX) 500 MG capsule Take 1 capsule (500 mg total) by mouth 2 (two) times daily. 12/16/16   Franchot Gallo, MD  citalopram (CELEXA) 20 MG tablet Take  20 mg by mouth at bedtime.  01/31/16   [provider]  fluticasone (FLONASE) 50 MCG/ACT nasal spray Place 2 sprays into both nostrils daily as needed for allergies or rhinitis.    [provider]  loratadine (CLARITIN) 10 MG tablet Take 10 mg by mouth daily as needed for allergies.    [provider]  losartan-hydrochlorothiazide (HYZAAR) 100-25 MG tablet Take 1 tablet by mouth at bedtime.  01/31/16   [provider]  ondansetron (ZOFRAN) 4 MG tablet Take 1 tablet (4 mg total) by mouth every 8 (eight) hours as needed for nausea or vomiting. 12/16/16    Franchot Gallo, MD  oxyCODONE (OXY IR/ROXICODONE) 5 MG immediate release tablet Take 5 mg by mouth every 6 (six) hours as needed. For pain. 05/05/17   [provider]  Polyethyl Glycol-Propyl Glycol (LUBRICANT EYE DROPS) 0.4-0.3 % SOLN Place 1-2 drops into both eyes 3 (three) times daily as needed (for dry/irritated eyes).    [provider]    Family History Family History  Problem Relation Age of Onset  . Hypertension Mother   . Asthma Father   . Emphysema Father     Social History Social History  Substance Use Topics  . Smoking status: Current Every Day Smoker    Packs/day: 2.00    Years: 50.00    Types: Cigarettes  . Smokeless tobacco: Never Used  . Alcohol use No     Comment: occasionally     Allergies   Patient has no known allergies.   Review of Systems Review of Systems  All other systems reviewed and are negative.    Physical Exam Updated Vital Signs BP (!) 114/28 (BP Location: Right Arm)   Pulse 76   Temp 97.5 F (36.4 C) (Oral)   Resp 20   Ht 5\' 8"  (1.727 m)   Wt 81.6 kg (180 lb)   SpO2 100%   BMI 27.37 kg/m   Physical Exam  Constitutional: He appears well-developed and well-nourished. No distress.  HENT:  Head: Normocephalic and atraumatic.  Mouth/Throat: No oropharyngeal exudate.  Mucous membranes extremely pale  Eyes: EOM are normal. Pupils are equal, round, and reactive to light. Right eye exhibits no discharge. Left eye exhibits no discharge. No scleral icterus.  Pale conjunctiva  Neck: Normal range of motion. Neck supple. No JVD present. No thyromegaly present.  Cardiovascular: Normal rate, regular rhythm, normal heart sounds and intact distal pulses.  Exam reveals no gallop and no friction rub.   No murmur heard. Pulmonary/Chest: Effort normal and breath sounds normal. No respiratory distress. He has no wheezes. He has no rales.  Abdominal: Soft. Bowel sounds are normal. He exhibits no distension and no mass. There is  no tenderness.  Musculoskeletal: Normal range of motion. He exhibits no edema or tenderness.  Lymphadenopathy:    He has no cervical adenopathy.  Neurological: He is alert. Coordination normal.  Normal speech, normal coordination, normal strength in all 4 extremities, normal mental status, normal memory  Skin: Skin is warm and dry. No rash noted. No erythema. There is pallor.  Psychiatric: He has a normal mood and affect. His behavior is normal.  Nursing note and vitals reviewed.    ED Treatments / Results  Labs (all labs ordered are listed, but only abnormal results are displayed) Labs Reviewed  CBC WITH DIFFERENTIAL/PLATELET - Abnormal; Notable for the following:       Result Value   RBC 1.83 (*)    Hemoglobin 5.9 (*)  HCT 18.0 (*)    RDW 18.5 (*)    Lymphs Abs 0.4 (*)    All other components within normal limits  BASIC METABOLIC PANEL - Abnormal; Notable for the following:    Sodium 133 (*)    CO2 20 (*)    Glucose, Bld 103 (*)    BUN 43 (*)    Creatinine, Ser 2.87 (*)    Calcium 8.1 (*)    GFR calc non Af Amer 21 (*)    GFR calc Af Amer 24 (*)    All other components within normal limits  URINALYSIS, ROUTINE W REFLEX MICROSCOPIC  POC OCCULT BLOOD, ED  TYPE AND SCREEN  ABO/RH  PREPARE RBC (CROSSMATCH)     Radiology No results found.  Procedures Procedures (including critical care time)  Medications Ordered in ED Medications  0.9 %  sodium chloride infusion (not administered)  nicotine (NICODERM CQ - dosed in mg/24 hours) patch 21 mg (not administered)     Initial Impression / Assessment and Plan / ED Course  I have reviewed the triage vital signs and the nursing notes.  Pertinent labs & imaging results that were available during my care of the patient were reviewed by me and considered in my medical decision making (see chart for details).     The patient has multiple lab abnormalities including severe anemia which is at 5.9, also has acute on  chronic renal failure, will give IV fluids as well as type and screen and actively transfused in the emergency department. The patient does have severe anemia and requires acute intervention. He is critically ill with severe anemia. I discussed with the patient the risks benefits and alternatives of blood transfusion and he has requested to proceed with transfusion. We'll discuss with the hospitalist for admission.  D/w Dr. Sarajane Jews who will admit  CRITICAL CARE Performed by: Johnna Acosta Total critical care time: 35 minutes Critical care time was exclusive of separately billable procedures and treating other patients. Critical care was necessary to treat or prevent imminent or life-threatening deterioration. Critical care was time spent personally by me on the following activities: development of treatment plan with patient and/or surrogate as well as nursing, discussions with consultants, evaluation of patient's response to treatment, examination of patient, obtaining history from patient or surrogate, ordering and performing treatments and interventions, ordering and review of laboratory studies, ordering and review of radiographic studies, pulse oximetry and re-evaluation of patient's condition.   Final Clinical Impressions(s) / ED Diagnoses   Final diagnoses:  Severe anemia  Gross hematuria  AKI (acute kidney injury) ([place])    New Prescriptions New Prescriptions   No medications on file     Noemi Chapel, MD 05/07/17 1149

## 2017-05-07 NOTE — Consult Note (Signed)
Urology Consult  Referring physician: Dr. Sarajane Murphy Reason for referral: gross hematuria, hx of bladder cancer  Chief Complaint: gross hematuria  History of Present Illness: [name] is a 71yo with a hx of muscle invasive bladder cancer treated with carbo/XRT who developed gross hematuria which has worsened over the past month. He Had tumor recurrence after XRT and carbo but it was not muscle invasive. His last resection was in 12/2016. He was seen by Dr. Diona Murphy on 05/05/2017 with worsening gross hematuria and is scheduled for TURBT on 05/12/2017. He had a hemoglobin of 6.6 on 05/05/2017 and was instructed to go to the ER, but the patient deferred. He presented to the ER today with worsening hematuria and was found to have a hemoglobin of 5.9. He denies any suprapubic pain. He has an indwelling foley draining gross blood.  He has known right hydronephrosis due to a distal ureteral tumor.   Past Medical History:  Diagnosis Date  . Arthritis   . Cancer [facility])    Bladder Cancer  . CHF (congestive heart failure) ([place])   . COPD (chronic obstructive pulmonary disease) ([place])   . Depression   . Hard of hearing    bilat   . Hypertension   . Malignant neoplasm of trigone of bladder ([place]) 03/25/2016  . PONV (postoperative nausea and vomiting)   . Shortness of breath dyspnea    heat; walking up set of stairs; pt states can walk up 12-14 stairs slowly without having to stop to catch his breath   Past Surgical History:  Procedure Laterality Date  . CYSTOSCOPY N/A 06/16/2016   Procedure: CYSTOSCOPY;  Surgeon: Carl Gallo, MD;  Location: [facility];  Service: Urology;  Laterality: N/A;  . TRANSURETHRAL RESECTION OF BLADDER TUMOR N/A 03/25/2016   Procedure: TRANSURETHRAL RESECTION OF BLADDER TUMOR (TURBT);  Surgeon: Carl Gallo, MD;  Location: [facility];  Service: Urology;  Laterality: N/A;  . TRANSURETHRAL RESECTION OF BLADDER TUMOR N/A 06/16/2016   Procedure: TRANSURETHRAL RESECTION OF BLADDER TUMOR  (TURBT);  Surgeon: Carl Gallo, MD;  Location: [facility];  Service: Urology;  Laterality: N/A;  . TRANSURETHRAL RESECTION OF BLADDER TUMOR N/A 12/16/2016   Procedure: TRANSURETHRAL RESECTION OF BLADDER TUMOR (TURBT);  Surgeon: Carl Gallo, MD;  Location: [facility];  Service: Urology;  Laterality: N/A;    Medications: I have reviewed the patient's current medications. Allergies: No Known Allergies  Family History  Problem Relation Age of Onset  . Hypertension Mother   . Asthma Father   . Emphysema Father    Social History:  reports that he has been smoking Cigarettes.  He has a 100.00 pack-year smoking history. He has never used smokeless tobacco. He reports that he does not drink alcohol or use drugs.  Review of Systems  Genitourinary: Positive for flank pain and hematuria.  Neurological: Positive for weakness.  All other systems reviewed and are negative.   Physical Exam:  Vital signs in last 24 hours: Temp:  [97.5 F (36.4 C)-98.2 F (36.8 C)] 97.7 F (36.5 C) (06/07 2044) Pulse Rate:  [68-93] 86 (06/07 2044) Resp:  [13-20] 18 (06/07 2044) BP: (114-136)/(28-91) 132/91 (06/07 2044) SpO2:  [95 %-100 %] 100 % (06/07 2044) Weight:  [81.6 kg (180 lb)] 81.6 kg (180 lb) (06/07 0906) Physical Exam  Constitutional: He is oriented to person, place, and time. He appears well-developed and well-nourished.  HENT:  Head: Normocephalic and atraumatic.  Eyes: EOM are normal. Pupils are equal, round, and reactive to light.  Neck: Normal range  of motion. No thyromegaly present.  Cardiovascular: Normal rate and regular rhythm.   Respiratory: Effort normal. No respiratory distress.  GI: Soft. He exhibits no distension. Hernia confirmed negative in the right inguinal area and confirmed negative in the left inguinal area.  Genitourinary: Testes normal and penis normal.  Musculoskeletal: Normal range of motion. He exhibits no edema.  Lymphadenopathy:       Right: No inguinal adenopathy  present.       Left: No inguinal adenopathy present.  Neurological: He is alert and oriented to person, place, and time.  Skin: Skin is warm and dry.  Psychiatric: He has a normal mood and affect. His behavior is normal. Judgment and thought content normal.    Laboratory Data:  Results for orders placed or performed during the hospital encounter of 05/07/17 (from the past 72 hour(s))  CBC with Differential     Status: Abnormal   Collection Time: 05/07/17 10:12 AM  Result Value Ref Range   WBC 6.5 4.0 - 10.5 K/uL   RBC 1.83 (L) 4.22 - 5.81 MIL/uL   Hemoglobin 5.9 (LL) 13.0 - 17.0 g/dL    Comment: REPEATED TO VERIFY CRITICAL RESULT CALLED TO, READ BACK BY AND VERIFIED WITH: M CRUZ,RN AT 10:48 ON [id number] BY JTORTORICI    HCT 18.0 (L) 39.0 - 52.0 %   MCV 98.4 78.0 - 100.0 fL   MCH 32.2 26.0 - 34.0 pg   MCHC 32.8 30.0 - 36.0 g/dL   RDW 18.5 (H) 11.5 - 15.5 %   Platelets 346 150 - 400 K/uL   Neutrophils Relative % 86 %   Neutro Abs 5.6 1.7 - 7.7 K/uL   Lymphocytes Relative 6 %   Lymphs Abs 0.4 (L) 0.7 - 4.0 K/uL   Monocytes Relative 7 %   Monocytes Absolute 0.4 0.1 - 1.0 K/uL   Eosinophils Relative 1 %   Eosinophils Absolute 0.1 0.0 - 0.7 K/uL   Basophils Relative 0 %   Basophils Absolute 0.0 0.0 - 0.1 K/uL  Basic metabolic panel     Status: Abnormal   Collection Time: 05/07/17 10:12 AM  Result Value Ref Range   Sodium 133 (L) 135 - 145 mmol/L   Potassium 4.8 3.5 - 5.1 mmol/L   Chloride 102 101 - 111 mmol/L   CO2 20 (L) 22 - 32 mmol/L   Glucose, Bld 103 (H) 65 - 99 mg/dL   BUN 43 (H) 6 - 20 mg/dL   Creatinine, Ser 2.87 (H) 0.61 - 1.24 mg/dL   Calcium 8.1 (L) 8.9 - 10.3 mg/dL   GFR calc non Af Amer 21 (L) >60 mL/min   GFR calc Af Amer 24 (L) >60 mL/min    Comment: (NOTE) The eGFR has been calculated using the CKD EPI equation. This calculation has not been validated in all clinical situations. eGFR's persistently <60 mL/min signify possible Chronic Kidney Disease.     Anion gap 11 5 - 15  Type and screen [facility]     Status: None (Preliminary result)   Collection Time: 05/07/17 10:18 AM  Result Value Ref Range   ABO/RH(D) O POS    Antibody Screen NEG    Sample Expiration 05/10/2017    Unit Number [id number]    Blood Component Type RED CELLS,LR    Unit division 00    Status of Unit ALLOCATED    Transfusion Status OK TO TRANSFUSE    Crossmatch Result Compatible    Unit Number [id number]    Blood  Component Type RED CELLS,LR    Unit division 00    Status of Unit ISSUED    Transfusion Status OK TO TRANSFUSE    Crossmatch Result Compatible   ABO/Rh     Status: None   Collection Time: 05/07/17 10:18 AM  Result Value Ref Range   ABO/RH(D) O POS   Prepare RBC     Status: None   Collection Time: 05/07/17 11:30 AM  Result Value Ref Range   Order Confirmation ORDER PROCESSED BY BLOOD BANK   Prepare RBC     Status: None   Collection Time: 05/07/17  7:02 PM  Result Value Ref Range   Order Confirmation ORDER PROCESSED BY BLOOD BANK   Type and screen [facility]     Status: None (Preliminary result)   Collection Time: 05/07/17  7:02 PM  Result Value Ref Range   ABO/RH(D) O POS    Antibody Screen NEG    Sample Expiration 05/10/2017    Unit Number [id number]    Blood Component Type RBC LR PHER1    Unit division 00    Status of Unit ALLOCATED    Transfusion Status OK TO TRANSFUSE    Crossmatch Result Compatible    Unit Number [id number]    Blood Component Type RBC LR PHER2    Unit division 00    Status of Unit ALLOCATED    Transfusion Status OK TO TRANSFUSE    Crossmatch Result Compatible    Unit Number [id number]    Blood Component Type RED CELLS,LR    Unit division 00    Status of Unit ALLOCATED    Transfusion Status OK TO TRANSFUSE    Crossmatch Result Compatible   ABO/Rh     Status: None (Preliminary result)   Collection Time: 05/07/17  7:02 PM  Result Value Ref Range   ABO/RH(D) O POS     Recent Results (from the past 240 hour(s))  Culture, Urine     Status: None   Collection Time: 05/05/17  5:00 PM  Result Value Ref Range Status   Specimen Description URINE, RANDOM  Final   Special Requests NONE  Final   Culture   Final    NO GROWTH Performed at [facility], [address], [place], [place] [address]    Report Status 05/07/2017 FINAL  Final   Creatinine:  Recent Labs  05/07/17 1012  CREATININE 2.87*   Baseline Creatinine: 1.2  Impression/Assessment:  71yo with bladder cancer, right hydronephrosis, gross hematuria and acute renal failure.   Plan:  1. Gross hematuria: I discussed the cuases of gross heamturia with the patient and family and discussed that this is likely related to his bladder cancer. I also discussed the need for transfusion based on his hemoglobin of 5.9. His family voiced concern with his continued bleeding and the need to change the foley 3 times since his presentation to the ER. We will have a 22 french foley placed and the patient will be started of continuous bladder irrigation. We will then wean the CBI as needed. I believe the patient can keep his scheduled surgery on 05/12/2017 pending stabilization of his hemoglobin.  2. Acute renal failure with history of right hydronephrosis: please obtain renal US to assess for bilateral hydronephrosis.  Nicolette Bang 05/07/2017, 9:57 PM

## 2017-05-08 ENCOUNTER — Inpatient Hospital Stay (HOSPITAL_COMMUNITY): Payer: Medicare Other

## 2017-05-08 ENCOUNTER — Encounter: Payer: Self-pay | Admitting: Oncology

## 2017-05-08 ENCOUNTER — Encounter (HOSPITAL_COMMUNITY): Admission: RE | Admit: 2017-05-08 | Payer: Medicare Other | Source: Ambulatory Visit

## 2017-05-08 DIAGNOSIS — J449 Chronic obstructive pulmonary disease, unspecified: Secondary | ICD-10-CM

## 2017-05-08 DIAGNOSIS — F17200 Nicotine dependence, unspecified, uncomplicated: Secondary | ICD-10-CM

## 2017-05-08 DIAGNOSIS — N179 Acute kidney failure, unspecified: Secondary | ICD-10-CM

## 2017-05-08 DIAGNOSIS — D649 Anemia, unspecified: Secondary | ICD-10-CM

## 2017-05-08 DIAGNOSIS — D62 Acute posthemorrhagic anemia: Secondary | ICD-10-CM

## 2017-05-08 LAB — BASIC METABOLIC PANEL
Anion gap: 6 (ref 5–15)
BUN: 35 mg/dL — AB (ref 6–20)
CHLORIDE: 108 mmol/L (ref 101–111)
CO2: 23 mmol/L (ref 22–32)
Calcium: 7.7 mg/dL — ABNORMAL LOW (ref 8.9–10.3)
Creatinine, Ser: 2.26 mg/dL — ABNORMAL HIGH (ref 0.61–1.24)
GFR calc Af Amer: 32 mL/min — ABNORMAL LOW (ref >60)
GFR calc non Af Amer: 28 mL/min — ABNORMAL LOW (ref >60)
GLUCOSE: 90 mg/dL (ref 65–99)
POTASSIUM: 4.2 mmol/L (ref 3.5–5.1)
Sodium: 137 mmol/L (ref 135–145)

## 2017-05-08 LAB — CBC
HCT: 22.4 % — ABNORMAL LOW (ref 39.0–52.0)
HEMOGLOBIN: 7.6 g/dL — AB (ref 13.0–17.0)
MCH: 31.7 pg (ref 26.0–34.0)
MCHC: 33.9 g/dL (ref 30.0–36.0)
MCV: 93.3 fL (ref 78.0–100.0)
Platelets: 263 10*3/uL (ref 150–400)
RBC: 2.4 MIL/uL — AB (ref 4.22–5.81)
RDW: 18.6 % — ABNORMAL HIGH (ref 11.5–15.5)
WBC: 4.1 10*3/uL (ref 4.0–10.5)

## 2017-05-08 MED ORDER — BELLADONNA ALKALOIDS-OPIUM 16.2-60 MG RE SUPP
1.0000 | Freq: Four times a day (QID) | RECTAL | Status: DC | PRN
  Administered 2017-05-08: 1 via RECTAL
  Filled 2017-05-08 (×2): qty 1

## 2017-05-08 MED ORDER — IPRATROPIUM-ALBUTEROL 0.5-2.5 (3) MG/3ML IN SOLN: 3.0000 mL | Freq: Four times a day (QID) | RESPIRATORY_TRACT | Status: DC | PRN

## 2017-05-08 MED ORDER — LIDOCAINE HCL 2 % EX GEL
1.0000 "application " | Freq: Once | CUTANEOUS | Status: DC
  Filled 2017-05-08: qty 5

## 2017-05-08 MED ORDER — ZOLPIDEM TARTRATE 5 MG PO TABS: 5.0000 mg | ORAL_TABLET | Freq: Every evening | ORAL | Status: DC | PRN

## 2017-05-08 MED ORDER — BELLADONNA ALKALOIDS-OPIUM 16.2-60 MG RE SUPP: 1.0000 | Freq: Four times a day (QID) | RECTAL | Status: DC | PRN

## 2017-05-08 MED ORDER — MORPHINE SULFATE (PF) 2 MG/ML IV SOLN
1.0000 mg | INTRAVENOUS | Status: DC | PRN
  Administered 2017-05-08 – 2017-05-12 (×5): 1 mg via INTRAVENOUS
  Filled 2017-05-08 (×7): qty 1

## 2017-05-08 MED ORDER — BUPROPION HCL ER (XL) 150 MG PO TB24
150.0000 mg | ORAL_TABLET | Freq: Every day | ORAL | Status: DC
  Administered 2017-05-08 – 2017-05-12 (×5): 150 mg via ORAL
  Filled 2017-05-08 (×5): qty 1

## 2017-05-08 MED ORDER — LIDOCAINE HCL 2 % EX GEL
1.0000 "application " | Freq: Once | CUTANEOUS | Status: AC
  Administered 2017-05-08: 1 via URETHRAL
  Filled 2017-05-08: qty 11

## 2017-05-08 MED ORDER — OXYCODONE HCL 5 MG PO TABS
5.0000 mg | ORAL_TABLET | ORAL | Status: DC | PRN
  Administered 2017-05-08 – 2017-05-12 (×14): 5 mg via ORAL
  Filled 2017-05-08 (×14): qty 1

## 2017-05-08 MED ORDER — SODIUM CHLORIDE 0.9 % IV SOLN: Freq: Once | INTRAVENOUS | Status: AC

## 2017-05-08 MED ORDER — NICOTINE 21 MG/24HR TD PT24
21.0000 mg | MEDICATED_PATCH | Freq: Once | TRANSDERMAL | Status: DC
  Administered 2017-05-08: 21 mg via TRANSDERMAL
  Filled 2017-05-08: qty 1

## 2017-05-08 MED ORDER — ENSURE ENLIVE PO LIQD: 237.0000 mL | Freq: Two times a day (BID) | ORAL | Status: DC

## 2017-05-08 MED ORDER — OXYBUTYNIN CHLORIDE 5 MG PO TABS
5.0000 mg | ORAL_TABLET | Freq: Four times a day (QID) | ORAL | Status: DC | PRN
  Administered 2017-05-08 – 2017-05-11 (×8): 5 mg via ORAL
  Filled 2017-05-08 (×8): qty 1

## 2017-05-08 NOTE — Progress Notes (Addendum)
PROGRESS NOTE    Carl Murphy  OMV:[id number] DOB: Jun 12, 1946 DOA: 05/07/2017 PCP: [name], MD    Brief Narrative:  71 year old male who presented to the hospital with a chief complaint of hematuria. Patient known to have bladder cancer, worsening symptoms over last 5 days prior to hospitalization. June 5 received Foley catheter due to urinary obstruction, blood work showed significant anemia, he was referred to the ED for further evaluation and treatment. On initial physical examination, hemodynamically stable, no pallor or icterus, lungs were clear to auscultation, heart S1-S2 present rhythmic, soft abdomen, no lower extremity edema. Sodium 133, potassium 4.8, chloride 102, bicarbonate 20, glucose of 103, BUN 43, creatinine 2.87, white count 6.5, hemoglobin 5.9, hematocrit 18, platelets 346. Patient was admitted with working diagnosis of acute blood loss anemia due to hematuria.   Assessment & Plan:   Principal Problem:   Acute blood loss anemia Active Problems:   Malignant neoplasm of trigone of bladder (HCC)   Gross hematuria   AKI (acute kidney injury) (H[facility])   COPD (chronic obstructive pulmonary disease) (H[facility])   Tobacco dependence   1. Acute blood loss anemia due to hematuria. Patient continue bleeding, with frank hematuria, will continue bladder irrigation, hb up to 7,6 from 5,9, sp 2 units of blood transfusion (prbc), will order other 2 units PRBC, to a total of 4 units since admission. Follow cell count. Platelets at 263, will check INR. Will add oxybutynin for bladder spasms.   2. AKI on CKD stage 3 (base cr 1,5 with calculated GFR 42) Renal function with cr down to 2,2, from 2,8, will continue IV fluids, blood transfusion and bladder irrigation, likely combination of pre-renal and post renal. Will follow on renal US.   3. COPD. Stable and chronic with no signs of acute exacerbation, will continue close monitor of symptoms, avoid volume overload.   4. Tobacco abuse. Smoking  cessation.    5. HTN. Continue on coreg.   6. Depression. Will continue on citalopram, bupropion and trazodone.   Documented Heart failure, stable and chronic, suspected diastolic, no echocardiogram on record. Old records personally reviewed.    DVT prophylaxis: SCD Code Status: Full  Family Communication: I spoke with patient's family at the bedside and all questions were addressed.  Disposition Plan: Home   Consultants:   Urology   Procedures:    Antimicrobials:     Subjective: Patient feeling better, no nausea or vomiting, no chest pain, dyspnea and level of energy improving. On continuous bladder irrigation, continue hematuria, gross.   Objective: Vitals:   05/07/17 2259 05/08/17 0115 05/08/17 0145 05/08/17 0459  BP: 117/63 96/65 96/65  95/60  Pulse: 85 71 71 66  Resp: 14 14 14 16   Temp: 98.5 F (36.9 C) 97.7 F (36.5 C) 97.7 F (36.5 C) 97.5 F (36.4 C)  TempSrc: Oral Oral Oral Oral  SpO2: 96% 96% 96% 96%  Weight:      Height:        Intake/Output Summary (Last 24 hours) at 05/08/17 0850 Last data filed at 05/08/17 0756  Gross per 24 hour  Intake            13129 ml  Output             8700 ml  Net             4429 ml   Filed Weights   05/07/17 0906  Weight: 81.6 kg (180 lb)    Examination:  General exam: deconditioned E ENT:  positive pallor, oral mucosa moist, no icterus.  Respiratory system: Clear to auscultation. Respiratory effort normal. No wheezing, rales or rhonchi.  Cardiovascular system: S1 & S2 heard, RRR. No JVD, murmurs, rubs, gallops or clicks. No pedal edema. Gastrointestinal system: Abdomen is nondistended, soft and nontender. No organomegaly or masses felt. Normal bowel sounds heard.  Central nervous system: Alert and oriented. No focal neurological deficits. Extremities: Symmetric 5 x 5 power. Skin: No rashes, lesions or ulcers     Data Reviewed: I have personally reviewed following labs and imaging studies  CBC:  Recent  Labs Lab 05/07/17 1012 05/08/17 0548  WBC 6.5 4.1  NEUTROABS 5.6  --   HGB 5.9* 7.6*  HCT 18.0* 22.4*  MCV 98.4 93.3  PLT 346 588   Basic Metabolic Panel:  Recent Labs Lab 05/07/17 1012 05/08/17 0548  NA 133* 137  K 4.8 4.2  CL 102 108  CO2 20* 23  GLUCOSE 103* 90  BUN 43* 35*  CREATININE 2.87* 2.26*  CALCIUM 8.1* 7.7*   GFR: Estimated Creatinine Clearance: 29.8 mL/min (A) (by C-G formula based on SCr of 2.26 mg/dL (H)). Liver Function Tests: No results for input(s): AST, ALT, ALKPHOS, BILITOT, PROT, ALBUMIN in the last 168 hours. No results for input(s): LIPASE, AMYLASE in the last 168 hours. No results for input(s): AMMONIA in the last 168 hours. Coagulation Profile: No results for input(s): INR, PROTIME in the last 168 hours. Cardiac Enzymes: No results for input(s): CKTOTAL, CKMB, CKMBINDEX, TROPONINI in the last 168 hours. BNP (last 3 results) No results for input(s): PROBNP in the last 8760 hours. HbA1C: No results for input(s): HGBA1C in the last 72 hours. CBG: No results for input(s): GLUCAP in the last 168 hours. Lipid Profile: No results for input(s): CHOL, HDL, LDLCALC, TRIG, CHOLHDL, LDLDIRECT in the last 72 hours. Thyroid Function Tests: No results for input(s): TSH, T4TOTAL, FREET4, T3FREE, THYROIDAB in the last 72 hours. Anemia Panel: No results for input(s): VITAMINB12, FOLATE, FERRITIN, TIBC, IRON, RETICCTPCT in the last 72 hours. Sepsis Labs: No results for input(s): PROCALCITON, LATICACIDVEN in the last 168 hours.  Recent Results (from the past 240 hour(s))  Culture, Urine     Status: None   Collection Time: 05/05/17  5:00 PM  Result Value Ref Range Status   Specimen Description URINE, RANDOM  Final   Special Requests NONE  Final   Culture   Final    NO GROWTH Performed at [facility], [address] [place], [place] [address]    Report Status 05/07/2017 FINAL  Final         Radiology Studies: No results  found.      Scheduled Meds: . carvedilol  25 mg Oral BID  . citalopram  20 mg Oral QHS  . nicotine  21 mg Transdermal Once  . nicotine  21 mg Transdermal Once   Continuous Infusions: . sodium chloride 75 mL/hr (05/07/17 2218)  . sodium chloride       LOS: 1 day     Tawni Millers, MD [facility] Pager [phone]  If 7PM-7AM, please contact night-coverage [email or web address] Password [email or web address] 05/08/2017, 8:50 AM

## 2017-05-08 NOTE — Progress Notes (Signed)
Late entry note:  For 05/07/17 at 0130:   Patient Had a previous foley 16 F.  But could not find documentation of it in order to chart as discontinued and removed before applying  new [address] CBI 22 F foley in.  But it was deflated and removed without any difficulty or pain to patient at above time.

## 2017-05-08 NOTE — Progress Notes (Signed)
  Subjective: Patient reports still having bladder spasms  Objective: Vital signs in last 24 hours: Temp:  [97.5 F (36.4 C)-98.5 F (36.9 C)] 97.5 F (36.4 C) (06/08 0459) Pulse Rate:  [66-93] 66 (06/08 0459) Resp:  [13-20] 16 (06/08 0459) BP: (95-136)/(28-91) 95/60 (06/08 0459) SpO2:  [95 %-100 %] 96 % (06/08 0459) Weight:  [81.6 kg (180 lb)] 81.6 kg (180 lb) (06/07 0906)  Intake/Output from previous day: 06/07 0701 - 06/08 0700 In: 13129 [I.V.:440; Blood:689] Out: 6700 [Urine:6700] Intake/Output this shift: Total I/O In: 54008 [I.V.:120; Blood:354; Other:12000] Out: 6000 [Urine:6000]  Physical Exam:  Constitutional: Vital signs reviewed. WD WN in NAD   Eyes: PERRL, No scleral icterus.   Cardiovascular: RRR Extremities: No cyanosis or edema   Lab Results:  Recent Labs  05/07/17 1012 05/08/17 0548  HGB 5.9* 7.6*  HCT 18.0* 22.4*   BMET  Recent Labs  05/07/17 1012 05/08/17 0548  NA 133* 137  K 4.8 4.2  CL 102 108  CO2 20* 23  GLUCOSE 103* 90  BUN 43* 35*  CREATININE 2.87* 2.26*  CALCIUM 8.1* 7.7*   No results for input(s): LABPT, INR in the last 72 hours. No results for input(s): LABURIN in the last 72 hours. Results for orders placed or performed during the hospital encounter of 05/05/17  Culture, Urine     Status: None   Collection Time: 05/05/17  5:00 PM  Result Value Ref Range Status   Specimen Description URINE, RANDOM  Final   Special Requests NONE  Final   Culture   Final    NO GROWTH Performed at [facility], [place] [address] [place], [place] [address]    Report Status 05/07/2017 FINAL  Final   I tried irrigating bladder.  A few clots were liberated, but I feel this catheter needs to be switched out to a stronger hematuria catheter.  Studies/Results: No results found.  Assessment/Plan:   Bladder cancer with gross hematuria, probably still with clot and bladder.  This needs to be evacuated with irrigation through a different  catheter.  I've ordered a 24 [place] hematuria catheter to be placed.  We will attempt irrigation later on today.  Once clot is adequately irrigated from his bladder, I feel more than likely the bleeding will resolve.  He will obviously need more blood during his hospitalization.  We will continue to follow.  I will move him out of the fourth floor to make it easier for his catheter management.  I'm okay with discharge over the weekend if bleeding decreases, his hemoglobin is adequately restored.   LOS: 1 day   Franchot Gallo M 05/08/2017, 6:27 AM

## 2017-05-08 NOTE — Progress Notes (Signed)
Awaiting charge nurse from [place] to assist with 3-way foley exchange. Handed off to Shanon Brow, Therapist, sports. Student-PA in to see pt and exchanged foley over to 24 FR 3-way foley per order. CBI in remains in progress with medium cherry-colored urine noted.

## 2017-05-08 NOTE — Care Management Note (Signed)
Case Management Note  Patient Details  Name: Carl Murphy MRN: [id number] Date of Birth: 12-12-1945  Subjective/Objective:                  71 year old man with history of bladder cancer, followed by Dr. Eulogio Ditch, with ongoing hematuria, which has worsened. Recent blood work revealed profound anemia and he was referred to the emergency department for further evaluation where severe anemia was confirmed. Plans for blood transfusion and transfer to [facility] long for urology intervention.  Patient reports he's had ongoing mild hematuria for some time, however which worsened throughout the weekend. He was seen in the office 6/5 by urology at which time he was having obstructive symptoms and a Foley catheter was placed with some relief pain. He has blood work drawn at that time with results available 6/6. He was advised to get the emergency department yesterday but declined to do so. No specific aggravating factors. Stop taking aspirin a week ago. Associated fatigue and loss of energy.  Besides gross hematuria he has no other complaints. Per the emergency department physician he did have some obstruction noted in Foley catheter was changed in the emergency department.  ED Course: Afebrile, vital stable. Foley catheter change. Typed and crossed and transfusion started   Action/Plan: Date:  May 08, 2017  Chart reviewed for concurrent status and case management needs.  Will continue to follow patient progress.  Discharge Planning: following for needs  Expected discharge date: [id number]  Velva Harman, BSN, [place], [place]   Expected Discharge Date:                  Expected Discharge Plan:  Home/Self Care  In-House Referral:     Discharge planning Services  CM Consult  Post Acute Care Choice:    Choice offered to:     DME Arranged:    DME Agency:     HH Arranged:    HH Agency:     Status of Service:  In process, will continue to follow  If discussed at Long Length of Stay  Meetings, dates discussed:    Additional Comments:  [name], RN 05/08/2017, 9:05 AM

## 2017-05-08 NOTE — Progress Notes (Signed)
Carl Murphy [phone]. Received 2 units PRBC.  Hgb this am is 7.6. Has CBI running, turning pink, but occ blood red tinge. Needs more pain medicine.  Thanks.

## 2017-05-08 NOTE — Progress Notes (Signed)
Patient transferred from [place], [facility], agree with previous RN assessment. Denies any needs at this time will continue to monitor.

## 2017-05-08 NOTE — Progress Notes (Signed)
PROGRESS NOTE [facility]   Carl Murphy   ZOX:[id number] DOB: [birth year]  DOA: 05/07/2017 PCP: Carl Noble, MD   I saw and evaluated the patient, discuss the case with [facility] PA-S, I agree with his history, exam and medical decision making, please see my progress notes for further details.   Carl Murphy M.D    Brief Narrative:  71 year old male with significant Hx of bladder cancer, followed by Dr. Eulogio Murphy, with continued hematuria x7 days, which has recently worsened. Patient reported to [facility] ED with severe anemia where he received 1 unit of blood and was then transferred to [facility] where he received another unit of blood. Upon admission patient had associated fatigue/decreased energy. No fever, Sob, chest pain, N/V, abdominal pain. No other complaints. Vitals on admission: 118/61, resp 16, 100 % ion room air.   Patient is schedule for surgery in [place] on 6/12.    Subjective: Patient states he is feeling much better. Has more energy today. Having occasional bladder spasms which cause 9/10 Lower quadrant pain. Still having gross hematuria, but catheter has helped with the pain. Patient has passed some clots. Denies N/V.   Assessment & Plan:   Principal Problem:   Acute blood loss anemia Active Problems:   Malignant neoplasm of trigone of bladder (HCC)   Gross hematuria   AKI (acute kidney injury) (HCC)   COPD (chronic obstructive pulmonary disease) (HCC)   Tobacco dependence   Acute blood loss anemia, secondary to gross hematuria, presumably related to known bladder cancer Hemodynamics are stable Patient no longer symptomatic Transfuse 2 more units of packed red blood cells  Serial CBC Urology already consulted: 22 french foley placed with continuous bladder irrigation. Wean CBI as needed Patient can keep scheduled surgery on 6/12 pending stabilization of hemoglobin  Acute kidney injury with Hx of right hydronephrosis post renal secondary  to obstructive hematuria with clots. Transfuse blood, and then IV fluids, serial BMP Urology consult: renal US to assess bilateral hydronephrosis Labs trending in appropriate direction  COPD 100 pack year Hx Nebs prn q6h  Tobacco dependence Nicotine patch Wellbutrin   DVT prophylaxis: SCDs Code Status: full Family Communication: son and daughter at bedside Disposition Plan: Home   Consultants:   Urology   Procedures:   Antimicrobials:  None   Objective: Vitals:   05/07/17 2259 05/08/17 0115 05/08/17 0145 05/08/17 0459  BP: 117/63 96/65 96/65  95/60  Pulse: 85 71 71 66  Resp: 14 14 14 16   Temp: 98.5 F (36.9 C) 97.7 F (36.5 C) 97.7 F (36.5 C) 97.5 F (36.4 C)  TempSrc: Oral Oral Oral Oral  SpO2: 96% 96% 96% 96%  Weight:      Height:        Intake/Output Summary (Last 24 hours) at 05/08/17 1128 Last data filed at 05/08/17 0756  Gross per 24 hour  Intake            13129 ml  Output             8700 ml  Net             4429 ml   Filed Weights   05/07/17 0906  Weight: 180 lb (81.6 kg)    Examination:  General exam: Appears pale, deconditioned, in no acute distress Respiratory system: Diffuse expiratory wheeze, no rales or rhonchi Cardiovascular system: S1 & S2 heard, RRR. No JVD, murmurs, rubs or gallops Gastrointestinal system: Abdomen is nondistended, soft and nontender. No organomegaly or masses felt.  Normal bowel sounds heard. GU: Gross hematuria seen in foley bag Central nervous system: Alert and oriented. No focal neurological deficits. Extremities: No pedal edema Skin: No rashes, lesions or ulcers Psychiatry: Judgement and insight appear normal. Mood & affect appropriate.    Data Reviewed: I have personally reviewed following labs and imaging studies  CBC:  Recent Labs Lab 05/07/17 1012 05/08/17 0548  WBC 6.5 4.1  NEUTROABS 5.6  --   HGB 5.9* 7.6*  HCT 18.0* 22.4*  MCV 98.4 93.3  PLT 346 364   Basic Metabolic  Panel:  Recent Labs Lab 05/07/17 1012 05/08/17 0548  NA 133* 137  K 4.8 4.2  CL 102 108  CO2 20* 23  GLUCOSE 103* 90  BUN 43* 35*  CREATININE 2.87* 2.26*  CALCIUM 8.1* 7.7*   GFR: Estimated Creatinine Clearance: 29.8 mL/min (A) (by C-G formula based on SCr of 2.26 mg/dL (H)). Liver Function Tests: No results for input(s): AST, ALT, ALKPHOS, BILITOT, PROT, ALBUMIN in the last 168 hours. No results for input(s): LIPASE, AMYLASE in the last 168 hours. No results for input(s): AMMONIA in the last 168 hours. Coagulation Profile: No results for input(s): INR, PROTIME in the last 168 hours. Cardiac Enzymes: No results for input(s): CKTOTAL, CKMB, CKMBINDEX, TROPONINI in the last 168 hours. BNP (last 3 results) No results for input(s): PROBNP in the last 8760 hours. HbA1C: No results for input(s): HGBA1C in the last 72 hours. CBG: No results for input(s): GLUCAP in the last 168 hours. Lipid Profile: No results for input(s): CHOL, HDL, LDLCALC, TRIG, CHOLHDL, LDLDIRECT in the last 72 hours. Thyroid Function Tests: No results for input(s): TSH, T4TOTAL, FREET4, T3FREE, THYROIDAB in the last 72 hours. Anemia Panel: No results for input(s): VITAMINB12, FOLATE, FERRITIN, TIBC, IRON, RETICCTPCT in the last 72 hours. Sepsis Labs: No results for input(s): PROCALCITON, LATICACIDVEN in the last 168 hours.  Recent Results (from the past 240 hour(s))  Culture, Urine     Status: None   Collection Time: 05/05/17  5:00 PM  Result Value Ref Range Status   Specimen Description URINE, RANDOM  Final   Special Requests NONE  Final   Culture   Final    NO GROWTH Performed at [facility], [address] [place] [address], [place], [place] [address]    Report Status 05/07/2017 FINAL  Final         Radiology Studies: No results found.    Scheduled Meds: . buPROPion  150 mg Oral Daily  . carvedilol  25 mg Oral BID  . citalopram  20 mg Oral QHS  . lidocaine  1 application Urethral Once  .  nicotine  21 mg Transdermal Once  . nicotine  21 mg Transdermal Once   Continuous Infusions: . sodium chloride 75 mL/hr at 05/08/17 0914  . sodium chloride       LOS: 1 day    Carl Plocki, Pa-s  If 7PM-7AM, please contact night-coverage [email or web address] Password [email or web address] 05/08/2017, 11:28 AM

## 2017-05-09 ENCOUNTER — Inpatient Hospital Stay (HOSPITAL_COMMUNITY): Payer: Medicare Other

## 2017-05-09 DIAGNOSIS — C670 Malignant neoplasm of trigone of bladder: Principal | ICD-10-CM

## 2017-05-09 LAB — TYPE AND SCREEN
ABO/RH(D): O POS
Antibody Screen: NEGATIVE
UNIT DIVISION: 0
Unit division: 0
Unit division: 0

## 2017-05-09 LAB — CBC WITH DIFFERENTIAL/PLATELET
BASOS ABS: 0 10*3/uL (ref 0.0–0.1)
Basophils Relative: 0 %
EOS PCT: 1 %
Eosinophils Absolute: 0.1 10*3/uL (ref 0.0–0.7)
HEMATOCRIT: 30.5 % — AB (ref 39.0–52.0)
Hemoglobin: 10.1 g/dL — ABNORMAL LOW (ref 13.0–17.0)
Lymphocytes Relative: 11 %
Lymphs Abs: 0.7 10*3/uL (ref 0.7–4.0)
MCH: 30.5 pg (ref 26.0–34.0)
MCHC: 33.1 g/dL (ref 30.0–36.0)
MCV: 92.1 fL (ref 78.0–100.0)
MONOS PCT: 5 %
Monocytes Absolute: 0.3 10*3/uL (ref 0.1–1.0)
NEUTROS PCT: 83 %
Neutro Abs: 5.7 10*3/uL (ref 1.7–7.7)
PLATELETS: 275 10*3/uL (ref 150–400)
RBC: 3.31 MIL/uL — AB (ref 4.22–5.81)
RDW: 18.9 % — ABNORMAL HIGH (ref 11.5–15.5)
WBC: 6.8 10*3/uL (ref 4.0–10.5)

## 2017-05-09 LAB — BASIC METABOLIC PANEL
ANION GAP: 8 (ref 5–15)
BUN: 24 mg/dL — ABNORMAL HIGH (ref 6–20)
CHLORIDE: 108 mmol/L (ref 101–111)
CO2: 21 mmol/L — ABNORMAL LOW (ref 22–32)
Calcium: 7.8 mg/dL — ABNORMAL LOW (ref 8.9–10.3)
Creatinine, Ser: 2.06 mg/dL — ABNORMAL HIGH (ref 0.61–1.24)
GFR, EST AFRICAN AMERICAN: 36 mL/min — AB (ref >60)
GFR, EST NON AFRICAN AMERICAN: 31 mL/min — AB (ref >60)
Glucose, Bld: 102 mg/dL — ABNORMAL HIGH (ref 65–99)
POTASSIUM: 4.5 mmol/L (ref 3.5–5.1)
SODIUM: 137 mmol/L (ref 135–145)

## 2017-05-09 LAB — BPAM RBC
BLOOD PRODUCT EXPIRATION DATE: 201806292359
BLOOD PRODUCT EXPIRATION DATE: 201806292359
Blood Product Expiration Date: 201806292359
ISSUE DATE / TIME: 201806072228
ISSUE DATE / TIME: 201806081249
ISSUE DATE / TIME: 201806081636
UNIT TYPE AND RH: 5100
Unit Type and Rh: 5100
Unit Type and Rh: 5100

## 2017-05-09 LAB — PROTIME-INR
INR: 0.96
Prothrombin Time: 12.8 seconds (ref 11.4–15.2)

## 2017-05-09 MED ORDER — NICOTINE 21 MG/24HR TD PT24
21.0000 mg | MEDICATED_PATCH | Freq: Every day | TRANSDERMAL | Status: DC
  Administered 2017-05-09 – 2017-05-11 (×3): 21 mg via TRANSDERMAL
  Filled 2017-05-09 (×3): qty 1

## 2017-05-09 NOTE — Progress Notes (Addendum)
Pt complained of severe bladder pain. Gv PRN pain meds. Pt continued to complain of pain. CBI appeared to have stopped running. Attempted irrigation, returned a couple small clots, however CBI still didn't flow.   Paged on call. Verbal order was given to stop CBI, order a stat US of the bladder and obtain the urology cart from the OR. MD made his way onto the floor. He took over management of the CBI and was able to get the fluids flowing which in turn stopped the pt's extreme pain.  Will continue to monitor. Conception Oms

## 2017-05-09 NOTE — Progress Notes (Signed)
   Pt feels much better. Resting  NAD Abd - soft, NT GU - CBI running slow and urine light pink  CBC    Component Value Date/Time   WBC 6.8 05/09/2017 0714   RBC 3.31 (L) 05/09/2017 0714   HGB 10.1 (L) 05/09/2017 0714   HCT 30.5 (L) 05/09/2017 0714   PLT 275 05/09/2017 0714   MCV 92.1 05/09/2017 0714   MCH 30.5 05/09/2017 0714   MCHC 33.1 05/09/2017 0714   RDW 18.9 (H) 05/09/2017 0714   LYMPHSABS 0.7 05/09/2017 0714   MONOABS 0.3 05/09/2017 0714   EOSABS 0.1 05/09/2017 0714   BASOSABS 0.0 05/09/2017 0714    A/P - gross hematuria - improving substantially. Consider d/c CBI and foley in AM. Pt really wants to go home without foley.   Bladder ca - for TURBT Tuesday with Dr. Diona Fanti. Discussed recurrent nature of HG urothelial with patient and family.  AKI - Cr improved.

## 2017-05-09 NOTE — Progress Notes (Addendum)
Subjective: Patient reports severe bladder spasm. Nurse checked bladder scan and read ~175 mL   Objective: Vital signs in last 24 hours: Temp:  [97.6 F (36.4 C)-98.7 F (37.1 C)] 97.8 F (36.6 C) (06/08 2232) Pulse Rate:  [60-78] 78 (06/08 2232) Resp:  [16-20] 18 (06/08 2232) BP: (91-151)/(51-91) 101/59 (06/08 2232) SpO2:  [92 %-97 %] 92 % (06/08 2232)  Intake/Output from previous day: 06/08 0701 - 06/09 0700 In: 20413.5 [I.V.:1337.5; Blood:576] Out: 18299 [BZJIR:[id number]] Intake/Output this shift: Total I/O In: 9282 [Blood:282; Other:9000] Out: 81017 [Urine:10625]  Physical Exam:  NAD - standing and had BM on floor  He got back in bed His abd was soft and umbilical hernia reducible, bladder not obvious distended I couldn't withdraw any fluid from bladder and CBI would not go in I advanced the catheter and got out about 100 ml and then irrigated out a few small clots CBI ran normally again with light pink urine and pt felt much better; bladder scan read 21 - 85 mL   Lab Results:  Recent Labs  05/07/17 1012 05/08/17 0548  HGB 5.9* 7.6*  HCT 18.0* 22.4*   BMET  Recent Labs  05/07/17 1012 05/08/17 0548  NA 133* 137  K 4.8 4.2  CL 102 108  CO2 20* 23  GLUCOSE 103* 90  BUN 43* 35*  CREATININE 2.87* 2.26*  CALCIUM 8.1* 7.7*   No results for input(s): LABPT, INR in the last 72 hours. No results for input(s): LABURIN in the last 72 hours. Results for orders placed or performed during the hospital encounter of 05/05/17  Culture, Urine     Status: None   Collection Time: 05/05/17  5:00 PM  Result Value Ref Range Status   Specimen Description URINE, RANDOM  Final   Special Requests NONE  Final   Culture   Final    NO GROWTH Performed at [facility], [place] [address] [place], [place] [address]    Report Status 05/07/2017 FINAL  Final    Studies/Results: US Renal  Result Date: 05/08/2017 CLINICAL DATA:  Acute kidney injury EXAM: RENAL / URINARY TRACT  ULTRASOUND COMPLETE COMPARISON:  CT 04/07/2017 FINDINGS: Right Kidney: Length: 11.4 cm. Increased cortical echogenicity. Moderate hydronephrosis may be slightly decreased compared to prior CT. Small cyst mid to lower pole right kidney measuring 0.9 x 0.8 x 1 cm. Left Kidney: Length: 12.2 cm. Increased cortical echogenicity. No hydronephrosis. Echogenic foci in the mid and lower pole may represent stones, the larger in the midpole measures 8 mm. A Foley catheter is present in the bladder which is empty. Bladder: Appears normal for degree of bladder distention. IMPRESSION: 1. Moderate right hydronephrosis, may be slightly decreased compared to prior CT allowing for limitations of inter modality comparison. CT demonstrated lower pole stone on the right not well seen on sonography. Small cyst in the midpole. 2. Increased cortical echogenicity bilaterally consistent with medical renal disease 3. Shadowing echogenic foci in the mid and lower kidney measuring up to 8 mm consistent with small stones 4. Limited assessment of bladder due to Foley catheter Electronically Signed   By: Donavan Foil M.D.   On: 05/08/2017 17:08    Assessment/Plan: -Recurrent bladder and now right ureteral urothelial cancer  -gross hematuria - bleeding does not seem significant. He has a radiated bladder and there is certainly a component of bladder spasm when trying to irrigate. Daily labs pending.    LOS: 2 days   Tyleah Loh 05/09/2017, 5:06 AM

## 2017-05-09 NOTE — Progress Notes (Signed)
PROGRESS NOTE    Carl Murphy  HER:[id number] DOB: Mar 10, 1946 DOA: 05/07/2017 PCP: [name], MD    Brief Narrative:  71 year old male who presented to the hospital with a chief complaint of hematuria. Patient known to have bladder cancer, worsening symptoms over last 5 days prior to hospitalization. June 5 received Foley catheter due to urinary obstruction, blood work showed significant anemia, he was referred to the ED for further evaluation and treatment. On initial physical examination, hemodynamically stable, no pallor or icterus, lungs were clear to auscultation, heart S1-S2 present rhythmic, soft abdomen, no lower extremity edema. Sodium 133, potassium 4.8, chloride 102, bicarbonate 20, glucose of 103, BUN 43, creatinine 2.87, white count 6.5, hemoglobin 5.9, hematocrit 18, platelets 346. Patient was admitted with working diagnosis of acute blood loss anemia due to hematuria. Patient placed on continuous bladder irrigation, sp 4 units prbc transfusion.    Assessment & Plan:   Principal Problem:   Acute blood loss anemia Active Problems:   Malignant neoplasm of trigone of bladder (HCC)   Gross hematuria   AKI (acute kidney injury) (HCC)   COPD (chronic obstructive pulmonary disease) (HCC)   Tobacco dependence   1. Acute blood loss anemia due to hematuria. Persistent bleeding, bladder irrigation has been resumed after clot management. Will continue to follow on hb and hct in am, patient sp 4 untis prbc transfusion. Continue ditropan and morphine for pain control.   2. AKI on CKD stage 3 (base cr 1,5 with calculated GFR 42) Cr down to 2,0 from peak of 2,8. Renal function close to baseline will hold on IV fluids for now and will follow on renal panel in am. Serum K at 4,5 with cl 108, Na 137 and serum bicarbonate 21. Renal US with moderate hydronephrosis on the right.     3. COPD. Stable and chronic, as needed bronchodilator therapy. Continue oxymetry monitoring and as needed  supplemental 02 per [place].   4. Tobacco abuse. Will continue smoking cessation, resume nicotine patch.    5. HTN. Continue on coreg, for blood pressure control, systolic 856 to 314.   6. Depression. On citalopram, bupropion and trazodone, no confusion or agitation.   Documented Heart failure, stable and chronic, suspected diastolic, no echocardiogram on record. Old records personally reviewed.    DVT prophylaxis: SCD Code Status: Full  Family Communication: I spoke with patient's family at the bedside and all questions were addressed.  Disposition Plan: Home   Consultants:   Urology   Procedures:    Antimicrobials:     Subjective: Patient had severe abdominal pain this am, associated with urine blood clots, patient was seen by urology irrigation was resumed with improvement of his abdominal pain. No further nausea or vomiting, no chest pain or dyspnea. Patient requesting nicotine patch.   Objective: Vitals:   05/08/17 1701 05/08/17 1913 05/08/17 2232 05/09/17 0653  BP: (!) 105/58 (!) 151/91 (!) 101/59 (!) 116/50  Pulse: 67 75 78 67  Resp: 18 20 18 18   Temp: 98.3 F (36.8 C) 98.7 F (37.1 C) 97.8 F (36.6 C) 97.8 F (36.6 C)  TempSrc: Oral Oral Oral Oral  SpO2: 94% 93% 92% 91%  Weight:      Height:        Intake/Output Summary (Last 24 hours) at 05/09/17 0913 Last data filed at 05/09/17 0717  Gross per 24 hour  Intake          26553.5 ml  Output  22000 ml  Net           4553.5 ml   Filed Weights   05/07/17 0906  Weight: 81.6 kg (180 lb)    Examination:  General exam: Not in pain or dyspnea E ENT: no pallor or icterus, oral mucosa moist.  Respiratory system: Clear to auscultation. Respiratory effort normal. Positive expiratory wheezing, but no rales or rhonchi. Cardiovascular system: S1 & S2 heard, RRR. No JVD, murmurs, rubs, gallops or clicks. No pedal edema. Gastrointestinal system: Abdomen is nondistended, soft and nontender. No  organomegaly or masses felt. Normal bowel sounds heard. Central nervous system: Alert and oriented. No focal neurological deficits. Extremities: Symmetric 5 x 5 power. Skin: No rashes, lesions or ulcers      Data Reviewed: I have personally reviewed following labs and imaging studies  CBC:  Recent Labs Lab 05/07/17 1012 05/08/17 0548 05/09/17 0714  WBC 6.5 4.1 6.8  NEUTROABS 5.6  --  PENDING  HGB 5.9* 7.6* 10.1*  HCT 18.0* 22.4* 30.5*  MCV 98.4 93.3 92.1  PLT 346 263 542   Basic Metabolic Panel:  Recent Labs Lab 05/07/17 1012 05/08/17 0548 05/09/17 0714  NA 133* 137 137  K 4.8 4.2 4.5  CL 102 108 108  CO2 20* 23 21*  GLUCOSE 103* 90 102*  BUN 43* 35* 24*  CREATININE 2.87* 2.26* 2.06*  CALCIUM 8.1* 7.7* 7.8*   GFR: Estimated Creatinine Clearance: 32.7 mL/min (A) (by C-G formula based on SCr of 2.06 mg/dL (H)). Liver Function Tests: No results for input(s): AST, ALT, ALKPHOS, BILITOT, PROT, ALBUMIN in the last 168 hours. No results for input(s): LIPASE, AMYLASE in the last 168 hours. No results for input(s): AMMONIA in the last 168 hours. Coagulation Profile:  Recent Labs Lab 05/09/17 0714  INR 0.96   Cardiac Enzymes: No results for input(s): CKTOTAL, CKMB, CKMBINDEX, TROPONINI in the last 168 hours. BNP (last 3 results) No results for input(s): PROBNP in the last 8760 hours. HbA1C: No results for input(s): HGBA1C in the last 72 hours. CBG: No results for input(s): GLUCAP in the last 168 hours. Lipid Profile: No results for input(s): CHOL, HDL, LDLCALC, TRIG, CHOLHDL, LDLDIRECT in the last 72 hours. Thyroid Function Tests: No results for input(s): TSH, T4TOTAL, FREET4, T3FREE, THYROIDAB in the last 72 hours. Anemia Panel: No results for input(s): VITAMINB12, FOLATE, FERRITIN, TIBC, IRON, RETICCTPCT in the last 72 hours. Sepsis Labs: No results for input(s): PROCALCITON, LATICACIDVEN in the last 168 hours.  Recent Results (from the past 240 hour(s))    Culture, Urine     Status: None   Collection Time: 05/05/17  5:00 PM  Result Value Ref Range Status   Specimen Description URINE, RANDOM  Final   Special Requests NONE  Final   Culture   Final    NO GROWTH Performed at [facility], [address] [place], [place] [address]    Report Status 05/07/2017 FINAL  Final         Radiology Studies: US Renal  Result Date: 05/08/2017 CLINICAL DATA:  Acute kidney injury EXAM: RENAL / URINARY TRACT ULTRASOUND COMPLETE COMPARISON:  CT 04/07/2017 FINDINGS: Right Kidney: Length: 11.4 cm. Increased cortical echogenicity. Moderate hydronephrosis may be slightly decreased compared to prior CT. Small cyst mid to lower pole right kidney measuring 0.9 x 0.8 x 1 cm. Left Kidney: Length: 12.2 cm. Increased cortical echogenicity. No hydronephrosis. Echogenic foci in the mid and lower pole may represent stones, the larger in the midpole  measures 8 mm. A Foley catheter is present in the bladder which is empty. Bladder: Appears normal for degree of bladder distention. IMPRESSION: 1. Moderate right hydronephrosis, may be slightly decreased compared to prior CT allowing for limitations of inter modality comparison. CT demonstrated lower pole stone on the right not well seen on sonography. Small cyst in the midpole. 2. Increased cortical echogenicity bilaterally consistent with medical renal disease 3. Shadowing echogenic foci in the mid and lower kidney measuring up to 8 mm consistent with small stones 4. Limited assessment of bladder due to Foley catheter Electronically Signed   By: Donavan Foil M.D.   On: 05/08/2017 17:08        Scheduled Meds: . buPROPion  150 mg Oral Daily  . carvedilol  25 mg Oral BID  . citalopram  20 mg Oral QHS  . feeding supplement (ENSURE ENLIVE)  237 mL Oral BID BM  . nicotine  21 mg Transdermal Once   Continuous Infusions: . sodium chloride 75 mL/hr at 05/08/17 0914  . sodium chloride       LOS: 2 days         Tawni Millers, MD [facility] Pager [phone]  If 7PM-7AM, please contact night-coverage [email or web address] Password [email or web address] 05/09/2017, 9:13 AM

## 2017-05-10 DIAGNOSIS — R310 Gross hematuria: Secondary | ICD-10-CM

## 2017-05-10 LAB — HEMOGLOBIN AND HEMATOCRIT, BLOOD
HEMATOCRIT: 22.6 % — AB (ref 39.0–52.0)
HEMOGLOBIN: 7.6 g/dL — AB (ref 13.0–17.0)

## 2017-05-10 LAB — BASIC METABOLIC PANEL
ANION GAP: 5 (ref 5–15)
BUN: 27 mg/dL — ABNORMAL HIGH (ref 6–20)
CALCIUM: 7.8 mg/dL — AB (ref 8.9–10.3)
CO2: 26 mmol/L (ref 22–32)
Chloride: 105 mmol/L (ref 101–111)
Creatinine, Ser: 2 mg/dL — ABNORMAL HIGH (ref 0.61–1.24)
GFR, EST AFRICAN AMERICAN: 37 mL/min — AB (ref >60)
GFR, EST NON AFRICAN AMERICAN: 32 mL/min — AB (ref >60)
Glucose, Bld: 116 mg/dL — ABNORMAL HIGH (ref 65–99)
Potassium: 4.4 mmol/L (ref 3.5–5.1)
SODIUM: 136 mmol/L (ref 135–145)

## 2017-05-10 LAB — CBC WITH DIFFERENTIAL/PLATELET
BASOS ABS: 0 10*3/uL (ref 0.0–0.1)
BASOS PCT: 0 %
EOS ABS: 0.2 10*3/uL (ref 0.0–0.7)
Eosinophils Relative: 3 %
HCT: 24.9 % — ABNORMAL LOW (ref 39.0–52.0)
Hemoglobin: 8.3 g/dL — ABNORMAL LOW (ref 13.0–17.0)
Lymphocytes Relative: 8 %
Lymphs Abs: 0.4 10*3/uL — ABNORMAL LOW (ref 0.7–4.0)
MCH: 30.6 pg (ref 26.0–34.0)
MCHC: 33.3 g/dL (ref 30.0–36.0)
MCV: 91.9 fL (ref 78.0–100.0)
MONO ABS: 0.8 10*3/uL (ref 0.1–1.0)
Monocytes Relative: 15 %
NEUTROS ABS: 3.7 10*3/uL (ref 1.7–7.7)
Neutrophils Relative %: 74 %
PLATELETS: 284 10*3/uL (ref 150–400)
RBC: 2.71 MIL/uL — ABNORMAL LOW (ref 4.22–5.81)
RDW: 18.7 % — AB (ref 11.5–15.5)
WBC: 5.1 10*3/uL (ref 4.0–10.5)

## 2017-05-10 LAB — GLUCOSE, CAPILLARY: GLUCOSE-CAPILLARY: 108 mg/dL — AB (ref 65–99)

## 2017-05-10 LAB — PREPARE RBC (CROSSMATCH)

## 2017-05-10 MED ORDER — SODIUM CHLORIDE 0.9 % IV SOLN
Freq: Once | INTRAVENOUS | Status: AC
  Administered 2017-05-10: 23:00:00 via INTRAVENOUS

## 2017-05-10 NOTE — Progress Notes (Signed)
Pt is complaining of mild pain episodes, still having adequate urine output per hour but still very bloody red in color. No clots have been seen in urine yet. Asked patient if he wanted me to irrigate to see if any clots were there but patient is refusing now. Will check on patient and follow up, may need to be irrigated again.  Carmela Hurt, RN 10:52 AM 05/10/17

## 2017-05-10 NOTE — Progress Notes (Addendum)
Assessment/Plan: -gross hematuria - CBI not really doing a lot at this point. Would d/c cbi and cap inflow. If hematuria light and catheter drains he can go home. Hgb down this AM, but again hematuria is light. His kidney function is normal.  -recurrent bladder ca, right ureter ca - has TURBT scheduled for Tuesday at [facility].  Other option is to transfer to [facility] hospitalist on Monday if Dr. Diona Fanti plans to keep case at [facility].   Addendum -- urine dark purple off CBI. No clots. Draining. Pt getting h/h later. Likely to stay. Pt would not let me irrigate catheter. Abd - soft. I spoke with Dr. Cathlean Sauer.   Subjective: Patient reports no complaints. Really wants to go home.   Objective: Vital signs in last 24 hours: Temp:  [98.1 F (36.7 C)-99.3 F (37.4 C)] 98.3 F (36.8 C) (06/10 0510) Pulse Rate:  [60-90] 60 (06/10 0510) Resp:  [18-20] 18 (06/10 0510) BP: (103-156)/(64-66) 103/64 (06/10 0510) SpO2:  [94 %-95 %] 94 % (06/10 0510)  Intake/Output from previous day: 06/09 0701 - 06/10 0700 In: 17494 [P.O.:240; I.V.:305] Out: 21100 [Urine:21100] Intake/Output this shift: No intake/output data recorded.  Physical Exam:  NAD Abd - soft  CBI on a gtt every few seconds and urine light pink in tubing and even clear urine on top  Lab Results:  Recent Labs  05/08/17 0548 05/09/17 0714 05/10/17 0535  HGB 7.6* 10.1* 8.3*  HCT 22.4* 30.5* 24.9*   BMET  Recent Labs  05/09/17 0714 05/10/17 0535  NA 137 136  K 4.5 4.4  CL 108 105  CO2 21* 26  GLUCOSE 102* 116*  BUN 24* 27*  CREATININE 2.06* 2.00*  CALCIUM 7.8* 7.8*    Recent Labs  05/09/17 0714  INR 0.96   No results for input(s): LABURIN in the last 72 hours. Results for orders placed or performed during the hospital encounter of 05/05/17  Culture, Urine     Status: None   Collection Time: 05/05/17  5:00 PM  Result Value Ref Range Status   Specimen Description URINE, RANDOM  Final   Special Requests NONE   Final   Culture   Final    NO GROWTH Performed at [facility], [place] [address] [place], [place] [address]    Report Status 05/07/2017 FINAL  Final    Studies/Results: US Renal  Result Date: 05/08/2017 CLINICAL DATA:  Acute kidney injury EXAM: RENAL / URINARY TRACT ULTRASOUND COMPLETE COMPARISON:  CT 04/07/2017 FINDINGS: Right Kidney: Length: 11.4 cm. Increased cortical echogenicity. Moderate hydronephrosis may be slightly decreased compared to prior CT. Small cyst mid to lower pole right kidney measuring 0.9 x 0.8 x 1 cm. Left Kidney: Length: 12.2 cm. Increased cortical echogenicity. No hydronephrosis. Echogenic foci in the mid and lower pole may represent stones, the larger in the midpole measures 8 mm. A Foley catheter is present in the bladder which is empty. Bladder: Appears normal for degree of bladder distention. IMPRESSION: 1. Moderate right hydronephrosis, may be slightly decreased compared to prior CT allowing for limitations of inter modality comparison. CT demonstrated lower pole stone on the right not well seen on sonography. Small cyst in the midpole. 2. Increased cortical echogenicity bilaterally consistent with medical renal disease 3. Shadowing echogenic foci in the mid and lower kidney measuring up to 8 mm consistent with small stones 4. Limited assessment of bladder due to Foley catheter Electronically Signed   By: Donavan Foil M.D.   On: 05/08/2017  17:08       LOS: 3 days   Joffrey Kerce 05/10/2017, 7:31 AM

## 2017-05-10 NOTE — Progress Notes (Signed)
Nutrition Brief Note  Patient identified on the Malnutrition Screening Tool (MST) Report  Pt with minimal weight loss, 4% wt loss x 4.5 months, insignificant for time frame. Pt ate 100% of his breakfast this morning (provided ~550 kcal, 26g protein).  Wt Readings from Last 15 Encounters:  05/07/17 180 lb (81.6 kg)  12/30/16 187 lb 3.2 oz (84.9 kg)  12/16/16 186 lb (84.4 kg)  12/11/16 186 lb (84.4 kg)  11/05/16 186 lb 4.8 oz (84.5 kg)  10/06/16 181 lb 12.8 oz (82.5 kg)  09/08/16 183 lb 3.2 oz (83.1 kg)  09/01/16 182 lb 9.6 oz (82.8 kg)  08/25/16 185 lb 3.2 oz (84 kg)  08/19/16 187 lb 8 oz (85 kg)  08/18/16 188 lb (85.3 kg)  08/11/16 188 lb (85.3 kg)  08/05/16 189 lb 6.4 oz (85.9 kg)  07/29/16 191 lb 9.6 oz (86.9 kg)  07/28/16 191 lb 9.6 oz (86.9 kg)    Body mass index is 27.37 kg/m. Patient meets criteria for overweight based on current BMI.   Current diet order is regular, patient is consuming approximately 100% of meals at this time. Labs and medications reviewed.   No nutrition interventions warranted at this time. If nutrition issues arise, please consult RD.   Clayton Bibles, MS, RD, LDN Pager: [phone] After Hours Pager: [phone]

## 2017-05-10 NOTE — Progress Notes (Signed)
PROGRESS NOTE    Carl Murphy  JQB:[id number] DOB: 01-07-1946 DOA: 05/07/2017 PCP: [name], MD     Brief Narrative:  71 year old male who presented to the hospital with a chief complaint of hematuria. Patient known to have bladder cancer, worsening symptoms over last 5 days prior to hospitalization. June 5 received Foley catheter due to urinary obstruction, blood work showed significant anemia, he was referred to the ED for further evaluation and treatment. On initial physical examination, hemodynamically stable, no pallor or icterus, lungs were clear to auscultation, heart S1-S2 present rhythmic, soft abdomen, no lower extremity edema. Sodium 133, potassium 4.8, chloride 102, bicarbonate 20, glucose of 103, BUN 43, creatinine 2.87, white count 6.5, hemoglobin 5.9, hematocrit 18, platelets 346. Patient was admitted with working diagnosis of acute blood loss anemia due to hematuria. Patient placed on continuous bladder irrigation, sp 4 units prbc transfusion. Discontinued continuous bladder irrigation 06/10.     Assessment & Plan:   Principal Problem:   Acute blood loss anemia Active Problems:   Malignant neoplasm of trigone of bladder (HCC)   Gross hematuria   AKI (acute kidney injury) (HCC)   COPD (chronic obstructive pulmonary disease) (HCC)   Tobacco dependence   1. Acute blood loss anemia due to hematuria. Patient with persistent hematuria- bleeding, Hb has dropped to 8 from 10, will check H&H at 1600, will plan to transfuse blood if level below 8. Patient sp 4 untis prbc transfusion. On ditropan and morphine for pain control. Continuous bladder irrigation has been discontinued by Urology, will continue close observation. Case discussed with Dr. Junious Silk, will call primary Urology in am, for possible transfer to [facility] for surgical procedure.   2. AKI on CKD stage 3 (base cr 1,5 with calculated GFR 42), right hydronephrosis.  Serum cr stable at  2,0. Positive for right hydronephrosis  per [place], will continue gentle IV fluids, follow on renal function in am, avoid hypotension or nephrotoxic medications. Serum bicarbonate at 26 and K at 4,4.   3. COPD. Stable and chronic,continue as needed bronchodilator therapy and oxymetry monitoring with vital signs. Clinically euvolemic,  4. Tobacco abuse. Smoking cessation, resume nicotine patch.   5. HTN. On coreg, for blood pressure control, systolic blood pressure 409.    6. Depression. Continue citalopram, bupropion and trazodone, no confusion or agitation.   Documented Heart failure, stable and chronic, suspected diastolic, no echocardiogram on record. Old records personally reviewed.    Subjective: Intermittent lower abdominal pain, 3-4/10, transitory, no nausea or vomiting, no chest pain or worsening dyspnea. Persistent hematuria. No dizziness or lightheadedness.   Objective: Vitals:   05/09/17 0653 05/09/17 1501 05/09/17 2056 05/10/17 0510  BP: (!) 116/50 123/65 (!) 156/66 103/64  Pulse: 67 65 90 60  Resp: 18 18 20 18   Temp: 97.8 F (36.6 C) 98.1 F (36.7 C) 99.3 F (37.4 C) 98.3 F (36.8 C)  TempSrc: Oral Oral Oral Oral  SpO2: 91% 95% 94% 94%  Weight:      Height:        Intake/Output Summary (Last 24 hours) at 05/10/17 0859 Last data filed at 05/10/17 0742  Gross per 24 hour  Intake            16445 ml  Output            20300 ml  Net            -3855 ml   Filed Weights   05/07/17 0906  Weight: 81.6 kg (180 lb)  Examination:  General exam: not in pain or dyspnea E ENT. Mild pallor, oral mucosa moist, no icterus.   Respiratory system: Clear to auscultation. Respiratory effort normal. This am no wheezing, rales or rhonchi. Cardiovascular system: S1 & S2 heard, RRR. No JVD, murmurs, rubs, gallops or clicks. No pedal edema. Gastrointestinal system: Abdomen is nondistended, soft and nontender. No organomegaly or masses felt. Normal bowel sounds heard. No palpable masses.  Central nervous system:  Alert and oriented. No focal neurological deficits. Extremities: Symmetric 5 x 5 power. Skin: No rashes, lesions or ulcers    Data Reviewed: I have personally reviewed following labs and imaging studies  CBC:  Recent Labs Lab 05/07/17 1012 05/08/17 0548 05/09/17 0714 05/10/17 0535  WBC 6.5 4.1 6.8 5.1  NEUTROABS 5.6  --  5.7 3.7  HGB 5.9* 7.6* 10.1* 8.3*  HCT 18.0* 22.4* 30.5* 24.9*  MCV 98.4 93.3 92.1 91.9  PLT 346 263 275 696   Basic Metabolic Panel:  Recent Labs Lab 05/07/17 1012 05/08/17 0548 05/09/17 0714 05/10/17 0535  NA 133* 137 137 136  K 4.8 4.2 4.5 4.4  CL 102 108 108 105  CO2 20* 23 21* 26  GLUCOSE 103* 90 102* 116*  BUN 43* 35* 24* 27*  CREATININE 2.87* 2.26* 2.06* 2.00*  CALCIUM 8.1* 7.7* 7.8* 7.8*   GFR: Estimated Creatinine Clearance: 33.7 mL/min (A) (by C-G formula based on SCr of 2 mg/dL (H)). Liver Function Tests: No results for input(s): AST, ALT, ALKPHOS, BILITOT, PROT, ALBUMIN in the last 168 hours. No results for input(s): LIPASE, AMYLASE in the last 168 hours. No results for input(s): AMMONIA in the last 168 hours. Coagulation Profile:  Recent Labs Lab 05/09/17 0714  INR 0.96   Cardiac Enzymes: No results for input(s): CKTOTAL, CKMB, CKMBINDEX, TROPONINI in the last 168 hours. BNP (last 3 results) No results for input(s): PROBNP in the last 8760 hours. HbA1C: No results for input(s): HGBA1C in the last 72 hours. CBG: No results for input(s): GLUCAP in the last 168 hours. Lipid Profile: No results for input(s): CHOL, HDL, LDLCALC, TRIG, CHOLHDL, LDLDIRECT in the last 72 hours. Thyroid Function Tests: No results for input(s): TSH, T4TOTAL, FREET4, T3FREE, THYROIDAB in the last 72 hours. Anemia Panel: No results for input(s): VITAMINB12, FOLATE, FERRITIN, TIBC, IRON, RETICCTPCT in the last 72 hours. Sepsis Labs: No results for input(s): PROCALCITON, LATICACIDVEN in the last 168 hours.  Recent Results (from the past 240  hour(s))  Culture, Urine     Status: None   Collection Time: 05/05/17  5:00 PM  Result Value Ref Range Status   Specimen Description URINE, RANDOM  Final   Special Requests NONE  Final   Culture   Final    NO GROWTH Performed at [facility], [address] [place], [place] [address]    Report Status 05/07/2017 FINAL  Final         Radiology Studies: US Renal  Result Date: 05/08/2017 CLINICAL DATA:  Acute kidney injury EXAM: RENAL / URINARY TRACT ULTRASOUND COMPLETE COMPARISON:  CT 04/07/2017 FINDINGS: Right Kidney: Length: 11.4 cm. Increased cortical echogenicity. Moderate hydronephrosis may be slightly decreased compared to prior CT. Small cyst mid to lower pole right kidney measuring 0.9 x 0.8 x 1 cm. Left Kidney: Length: 12.2 cm. Increased cortical echogenicity. No hydronephrosis. Echogenic foci in the mid and lower pole may represent stones, the larger in the midpole measures 8 mm. A Foley catheter is present in the bladder which is empty.  Bladder: Appears normal for degree of bladder distention. IMPRESSION: 1. Moderate right hydronephrosis, may be slightly decreased compared to prior CT allowing for limitations of inter modality comparison. CT demonstrated lower pole stone on the right not well seen on sonography. Small cyst in the midpole. 2. Increased cortical echogenicity bilaterally consistent with medical renal disease 3. Shadowing echogenic foci in the mid and lower kidney measuring up to 8 mm consistent with small stones 4. Limited assessment of bladder due to Foley catheter Electronically Signed   By: Donavan Foil M.D.   On: 05/08/2017 17:08        Scheduled Meds: . buPROPion  150 mg Oral Daily  . carvedilol  25 mg Oral BID  . citalopram  20 mg Oral QHS  . feeding supplement (ENSURE ENLIVE)  237 mL Oral BID BM  . nicotine  21 mg Transdermal Daily   Continuous Infusions: . sodium chloride       LOS: 3 days       Amil Moseman Gerome Apley, MD [facility] Pager [phone]  If 7PM-7AM, please contact night-coverage [email or web address] Password [email or web address] 05/10/2017, 8:59 AM

## 2017-05-11 ENCOUNTER — Inpatient Hospital Stay (HOSPITAL_COMMUNITY): Payer: Medicare Other | Admitting: Certified Registered Nurse Anesthetist

## 2017-05-11 ENCOUNTER — Encounter (HOSPITAL_COMMUNITY): Payer: Self-pay | Admitting: Certified Registered Nurse Anesthetist

## 2017-05-11 ENCOUNTER — Ambulatory Visit (HOSPITAL_COMMUNITY): Admission: RE | Admit: 2017-05-11 | Payer: Medicare Other | Source: Ambulatory Visit | Admitting: Urology

## 2017-05-11 ENCOUNTER — Encounter (HOSPITAL_COMMUNITY)
Admission: EM | Disposition: A | Discharge status: Home / Self Care | Payer: Self-pay | Source: Home / Self Care | Attending: Internal Medicine

## 2017-05-11 ENCOUNTER — Encounter (HOSPITAL_COMMUNITY)
Admit: 2017-05-11 | Discharge: 2017-05-11 | Disposition: A | Discharge status: Home / Self Care | Payer: Medicare Other | Attending: Urology | Admitting: Urology

## 2017-05-11 HISTORY — PX: TRANSURETHRAL RESECTION OF BLADDER TUMOR: SHX2575

## 2017-05-11 LAB — TYPE AND SCREEN
ABO/RH(D): O POS
ABO/RH(D): O POS
Antibody Screen: NEGATIVE
Antibody Screen: NEGATIVE
UNIT DIVISION: 0
Unit division: 0
Unit division: 0

## 2017-05-11 LAB — BPAM RBC
BLOOD PRODUCT EXPIRATION DATE: 201806252359
BLOOD PRODUCT EXPIRATION DATE: 201806232359
Blood Product Expiration Date: 201806252359
ISSUE DATE / TIME: 201806071143
ISSUE DATE / TIME: 201806102243
Unit Type and Rh: 5100
Unit Type and Rh: 5100
Unit Type and Rh: 5100

## 2017-05-11 LAB — CBC WITH DIFFERENTIAL/PLATELET
BASOS ABS: 0 10*3/uL (ref 0.0–0.1)
BASOS PCT: 1 %
Eosinophils Absolute: 0.2 10*3/uL (ref 0.0–0.7)
Eosinophils Relative: 4 %
HEMATOCRIT: 25.9 % — AB (ref 39.0–52.0)
Hemoglobin: 8.6 g/dL — ABNORMAL LOW (ref 13.0–17.0)
Lymphocytes Relative: 12 %
Lymphs Abs: 0.5 10*3/uL — ABNORMAL LOW (ref 0.7–4.0)
MCH: 29.9 pg (ref 26.0–34.0)
MCHC: 33.2 g/dL (ref 30.0–36.0)
MCV: 89.9 fL (ref 78.0–100.0)
Monocytes Absolute: 0.5 10*3/uL (ref 0.1–1.0)
Monocytes Relative: 12 %
NEUTROS ABS: 3.1 10*3/uL (ref 1.7–7.7)
NEUTROS PCT: 71 %
Platelets: 271 10*3/uL (ref 150–400)
RBC: 2.88 MIL/uL — ABNORMAL LOW (ref 4.22–5.81)
RDW: 17.5 % — AB (ref 11.5–15.5)
WBC: 4.4 10*3/uL (ref 4.0–10.5)

## 2017-05-11 LAB — BASIC METABOLIC PANEL
ANION GAP: 8 (ref 5–15)
BUN: 25 mg/dL — AB (ref 6–20)
CALCIUM: 7.5 mg/dL — AB (ref 8.9–10.3)
CO2: 23 mmol/L (ref 22–32)
Chloride: 103 mmol/L (ref 101–111)
Creatinine, Ser: 1.93 mg/dL — ABNORMAL HIGH (ref 0.61–1.24)
GFR calc Af Amer: 39 mL/min — ABNORMAL LOW (ref >60)
GFR, EST NON AFRICAN AMERICAN: 34 mL/min — AB (ref >60)
GLUCOSE: 92 mg/dL (ref 65–99)
Potassium: 4.5 mmol/L (ref 3.5–5.1)
SODIUM: 134 mmol/L — AB (ref 135–145)

## 2017-05-11 LAB — SURGICAL PCR SCREEN
MRSA, PCR: NEGATIVE
Staphylococcus aureus: NEGATIVE

## 2017-05-11 SURGERY — TURBT (TRANSURETHRAL RESECTION OF BLADDER TUMOR)
Anesthesia: Spinal

## 2017-05-11 MED ORDER — PROPOFOL 10 MG/ML IV BOLUS
INTRAVENOUS | Status: DC | PRN
  Administered 2017-05-11 (×3): 20 mg via INTRAVENOUS

## 2017-05-11 MED ORDER — LACTATED RINGERS IV SOLN
INTRAVENOUS | Status: DC | PRN
  Administered 2017-05-11: 18:00:00 via INTRAVENOUS

## 2017-05-11 MED ORDER — STERILE WATER FOR IRRIGATION IR SOLN
Status: DC | PRN
  Administered 2017-05-11: 15000 mL via INTRAVESICAL

## 2017-05-11 MED ORDER — EPHEDRINE SULFATE 50 MG/ML IJ SOLN
INTRAMUSCULAR | Status: DC | PRN
  Administered 2017-05-11 (×3): 10 mg via INTRAVENOUS

## 2017-05-11 MED ORDER — BUPIVACAINE IN DEXTROSE 0.75-8.25 % IT SOLN
INTRATHECAL | Status: DC | PRN
  Administered 2017-05-11: 13.5 mg via INTRATHECAL

## 2017-05-11 MED ORDER — FENTANYL CITRATE (PF) 100 MCG/2ML IJ SOLN
INTRAMUSCULAR | Status: DC | PRN
  Administered 2017-05-11: 50 ug via INTRAVENOUS

## 2017-05-11 MED ORDER — CEFAZOLIN SODIUM-DEXTROSE 2-4 GM/100ML-% IV SOLN
2.0000 g | INTRAVENOUS | Status: AC
  Administered 2017-05-11: 2 g via INTRAVENOUS

## 2017-05-11 MED ORDER — ONDANSETRON HCL 4 MG/2ML IJ SOLN
INTRAMUSCULAR | Status: DC | PRN
  Administered 2017-05-11: 4 mg via INTRAVENOUS

## 2017-05-11 MED ORDER — CEFAZOLIN SODIUM-DEXTROSE 2-4 GM/100ML-% IV SOLN
INTRAVENOUS | Status: AC
  Filled 2017-05-11: qty 100

## 2017-05-11 MED ORDER — DEXAMETHASONE SODIUM PHOSPHATE 10 MG/ML IJ SOLN
INTRAMUSCULAR | Status: DC | PRN
  Administered 2017-05-11: 20 mg via INTRAVENOUS
  Administered 2017-05-11: 10 mg via INTRAVENOUS

## 2017-05-11 MED ORDER — LACTATED RINGERS IV SOLN: INTRAVENOUS | Status: DC

## 2017-05-11 MED ORDER — CEFAZOLIN SODIUM-DEXTROSE 1-4 GM/50ML-% IV SOLN: 1.0000 g | INTRAVENOUS | Status: DC

## 2017-05-11 MED ORDER — CEFAZOLIN (ANCEF) 1 G IV SOLR: 1.0000 g | INTRAVENOUS | Status: DC

## 2017-05-11 MED ORDER — MIDAZOLAM HCL 5 MG/5ML IJ SOLN
INTRAMUSCULAR | Status: DC | PRN
  Administered 2017-05-11 (×2): 1 mg via INTRAVENOUS

## 2017-05-11 SURGICAL SUPPLY — 22 items
BAG URINE DRAINAGE (UROLOGICAL SUPPLIES) IMPLANT
BAG URO CATCHER STRL LF (MISCELLANEOUS) ×2 IMPLANT
CATH FOLEY 2WAY SLVR 30CC 24FR (CATHETERS) IMPLANT
CATH HEMA 3WAY 30CC 24FR COUDE (CATHETERS) ×2 IMPLANT
CATH INTERMIT  6FR 70CM (CATHETERS) ×2 IMPLANT
COVER SURGICAL LIGHT HANDLE (MISCELLANEOUS) ×2 IMPLANT
ELECT REM PT RETURN 15FT ADLT (MISCELLANEOUS) ×2 IMPLANT
EVACUATOR MICROVAS BLADDER (UROLOGICAL SUPPLIES) IMPLANT
GLOVE BIOGEL M 8.0 STRL (GLOVE) ×2 IMPLANT
GOWN STRL REUS W/ TWL XL LVL3 (GOWN DISPOSABLE) ×1 IMPLANT
GOWN STRL REUS W/TWL XL LVL3 (GOWN DISPOSABLE) ×3 IMPLANT
GUIDEWIRE STR DUAL SENSOR (WIRE) ×2 IMPLANT
LOOP MONOPOLAR YLW (ELECTROSURGICAL) ×2 IMPLANT
MANIFOLD NEPTUNE II (INSTRUMENTS) ×2 IMPLANT
NDL SAFETY ECLIPSE 18X1.5 (NEEDLE) ×1 IMPLANT
NEEDLE HYPO 18GX1.5 SHARP (NEEDLE) ×1
PACK CYSTO (CUSTOM PROCEDURE TRAY) ×2 IMPLANT
SET ASPIRATION TUBING (TUBING) IMPLANT
SYR 30ML LL (SYRINGE) ×2 IMPLANT
SYRINGE IRR TOOMEY STRL 70CC (SYRINGE) ×2 IMPLANT
TUBING CONNECTING 10 (TUBING) ×2 IMPLANT
WATER STERILE IRR 3000ML UROMA (IV SOLUTION) ×2 IMPLANT

## 2017-05-11 NOTE — Anesthesia Preprocedure Evaluation (Addendum)
Anesthesia Evaluation  Patient identified by MRN, date of birth, ID band Patient awake    Reviewed: Allergy & Precautions, NPO status , Patient's Chart, lab work & pertinent test results  History of Anesthesia Complications (+) PONV  Airway Mallampati: II  TM Distance: <3 FB Neck ROM: Full    Dental  (+) Dental Advisory Given, Poor Dentition, Chipped, Missing, Loose   Pulmonary COPD,  COPD inhaler, Current Smoker,    breath sounds clear to auscultation       Cardiovascular hypertension, Pt. on home beta blockers and Pt. on medications (-) angina Rhythm:Regular Rate:Normal     Neuro/Psych Depression negative neurological ROS     GI/Hepatic negative GI ROS, Neg liver ROS,   Endo/Other  negative endocrine ROS  Renal/GU Renal InsufficiencyRenal disease (creat 1.93)   Bladder tumor    Musculoskeletal  (+) Arthritis ,   Abdominal   Peds  Hematology  (+) Blood dyscrasia (Hb 8.6), anemia ,   Anesthesia Other Findings   Reproductive/Obstetrics                            Anesthesia Physical Anesthesia Plan  ASA: III  Anesthesia Plan: Spinal   Post-op Pain Management:    Induction:   PONV Risk Score and Plan: 1 and Ondansetron and Dexamethasone  Airway Management Planned: Natural Airway  Additional Equipment:   Intra-op Plan:   Post-operative Plan:   Informed Consent: I have reviewed the patients History and Physical, chart, labs and discussed the procedure including the risks, benefits and alternatives for the proposed anesthesia with the patient or authorized representative who has indicated his/her understanding and acceptance.   Dental advisory given  Plan Discussed with: CRNA and Surgeon  Anesthesia Plan Comments: (Plan routine monitors, SAB Pt strongly prefers SAB)        Anesthesia Quick Evaluation

## 2017-05-11 NOTE — Transfer of Care (Signed)
Immediate Anesthesia Transfer of Care Note  Patient: Carl Murphy  Procedure(s) Performed: Procedure(s) with comments: TRANSURETHRAL RESECTION OF BLADDER TUMOR (TURBT) CLOT EVACUATION (N/A) - 1 HR 520 [id number] BLUE MEDICARE-[id number]  Patient Location: PACU  Anesthesia Type:Spinal  Level of Consciousness:  sedated, patient cooperative and responds to stimulation  Airway & Oxygen Therapy:Patient Spontanous Breathing and Patient connected to face mask oxgen  Post-op Assessment:  Report given to PACU RN and Post -op Vital signs reviewed and stable  Post vital signs:  Reviewed and stable  Last Vitals:  Vitals:   05/11/17 0515 05/11/17 1604  BP: 122/80 120/70  Pulse: 67 66  Resp: 18 17  Temp: 36.6 C 53.9 C    Complications: No apparent anesthesia complications

## 2017-05-11 NOTE — Progress Notes (Signed)
I have added pt on for procedure to resect bladder tumor this pm--will make npo. OK to transfuse if medicaine deems necessary.  I have spoken with pt/daughter about procedure whichwas originally scheduled to be done @ [facility].

## 2017-05-11 NOTE — Progress Notes (Signed)
PROGRESS NOTE    Carl Murphy  SWF:[id number] DOB: 1946/02/26 DOA: 05/07/2017 PCP: [name], MD    Brief Narrative:  71 year old male who presented to the hospital with a chief complaint of hematuria. Patient known to have bladder cancer, worsening symptoms over last 5 days prior to hospitalization. June 5 received Foley catheter due to urinary obstruction, blood work showed significant anemia, he was referred to the ED for further evaluation and treatment. On initial physical examination, hemodynamically stable, no pallor or icterus, lungs were clear to auscultation, heart S1-S2 present rhythmic, soft abdomen, no lower extremity edema. Sodium 133, potassium 4.8, chloride 102, bicarbonate 20, glucose of 103, BUN 43, creatinine 2.87, white count 6.5, hemoglobin 5.9, hematocrit 18, platelets 346. Patient was admitted with working diagnosis of acute blood loss anemia due to hematuria. Patient placed on continuous bladder irrigation, sp 4 units prbc transfusion. Discontinued continuous bladder irrigation 06/10. For OR (06/11)   Assessment & Plan:   Principal Problem:   Acute blood loss anemia Active Problems:   Malignant neoplasm of trigone of bladder (HCC)   Gross hematuria   AKI (acute kidney injury) (HCC)   COPD (chronic obstructive pulmonary disease) (HCC)   Tobacco dependence    1. Acute blood loss anemia due to hematuria. Persistent hematuria- bleeding, Hb has dropped below 8, had blood transfusion with Hb going up to 8,6. Total 5 units of prbc transfused. Continue pain control with ditropan and morphine. Plan to take patient today to the OR per urology. Continue to follow surgical team recommendations.   2. AKI on CKD stage 3 (base cr 1,5 with calculated GFR 42), right hydronephrosis.  Cr at 1.9, will continue supportive medical therapy. Patient tolerating po well, will hold on IV fluids for now.   3. COPD. Continue to be stable, as needed bronchodilator therapy and oxymetry  monitoring with vital signs.   4. Tobacco abuse. Continue smoking cessation, resume nicotine patch.   5. HTN. Blood pressure control with coreg, with systolic 220'U.   6. Depression.Patient will continue on citalopram, bupropion and trazodone. Tolerating well.   Documented Heart failure, stable and chronic, suspected diastolic, no echocardiogram on record. Old records personally reviewed   Subjective: Patient with persistent bladder spasms, no nausea or vomiting, no fever or chills. Continue hematuria, required one unit prbc yesterday.   Objective: Vitals:   05/10/17 2245 05/10/17 2310 05/11/17 0205 05/11/17 0515  BP: 137/67 (!) 105/58 121/73 122/80  Pulse: 67 65 64 67  Resp: 18 18 18 18   Temp: 97.8 F (36.6 C) 98.1 F (36.7 C) 97.5 F (36.4 C) 97.9 F (36.6 C)  TempSrc: Oral Oral Oral Oral  SpO2: 94% 96% 95% 95%  Weight:      Height:        Intake/Output Summary (Last 24 hours) at 05/11/17 0911 Last data filed at 05/11/17 0809  Gross per 24 hour  Intake              702 ml  Output             3400 ml  Net            -2698 ml   Filed Weights   05/07/17 0906  Weight: 81.6 kg (180 lb)    Examination:  General exam: deconditioned E ENT: no pallor or icterus Respiratory system: Clear to auscultation. Respiratory effort normal. No wheezing, or rhonchi.  Cardiovascular system: S1 & S2 heard, RRR. No JVD, murmurs, rubs, gallops or clicks. No pedal  edema. Gastrointestinal system: Abdomen is nondistended, soft and nontender. No organomegaly or masses felt. Normal bowel sounds heard. Central nervous system: Alert and oriented. No focal neurological deficits. Extremities: Symmetric 5 x 5 power. Skin: No rashes, lesions or ulcers Foley catheter in place with bloody urine.     Data Reviewed: I have personally reviewed following labs and imaging studies  CBC:  Recent Labs Lab 05/07/17 1012 05/08/17 0548 05/09/17 0714 05/10/17 0535 05/10/17 1558 05/11/17 0543   WBC 6.5 4.1 6.8 5.1  --  4.4  NEUTROABS 5.6  --  5.7 3.7  --  3.1  HGB 5.9* 7.6* 10.1* 8.3* 7.6* 8.6*  HCT 18.0* 22.4* 30.5* 24.9* 22.6* 25.9*  MCV 98.4 93.3 92.1 91.9  --  89.9  PLT 346 263 275 284  --  443   Basic Metabolic Panel:  Recent Labs Lab 05/07/17 1012 05/08/17 0548 05/09/17 0714 05/10/17 0535 05/11/17 0543  NA 133* 137 137 136 134*  K 4.8 4.2 4.5 4.4 4.5  CL 102 108 108 105 103  CO2 20* 23 21* 26 23  GLUCOSE 103* 90 102* 116* 92  BUN 43* 35* 24* 27* 25*  CREATININE 2.87* 2.26* 2.06* 2.00* 1.93*  CALCIUM 8.1* 7.7* 7.8* 7.8* 7.5*   GFR: Estimated Creatinine Clearance: 34.9 mL/min (A) (by C-G formula based on SCr of 1.93 mg/dL (H)). Liver Function Tests: No results for input(s): AST, ALT, ALKPHOS, BILITOT, PROT, ALBUMIN in the last 168 hours. No results for input(s): LIPASE, AMYLASE in the last 168 hours. No results for input(s): AMMONIA in the last 168 hours. Coagulation Profile:  Recent Labs Lab 05/09/17 0714  INR 0.96   Cardiac Enzymes: No results for input(s): CKTOTAL, CKMB, CKMBINDEX, TROPONINI in the last 168 hours. BNP (last 3 results) No results for input(s): PROBNP in the last 8760 hours. HbA1C: No results for input(s): HGBA1C in the last 72 hours. CBG:  Recent Labs Lab 05/10/17 1642  GLUCAP 108*   Lipid Profile: No results for input(s): CHOL, HDL, LDLCALC, TRIG, CHOLHDL, LDLDIRECT in the last 72 hours. Thyroid Function Tests: No results for input(s): TSH, T4TOTAL, FREET4, T3FREE, THYROIDAB in the last 72 hours. Anemia Panel: No results for input(s): VITAMINB12, FOLATE, FERRITIN, TIBC, IRON, RETICCTPCT in the last 72 hours. Sepsis Labs: No results for input(s): PROCALCITON, LATICACIDVEN in the last 168 hours.  Recent Results (from the past 240 hour(s))  Culture, Urine     Status: None   Collection Time: 05/05/17  5:00 PM  Result Value Ref Range Status   Specimen Description URINE, RANDOM  Final   Special Requests NONE  Final    Culture   Final    NO GROWTH Performed at [facility], [address] [place], [place] [address]    Report Status 05/07/2017 FINAL  Final         Radiology Studies: No results found.      Scheduled Meds: . buPROPion  150 mg Oral Daily  . carvedilol  25 mg Oral BID  . citalopram  20 mg Oral QHS  . feeding supplement (ENSURE ENLIVE)  237 mL Oral BID BM  . nicotine  21 mg Transdermal Daily   Continuous Infusions: . sodium chloride       LOS: 4 days      Mauricio Gerome Apley, MD [facility] Pager [phone]  If 7PM-7AM, please contact night-coverage [email or web address] Password [email or web address] 05/11/2017, 9:11 AM

## 2017-05-11 NOTE — Care Management Important Message (Signed)
Important Message  Patient Details  Name: Carl Murphy MRN: [id number] Date of Birth: 1946/02/28   Medicare Important Message Given:  Yes    Kerin Salen 05/11/2017, 10:07 AMImportant Message  Patient Details  Name: Carl Murphy MRN: [id number] Date of Birth: 06/12/1946   Medicare Important Message Given:  Yes    Kerin Salen 05/11/2017, 10:05 AM

## 2017-05-11 NOTE — Anesthesia Postprocedure Evaluation (Signed)
Anesthesia Post Note  Patient: Carl Murphy  Procedure(s) Performed: Procedure(s) (LRB): TRANSURETHRAL RESECTION OF BLADDER TUMOR (TURBT) CLOT EVACUATION (N/A)     Patient location during evaluation: PACU Anesthesia Type: Spinal Level of consciousness: awake and alert, oriented and patient cooperative Pain management: pain level controlled Vital Signs Assessment: post-procedure vital signs reviewed and stable Respiratory status: spontaneous breathing, nonlabored ventilation and respiratory function stable Cardiovascular status: blood pressure returned to baseline and stable Postop Assessment: spinal receding and no signs of nausea or vomiting Anesthetic complications: no    Last Vitals:  Vitals:   05/11/17 2000 05/11/17 2015  BP: (!) 149/77 139/71  Pulse: (!) 46 (!) 58  Resp: 13 12  Temp:      Last Pain:  Vitals:   05/11/17 1604  TempSrc: Oral  PainSc:                  [name],E. Taletha Twiford

## 2017-05-11 NOTE — Op Note (Signed)
Preoperative diagnosis: Bladder cancer, recurrent with gross hematuria.  Postoperative diagnosis: Same  Principal procedure: Cystoscopy, clot evacuation, TURBT of 7 centimeter recurrent bladder tumor, attempted placement of right double-J stent.  Surgeon: Diona Fanti  Anesthesia: Subarachnoid block with bupivacaine.  Drains: 24 French three-way hematuria catheter to CBI.  Specimen: Bladder tumor fragments, to pathology  Estimated blood loss: Less than 25 mL  Indications: 71 year old male with progressive, recurrent bladder cancer.  The patient has not been considered an operative candidate with his muscle invasive bladder cancer.  He is status post chemotherapy/radiotherapy on the but still has recurrence with clot formation and progressive bleeding.  He presented last week with a hemoglobin less than 6.  He has been transfused, his bladder has been irrigated, and he presents at this time for cystoscopy and TURBT 2 take care of the recurrent bladder tumor, which is causing his bleeding and anemia.  Findings: The urethra was normal.  Bladder revealed pale urothelium.  Generally speaking throughout, but several areas, greater than 7 centimeters in diameter, revealed recurrent papillary nodular bladder cancer.  These were mainly in the right bladder wall, dome, and the left dome.  There was also some tumor in the anterior bladder area.  The right ureteral orifice was visible, but unable to be adequately cannulated with an open-ended catheter or a sensor-tip guidewire.  Description of procedure: The patient's right surgical site was marked preoperatively, he was identified, he received IV Ancef, and was then taken to the operating room where subarachnoid block was performed by Dr. Glennon Mac.  The patient was then placed in the dorsolithotomy position.  Genitalia and perineum were prepped and draped.  Proper timeout was performed.  A 26 French resectoscope sheath was passed with the visual obturator.   The urethra was normal.  The bladder was entered.  A large clot was seen.  This was readily evacuated using the Toomey syringe and saline.  Approximately 150 mL of clot was obtained.  Following this, the bladder was inspected.  The above-mentioned bladder tumors were seen.  Using the cutting loop and the monopolar current and water, his irrigant, the bladder tumors were resected down to a level with the surrounding normal bladder urothelium.  In some places.  A clot was adherent, and this was vigorously irrigated with a Toomey syringe so that the underlying urothelium could be seen.  Multiple areas were resected.  Following resection, all resected sites were cauterized with the coag current.  There was adequate hemostasis at this point.  The bladder tumor fragments were irrigated from the bladder, and sent for pathology labeled "bladder tumor".  At this point, the bladder was again inspected.  No further bleeding was seen.  No active tumor was seen either.  The resectoscope was changed for a bridge.  I tried, using both 6 [place] open-ended catheter and a sensor-tip guidewire past the guidewire into the right ureteral orifice.  This was impossible, as it can only be guided about 1 centimeter up.  Following this attempt, I decided not to press further with a retrograde study, the scope was removed.  I then replaced a 24 [place] three-way hematuria catheter.  This was placed in the bladder, the balloon filled with 30 mL of water, and then hooked to CBI.  This terminated the procedure.  Patient tolerated well.  He was then returned to the PACU in stable condition.

## 2017-05-11 NOTE — Anesthesia Procedure Notes (Addendum)
Spinal  Patient location during procedure: OR End time: 05/11/2017 4:01 PM Staffing Anesthesiologist: Annye Asa Performed: anesthesiologist  Preanesthetic Checklist Completed: patient identified, surgical consent, pre-op evaluation, timeout performed, IV checked, risks and benefits discussed and monitors and equipment checked Spinal Block Patient position: sitting Prep: ChloraPrep and site prepped and draped Patient monitoring: blood pressure, continuous pulse ox, cardiac monitor and heart rate Approach: midline Location: L3-4 Injection technique: single-shot Needle Needle type: Quincke  Needle gauge: 25 G Needle length: 9 cm Additional Notes Pt identified in Operating room.  Monitors applied. Working IV access confirmed. Sterile prep, drape lumbar spine.  1% lido local L 3,4.  #25ga Quincke into clear CSF L 3,4.  13.5 mg 0.75% Bupivacaine with dextrose injected with asp CSF beginning and end of injection.  Patient asymptomatic, VSS, no heme aspirated, tolerated well.  Jenita Seashore, MD

## 2017-05-12 ENCOUNTER — Encounter (HOSPITAL_COMMUNITY): Payer: Self-pay | Admitting: Urology

## 2017-05-12 LAB — BASIC METABOLIC PANEL
ANION GAP: 6 (ref 5–15)
BUN: 28 mg/dL — ABNORMAL HIGH (ref 6–20)
CALCIUM: 8 mg/dL — AB (ref 8.9–10.3)
CO2: 27 mmol/L (ref 22–32)
Chloride: 99 mmol/L — ABNORMAL LOW (ref 101–111)
Creatinine, Ser: 2.14 mg/dL — ABNORMAL HIGH (ref 0.61–1.24)
GFR calc Af Amer: 35 mL/min — ABNORMAL LOW (ref >60)
GFR calc non Af Amer: 30 mL/min — ABNORMAL LOW (ref >60)
GLUCOSE: 136 mg/dL — AB (ref 65–99)
Potassium: 5.7 mmol/L — ABNORMAL HIGH (ref 3.5–5.1)
Sodium: 132 mmol/L — ABNORMAL LOW (ref 135–145)

## 2017-05-12 LAB — CBC WITH DIFFERENTIAL/PLATELET
BASOS ABS: 0 10*3/uL (ref 0.0–0.1)
Basophils Relative: 0 %
Eosinophils Absolute: 0 10*3/uL (ref 0.0–0.7)
Eosinophils Relative: 0 %
HEMATOCRIT: 27.1 % — AB (ref 39.0–52.0)
Hemoglobin: 8.9 g/dL — ABNORMAL LOW (ref 13.0–17.0)
LYMPHS PCT: 4 %
Lymphs Abs: 0.2 10*3/uL — ABNORMAL LOW (ref 0.7–4.0)
MCH: 29.8 pg (ref 26.0–34.0)
MCHC: 32.8 g/dL (ref 30.0–36.0)
MCV: 90.6 fL (ref 78.0–100.0)
MONO ABS: 0.1 10*3/uL (ref 0.1–1.0)
MONOS PCT: 3 %
NEUTROS ABS: 4.3 10*3/uL (ref 1.7–7.7)
Neutrophils Relative %: 93 %
Platelets: 302 10*3/uL (ref 150–400)
RBC: 2.99 MIL/uL — ABNORMAL LOW (ref 4.22–5.81)
RDW: 17.2 % — AB (ref 11.5–15.5)
WBC: 4.6 10*3/uL (ref 4.0–10.5)

## 2017-05-12 MED ORDER — HYDROCHLOROTHIAZIDE 25 MG PO TABS: 25.0000 mg | ORAL_TABLET | Freq: Every day | ORAL | 0 refills | Status: DC

## 2017-05-12 MED ORDER — AMLODIPINE BESYLATE 5 MG PO TABS: 5.0000 mg | ORAL_TABLET | Freq: Every day | ORAL | 0 refills | Status: DC

## 2017-05-12 MED ORDER — BELLADONNA ALKALOIDS-OPIUM 16.2-60 MG RE SUPP: 1.0000 | Freq: Four times a day (QID) | RECTAL | 0 refills | Status: DC | PRN

## 2017-05-12 MED ORDER — OXYBUTYNIN CHLORIDE 5 MG PO TABS: 5.0000 mg | ORAL_TABLET | Freq: Four times a day (QID) | ORAL | 0 refills | Status: AC | PRN

## 2017-05-12 MED ORDER — OXYCODONE HCL 5 MG PO TABS: 5.0000 mg | ORAL_TABLET | Freq: Four times a day (QID) | ORAL | 0 refills | Status: DC | PRN

## 2017-05-12 NOTE — Progress Notes (Signed)
1 Day Post-Op Subjective: Patient reports suprapubic pressure. Hematuria resolved and CBI was discontinued. Potassium is 5.7 today up from 4.5  Objective: Vital signs in last 24 hours: Temp:  [97.3 F (36.3 C)-97.8 F (36.6 C)] 97.3 F (36.3 C) (06/12 0547) Pulse Rate:  [46-66] 63 (06/12 0547) Resp:  [12-20] 19 (06/12 0547) BP: (120-157)/(55-78) 157/65 (06/12 0547) SpO2:  [95 %-100 %] 98 % (06/12 0547)  Intake/Output from previous day: 06/11 0701 - 06/12 0700 In: 6140 [P.O.:240; I.V.:400] Out: 4960 [Urine:4950; Blood:10] Intake/Output this shift: Total I/O In: -  Out: 1900 [Urine:1900]  Physical Exam:  General:alert, cooperative and appears stated age GI: soft, non tender, normal bowel sounds, no palpable masses, no organomegaly, no inguinal hernia Male genitalia: not done Extremities: extremities normal, atraumatic, no cyanosis or edema  Lab Results:  Recent Labs  05/10/17 1558 05/11/17 0543 05/12/17 0726  HGB 7.6* 8.6* 8.9*  HCT 22.6* 25.9* 27.1*   BMET  Recent Labs  05/11/17 0543 05/12/17 0726  NA 134* 132*  K 4.5 5.7*  CL 103 99*  CO2 23 27  GLUCOSE 92 136*  BUN 25* 28*  CREATININE 1.93* 2.14*  CALCIUM 7.5* 8.0*   No results for input(s): LABPT, INR in the last 72 hours. No results for input(s): LABURIN in the last 72 hours. Results for orders placed or performed during the hospital encounter of 05/07/17  Surgical pcr screen     Status: None   Collection Time: 05/11/17  8:37 AM  Result Value Ref Range Status   MRSA, PCR NEGATIVE NEGATIVE Final   Staphylococcus aureus NEGATIVE NEGATIVE Final    Comment:        The Xpert SA Assay (FDA approved for NASAL specimens in patients over 63 years of age), is one component of a comprehensive surveillance program.  Test performance has been validated by [facility] for patients greater than or equal to 72 year old. It is not intended to diagnose infection nor to guide or monitor treatment.      Studies/Results: No results found.  Assessment/Plan: POD#1 from TURBT  1. CBI discontinued. Patient will followup with Dr. Diona Fanti in 1 week for a voiding trial   LOS: 5 days   Nicolette Bang 05/12/2017, 11:47 AM

## 2017-05-12 NOTE — Discharge Summary (Signed)
Physician Discharge Summary  Carl Murphy KZS:[id number] DOB: [birth year] DOA: 05/07/2017  PCP: [name], MD  Admit date: 05/07/2017 Discharge date: 05/12/2017  Admitted From: Home  Disposition: Home   Recommendations for Outpatient Follow-up:  1. Follow up with PCP in 1-week 2. Aspirin held 3. Patient placed on as needed PO oxybutynin and rectal opium-belladonna for bladder spasms. 4. Pain control with as needed oxycodone 5. Patient will be discharged with the foley catheter and follow up with Urology as outpatient.  6. Discharge potassium 5.7, he should refusing to have level rechecked despite explaining him that losartan can trigger worsening hyperkalemia with significant cardiac consequences including cardiac arrest.  7. Will hold losartan and continue oral therapy with hydrochlorothiazide and will add amlodipine.  Home Health: No  Equipment/Devices: Foley catheter  Discharge Condition: Stable  CODE STATUS: Full  Diet recommendation: Heart Healthy  Brief/Interim Summary: 71 year old male who presented to the hospital with a chief complaint of hematuria. Patient known to have bladder cancer, worsening symptoms over last 5 days prior to hospitalization. June 5 received Foley catheter due to urinary obstruction, blood work showed significant anemia, he was referred to the ED for further evaluation and treatment. On initial physical examination, hemodynamically stable, no pallor or icterus, lungs were clear to auscultation, heart S1-S2 present rhythmic, soft abdomen, no lower extremity edema. Sodium 133, potassium 4.8, chloride 102, bicarbonate 20, glucose of 103, BUN 43, creatinine 2.87, white count 6.5, hemoglobin 5.9, hematocrit 18, platelets 346.   Patient was admitted with working diagnosis of acute blood loss anemia due to hematuria.   1. Blood loss anemia due to hematuria.  Patient was placed on continuous bladder irrigation, persistent severe hematuria, he required transfusion of  5 units of packed red blood cells. Patient was seen by urology, his bladder irrigation was discontinued on 06/10, and he was taken to the OR (06/11), for cystoscopy, clot evacuation, TURBT of 7 cm recurrent bladder tumor, attempted placement of right double-J stent. Hematuria has resolved, discharge hemoglobin is 8.9 with hematocrit of 27.1. Patient will be discharged home with a Foley catheter in place, instructions to follow-up with urology within 7 days. Aspirin has been discontinued.   2. Acute kidney injury on chronic kidney disease stage III, calculated basal GFR 42, right hydronephrosis. Patient received IV fluids, blood transfusion, with improvement of the kidney function. Ultrasonography of the kidneys showed moderate right hydronephrosis. Discharge creatinine 2.14  3. COPD with tobacco abuse. Patient was placed on bronchodilator therapy as needed, oximetry monitoring, no signs of acute exacerbation. Nicotine patch was placed, aggressive smoking cessation counseling.  4. Hypertension. Patient was continued on Coreg, he will resume hydrochlorothiazide by the time of discharge and will add amlodipine 5 mg daily. Holding losartan due to risk of worsening hyperkalemia.   5. Depression. Continue bupropion and citalopram.  Discharge Diagnoses:  Principal Problem:   Acute blood loss anemia Active Problems:   Malignant neoplasm of trigone of bladder (HCC)   Gross hematuria   AKI (acute kidney injury) ([place])   COPD (chronic obstructive pulmonary disease) (HCC)   Tobacco dependence    Discharge Instructions   Allergies as of 05/12/2017   No Known Allergies     Medication List    STOP taking these medications   aspirin 325 MG tablet   cephALEXin 500 MG capsule Commonly known as:  KEFLEX   losartan-hydrochlorothiazide 100-25 MG tablet Commonly known as:  HYZAAR   ondansetron 4 MG tablet Commonly known as:  ZOFRAN  TAKE these medications   amLODipine 5 MG tablet Commonly  known as:  NORVASC Take 1 tablet (5 mg total) by mouth daily.   buPROPion 150 MG 24 hr tablet Commonly known as:  WELLBUTRIN XL Take 150 mg by mouth daily.   carvedilol 25 MG tablet Commonly known as:  COREG Take 25 mg by mouth 2 (two) times daily.   citalopram 20 MG tablet Commonly known as:  CELEXA Take 20 mg by mouth at bedtime.   fluticasone 50 MCG/ACT nasal spray Commonly known as:  FLONASE Place 2 sprays into both nostrils daily as needed for allergies or rhinitis.   hydrochlorothiazide 25 MG tablet Commonly known as:  HYDRODIURIL Take 1 tablet (25 mg total) by mouth daily.   loratadine 10 MG tablet Commonly known as:  CLARITIN Take 10 mg by mouth daily as needed for allergies.   LUBRICANT EYE DROPS 0.4-0.3 % Soln Generic drug:  Polyethyl Glycol-Propyl Glycol Place 1-2 drops into both eyes 3 (three) times daily as needed (for dry/irritated eyes).   opium-belladonna 16.2-60 MG suppository Commonly known as:  B&O SUPPRETTES Place 1 suppository rectally every 6 (six) hours as needed for bladder spasms.   oxybutynin 5 MG tablet Commonly known as:  DITROPAN Take 1 tablet (5 mg total) by mouth every 6 (six) hours as needed for bladder spasms.   oxyCODONE 5 MG immediate release tablet Commonly known as:  Oxy IR/ROXICODONE Take 1 tablet (5 mg total) by mouth every 6 (six) hours as needed. For pain.       No Known Allergies  Consultations:  Urology    Procedures/Studies: US Renal  Result Date: 05/08/2017 CLINICAL DATA:  Acute kidney injury EXAM: RENAL / URINARY TRACT ULTRASOUND COMPLETE COMPARISON:  CT 04/07/2017 FINDINGS: Right Kidney: Length: 11.4 cm. Increased cortical echogenicity. Moderate hydronephrosis may be slightly decreased compared to prior CT. Small cyst mid to lower pole right kidney measuring 0.9 x 0.8 x 1 cm. Left Kidney: Length: 12.2 cm. Increased cortical echogenicity. No hydronephrosis. Echogenic foci in the mid and lower pole may represent  stones, the larger in the midpole measures 8 mm. A Foley catheter is present in the bladder which is empty. Bladder: Appears normal for degree of bladder distention. IMPRESSION: 1. Moderate right hydronephrosis, may be slightly decreased compared to prior CT allowing for limitations of inter modality comparison. CT demonstrated lower pole stone on the right not well seen on sonography. Small cyst in the midpole. 2. Increased cortical echogenicity bilaterally consistent with medical renal disease 3. Shadowing echogenic foci in the mid and lower kidney measuring up to 8 mm consistent with small stones 4. Limited assessment of bladder due to Foley catheter Electronically Signed   By: Donavan Foil M.D.   On: 05/08/2017 17:08       Subjective: Patient feeling better, dyspnea at baseline, no nausea or vomiting, no chest pain or abdominal pain, urine clear with no signs of macroscopic bleeding.   Discharge Exam: Vitals:   05/11/17 2045 05/12/17 0547  BP: (!) 151/68 (!) 157/65  Pulse: (!) 51 63  Resp: 20 19  Temp: 97.8 F (36.6 C) 97.3 F (36.3 C)   Vitals:   05/11/17 2000 05/11/17 2015 05/11/17 2045 05/12/17 0547  BP: (!) 149/77 139/71 (!) 151/68 (!) 157/65  Pulse: (!) 46 (!) 58 (!) 51 63  Resp: 13 12 20 19   Temp:   97.8 F (36.6 C) 97.3 F (36.3 C)  TempSrc:    Oral  SpO2: 97% 95% 98%  98%  Weight:      Height:        General: Pt is alert, awake, not in acute distress E ENT: mild pallor, no icterus, oral mucosa moist.  Cardiovascular: RRR, S1/S2 +, no rubs, no gallops Respiratory: CTA bilaterally, no wheezing, no rhonchi Abdominal: Soft, NT, ND, bowel sounds + Extremities: no edema, no cyanosis    The results of significant diagnostics from this hospitalization (including imaging, microbiology, ancillary and laboratory) are listed below for reference.     Microbiology: Recent Results (from the past 240 hour(s))  Culture, Urine     Status: None   Collection Time: 05/05/17   5:00 PM  Result Value Ref Range Status   Specimen Description URINE, RANDOM  Final   Special Requests NONE  Final   Culture   Final    NO GROWTH Performed at [facility], [address] [place], [place] [address]    Report Status 05/07/2017 FINAL  Final  Surgical pcr screen     Status: None   Collection Time: 05/11/17  8:37 AM  Result Value Ref Range Status   MRSA, PCR NEGATIVE NEGATIVE Final   Staphylococcus aureus NEGATIVE NEGATIVE Final    Comment:        The Xpert SA Assay (FDA approved for NASAL specimens in patients over 25 years of age), is one component of a comprehensive surveillance program.  Test performance has been validated by [facility] for patients greater than or equal to 47 year old. It is not intended to diagnose infection nor to guide or monitor treatment.      Labs: BNP (last 3 results) No results for input(s): BNP in the last 8760 hours. Basic Metabolic Panel:  Recent Labs Lab 05/08/17 0548 05/09/17 0714 05/10/17 0535 05/11/17 0543 05/12/17 0726  NA 137 137 136 134* 132*  K 4.2 4.5 4.4 4.5 5.7*  CL 108 108 105 103 99*  CO2 23 21* 26 23 27   GLUCOSE 90 102* 116* 92 136*  BUN 35* 24* 27* 25* 28*  CREATININE 2.26* 2.06* 2.00* 1.93* 2.14*  CALCIUM 7.7* 7.8* 7.8* 7.5* 8.0*   Liver Function Tests: No results for input(s): AST, ALT, ALKPHOS, BILITOT, PROT, ALBUMIN in the last 168 hours. No results for input(s): LIPASE, AMYLASE in the last 168 hours. No results for input(s): AMMONIA in the last 168 hours. CBC:  Recent Labs Lab 05/07/17 1012 05/08/17 0548 05/09/17 0714 05/10/17 0535 05/10/17 1558 05/11/17 0543 05/12/17 0726  WBC 6.5 4.1 6.8 5.1  --  4.4 4.6  NEUTROABS 5.6  --  5.7 3.7  --  3.1 4.3  HGB 5.9* 7.6* 10.1* 8.3* 7.6* 8.6* 8.9*  HCT 18.0* 22.4* 30.5* 24.9* 22.6* 25.9* 27.1*  MCV 98.4 93.3 92.1 91.9  --  89.9 90.6  PLT 346 263 275 284  --  271 302   Cardiac Enzymes: No results for input(s): CKTOTAL, CKMB,  CKMBINDEX, TROPONINI in the last 168 hours. BNP: Invalid input(s): POCBNP CBG:  Recent Labs Lab 05/10/17 1642  GLUCAP 108*   D-Dimer No results for input(s): DDIMER in the last 72 hours. Hgb A1c No results for input(s): HGBA1C in the last 72 hours. Lipid Profile No results for input(s): CHOL, HDL, LDLCALC, TRIG, CHOLHDL, LDLDIRECT in the last 72 hours. Thyroid function studies No results for input(s): TSH, T4TOTAL, T3FREE, THYROIDAB in the last 72 hours.  Invalid input(s): FREET3 Anemia work up No results for input(s): VITAMINB12, FOLATE, FERRITIN, TIBC, IRON, RETICCTPCT in the last  72 hours. Urinalysis No results found for: COLORURINE, APPEARANCEUR, [place], [place], [place], [place], [place], [place], PROTEINUR, UROBILINOGEN, NITRITE, LEUKOCYTESUR Sepsis Labs Invalid input(s): PROCALCITONIN,  WBC,  LACTICIDVEN Microbiology Recent Results (from the past 240 hour(s))  Culture, Urine     Status: None   Collection Time: 05/05/17  5:00 PM  Result Value Ref Range Status   Specimen Description URINE, RANDOM  Final   Special Requests NONE  Final   Culture   Final    NO GROWTH Performed at [facility], [address] [place], [place] [address]    Report Status 05/07/2017 FINAL  Final  Surgical pcr screen     Status: None   Collection Time: 05/11/17  8:37 AM  Result Value Ref Range Status   MRSA, PCR NEGATIVE NEGATIVE Final   Staphylococcus aureus NEGATIVE NEGATIVE Final    Comment:        The Xpert SA Assay (FDA approved for NASAL specimens in patients over 59 years of age), is one component of a comprehensive surveillance program.  Test performance has been validated by [facility] for patients greater than or equal to 55 year old. It is not intended to diagnose infection nor to guide or monitor treatment.      Time coordinating discharge: Over 45 minutes  SIGNED:   Tawni Millers, MD  [facility] 05/12/2017, 10:45 AM Pager    If 7PM-7AM, please contact night-coverage [email or web address] Password [email or web address]

## 2017-05-12 NOTE — Care Management Note (Signed)
Case Management Note  Patient Details  Name: KWALI WRINKLE MRN: [id number] Date of Birth: Feb 04, 1946  Subjective/Objective:71 y/o m admitted w/Bladder Ca. POD#1 cystoscopy/TURBT. From home.                    Action/Plan:d/c home.   Expected Discharge Date:   (unknown)               Expected Discharge Plan:  Home/Self Care  In-House Referral:     Discharge planning Services  CM Consult  Post Acute Care Choice:    Choice offered to:     DME Arranged:    DME Agency:     HH Arranged:    HH Agency:     Status of Service:  In process, will continue to follow  If discussed at Long Length of Stay Meetings, dates discussed:    Additional Comments:  Dessa Phi, RN 05/12/2017, 11:44 AM

## 2017-05-19 ENCOUNTER — Ambulatory Visit (INDEPENDENT_AMBULATORY_CARE_PROVIDER_SITE_OTHER): Payer: Medicare Other | Admitting: Urology

## 2017-05-19 DIAGNOSIS — N130 Hydronephrosis with ureteropelvic junction obstruction: Secondary | ICD-10-CM | POA: Diagnosis not present

## 2017-05-19 DIAGNOSIS — C678 Malignant neoplasm of overlapping sites of bladder: Secondary | ICD-10-CM

## 2017-05-21 ENCOUNTER — Encounter: Payer: Self-pay | Admitting: *Deleted

## 2017-05-27 ENCOUNTER — Ambulatory Visit (HOSPITAL_BASED_OUTPATIENT_CLINIC_OR_DEPARTMENT_OTHER): Payer: Medicare Other | Admitting: Oncology

## 2017-05-27 ENCOUNTER — Other Ambulatory Visit (HOSPITAL_COMMUNITY): Payer: Self-pay | Admitting: Adult Health

## 2017-05-27 ENCOUNTER — Telehealth: Payer: Self-pay | Admitting: Oncology

## 2017-05-27 VITALS — BP 119/55 | HR 73 | Temp 98.4°F | Resp 17 | Ht 68.0 in | Wt 168.9 lb

## 2017-05-27 DIAGNOSIS — C670 Malignant neoplasm of trigone of bladder: Secondary | ICD-10-CM | POA: Diagnosis not present

## 2017-05-27 DIAGNOSIS — N289 Disorder of kidney and ureter, unspecified: Secondary | ICD-10-CM

## 2017-05-27 DIAGNOSIS — Z720 Tobacco use: Secondary | ICD-10-CM

## 2017-05-27 NOTE — Telephone Encounter (Signed)
No appts scheduled patient referred back to [facility] . [facility] to contact patient with CT.

## 2017-05-27 NOTE — Progress Notes (Signed)
Reason for Referral: Bladder cancer.   HPI: 71 year old gentleman currently of [place] where he lived the majority of his life. 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 He underwent a TURBT on April 2017 which showed muscle invasive disease. He was felt not to be a cystectomy candidate and underwent chemotherapy and radiation under the care of Dr. Whitney Muse that was completed in October 2017. He received carboplatin as a radiosensitizer. He tolerated therapy well and have been followed with Dr. Diona Fanti periodically. He had recurrent disease with high-grade invasive disease with muscle involvement. I was detected in January 2018 and in 05/11/2017. He was hospitalized in June 2018 with hematuria and urinary obstruction. He had a Foley catheter placed and underwent a TURBT of a 7 cm recurrent bladder tumor and attempted placement of a right double-J stent. The final pathology as mentioned showed muscle invasive disease.  If his last CT scan is in May 2018 showed a recurrent tumor with involvement of the distal ureter. Lymphadenopathy noted at that time. Since his discharge, he has been improving slowly but have not fully recovered. He does report some lower extremity edema but no shortness of breath or difficulty breathing. He continues to smoke heavily. He denied any hematuria at this time.  He does not report any headaches, blurry vision, syncope or seizures. He does not report any fevers or chills or sweats. He does not report any cough, wheezing or hemoptysis. He does not report any nausea, vomiting or abdominal pain. He does not report any frequency urgency or hesitancy. He does not report a skeletal complaints. Remaining review of systems unremarkable.   Past Medical History:  Diagnosis Date  . Arthritis   . Cancer [facility])    Bladder Cancer  . CHF (congestive heart  failure) ([place])   . COPD (chronic obstructive pulmonary disease) ([place])   . Depression   . Hard of hearing    bilat   . Hypertension   . Malignant neoplasm of trigone of bladder ([place]) 03/25/2016  . PONV (postoperative nausea and vomiting)   . Shortness of breath dyspnea    heat; walking up set of stairs; pt states can walk up 12-14 stairs slowly without having to stop to catch his breath  :  Past Surgical History:  Procedure Laterality Date  . CYSTOSCOPY N/A 06/16/2016   Procedure: CYSTOSCOPY;  Surgeon: Franchot Gallo, MD;  Location: [facility];  Service: Urology;  Laterality: N/A;  . TRANSURETHRAL RESECTION OF BLADDER TUMOR N/A 03/25/2016   Procedure: TRANSURETHRAL RESECTION OF BLADDER TUMOR (TURBT);  Surgeon: Franchot Gallo, MD;  Location: [facility];  Service: Urology;  Laterality: N/A;  . TRANSURETHRAL RESECTION OF BLADDER TUMOR N/A 06/16/2016   Procedure: TRANSURETHRAL RESECTION OF BLADDER TUMOR (TURBT);  Surgeon: Franchot Gallo, MD;  Location: [facility];  Service: Urology;  Laterality: N/A;  . TRANSURETHRAL RESECTION OF BLADDER TUMOR N/A 12/16/2016   Procedure: TRANSURETHRAL RESECTION OF BLADDER TUMOR (TURBT);  Surgeon: Franchot Gallo, MD;  Location: [facility];  Service: Urology;  Laterality: N/A;  . TRANSURETHRAL RESECTION OF BLADDER TUMOR N/A 05/11/2017   Procedure: TRANSURETHRAL RESECTION OF BLADDER TUMOR (TURBT) CLOT EVACUATION;  Surgeon: Franchot Gallo, MD;  Location: [facility];  Service: Urology;  Laterality: N/A;  1 HR [phone] BLUE [id number]  :   Current Outpatient Prescriptions:  .  buPROPion (WELLBUTRIN XL) 150 MG 24 hr tablet, Take 150 mg by  mouth daily. , Disp: , Rfl: 2 .  carvedilol (COREG) 25 MG tablet, Take 25 mg by mouth 2 (two) times daily. , Disp: , Rfl: 11 .  citalopram (CELEXA) 20 MG tablet, Take 20 mg by mouth at bedtime. , Disp: , Rfl: 11 .  fluticasone (FLONASE) 50 MCG/ACT nasal spray, Place 2 sprays into both nostrils daily as needed for  allergies or rhinitis., Disp: , Rfl:  .  furosemide (LASIX) 40 MG tablet, , Disp: , Rfl: 1 .  hydrochlorothiazide (HYDRODIURIL) 25 MG tablet, Take 1 tablet (25 mg total) by mouth daily., Disp: 30 tablet, Rfl: 0 .  loratadine (CLARITIN) 10 MG tablet, Take 10 mg by mouth daily as needed for allergies., Disp: , Rfl:  .  oxybutynin (DITROPAN) 5 MG tablet, Take 1 tablet (5 mg total) by mouth every 6 (six) hours as needed for bladder spasms., Disp: 20 tablet, Rfl: 0 .  Polyethyl Glycol-Propyl Glycol (LUBRICANT EYE DROPS) 0.4-0.3 % SOLN, Place 1-2 drops into both eyes 3 (three) times daily as needed (for dry/irritated eyes)., Disp: , Rfl:  .  traMADol (ULTRAM) 50 MG tablet, , Disp: , Rfl: 0 .  amLODipine (NORVASC) 5 MG tablet, Take 1 tablet (5 mg total) by mouth daily. (Patient not taking: Reported on 05/27/2017), Disp: 30 tablet, Rfl: 0 .  opium-belladonna (B&O SUPPRETTES) 16.2-60 MG suppository, Place 1 suppository rectally every 6 (six) hours as needed for bladder spasms. (Patient not taking: Reported on 05/27/2017), Disp: 15 suppository, Rfl: 0 .  oxyCODONE (OXY IR/ROXICODONE) 5 MG immediate release tablet, Take 1 tablet (5 mg total) by mouth every 6 (six) hours as needed. For pain. (Patient not taking: Reported on 05/27/2017), Disp: 10 tablet, Rfl: 0:  No Known Allergies:  Family History  Problem Relation Age of Onset  . Hypertension Mother   . Asthma Father   . Emphysema Father   :  Social History   Social History  . Marital status: Married    Spouse name: N/A  . Number of children: N/A  . Years of education: N/A   Occupational History  . Not on file.   Social History Main Topics  . Smoking status: Current Every Day Smoker    Packs/day: 2.00    Years: 50.00    Types: Cigarettes  . Smokeless tobacco: Never Used  . Alcohol use No     Comment: occasionally  . Drug use: No  . Sexual activity: No   Other Topics Concern  . Not on file   Social History Narrative  . No narrative on  file  :  Pertinent items are noted in HPI.  Exam: Blood pressure (!) 119/55, pulse 73, temperature 98.4 F (36.9 C), temperature source Oral, resp. rate 17, height 5\' 8"  (1.727 m), weight 168 lb 14.4 oz (76.6 kg), SpO2 98 %. General appearance: alert and cooperative Throat: No oral thrush or ulcers. Neck: no adenopathy Back: negative Resp: clear to auscultation bilaterally Cardio: regular rate and rhythm, S1, S2 normal, no murmur, click, rub or gallop GI: soft, non-tender; bowel sounds normal; no masses,  no organomegaly Extremities: Slight edema noted bilaterally. Pulses: 2+ and symmetric Skin: Skin color, texture, turgor normal. No rashes or lesions  CBC    Component Value Date/Time   WBC 4.6 05/12/2017 0726   RBC 2.99 (L) 05/12/2017 0726   HGB 8.9 (L) 05/12/2017 0726   HCT 27.1 (L) 05/12/2017 0726   PLT 302 05/12/2017 0726   MCV 90.6 05/12/2017 0726   MCH 29.8  05/12/2017 0726   MCHC 32.8 05/12/2017 0726   RDW 17.2 (H) 05/12/2017 0726   LYMPHSABS 0.2 (L) 05/12/2017 0726   MONOABS 0.1 05/12/2017 0726   EOSABS 0.0 05/12/2017 0726   BASOSABS 0.0 05/12/2017 0726     US Renal  Result Date: 05/08/2017 CLINICAL DATA:  Acute kidney injury EXAM: RENAL / URINARY TRACT ULTRASOUND COMPLETE COMPARISON:  CT 04/07/2017 FINDINGS: Right Kidney: Length: 11.4 cm. Increased cortical echogenicity. Moderate hydronephrosis may be slightly decreased compared to prior CT. Small cyst mid to lower pole right kidney measuring 0.9 x 0.8 x 1 cm. Left Kidney: Length: 12.2 cm. Increased cortical echogenicity. No hydronephrosis. Echogenic foci in the mid and lower pole may represent stones, the larger in the midpole measures 8 mm. A Foley catheter is present in the bladder which is empty. Bladder: Appears normal for degree of bladder distention. IMPRESSION: 1. Moderate right hydronephrosis, may be slightly decreased compared to prior CT allowing for limitations of inter modality comparison. CT demonstrated  lower pole stone on the right not well seen on sonography. Small cyst in the midpole. 2. Increased cortical echogenicity bilaterally consistent with medical renal disease 3. Shadowing echogenic foci in the mid and lower kidney measuring up to 8 mm consistent with small stones 4. Limited assessment of bladder due to Foley catheter Electronically Signed   By: Donavan Foil M.D.   On: 05/08/2017 17:08    Assessment and Plan:   71 year old gentleman with the following issues:  1. Recurrent muscle invasive bladder cancer diagnosed in April 2018. He underwent a TURBT and a definitive treatment with radiation and single agent carboplatin as a radiosensitizer. Therapy completed in October 2017. He developed recurrent disease based on a TURBT in January 2018 and June 2018. He had a 7 cm tumor removed at the time close to the distal ureter.  The natural course of this disease was discussed today with the patient and his daughter. His disease appears to be incurable at this time given the fact that had recurred despite definitive treatment. Primary surgical therapy is not an option for him at this time given his comorbidities including congestive heart failure and COPD. Any treatment at this time would be palliative and that is emphasized in today's visit.  He has received radiation therapy in the past and repeat radiation is unlikely at this time. Systemic chemotherapy is his only option to palliate his disease moving forward.  My recommendation at this point is to repeat staging workup in the immediate future including a PET/CT scan. If he has bulky disease outside of the bladder including lung metastasis or lymphadenopathy, I would recommend gemcitabine and carboplatin combination as a palliative approach. He is not a cis-platinum candidate.  If his PET scan showed no disease outside of the bladder with minimal disease residual within the bladder, it would be reasonable to use single agent immunotherapy such  as Pembrolizumab to control his disease long term. He understands both those options are palliative at this time.  The logistics of systemic therapy administration was discussed with the patient. For convenience, he would benefit from treatment close to home at [facility] where he was treated in the past. I will make a referral for an appointment in the future with the plan outlined above.  2. Renal insufficiency: May be exacerbated because of his tumor. His baseline creatinine clearance around 30 mL/m.  3. Follow-up: I'm happy to see him in the future as needed.

## 2017-06-10 ENCOUNTER — Ambulatory Visit (HOSPITAL_COMMUNITY)
Admission: RE | Admit: 2017-06-10 | Discharge: 2017-06-10 | Disposition: A | Discharge status: Home / Self Care | Payer: Medicare Other | Source: Ambulatory Visit | Attending: Adult Health | Admitting: Adult Health

## 2017-06-10 DIAGNOSIS — I700 Atherosclerosis of aorta: Secondary | ICD-10-CM | POA: Insufficient documentation

## 2017-06-10 DIAGNOSIS — J432 Centrilobular emphysema: Secondary | ICD-10-CM | POA: Insufficient documentation

## 2017-06-10 DIAGNOSIS — R918 Other nonspecific abnormal finding of lung field: Secondary | ICD-10-CM | POA: Insufficient documentation

## 2017-06-10 DIAGNOSIS — I2510 Atherosclerotic heart disease of native coronary artery without angina pectoris: Secondary | ICD-10-CM | POA: Diagnosis not present

## 2017-06-10 DIAGNOSIS — C670 Malignant neoplasm of trigone of bladder: Secondary | ICD-10-CM | POA: Diagnosis present

## 2017-06-10 LAB — GLUCOSE, CAPILLARY: Glucose-Capillary: 100 mg/dL — ABNORMAL HIGH (ref 65–99)

## 2017-06-10 MED ORDER — FLUDEOXYGLUCOSE F - 18 (FDG) INJECTION
8.8000 | Freq: Once | INTRAVENOUS | Status: AC | PRN
  Administered 2017-06-10: 8.8 via INTRAVENOUS

## 2017-06-11 ENCOUNTER — Encounter (HOSPITAL_COMMUNITY): Payer: Self-pay

## 2017-06-11 ENCOUNTER — Encounter (HOSPITAL_COMMUNITY): Payer: Medicare Other

## 2017-06-11 ENCOUNTER — Encounter (HOSPITAL_COMMUNITY): Payer: Medicare Other | Attending: Oncology | Admitting: Oncology

## 2017-06-11 VITALS — BP 158/67 | HR 73 | Resp 18 | Ht 69.0 in | Wt 169.0 lb

## 2017-06-11 DIAGNOSIS — C670 Malignant neoplasm of trigone of bladder: Secondary | ICD-10-CM | POA: Diagnosis not present

## 2017-06-11 DIAGNOSIS — C7800 Secondary malignant neoplasm of unspecified lung: Secondary | ICD-10-CM | POA: Diagnosis not present

## 2017-06-11 DIAGNOSIS — Z720 Tobacco use: Secondary | ICD-10-CM | POA: Diagnosis not present

## 2017-06-11 DIAGNOSIS — N1330 Unspecified hydronephrosis: Secondary | ICD-10-CM

## 2017-06-11 LAB — CBC WITH DIFFERENTIAL/PLATELET
BASOS ABS: 0 10*3/uL (ref 0.0–0.1)
BASOS PCT: 0 %
EOS ABS: 0.1 10*3/uL (ref 0.0–0.7)
Eosinophils Relative: 2 %
HEMATOCRIT: 30.6 % — AB (ref 39.0–52.0)
HEMOGLOBIN: 9.7 g/dL — AB (ref 13.0–17.0)
Lymphocytes Relative: 9 %
Lymphs Abs: 0.5 10*3/uL — ABNORMAL LOW (ref 0.7–4.0)
MCH: 29.8 pg (ref 26.0–34.0)
MCHC: 31.7 g/dL (ref 30.0–36.0)
MCV: 93.9 fL (ref 78.0–100.0)
MONOS PCT: 10 %
Monocytes Absolute: 0.6 10*3/uL (ref 0.1–1.0)
NEUTROS ABS: 4.6 10*3/uL (ref 1.7–7.7)
NEUTROS PCT: 79 %
Platelets: 369 10*3/uL (ref 150–400)
RBC: 3.26 MIL/uL — ABNORMAL LOW (ref 4.22–5.81)
RDW: 15.7 % — ABNORMAL HIGH (ref 11.5–15.5)
WBC: 5.8 10*3/uL (ref 4.0–10.5)

## 2017-06-11 LAB — COMPREHENSIVE METABOLIC PANEL
ALBUMIN: 3.2 g/dL — AB (ref 3.5–5.0)
ALK PHOS: 54 U/L (ref 38–126)
ALT: 8 U/L — ABNORMAL LOW (ref 17–63)
ANION GAP: 6 (ref 5–15)
AST: 17 U/L (ref 15–41)
BUN: 27 mg/dL — AB (ref 6–20)
CALCIUM: 8.7 mg/dL — AB (ref 8.9–10.3)
CO2: 26 mmol/L (ref 22–32)
Chloride: 105 mmol/L (ref 101–111)
Creatinine, Ser: 1.92 mg/dL — ABNORMAL HIGH (ref 0.61–1.24)
GFR calc Af Amer: 39 mL/min — ABNORMAL LOW (ref >60)
GFR calc non Af Amer: 34 mL/min — ABNORMAL LOW (ref >60)
GLUCOSE: 100 mg/dL — AB (ref 65–99)
POTASSIUM: 4.7 mmol/L (ref 3.5–5.1)
SODIUM: 137 mmol/L (ref 135–145)
Total Bilirubin: 0.6 mg/dL (ref 0.3–1.2)
Total Protein: 6.6 g/dL (ref 6.5–8.1)

## 2017-06-11 MED ORDER — ONDANSETRON HCL 8 MG PO TABS
8.0000 mg | ORAL_TABLET | Freq: Two times a day (BID) | ORAL | 1 refills | Status: DC | PRN
Start: 2017-06-11 — End: 2017-11-20

## 2017-06-11 MED ORDER — PROCHLORPERAZINE MALEATE 10 MG PO TABS: 10.0000 mg | ORAL_TABLET | Freq: Four times a day (QID) | ORAL | 1 refills | Status: DC | PRN

## 2017-06-11 MED ORDER — DEXAMETHASONE 4 MG PO TABS: 8.0000 mg | ORAL_TABLET | Freq: Every day | ORAL | 1 refills | Status: DC

## 2017-06-11 MED ORDER — LIDOCAINE-PRILOCAINE 2.5-2.5 % EX CREA: TOPICAL_CREAM | CUTANEOUS | 3 refills | Status: DC

## 2017-06-11 NOTE — Progress Notes (Signed)
[place]  PROGRESS NOTE  Patient Care Team: [name], MD as PCP - General (Internal Medicine)  CHIEF COMPLAINTS/PURPOSE OF CONSULTATION:   Oncology History   Per available records had bladder tumor removed but had extension into the muscularis propria. CT showed no extension beyond the bladder. Has seen 2 urologists in consultation. Adamant about not having a cystectomy and ileal conduit.     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 No findings for local spread of disease or metastatic disease      03/25/2016 Surgery    TURBT 3cm right trigonal bladder tumor. followed by intravesical epirubicin instillation      03/25/2016 Pathology Results    bladder TURBT infiltrative high grade papillary urothelial carcinoma, invades the mulcularis propria (detrusor) no LVI       04/14/2016 Miscellaneous    [name] consultation in [place] for consideration of cystectomy. patient also offered repeat resection with BCG, and CHEMO/XRT      06/17/2016 Pathology Results    TURB- high grade papillary urothelial carcinoma with focal stromal invasion       Radiation Therapy    Planned to be completed on 09/10/2016.      07/22/2016 -  Chemotherapy    The patient had palonosetron (ALOXI) injection 0.25 mg, 0.25 mg, Intravenous,  Once, 2 of 5 cycles Administration: 0.25 mg (07/28/2016)  CARBOplatin (PARAPLATIN) 160 mg in sodium chloride 0.9 % 100 mL chemo infusion, 160 mg (100 % of original dose 160.6 mg), Intravenous,  Once, 2 of 5 cycles Dose modification:   (original dose 160.6 mg, Cycle 1),   (original dose 160.6 mg, Cycle 1),   (original dose 160.6 mg, Cycle 1),   (original dose 160.6 mg, Cycle 2) Administration: 180 mg (07/28/2016)  for chemotherapy treatment.        04/07/2017 Relapse/Recurrence    CT Abd/pelvis: IMPRESSION: 1. Recurrent bladder carcinoma with  synchronous disease in the distal right ureter causing severe right hydronephrosis and decreased right renal function. 2. Bilateral renal stones. 3. Aortic atherosclerosis (ICD10-170.0). Coronary artery calcification. 4. Small periumbilical hernia contains a knuckle of unobstructed small bowel.       06/10/2017 PET scan    1. Metachronous urothelial carcinoma within the distal right ureter, as on prior CT. 2. No evidence of nodal metastasis. 3. Bilateral hypermetabolic pulmonary nodules, favoring pulmonary metastasis. Especially given emphysema, 1 or more primary bronchogenic carcinomas cannot be excluded but are felt less likely. 4. Coronary artery atherosclerosis. Aortic Atherosclerosis (ICD10-I70.0). 5. Bladder not well evaluated secondary to hypermetabolic urine within.       COPD Hypertensive cardiomyopathy  HISTORY OF PRESENTING ILLNESS:  Carl Murphy 71 y.o. male is here for ongoing follow-up of muscle invasive carcinoma of the bladder. He completed concurrent carbo/XRT. Patient has refused cystectomy.  Patient was hospitalized at [facility] from 05/07/17 through 05/12/17 for hematuria. Patient was placed on continuous bladder irrigation for persistent severe hematuria for which she was given 5 units PRBC transfusion. He was seen by his urologist who taken to the OR on 05/11/17 for cystoscopy, clot evaluation, TURBT 7 cm recurrent bladder tumor and placement of a right double J stent was attempted. Final pathology demonstrated a muscle invasive disease. His hemoglobin at the time of discharge was 8.9 g/dL. Patient underwent CT abdomen and pelvis with and without contrast on 04/07/17 which demonstrated recurrent bladder  carcinoma with synchronous disease in the distal right ureter causing severe right hydronephrosis and decreased right renal function. Dr. Diona Fanti had recommended for patient to undergo a right ureteral stent for his right hydronephrosis by IR, but patient has  refused so far. He was seen by Dr. Alen Blew, oncology at [facility], who recommended for patient to get a PET scans rule out metastatic disease and systemic chemotherapy for palliative treatment of his recurrent bladder cancer. Patient underwent a PET scan on 06/10/17 which demonstrated metasynchronous urothelial carcinoma within the distal right ureter, no evidence of nodal metastasis, bilateral hypermetabolic pulmonary nodules favoring pulmonary metastasis.  Today patient presents with his daughter. He states that he feels tired. He does not have energy to do things like he used to. He currently denies any hematuria. Denies any chest pain, shortness breath, abdominal pain, focal weakness. He continues to smoke 2 packs per day.    MEDICAL HISTORY:  Past Medical History:  Diagnosis Date  . Arthritis   . Cancer [facility])    Bladder Cancer  . CHF (congestive heart failure) ([place])   . COPD (chronic obstructive pulmonary disease) ([place])   . Depression   . Hard of hearing    bilat   . Hypertension   . Malignant neoplasm of trigone of bladder ([place]) 03/25/2016  . PONV (postoperative nausea and vomiting)   . Shortness of breath dyspnea    heat; walking up set of stairs; pt states can walk up 12-14 stairs slowly without having to stop to catch his breath    SURGICAL HISTORY: Past Surgical History:  Procedure Laterality Date  . CYSTOSCOPY N/A 06/16/2016   Procedure: CYSTOSCOPY;  Surgeon: Franchot Gallo, MD;  Location: [facility];  Service: Urology;  Laterality: N/A;  . TRANSURETHRAL RESECTION OF BLADDER TUMOR N/A 03/25/2016   Procedure: TRANSURETHRAL RESECTION OF BLADDER TUMOR (TURBT);  Surgeon: Franchot Gallo, MD;  Location: [facility];  Service: Urology;  Laterality: N/A;  . TRANSURETHRAL RESECTION OF BLADDER TUMOR N/A 06/16/2016   Procedure: TRANSURETHRAL RESECTION OF BLADDER TUMOR (TURBT);  Surgeon: Franchot Gallo, MD;  Location: [facility];  Service: Urology;  Laterality: N/A;  . TRANSURETHRAL RESECTION  OF BLADDER TUMOR N/A 12/16/2016   Procedure: TRANSURETHRAL RESECTION OF BLADDER TUMOR (TURBT);  Surgeon: Franchot Gallo, MD;  Location: [facility];  Service: Urology;  Laterality: N/A;  . TRANSURETHRAL RESECTION OF BLADDER TUMOR N/A 05/11/2017   Procedure: TRANSURETHRAL RESECTION OF BLADDER TUMOR (TURBT) CLOT EVACUATION;  Surgeon: Franchot Gallo, MD;  Location: [facility];  Service: Urology;  Laterality: N/A;  1 HR [phone] BLUE [id number]    SOCIAL HISTORY: Social History   Social History  . Marital status: Married    Spouse name: N/A  . Number of children: N/A  . Years of education: N/A   Occupational History  . Not on file.   Social History Main Topics  . Smoking status: Current Every Day Smoker    Packs/day: 2.00    Years: 50.00    Types: Cigarettes  . Smokeless tobacco: Never Used  . Alcohol use No     Comment: occasionally  . Drug use: No  . Sexual activity: No   Other Topics Concern  . Not on file   Social History Narrative  . 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 Hypertension Mother   . Asthma Father   . Emphysema Father     Both parents deceased Mom was early 52's when she died; he was around 80-something Dad had asthma and emphysema really bad; thinks his heart finally gave out He says his mother's "intestines broke" and it was "fast forward downhill from there."  He is an only child.   ALLERGIES:  has No Known Allergies.  MEDICATIONS:   Current Outpatient Prescriptions  Medication Sig Dispense Refill  . carvedilol (COREG) 25 MG tablet Take 25 mg by mouth 2 (two) times daily.   11  . citalopram (CELEXA) 20 MG tablet Take 20 mg by mouth at bedtime.   11  . fluticasone (FLONASE) 50 MCG/ACT nasal spray Place 2 sprays into both nostrils daily as needed for allergies or rhinitis.    . furosemide (LASIX) 40 MG tablet   1  . loratadine (CLARITIN) 10 MG tablet Take 10 mg by mouth daily as needed for allergies.    Marland Kitchen oxybutynin (DITROPAN) 5 MG tablet Take 1 tablet (5 mg total) by mouth every 6 (six) hours as needed for bladder spasms. 20 tablet 0  . dexamethasone (DECADRON) 4 MG tablet Take 2 tablets (8 mg total) by mouth daily. Start the day after carboplatin chemotherapy for 2 days. 30 tablet 1  . lidocaine-prilocaine (EMLA) cream Apply to affected area once (Patient not taking: Reported on 06/11/2017) 30 g 3  . ondansetron (ZOFRAN) 8 MG tablet Take 1 tablet (8 mg total) by mouth 2 (two) times daily as needed for refractory nausea / vomiting. Start on day 3 after carboplatin chemo. 30 tablet 1  . prochlorperazine (COMPAZINE) 10 MG tablet Take 1 tablet (10 mg total) by mouth every 6 (six) hours as needed (Nausea or vomiting). 30 tablet 1   No current facility-administered medications for this visit.    Review of Systems  Constitutional: Positive for malaise/fatigue. Negative for chills and fever.  HENT: Negative.   Eyes: Negative.   Respiratory: Negative for cough and shortness of breath.   Cardiovascular: Negative.  Negative for chest pain.  Gastrointestinal: Negative for constipation and diarrhea.  Genitourinary: Negative for dysuria and hematuria.  Musculoskeletal: Negative.   Skin: Negative.   Neurological: Negative.   Endo/Heme/Allergies: Negative.   Psychiatric/Behavioral: Negative.   All other systems reviewed and are negative. 14 point ROS was done and is otherwise as detailed above or in HPI    PHYSICAL  EXAMINATION: ECOG PERFORMANCE STATUS: 1 - Symptomatic but completely ambulatory  Vitals:   06/11/17 1019  BP: (!) 158/67  Pulse: 73  Resp: 18   Filed Weights   06/11/17 1019  Weight: 169 lb (76.7 kg)      Physical Exam  Constitutional: He is oriented to person, place, and time and well-developed, well-nourished, and in no distress. No distress.  HENT:  Head: Normocephalic and atraumatic.  Mouth/Throat: Oropharynx is clear and moist. No oropharyngeal exudate.  Eyes: Pupils are equal, round, and reactive to light. Conjunctivae and EOM are normal. Right eye exhibits no discharge. Left eye exhibits no discharge. No scleral icterus.  Neck: Normal range of motion. Neck supple. No tracheal deviation present. No  thyromegaly present.  Cardiovascular: Normal rate, regular rhythm and normal heart sounds.   No murmur heard. Pulmonary/Chest: Effort normal and breath sounds normal. No respiratory distress. He has no wheezes.  Bilateral inspiratory wheezing in all lung fields.  Abdominal: Soft. Bowel sounds are normal. He exhibits no distension and no mass. There is no tenderness. There is no rebound and no guarding.  Musculoskeletal: Normal range of motion. He exhibits no edema or tenderness.  Lymphadenopathy:    He has no cervical adenopathy.  Neurological: He is alert and oriented to person, place, and time. No cranial nerve deficit. Gait normal. Coordination normal.  Skin: Skin is warm and dry. No rash noted. He is not diaphoretic. No erythema.  Psychiatric: Mood, memory, affect and judgment normal.  Nursing note and vitals reviewed.   LABORATORY DATA:  I have reviewed the data as listed Lab Results  Component Value Date   WBC 5.8 06/11/2017   HGB 9.7 (L) 06/11/2017   HCT 30.6 (L) 06/11/2017   MCV 93.9 06/11/2017   PLT 369 06/11/2017   CMP     Component Value Date/Time   NA 137 06/11/2017 1029   K 4.7 06/11/2017 1029   CL 105 06/11/2017 1029   CO2 26 06/11/2017 1029    GLUCOSE 100 (H) 06/11/2017 1029   BUN 27 (H) 06/11/2017 1029   CREATININE 1.92 (H) 06/11/2017 1029   CALCIUM 8.7 (L) 06/11/2017 1029   PROT 6.6 06/11/2017 1029   ALBUMIN 3.2 (L) 06/11/2017 1029   AST 17 06/11/2017 1029   ALT 8 (L) 06/11/2017 1029   ALKPHOS 54 06/11/2017 1029   BILITOT 0.6 06/11/2017 1029   GFRNONAA 34 (L) 06/11/2017 1029   GFRAA 39 (L) 06/11/2017 1029     RADIOGRAPHIC STUDIES: I have personally reviewed the radiological images as listed and agreed with the findings in the report. CLINICAL DATA: Hematuria and frequent urination.  EXAM: CT ABDOMEN AND PELVIS WITHOUT AND WITH CONTRAST  TECHNIQUE: Multidetector CT imaging of the abdomen and pelvis was performed following the standard protocol before and following the bolus administration of intravenous contrast.  CONTRAST: 165mL OMNIPAQUE IOHEXOL 300 MG/ML SOLN  COMPARISON: CT scan 03/31/2013  FINDINGS: Lower chest: The lung bases are clear of acute process. No worrisome pulmonary lesions. No pleural effusion. The heart is normal in size. No pericardial effusion. Coronary artery calcifications are noted. Dense distal aortic calcifications are noted. The esophagus is grossly normal.  Hepatobiliary: Small low-attenuation liver lesions are stable and likely small cysts. Lesion and segment 8 is also stable and likely a hemangioma. No worrisome hepatic lesions or intrahepatic biliary dilatation. The gallbladder is normal. No common bile duct dilatation.  Pancreas: No mass, inflammation or ductal dilatation.  Spleen: Normal size. No focal lesions.  Adrenals/Urinary Tract: The adrenal glands are normal and stable.  Numerous left renal artery calcifications. I do not see any definite calculi in the left collecting system. Small left renal cysts are noted. The no right renal calculi or hydronephrosis. Both ureters are normal. No ureteral calculi. No significant collecting system abnormality on  the delayed images.  There is a large irregular bladder mass at on the right side. This demonstrates moderate contrast enhancement. It measures 3.2 x 3.0 cm. No ureteral obstruction.  Stomach/Bowel: The stomach, duodenum, small bowel and colon are unremarkable without oral contrast. No inflammatory changes, mass lesions or obstructive findings. The terminal ileum is normal. The appendix is normal. Moderate sigmoid diverticulosis without findings for acute diverticulitis.  Vascular/Lymphatic: Advanced aortic  and branch vessel atherosclerotic calcifications. No focal aneurysm or dissection. The major venous structures are patent.  No mesenteric or retroperitoneal mass or lymphadenopathy. Small scattered lymph nodes are noted. Small bilateral external iliac artery lymph nodes are stable. No pelvic lymphadenopathy. No inguinal adenopathy. The prostate gland and seminal vesicles are grossly normal. New line small  Small Periumbilical abdominal wall hernia containing fat and a small bowel loop. This has enlarged since prior CT scan.  Other: No subcutaneous lesions or ascites.  Musculoskeletal: No significant bony findings.  IMPRESSION: 1. 3.2 x 3.0 cm bladder mass consistent with transitional cell carcinoma. No findings for local spread of disease or metastatic disease. 2. No worrisome renal or ureteral lesions. 3. Small benign-appearing and stable hepatic lesions. 4. Advanced atherosclerotic calcifications involving the aorta and branch vessels. 5. Small periumbilical abdominal wall hernia containing fat and a small bowel loop.   Electronically Signed  By: Marijo Sanes M.D.  On: 02/18/2016 16:47   ASSESSMENT & PLAN:  Stage II (T2N0M0) Muscle invasive carcinoma of the trigone of the bladder, recurrent disease in 03/2017 with recurrent bladder tumor and new pulm mets. He now has Stage IV disease. Severe right hydronephrosis due to recurrent bladder carcinoma with  synchronous disease in the distal right ureter  Tobacco Abuse CKD Anemia Hypotension  I have reviewed patient's PET scan in detail with him and his daughter Rojelio Brenner today. I have discussed that now with the pulmonary metastasis, he has stage IV disease which means that he is incurable and all treatment from now will  be given on a palliative basis in order to control symptoms and some place patient in remission. I have discussed palliative chemotherapy with Carboplatin and Gemzar every 21 days in detail with the patient and his daughter today. I have gone over side effects of carboplatin and Gemzar and have provided him with reading material to take at home as well. I have discussed chemotherapy port placement for IV access. The patient states he wishes to think for a few days regarding whether he wants to receive palliative chemotherapy. He will get back to [place] on Monday 06/15/17 to let us know whether he would like to proceed, for now have placed all orders in anticipation that he will likely receive palliative chemotherapy, including IR consult for chemoport placement.  Again discussed patient's severe right hydronephrosis with him and in need to place right ureteral stent, however patient is still refusing to undergo the procedure. He states he can only deal with one thing at a time, and right now he wants to focus on whether he was sick take chemotherapy or not. I did discuss that without treatment of his hydronephrosis, over time he will lose renal function in the right kidney. Patient verbalized understanding.  Return to clinic in 3 weeks for follow-up.  This note was electronically signed.    Twana First, MD  06/11/2017 12:24 PM

## 2017-06-16 ENCOUNTER — Encounter (HOSPITAL_COMMUNITY): Payer: Self-pay | Admitting: Emergency Medicine

## 2017-06-16 NOTE — Progress Notes (Signed)
Chemotherapy teaching pulled together and appts made. 

## 2017-06-16 NOTE — Patient Instructions (Signed)
[place]   CHEMOTHERAPY INSTRUCTIONS  You have been diagnosed with Stage 4 Bladder Cancer.  We are going to treat you with carboplatin and gemzar. One cycle is every 21 days.  You will be treated on day 1 and day 8 every 21 days.  On day 1 you will be treated with carboplatin and gemzar and on day 8 you will be treated with gemzar.  This treatment is with palliative intent, which means you are treatable but not curable.   You will see the doctor regularly throughout treatment.  We monitor your lab work prior to every treatment.  The doctor monitors your response to treatment by the way you are feeling, your blood work, and scans periodically.  There will be wait times while you are here for treatment.  It will take about 30 minutes to 1 hour for your labs to result.  There will be a wait time while pharmacy mixes your medications.    You will receive the following the following pre-medications prior to each chemotherapy: Premeds: Aloxi - high powered nausea/vomiting prevention medication used for chemotherapy patients.  Dexamethasone - steroid - given to reduce the risk of you having an allergic type reaction to the chemotherapy. Dex can cause you to feel energized, nervous/anxious/jittery, make you have trouble sleeping, and/or make you feel hot/flushed in the face/neck and/or look pink/red in the face/neck. These side effects will pass as the Dex wears off. (takes 20 minutes to infuse)  Compazine:  Nausea pill pre-medication given on day 8 prior to Gemzar    POTENTIAL SIDE EFFECTS OF TREATMENT:  Carboplatin (Generic Name) Other Names: Paraplatin, CBDCA  About This Drug Carboplatin is a drug used to treat cancer. This drug is given in the vein (IV).  This drug will take 30 minutes to infuse.  Possible Side Effects (More Common) . Nausea and throwing up (vomiting). These symptoms may happen within a few hours after your treatment and may last up to 24  hours. Medicines are available to stop or lessen these side effects. . Bone marrow depression. This is a decrease in the number of white blood cells, red blood cells, and platelets. This may raise your risk of infection, make you tired and weak (fatigue), and raise your risk of bleeding. . Soreness of the mouth and throat. You may have red areas, white patches, or sores that hurt. . This drug may affect how your kidneys work. Your kidney function will be checked as needed. . Electrolyte changes. Your blood will be checked for electrolyte changes as needed.  Possible Side Effects (Less Common) . Hair loss. Some patients lose their hair on the scalp and body. You may notice your hair thinning seven to 14 days after getting this drug. . Effects on the nerves are called peripheral neuropathy. You may feel numbness, tingling, or pain in your hands and feet. It may be hard for you to button your clothes, open jars, or walk as usual. The effect on the nerves may get worse with more doses of the drug. These effects get better in some people after the drug is stopped but it does not get better in all people. . Loose bowel movements (diarrhea) that may last for several days . Decreased hearing or ringing in the ears . Changes in the way food and drinks taste . Changes in liver function. Your liver function will be checked as needed.  Allergic Reactions Serious allergic reactions including anaphylaxis are rare. While  you are getting this drug in your vein (IV), tell your nurse right away if you have any of these symptoms of an allergic reaction: . Trouble catching your breath . Feeling like your tongue or throat are swelling . Feeling your heart beat quickly or in a not normal way (palpitations) . Feeling dizzy or lightheaded . Flushing, itching, rash, and/or hives  Treating Side Effects . Drink 6-8 cups of fluids each day unless your doctor has told you to limit your fluid intake due to some  other health problem. A cup is 8 ounces of fluid. If you throw up or have loose bowel movements, you should drink more fluids so that you do not become dehydrated (lack water in the body from losing too much fluid). . Mouth care is very important. Your mouth care should consist of routine, gentle cleaning of your teeth or dentures and rinsing your mouth with a mixture of 1/2 teaspoon of salt in 8 ounces of water or  teaspoon of baking soda in 8 ounces of water. This should be done at least after each meal and at bedtime. . If you have mouth sores, avoid mouthwash that has alcohol. Avoid alcohol and smoking because they can bother your mouth and throat. . If you have numbness and tingling in your hands and feet, be careful when cooking, walking, and handling sharp objects and hot liquids. . Talk with your nurse about getting a wig before you lose your hair. Also, call the [facility] at 800-ACS-2345 to find out information about the "Look Good, Feel Better" program close to where you live. It is a free program where women getting chemotherapy can learn about wigs, turbans and scarves as well as makeup techniques and skin and nail care.  Food and Drug Interactions There are no known interactions of carboplatin with food. This drug may interact with other medicines. Tell your doctor and pharmacist about all the medicines and dietary supplements (vitamins, minerals, herbs and others) that you are taking at this time. The safety and use of dietary supplements and alternative diets are often not known. Using these might affect your cancer or interfere with your treatment. Until more is known, you should not use dietary supplements or alternative diets without your cancer doctor's help.  When to Call the Doctor Call your doctor or nurse right away if you have any of these symptoms: . Fever of 100.5 F (38 C) or above; chills . Bleeding or bruising that is not normal . Wheezing or  trouble breathing . Nausea that stops you from eating or drinking . Throwing up more than once a day . Rash or itching . Loose bowel movements (diarrhea) more than four times a day or diarrhea with weakness or feeling lightheaded . Call your doctor or nurse as soon as possible if any of these symptoms happen: . Numbness, tingling, decreased feeling or weakness in fingers, toes, arms, or legs . Change in hearing, ringing in the ears . Blurred vision or other changes in eyesight . Decreased urine . Yellowing of skin or eyes  Sexual Problems and Reproductive Concerns Sexual problems and reproduction concerns may happen. In both men and women, this drug may affect your ability to have children. This cannot be determined before your treatment. Talk with your doctor or nurse if you plan to have children. Ask for information on sperm or egg banking. In men, this drug may interfere with your ability to make sperm, but it should not change your ability  to have sexual relations. In women, menstrual bleeding may become irregular or stop while you are getting this drug. Do not assume that you cannot become pregnant if you do not have a menstrual period. Women may go through signs of menopause (change of life) like vaginal dryness or itching. Vaginal lubricants can be used to lessen vaginal dryness, itching, and pain during sexual relations. Genetic counseling is available for you to talk about the effects of this drug therapy on future pregnancies. Also, a genetic counselor can look at the possible risk of problems in the unborn baby due to this medicine if an exposure happens during pregnancy. . Pregnancy warning: This drug may have harmful effects on the unborn child, so effective methods of birth control should be used during your cancer treatment. . Breast feeding warning: It is not known if this drug passes into breast milk. For this reason, women should talk to their doctor about the risks and  benefits of breast feeding during treatment with this drug because this drug may enter the breast milk and badly harm a breast feeding baby.  Gemcitabine (Generic Name) Other Names: Gemzar  About This Drug Gemcitabine is used to treat cancer. This drug is given in the vein (IV).  This drug will take 30 minutes to infuse.  Possible Side Effects (Most Common) . Bone marrow depression. This is a decrease in the number of white blood cells, red blood cells, and platelets. This may raise your risk of infection, make you tired and weak (fatigue), and raise your risk of bleeding. . Nausea and throwing up (vomiting). These symptoms may happen within a few hours after your treatment and may last up to several days. Medicines are available to stop or lessen these side effects. . Loose bowel movements (diarrhea) that may last for a few days . Fluid retention. You may have swelling of your arms, legs, face, chest or abdomen . Raised, red rash on arms, legs, back, or chest . Flu-like symptoms: fever, headache, muscle and joint aches, and fatigue (low energy, feeling weak) . Changes in your liver function. Your doctor will check your liver function as needed.  Possible Side Effects (Less Common) . Feeling short of breath . Decreased appetite (decreased hunger) . Effects on the nerves are called peripheral neuropathy. You may feel numbness, tingling, or pain in your hands and feet. It may be hard for you to button your clothes, open jars, or walk as usual. The effect on the nerves may get worse with more doses of the drug. These effects get better in some people after the drug is stopped but it does not get better in all people.  Treating Side Effects . Ask your doctor or nurse about medication that is available to help stop or lessen nausea, throwing up, loose bowel movements, headache, muscle and joint aches. . Drink 6-8 cups of fluids each day unless your doctor has told you to limit your fluid  intake due to some other health problem. A cup is 8 ounces of fluid. If you throw up or have loose bowel movements you should drink more fluids so that you do not become dehydrated (lack water in the body due to losing too much fluid). . If you get a rash, do not put anything on it unless your doctor or nurse says you may. Keep the area around the rash clean and dry. Ask your doctor for medicine if your rash bothers you. . If you have numbness and tingling in your  hands and feet, be careful when cooking, walking, and handling sharp objects and hot liquids.  Food and Drug Interactions There are no known interactions of gemcitabine with food. This drug may interact with other medicines. Tell your doctor and pharmacist about all the medicines and dietary supplements (vitamins, minerals, herbs and others) that you are taking at this time. The safety and use of dietary supplements and alternative diets are often not known. Using these might affect your cancer or interfere with your treatment. Until more is known, you should not use dietary supplements or alternative diets without your cancer doctor's help.  When to Call the Doctor Call your doctor or nurse right away if you have any of these symptoms: . Fever of 100.5 F (38 C) or above . Chills . Bleeding or bruising that is not normal . Wheezing or trouble breathing . Rash or itching . Nausea that stops you from eating or drinking . Loose bowel movements (diarrhea) more than 4 times a day or diarrhea with weakness or lightheadedness . Swelling of your arms, legs, face, chest or abdomen (fluid retention) Call your doctor or nurse as soon as possible if any of these symptoms happen: . Numbness, tingling, decreased feeling or weakness in fingers, toes, arms, or legs . Trouble walking or changes in the way you walk, feeling clumsy when buttoning clothes, opening jars, or other routine hand motions . Weight gain of 5 pounds in one week (fluid  retention) . Lasting loss of appetite or rapid weight loss of five pounds in a week . Fatigue that interferes with your daily activities  Sexual Problems and Reproduction Concerns . Infertility warning: Sexual problems and reproduction concerns may happen. In both men and women, this drug may affect your ability to have children. This cannot be determined before your treatment. Talk with your doctor or nurse if you plan to have children. Ask for information on sperm or egg banking. . In men, this drug may interfere with your ability to make sperm, but it should not change your ability to have sexual relations. . In women, menstrual bleeding may become irregular or stop while you are getting this drug. Do not assume that you cannot become pregnant if you do not have a menstrual period. . Women may go through signs of menopause (change of life) like vaginal dryness or itching. Vaginal lubricants can be used to lessen vaginal dryness, itching, and pain during sexual relations. . Genetic counseling is available for you to talk about the effects of this drug therapy on future pregnancies. Also, a genetic counselor can look at the possible risk of problems in the unborn baby due to this medicine if an exposure happens during pregnancy. . Pregnancy warning: This drug may have harmful effects on the unborn child, so effective methods of birth control should be used during your cancer treatment. . Breast feeding warning: It is not known if this drug passes into breast milk. For this reason, women should talk to their doctor about the risks and benefits of breast feeding during treatment with this drug because this drug may enter the breast milk and badly harm a breast feeding baby.    SELF CARE ACTIVITIES WHILE ON CHEMOTHERAPY: Hydration Increase your fluid intake 48 hours prior to treatment and drink at least 8 to 12 cups (64 ounces) of water/decaff beverages per day after treatment. You can  still have your cup of coffee or soda but these beverages do not count as part of your 8 to  12 cups that you need to drink daily. No alcohol intake.  Medications Continue taking your normal prescription medication as prescribed.  If you start any new herbal or new supplements please let us know first to make sure it is safe.  Mouth Care Have teeth cleaned professionally before starting treatment. Keep dentures and partial plates clean. Use soft toothbrush and do not use mouthwashes that contain alcohol. Biotene is a good mouthwash that is available at most pharmacies or may be ordered by calling [phone]. Use warm salt water gargles (1 teaspoon salt per 1 quart warm water) before and after meals and at bedtime. Or you may rinse with 2 tablespoons of three-percent hydrogen peroxide mixed in eight ounces of water. If you are still having problems with your mouth or sores in your mouth please call the clinic. If you need dental work, please let the doctor know before you go for your appointment so that we can coordinate the best possible time for you in regards to your chemo regimen. You need to also let your dentist know that you are actively taking chemo. We may need to do labs prior to your dental appointment.   Skin Care Always use sunscreen that has not expired and with SPF (Sun Protection Factor) of 50 or higher. Wear hats to protect your head from the sun. Remember to use sunscreen on your hands, ears, face, & feet.  Use good moisturizing lotions such as udder cream, eucerin, or even Vaseline. Some chemotherapies can cause dry skin, color changes in your skin and nails.    . Avoid long, hot showers or baths. . Use gentle, fragrance-free soaps and laundry detergent. . Use moisturizers, preferably creams or ointments rather than lotions because the thicker consistency is better at preventing skin dehydration. Apply the cream or ointment within 15 minutes of showering. Reapply moisturizer at  night, and moisturize your hands every time after you wash them.  Hair Loss (if your doctor says your hair will fall out)  . If your doctor says that your hair is likely to fall out, decide before you begin chemo whether you want to wear a wig. You may want to shop before treatment to match your hair color. . Hats, turbans, and scarves can also camouflage hair loss, although some people prefer to leave their heads uncovered. If you go bare-headed outdoors, be sure to use sunscreen on your scalp. . Cut your hair short. It eases the inconvenience of shedding lots of hair, but it also can reduce the emotional impact of watching your hair fall out. . Don't perm or color your hair during chemotherapy. Those chemical treatments are already damaging to hair and can enhance hair loss. Once your chemo treatments are done and your hair has grown back, it's OK to resume dyeing or perming hair. With chemotherapy, hair loss is almost always temporary. But when it grows back, it may be a different color or texture. In older adults who still had hair color before chemotherapy, the new growth may be completely gray.  Often, new hair is very fine and soft.  Infection Prevention Please wash your hands for at least 30 seconds using warm soapy water. Handwashing is the #1 way to prevent the spread of germs. Stay away from sick people or people who are getting over a cold. If you develop respiratory systems such as green/yellow mucus production or productive cough or persistent cough let us know and we will see if you need an antibiotic. It is  a good idea to keep a pair of gloves on when going into grocery stores/[employer] to decrease your risk of coming into contact with germs on the carts, etc. Carry alcohol hand gel with you at all times and use it frequently if out in public. If your temperature reaches 100.5 or higher please call the clinic and let us know.  If it is after hours or on the weekend please go to the ER if  your temperature is over 100.5.  Please have your own personal thermometer at home to use.    Sex and bodily fluids If you are going to have sex, a condom must be used to protect the person that isn't taking chemotherapy. Chemo can decrease your libido (sex drive). For a few days after chemotherapy, chemotherapy can be excreted through your bodily fluids.  When using the toilet please close the lid and flush the toilet twice.  Do this for a few day after you have had chemotherapy.     Effects of chemotherapy on your sex life Some changes are simple and won't last long. They won't affect your sex life permanently. Sometimes you may feel: . too tired . not strong enough to be very active . sick or sore  . not in the mood . anxious or low Your anxiety might not seem related to sex. For example, you may be worried about the cancer and how your treatment is going. Or you may be worried about money, or about how you family are coping with your illness. These things can cause stress, which can affect your interest in sex. It's important to talk to your partner about how you feel. Remember - the changes to your sex life don't usually last long. There's usually no medical reason to stop having sex during chemo. The drugs won't have any long term physical effects on your performance or enjoyment of sex. Cancer can't be passed on to your partner during sex  Contraception It's important to use reliable contraception during treatment. Avoid getting pregnant while you or your partner are having chemotherapy. This is because the drugs may harm the baby. Sometimes chemotherapy drugs can leave a man or woman infertile.  This means you would not be able to have children in the future. You might want to talk to someone about permanent infertility. It can be very difficult to learn that you may no longer be able to have children. Some people find counselling helpful. There might be ways to preserve your fertility,  although this is easier for men than for women. You may want to speak to a fertility expert. You can talk about sperm banking or harvesting your eggs. You can also ask about other fertility options, such as donor eggs. If you have or have had breast cancer, your doctor might advise you not to take the contraceptive pill. This is because the hormones in it might affect the cancer.  It is not known for sure whether or not chemotherapy drugs can be passed on through semen or secretions from the vagina. Because of this some doctors advise people to use a barrier method if you have sex during treatment. This applies to vaginal, anal or oral sex. Generally, doctors advise a barrier method only for the time you are actually having the treatment and for about a week after your treatment. Advice like this can be worrying, but this does not mean that you have to avoid being intimate with your partner. You can still have close contact  with your partner and continue to enjoy sex.  Animals If you have cats or birds we just ask that you not change the litter or change the cage.  Please have someone else do this for you while you are on chemotherapy.   Food Safety During and After Cancer Treatment Food safety is important for people both during and after cancer treatment. Cancer and cancer treatments, such as chemotherapy, radiation therapy, and stem cell/bone marrow transplantation, often weaken the immune system. This makes it harder for your body to protect itself from foodborne illness, also called food poisoning. Foodborne illness is caused by eating food that contains harmful bacteria, parasites, or viruses.  Foods to avoid Some foods have a higher risk of becoming tainted with bacteria. These include: [employer] Kitchen Unwashed fresh fruit and vegetables, especially leafy vegetables that can hide dirt and other contaminants . Raw sprouts, such as alfalfa sprouts . Raw or undercooked beef, especially ground beef, or other  raw or undercooked meat and poultry . Fatty, fried, or spicy foods immediately before or after treatment.  These can sit heavy on your stomach and make you feel nauseous. . Raw or undercooked shellfish, such as oysters. . Sushi and sashimi, which often contain raw fish.  . Unpasteurized beverages, such as unpasteurized fruit juices, raw milk, raw yogurt, or cider . Undercooked eggs, such as soft boiled, over easy, and poached; raw, unpasteurized eggs; or foods made with raw egg, such as homemade raw cookie dough and homemade mayonnaise Simple steps for food safety Shop smart. . Do not buy food stored or displayed in an unclean area. . Do not buy bruised or damaged fruits or vegetables. . Do not buy cans that have cracks, dents, or bulges. . Pick up foods that can spoil at the end of your shopping trip and store them in a cooler on the way home. Prepare and clean up foods carefully. . Rinse all fresh fruits and vegetables under running water, and dry them with a clean towel or paper towel. . Clean the top of cans before opening them. . After preparing food, wash your hands for 20 seconds with hot water and soap. Pay special attention to areas between fingers and under nails. . Clean your utensils and dishes with hot water and soap. [employer] Disinfect your kitchen and cutting boards using 1 teaspoon of liquid, unscented bleach mixed into 1 quart of water.   Dispose of old food. . Eat canned and packaged food before its expiration date (the "use by" or "best before" date). . Consume refrigerated leftovers within 3 to 4 days. After that time, throw out the food. Even if the food does not smell or look spoiled, it still may be unsafe. Some bacteria, such as Listeria, can grow even on foods stored in the refrigerator if they are kept for too long. Take precautions when eating out. . At restaurants, avoid buffets and salad bars where food sits out for a long time and comes in contact with many people. Food  can become contaminated when someone with a virus, often a norovirus, or another "bug" handles it. . Put any leftover food in a "to-go" container yourself, rather than having the server do it. And, refrigerate leftovers as soon as you get home. . Choose restaurants that are clean and that are willing to prepare your food as you order it cooked.    MEDICATIONS: Dexamethasone 4 mg tablet:  Take 2 tablets (8 mg total) by mouth daily. Start the day after carboplatin  chemotherapy for 2 days.  Take with food.    Zofran/Ondansetron 8mg  tablet. Take 1 tablet every 8 hours as needed for nausea/vomiting. (#1 nausea med to take, this can constipate)  Compazine/Prochlorperazine 10mg  tablet. Take 1 tablet every 6 hours as needed for nausea/vomiting. (#2 nausea med to take, this can make you sleepy)   EMLA cream. Apply a quarter size amount to port site 1 hour prior to chemo. Do not rub in. Cover with plastic wrap.   Over-the-Counter Meds:  Miralax 1 capful in 8 oz of fluid daily. May increase to two times a day if needed. This is a stool softener. If this doesn't work proceed you can add:  Senokot S-start with 1 tablet two times a day and increase to 4 tablets two times a day if needed. (total of 8 tablets in a 24 hour period). This is a stimulant laxative.   Call us if this does not help your bowels move.   Imodium 2mg  capsule. Take 2 capsules after the 1st loose stool and then 1 capsule every 2 hours until you go a total of 12 hours without having a loose stool. Call the [facility] if loose stools continue. If diarrhea occurs @ bedtime, take 2 capsules @ bedtime. Then take 2 capsules every 4 hours until morning. Call [facility].    Constipation Sheet *Miralax in 8 oz of fluid daily.  May increase to two times a day if needed.  This is a stool softener.  If this not enough to keep your bowel regular:  You can add:  *Senokot S, start with one tablet twice a day and can increase to 4  tablets twice a day if needed.  This is a stimulant laxative.   Sometimes when you take pain medication you need BOTH a medicine to keep your stool soft and a medicine to help your bowel push it out!  Please call if the above does not work for you.   Do not go more than 2 days without a bowel movement.  It is very important that you do not become constipated.  It will make you feel sick to your stomach (nausea) and can cause abdominal pain and vomiting.    Diarrhea Sheet  If you are having loose stools/diarrhea, please purchase Imodium and begin taking as outlined:  At the first sign of poorly formed or loose stools you should begin taking Imodium(loperamide) 2 mg capsules.  Take two caplets (4mg ) followed by one caplet (2mg ) every 2 hours until you have had no diarrhea for 12 hours.  During the night take two caplets (4mg ) at bedtime and continue every 4 hours during the night until the morning.  Stop taking Imodium only after there is no sign of diarrhea for 12 hours.    Always call the [facility] if you are having loose stools/diarrhea that you can't get under control.  Loose stools/disrrhea leads to dehydration (loss of water) in your body.  We have other options of trying to get the loose stools/diarrhea to stopped but you must let us know!     Nausea Sheet  Zofran/Ondansetron 8mg  tablet. Take 1 tablet every 8 hours as needed for nausea/vomiting. (#1 nausea med to take, this can constipate)  Compazine/Prochlorperazine 10mg  tablet. Take 1 tablet every 6 hours as needed for nausea/vomiting. (#2 nausea med to take, this can make you sleepy)  You can take these medications together or separately.  We would first like for you to try the Ondansetron by  itself and then take the Prochloperizine if needed. But you are allowed to take both medications at the same time if your nausea is that severe.  If you are having persistent nausea (nausea that does not stop) please take these medications on  a staggered schedule so that the nausea medication stays in your body.  Please call the [facility] and let us know the amount of nausea that you are experiencing.  If you begin to vomit, you need to call the [facility] and if it is the weekend and you have vomited more than one time and cant get it to stop-go to the Emergency Room.  Persistent nausea/vomiting can lead to dehydration (loss of fluid in your body) and will make you feel terrible.   Ice chips, sips of clear liquids, foods that are @ room temperature, crackers, and toast tend to be better tolerated.    SYMPTOMS TO REPORT AS SOON AS POSSIBLE AFTER TREATMENT:  FEVER GREATER THAN 100.5 F  CHILLS WITH OR WITHOUT FEVER  NAUSEA AND VOMITING THAT IS NOT CONTROLLED WITH YOUR NAUSEA MEDICATION  UNUSUAL SHORTNESS OF BREATH  UNUSUAL BRUISING OR BLEEDING  TENDERNESS IN MOUTH AND THROAT WITH OR WITHOUT PRESENCE OF ULCERS  URINARY PROBLEMS  BOWEL PROBLEMS  UNUSUAL RASH    Wear comfortable clothing and clothing appropriate for easy access to any Portacath or PICC line. Let us know if there is anything that we can do to make your therapy better!    What to do if you need assistance after hours or on the weekends: CALL [phone].  HOLD on the line, do not hang up.  You will hear multiple messages but at the end you will be connected with a nurse triage line.  They will contact the doctor if necessary.  Most of the time they will be able to assist you.  Do not call the hospital operator.    I have been informed and understand all of the instructions given to me and have received a copy. I have been instructed to call the clinic [phone] or my family physician as soon as possible for continued medical care, if indicated. I do not have any more questions at this time but understand that I may call the [facility] [place] [facility] or the Patient Navigator at [phone] during office hours should I have questions or need assistance in  obtaining follow-up care.

## 2017-06-17 ENCOUNTER — Other Ambulatory Visit: Payer: Self-pay | Admitting: Radiology

## 2017-06-18 ENCOUNTER — Other Ambulatory Visit (HOSPITAL_COMMUNITY): Payer: Self-pay | Admitting: Oncology

## 2017-06-18 ENCOUNTER — Ambulatory Visit (HOSPITAL_COMMUNITY)
Admission: RE | Admit: 2017-06-18 | Discharge: 2017-06-18 | Disposition: A | Discharge status: Home / Self Care | Payer: Medicare Other | Source: Ambulatory Visit | Attending: Oncology | Admitting: Oncology

## 2017-06-18 ENCOUNTER — Encounter (HOSPITAL_COMMUNITY): Payer: Self-pay

## 2017-06-18 DIAGNOSIS — I110 Hypertensive heart disease with heart failure: Secondary | ICD-10-CM | POA: Diagnosis not present

## 2017-06-18 DIAGNOSIS — C679 Malignant neoplasm of bladder, unspecified: Secondary | ICD-10-CM | POA: Diagnosis not present

## 2017-06-18 DIAGNOSIS — F17210 Nicotine dependence, cigarettes, uncomplicated: Secondary | ICD-10-CM | POA: Insufficient documentation

## 2017-06-18 DIAGNOSIS — I509 Heart failure, unspecified: Secondary | ICD-10-CM | POA: Diagnosis not present

## 2017-06-18 DIAGNOSIS — C670 Malignant neoplasm of trigone of bladder: Secondary | ICD-10-CM

## 2017-06-18 DIAGNOSIS — F329 Major depressive disorder, single episode, unspecified: Secondary | ICD-10-CM | POA: Insufficient documentation

## 2017-06-18 DIAGNOSIS — J449 Chronic obstructive pulmonary disease, unspecified: Secondary | ICD-10-CM | POA: Diagnosis not present

## 2017-06-18 DIAGNOSIS — Z7951 Long term (current) use of inhaled steroids: Secondary | ICD-10-CM | POA: Insufficient documentation

## 2017-06-18 HISTORY — PX: IR US GUIDE VASC ACCESS RIGHT: IMG2390

## 2017-06-18 HISTORY — PX: IR FLUORO GUIDE PORT INSERTION RIGHT: IMG5741

## 2017-06-18 LAB — CBC
HCT: 29.9 % — ABNORMAL LOW (ref 39.0–52.0)
Hemoglobin: 9.8 g/dL — ABNORMAL LOW (ref 13.0–17.0)
MCH: 29.6 pg (ref 26.0–34.0)
MCHC: 32.8 g/dL (ref 30.0–36.0)
MCV: 90.3 fL (ref 78.0–100.0)
Platelets: 344 10*3/uL (ref 150–400)
RBC: 3.31 MIL/uL — AB (ref 4.22–5.81)
RDW: 16 % — ABNORMAL HIGH (ref 11.5–15.5)
WBC: 5.9 10*3/uL (ref 4.0–10.5)

## 2017-06-18 LAB — BASIC METABOLIC PANEL
Anion gap: 9 (ref 5–15)
BUN: 28 mg/dL — ABNORMAL HIGH (ref 6–20)
CHLORIDE: 107 mmol/L (ref 101–111)
CO2: 22 mmol/L (ref 22–32)
CREATININE: 2.24 mg/dL — AB (ref 0.61–1.24)
Calcium: 8.6 mg/dL — ABNORMAL LOW (ref 8.9–10.3)
GFR calc non Af Amer: 28 mL/min — ABNORMAL LOW (ref >60)
GFR, EST AFRICAN AMERICAN: 33 mL/min — AB (ref >60)
Glucose, Bld: 91 mg/dL (ref 65–99)
Potassium: 5.5 mmol/L — ABNORMAL HIGH (ref 3.5–5.1)
SODIUM: 138 mmol/L (ref 135–145)

## 2017-06-18 LAB — APTT: aPTT: 33 seconds (ref 24–36)

## 2017-06-18 LAB — PROTIME-INR
INR: 1.03
PROTHROMBIN TIME: 13.5 s (ref 11.4–15.2)

## 2017-06-18 MED ORDER — MIDAZOLAM HCL 2 MG/2ML IJ SOLN
INTRAMUSCULAR | Status: AC
  Filled 2017-06-18: qty 4

## 2017-06-18 MED ORDER — HEPARIN SOD (PORK) LOCK FLUSH 100 UNIT/ML IV SOLN
INTRAVENOUS | Status: AC | PRN
  Administered 2017-06-18: 500 [IU] via INTRAVENOUS

## 2017-06-18 MED ORDER — FENTANYL CITRATE (PF) 100 MCG/2ML IJ SOLN
INTRAMUSCULAR | Status: AC | PRN
  Administered 2017-06-18 (×2): 50 ug via INTRAVENOUS

## 2017-06-18 MED ORDER — CEFAZOLIN SODIUM-DEXTROSE 2-4 GM/100ML-% IV SOLN
2.0000 g | INTRAVENOUS | Status: AC
  Administered 2017-06-18: 2 g via INTRAVENOUS

## 2017-06-18 MED ORDER — HEPARIN SOD (PORK) LOCK FLUSH 100 UNIT/ML IV SOLN
INTRAVENOUS | Status: AC
  Filled 2017-06-18: qty 5

## 2017-06-18 MED ORDER — SODIUM CHLORIDE 0.9 % IV SOLN
INTRAVENOUS | Status: DC
  Administered 2017-06-18: 12:00:00 via INTRAVENOUS

## 2017-06-18 MED ORDER — MIDAZOLAM HCL 2 MG/2ML IJ SOLN
INTRAMUSCULAR | Status: AC | PRN
  Administered 2017-06-18 (×2): 1 mg via INTRAVENOUS

## 2017-06-18 MED ORDER — FENTANYL CITRATE (PF) 100 MCG/2ML IJ SOLN
INTRAMUSCULAR | Status: AC
  Filled 2017-06-18: qty 4

## 2017-06-18 MED ORDER — LIDOCAINE HCL 1 % IJ SOLN
INTRAMUSCULAR | Status: AC | PRN
  Administered 2017-06-18: 10 mL via INTRADERMAL

## 2017-06-18 MED ORDER — LIDOCAINE HCL 1 % IJ SOLN
INTRAMUSCULAR | Status: AC
  Filled 2017-06-18: qty 20

## 2017-06-18 MED ORDER — CEFAZOLIN SODIUM-DEXTROSE 2-4 GM/100ML-% IV SOLN
INTRAVENOUS | Status: AC
  Administered 2017-06-18: 2 g via INTRAVENOUS
  Filled 2017-06-18: qty 100

## 2017-06-18 NOTE — Procedures (Signed)
Interventional Radiology Procedure Note  Procedure: Placement of a right IJ approach single lumen PowerPort.  Tip is positioned at the superior cavoatrial junction and catheter is ready for immediate use.  Complications: No immediate Recommendations:  - Ok to shower tomorrow - Do not submerge for 7 days - Routine line care   Heavenly Christine T. Rashaun Wichert, M.D Pager:  [phone]   

## 2017-06-18 NOTE — H&P (Signed)
Chief Complaint: Bladder Cancer  Referring Physician(s): Zhou,Louise  Supervising Physician: Aletta Edouard  Patient Status: [facility] - Out-pt  History of Present Illness: Carl Murphy is a 71 y.o. male with recurrent bladder cancer.  He was hospitalized in June for hematuria.  Cystoscopy showed recurrent bladder tumor which involved the right ureter. DJ stent was attempted but unsuccessful at that time.  Final pathology showed invasion into the muscle.  He has known hydronephrosis and has refused placement of nephrostomy tube.  PET scan on 06/10/17 showed metasynchronous urothelial carcinoma within the distal right ureter, no evidence of nodal metastasis, bilateral hypermetabolic pulmonary nodules favoring pulmonary metastasis.  He is here today for placement of a Port A Cath.  He is NPO. No blood thinners. No fever/chills.  His daughter is with him today and tells me he needs a nephrostomy tube as well. She asked appropriate questing regarding that procedure.   [name] knows he needs this done to save that kidney but is not yet willing to have the procedure. His daughter is encouraging him.  Past Medical History:  Diagnosis Date  . Arthritis   . Cancer [facility])    Bladder Cancer  . CHF (congestive heart failure) ([place])   . COPD (chronic obstructive pulmonary disease) ([place])   . Depression   . Hard of hearing    bilat   . Hypertension   . Malignant neoplasm of trigone of bladder ([place]) 03/25/2016  . PONV (postoperative nausea and vomiting)   . Shortness of breath dyspnea    heat; walking up set of stairs; pt states can walk up 12-14 stairs slowly without having to stop to catch his breath    Past Surgical History:  Procedure Laterality Date  . CYSTOSCOPY N/A 06/16/2016   Procedure: CYSTOSCOPY;  Surgeon: Franchot Gallo, MD;  Location: [facility];  Service: Urology;  Laterality: N/A;  . TRANSURETHRAL RESECTION OF BLADDER TUMOR N/A 03/25/2016   Procedure: TRANSURETHRAL  RESECTION OF BLADDER TUMOR (TURBT);  Surgeon: Franchot Gallo, MD;  Location: [facility];  Service: Urology;  Laterality: N/A;  . TRANSURETHRAL RESECTION OF BLADDER TUMOR N/A 06/16/2016   Procedure: TRANSURETHRAL RESECTION OF BLADDER TUMOR (TURBT);  Surgeon: Franchot Gallo, MD;  Location: [facility];  Service: Urology;  Laterality: N/A;  . TRANSURETHRAL RESECTION OF BLADDER TUMOR N/A 12/16/2016   Procedure: TRANSURETHRAL RESECTION OF BLADDER TUMOR (TURBT);  Surgeon: Franchot Gallo, MD;  Location: [facility];  Service: Urology;  Laterality: N/A;  . TRANSURETHRAL RESECTION OF BLADDER TUMOR N/A 05/11/2017   Procedure: TRANSURETHRAL RESECTION OF BLADDER TUMOR (TURBT) CLOT EVACUATION;  Surgeon: Franchot Gallo, MD;  Location: [facility];  Service: Urology;  Laterality: N/A;  1 HR [phone] BLUE [id number]    Allergies: Patient has no known allergies.  Medications: Prior to Admission medications   Medication Sig Start Date End Date Taking? Authorizing Provider  amLODipine (NORVASC) 5 MG tablet 5 mg. 05/12/17   [provider]  buPROPion (WELLBUTRIN XL) 150 MG 24 hr tablet 150 mg. 03/21/17   [provider]  CARBOPLATIN IV Inject into the vein. Day 1 every 21 days    [provider]  carvedilol (COREG) 25 MG tablet Take 25 mg by mouth 2 (two) times daily.  01/31/16   [provider]  citalopram (CELEXA) 20 MG tablet Take 20 mg by mouth at bedtime.  01/31/16   [provider]  dexamethasone (DECADRON) 4 MG tablet Take 2 tablets (8 mg total) by mouth daily. Start the day after carboplatin  chemotherapy for 2 days. 06/11/17   Twana First, MD  fluticasone [facility]) 50 MCG/ACT nasal spray Place 2 sprays into both nostrils daily as needed for allergies or rhinitis.    [provider]  furosemide (LASIX) 40 MG tablet  05/18/17   [provider]  Gemcitabine HCl (GEMZAR IV) Inject into the vein. Day 1, day 8 every 21 days    [provider]  hydrochlorothiazide (HYDRODIURIL) 25 MG tablet 25 mg. 05/12/17   [provider]  lidocaine-prilocaine (EMLA) cream Apply to affected area once Patient not taking: Reported on 06/11/2017 06/11/17   Twana First, MD  loratadine (CLARITIN) 10 MG tablet Take 10 mg by mouth daily as needed for allergies.    [provider]  ondansetron (ZOFRAN) 8 MG tablet Take 1 tablet (8 mg total) by mouth 2 (two) times daily as needed for refractory nausea / vomiting. Start on day 3 after carboplatin chemo. 06/11/17   Twana First, MD  oxybutynin (DITROPAN) 5 MG tablet Take 1 tablet (5 mg total) by mouth every 6 (six) hours as needed for bladder spasms. 05/12/17   McKenzie, Candee Furbish, MD  oxyCODONE (OXY IR/ROXICODONE) 5 MG immediate release tablet 5 mg. 05/12/17   [provider]  prochlorperazine (COMPAZINE) 10 MG tablet Take 1 tablet (10 mg total) by mouth every 6 (six) hours as needed (Nausea or vomiting). 06/11/17   Twana First, MD     Family History  Problem Relation Age of Onset  . Hypertension Mother   . Asthma Father   . Emphysema Father     Social History   Social History  . Marital status: Married    Spouse name: N/A  . Number of children: N/A  . Years of education: N/A   Social History Main Topics  . Smoking status: Current Every Day Smoker    Packs/day: 2.00    Years: 50.00    Types: Cigarettes  . Smokeless tobacco: Never Used  . Alcohol use No     Comment: occasionally  . Drug use: No  . Sexual activity: No   Other Topics Concern  . None   Social History Narrative  . None    Review of Systems: A 12 point ROS discussed  Review of Systems  Constitutional: Negative.   HENT: Negative.   Respiratory: Negative.   Cardiovascular: Negative.   Gastrointestinal: Negative.   Genitourinary: Positive for hematuria.  Musculoskeletal: Negative.   Skin: Negative.   Neurological: Negative.   Hematological: Negative.   Psychiatric/Behavioral:  Negative.     Vital Signs: BP (!) 158/73 (BP Location: Left Arm)   Pulse 71   Temp 97.8 F (36.6 C) (Oral)   Resp 18   SpO2 98%   Physical Exam  Constitutional: He is oriented to person, place, and time. He appears well-developed.  HENT:  Head: Normocephalic and atraumatic.  Eyes: EOM are normal.  Neck: Normal range of motion.  Cardiovascular: Normal rate, regular rhythm and normal heart sounds.   Pulmonary/Chest: Effort normal.  Some mild rhonchi bilaterally. Good air exchange.  Abdominal: Soft. He exhibits no distension. There is no tenderness.  Musculoskeletal: Normal range of motion.  Neurological: He is alert and oriented to person, place, and time.  Skin: Skin is warm and dry.  Psychiatric: He has a normal mood and affect. His behavior is normal. Judgment and thought content normal.    Mallampati Score:  MD Evaluation Airway: WNL Heart: WNL Abdomen: WNL Chest/ Lungs: WNL ASA  Classification: 3  Mallampati/Airway Score: One  Imaging: Nm Pet Image Initial (pi) Skull Base To Thigh  Result Date: 06/10/2017 CLINICAL DATA:  Initial treatment strategy for bladder cancer. EXAM: NUCLEAR MEDICINE PET SKULL BASE TO THIGH TECHNIQUE: 8.8 mCi F-18 FDG was injected intravenously. Full-ring PET imaging was performed from the skull base to thigh after the radiotracer. CT data was obtained and used for attenuation correction and anatomic localization. FASTING BLOOD GLUCOSE:  Value: 100 mg/dl COMPARISON:  Abdominopelvic CTs, most recent 04/07/2017. Chest CT 04/14/2013. FINDINGS: NECK No areas of abnormal hypermetabolism. Bilateral carotid atherosclerosis. No cervical adenopathy. CHEST No thoracic nodal hypermetabolism. Bilateral hypermetabolic pulmonary nodules. Cavitary left upper lobe pulmonary nodule measures 1.3 cm and a S.U.V. max of 9.7 on image 28/ series 7. Central right middle lobe 1.2 cm nodule measures a S.U.V. max of 3.9 on image 40/series 7. Lingular nodule measures 10 mm and a  S.U.V. max of 4.1 on image 54/series 7. Coronary artery atherosclerosis. No thoracic adenopathy. Moderate centrilobular emphysema. ABDOMEN/PELVIS No abdominopelvic nodal hypermetabolism. Hypermetabolism corresponds to the enhancing long segment right ureteric lesion. This measures a S.U.V. max of 6.5, including on image 172/series 4. Urinary contamination about the tip of the penis. Persistent moderate right-sided hydroureteronephrosis with overlying cortical thinning. Advanced abdominal aortic and branch vessel atherosclerosis. No abdominopelvic adenopathy. Fat containing periumbilical hernia. SKELETON No abnormal marrow activity. No focal osseous lesion. IMPRESSION: 1. Metachronous urothelial carcinoma within the distal right ureter, as on prior CT. 2. No evidence of nodal metastasis. 3. Bilateral hypermetabolic pulmonary nodules, favoring pulmonary metastasis. Especially given emphysema, 1 or more primary bronchogenic carcinomas cannot be excluded but are felt less likely. 4. Coronary artery atherosclerosis. Aortic Atherosclerosis (ICD10-I70.0). 5. Bladder not well evaluated secondary to hypermetabolic urine within. Electronically Signed   By: Abigail Miyamoto M.D.   On: 06/10/2017 15:17    Labs:  CBC:  Recent Labs  05/11/17 0543 05/12/17 0726 06/11/17 1029 06/18/17 1152  WBC 4.4 4.6 5.8 5.9  HGB 8.6* 8.9* 9.7* 9.8*  HCT 25.9* 27.1* 30.6* 29.9*  PLT 271 302 369 344    COAGS:  Recent Labs  05/09/17 0714  INR 0.96    BMP:  Recent Labs  05/10/17 0535 05/11/17 0543 05/12/17 0726 06/11/17 1029  NA 136 134* 132* 137  K 4.4 4.5 5.7* 4.7  CL 105 103 99* 105  CO2 26 23 27 26   GLUCOSE 116* 92 136* 100*  BUN 27* 25* 28* 27*  CALCIUM 7.8* 7.5* 8.0* 8.7*  CREATININE 2.00* 1.93* 2.14* 1.92*  GFRNONAA 32* 34* 30* 34*  GFRAA 37* 39* 35* 39*    LIVER FUNCTION TESTS:  Recent Labs  09/01/16 1209 09/08/16 1224 10/06/16 1303 06/11/17 1029  BILITOT 0.3 0.4 0.3 0.6  AST 21 22 22 17     ALT 14* 14* 12* 8*  ALKPHOS 55 61 64 54  PROT 6.5 6.4* 7.2 6.6  ALBUMIN 3.5 3.4* 3.6 3.2*    TUMOR MARKERS: No results for input(s): AFPTM, CEA, CA199, CHROMGRNA in the last 8760 hours.  Assessment and Plan:  Recurrent bladder cancer = likely metastatic  Will proceed with placement of Port A Cath today by Dr. Kathlene Cote.  Risks and Benefits discussed with the patient including, but not limited to bleeding, infection, pneumothorax, or fibrin sheath development and need for additional procedures.  All of the patient's questions were answered, patient is agreeable to proceed. Consent signed and in chart.  Thank you for this interesting consult.  I greatly enjoyed meeting Fayrene Fearing and  look forward to participating in their care.  A copy of this report was sent to the requesting provider on this date.  Electronically Signed: Murrell Redden, PA-C 06/18/2017, 12:23 PM   I spent a total of  30 Minutes in face to face in clinical consultation, greater than 50% of which was counseling/coordinating care for [facility] A Cath

## 2017-06-18 NOTE — Discharge Instructions (Signed)
Implanted Port Insertion, Care After °This sheet gives you information about how to care for yourself after your procedure. Your health care provider may also give you more specific instructions. If you have problems or questions, contact your health care provider. °What can I expect after the procedure? °After your procedure, it is common to have: °· Discomfort at the port insertion site. °· Bruising on the skin over the port. This should improve over 3-4 days. ° °Follow these instructions at home: °Port care °· After your port is placed, you will get a manufacturer's information card. The card has information about your port. Keep this card with you at all times. °· Take care of the port as told by your health care provider. Ask your health care provider if you or a family member can get training for taking care of the port at home. A home health care nurse may also take care of the port. °· Make sure to remember what type of port you have. °Incision care °· Follow instructions from your health care provider about how to take care of your port insertion site. Make sure you: °? Wash your hands with soap and water before you change your bandage (dressing). If soap and water are not available, use hand sanitizer. °? Change your dressing as told by your health care provider. °? Leave stitches (sutures), skin glue, or adhesive strips in place. These skin closures may need to stay in place for 2 weeks or longer. If adhesive strip edges start to loosen and curl up, you may trim the loose edges. Do not remove adhesive strips completely unless your health care provider tells you to do that. °· Check your port insertion site every day for signs of infection. Check for: °? More redness, swelling, or pain. °? More fluid or blood. °? Warmth. °? Pus or a bad smell. °General instructions °· Do not take baths, swim, or use a hot tub until your health care provider approves. °· Do not lift anything that is heavier than 10 lb (4.5  kg) for a week, or as told by your health care provider. °· Ask your health care provider when it is okay to: °? Return to work or school. °? Resume usual physical activities or sports. °· Do not drive for 24 hours if you were given a medicine to help you relax (sedative). °· Take over-the-counter and prescription medicines only as told by your health care provider. °· Wear a medical alert bracelet in case of an emergency. This will tell any health care providers that you have a port. °· Keep all follow-up visits as told by your health care provider. This is important. °Contact a health care provider if: °· You cannot flush your port with saline as directed, or you cannot draw blood from the port. °· You have a fever or chills. °· You have more redness, swelling, or pain around your port insertion site. °· You have more fluid or blood coming from your port insertion site. °· Your port insertion site feels warm to the touch. °· You have pus or a bad smell coming from the port insertion site. °Get help right away if: °· You have chest pain or shortness of breath. °· You have bleeding from your port that you cannot control. °Summary °· Take care of the port as told by your health care provider. °· Change your dressing as told by your health care provider. °· Keep all follow-up visits as told by your health care provider. °  This information is not intended to replace advice given to you by your health care provider. Make sure you discuss any questions you have with your health care provider. °Document Released: 09/07/2013 Document Revised: 10/08/2016 Document Reviewed: 10/08/2016 °Elsevier Interactive Patient Education © 2017 [employer] °Implanted Port Home Guide °An implanted port is a type of central line that is placed under the skin. Central lines are used to provide IV access when treatment or nutrition needs to be given through a person’s veins. Implanted ports are used for long-term IV access. An implanted port  may be placed because: °· You need IV medicine that would be irritating to the small veins in your hands or arms. °· You need long-term IV medicines, such as antibiotics. °· You need IV nutrition for a long period. °· You need frequent blood draws for lab tests. °· You need dialysis. ° °Implanted ports are usually placed in the chest area, but they can also be placed in the upper arm, the abdomen, or the leg. An implanted port has two main parts: °· Reservoir. The reservoir is round and will appear as a small, raised area under your skin. The reservoir is the part where a needle is inserted to give medicines or draw blood. °· Catheter. The catheter is a thin, flexible tube that extends from the reservoir. The catheter is placed into a large vein. Medicine that is inserted into the reservoir goes into the catheter and then into the vein. ° °How will I care for my incision site? °Do not get the incision site wet. Bathe or shower as directed by your health care provider. °How is my port accessed? °Special steps must be taken to access the port: °· Before the port is accessed, a numbing cream can be placed on the skin. This helps numb the skin over the port site. °· Your health care provider uses a sterile technique to access the port. °? Your health care provider must put on a mask and sterile gloves. °? The skin over your port is cleaned carefully with an antiseptic and allowed to dry. °? The port is gently pinched between sterile gloves, and a needle is inserted into the port. °· Only "non-coring" port needles should be used to access the port. Once the port is accessed, a blood return should be checked. This helps ensure that the port is in the vein and is not clogged. °· If your port needs to remain accessed for a constant infusion, a clear (transparent) bandage will be placed over the needle site. The bandage and needle will need to be changed every week, or as directed by your health care provider. °· Keep the  bandage covering the needle clean and dry. Do not get it wet. Follow your health care provider’s instructions on how to take a shower or bath while the port is accessed. °· If your port does not need to stay accessed, no bandage is needed over the port. ° °What is flushing? °Flushing helps keep the port from getting clogged. Follow your health care provider’s instructions on how and when to flush the port. Ports are usually flushed with saline solution or a medicine called heparin. The need for flushing will depend on how the port is used. °· If the port is used for intermittent medicines or blood draws, the port will need to be flushed: °? After medicines have been given. °? After blood has been drawn. °? As part of routine maintenance. °· If a constant infusion is   running, the port may not need to be flushed. ° °How long will my port stay implanted? °The port can stay in for as long as your health care provider thinks it is needed. When it is time for the port to come out, surgery will be done to remove it. The procedure is similar to the one performed when the port was put in. °When should I seek immediate medical care? °When you have an implanted port, you should seek immediate medical care if: °· You notice a bad smell coming from the incision site. °· You have swelling, redness, or drainage at the incision site. °· You have more swelling or pain at the port site or the surrounding area. °· You have a fever that is not controlled with medicine. ° °This information is not intended to replace advice given to you by your health care provider. Make sure you discuss any questions you have with your health care provider. °Document Released: 11/17/2005 Document Revised: 04/24/2016 Document Reviewed: 07/25/2013 °Elsevier Interactive Patient Education © 2017 Else[employer] °Moderate Conscious Sedation, Adult, Care After °These instructions provide you with information about caring for yourself after your procedure. Your  health care provider may also give you more specific instructions. Your treatment has been planned according to current medical practices, but problems sometimes occur. Call your health care provider if you have any problems or questions after your procedure. °What can I expect after the procedure? °After your procedure, it is common: °· To feel sleepy for several hours. °· To feel clumsy and have poor balance for several hours. °· To have poor judgment for several hours. °· To vomit if you eat too soon. ° °Follow these instructions at home: °For at least 24 hours after the procedure: ° °· Do not: °? Participate in activities where you could fall or become injured. °? Drive. °? Use heavy machinery. °? Drink alcohol. °? Take sleeping pills or medicines that cause drowsiness. °? Make important decisions or sign legal documents. °? Take care of children on your own. °· Rest. °Eating and drinking °· Follow the diet recommended by your health care provider. °· If you vomit: °? Drink water, juice, or soup when you can drink without vomiting. °? Make sure you have little or no nausea before eating solid foods. °General instructions °· Have a responsible adult stay with you until you are awake and alert. °· Take over-the-counter and prescription medicines only as told by your health care provider. °· If you smoke, do not smoke without supervision. °· Keep all follow-up visits as told by your health care provider. This is important. °Contact a health care provider if: °· You keep feeling nauseous or you keep vomiting. °· You feel light-headed. °· You develop a rash. °· You have a fever. °Get help right away if: °· You have trouble breathing. °This information is not intended to replace advice given to you by your health care provider. Make sure you discuss any questions you have with your health care provider. °Document Released: 09/07/2013 Document Revised: 04/21/2016 Document Reviewed: 03/08/2016 °Elsevier Interactive  Patient Education © 2018 Elsevier Inc. ° °

## 2017-06-22 ENCOUNTER — Encounter (HOSPITAL_COMMUNITY): Payer: Medicare Other

## 2017-06-22 ENCOUNTER — Ambulatory Visit: Payer: Medicare Other | Admitting: Orthopedic Surgery

## 2017-06-22 ENCOUNTER — Ambulatory Visit (HOSPITAL_COMMUNITY): Payer: Medicare Other

## 2017-06-22 ENCOUNTER — Telehealth (HOSPITAL_COMMUNITY): Payer: Self-pay | Admitting: Emergency Medicine

## 2017-06-22 DIAGNOSIS — C670 Malignant neoplasm of trigone of bladder: Secondary | ICD-10-CM

## 2017-06-22 NOTE — Telephone Encounter (Signed)
Daughter misty called and stated that Carl Murphy is trying to self diagnosis himself at home.  He has fallen and is having hip and foot pain and refusing to go to the doctor.  He said he is going to wait to go if it is not better tomorrow.  We have cancelled chemo for today.  She is going to try to et him to the doctor and will call to see if she can get him started this week if not they will start day one on next Monday.

## 2017-06-23 ENCOUNTER — Ambulatory Visit (INDEPENDENT_AMBULATORY_CARE_PROVIDER_SITE_OTHER): Payer: Medicare Other

## 2017-06-23 ENCOUNTER — Ambulatory Visit (INDEPENDENT_AMBULATORY_CARE_PROVIDER_SITE_OTHER): Payer: Medicare Other | Admitting: Orthopedic Surgery

## 2017-06-23 ENCOUNTER — Encounter: Payer: Self-pay | Admitting: Orthopedic Surgery

## 2017-06-23 ENCOUNTER — Ambulatory Visit: Payer: Medicare Other

## 2017-06-23 VITALS — BP 157/78 | HR 72 | Wt 165.0 lb

## 2017-06-23 DIAGNOSIS — M5442 Lumbago with sciatica, left side: Secondary | ICD-10-CM | POA: Diagnosis not present

## 2017-06-23 DIAGNOSIS — M25562 Pain in left knee: Secondary | ICD-10-CM

## 2017-06-23 DIAGNOSIS — M25552 Pain in left hip: Secondary | ICD-10-CM

## 2017-06-23 DIAGNOSIS — M25572 Pain in left ankle and joints of left foot: Secondary | ICD-10-CM | POA: Diagnosis not present

## 2017-06-23 NOTE — Progress Notes (Signed)
NEW PATIENT OFFICE VISIT    Chief Complaint  Patient presents with  . Hip Pain    left   . Knee Pain    left   . Ankle Pain    left    71 year old male with history of bladder cancer presents after falling 14 days ago complains of new  hip knee and ankle pain despite taking oxycodone which he takes for other reasons  He says all 3 areas were heard intermittently sometimes independently sometimes altogether  Denies any history of lumbar disc disease  It has not gotten any better    Review of Systems  Cardiovascular: Negative for chest pain.  Genitourinary: Positive for urgency.  Neurological: Negative for tingling and sensory change.    Past Medical History:  Diagnosis Date  . Arthritis   . Cancer [facility])    Bladder Cancer  . CHF (congestive heart failure) ([place])   . COPD (chronic obstructive pulmonary disease) ([place])   . Depression   . Hard of hearing    bilat   . Hypertension   . Malignant neoplasm of trigone of bladder ([place]) 03/25/2016  . PONV (postoperative nausea and vomiting)   . Shortness of breath dyspnea    heat; walking up set of stairs; pt states can walk up 12-14 stairs slowly without having to stop to catch his breath    Past Surgical History:  Procedure Laterality Date  . CYSTOSCOPY N/A 06/16/2016   Procedure: CYSTOSCOPY;  Surgeon: Franchot Gallo, MD;  Location: [facility];  Service: Urology;  Laterality: N/A;  . IR FLUORO GUIDE PORT INSERTION RIGHT  06/18/2017  . IR US GUIDE VASC ACCESS RIGHT  06/18/2017  . TRANSURETHRAL RESECTION OF BLADDER TUMOR N/A 03/25/2016   Procedure: TRANSURETHRAL RESECTION OF BLADDER TUMOR (TURBT);  Surgeon: Franchot Gallo, MD;  Location: [facility];  Service: Urology;  Laterality: N/A;  . TRANSURETHRAL RESECTION OF BLADDER TUMOR N/A 06/16/2016   Procedure: TRANSURETHRAL RESECTION OF BLADDER TUMOR (TURBT);  Surgeon: Franchot Gallo, MD;  Location: [facility];  Service: Urology;  Laterality: N/A;  . TRANSURETHRAL RESECTION OF BLADDER  TUMOR N/A 12/16/2016   Procedure: TRANSURETHRAL RESECTION OF BLADDER TUMOR (TURBT);  Surgeon: Franchot Gallo, MD;  Location: [facility];  Service: Urology;  Laterality: N/A;  . TRANSURETHRAL RESECTION OF BLADDER TUMOR N/A 05/11/2017   Procedure: TRANSURETHRAL RESECTION OF BLADDER TUMOR (TURBT) CLOT EVACUATION;  Surgeon: Franchot Gallo, MD;  Location: [facility];  Service: Urology;  Laterality: N/A;  1 HR [phone] BLUE MEDICARE-[id number]    Family History  Problem Relation Age of Onset  . Hypertension Mother   . Asthma Father   . Emphysema Father    Social History  Substance Use Topics  . Smoking status: Current Every Day Smoker    Packs/day: 2.00    Years: 50.00    Types: Cigarettes  . Smokeless tobacco: Never Used  . Alcohol use No     Comment: occasionally    BP (!) 157/78   Pulse 72   Wt 165 lb (74.8 kg)   BMI 24.37 kg/m   Physical Exam  Constitutional: He is oriented to person, place, and time. He appears well-developed and well-nourished.  Vital signs have been reviewed and are stable. Gen. appearance the patient is well-developed and well-nourished with normal grooming and hygiene.   Musculoskeletal:  GAIT IS remarkable for slight limp  Neurological: He is alert and oriented to person, place, and time.  Skin: Skin is warm and dry. No erythema.  Psychiatric: He has a  normal mood and affect.  Vitals reviewed.   Left Hip Exam   Tenderness  None  Range of Motion  Normal left hip ROM  Muscle Strength  Normal left hip strength  Right Hip Exam   Tenderness  None  Range of Motion  Normal right hip ROM  Muscle Strength  Normal right hip strength  Comments:  There is no neurovascular deficit on the right  Back Exam   Tenderness  None  Muscle Strength  Hip Abductors:  Hip Adductors: Quadriceps:    5/5 Hamstrings:          X-rays of the pelvis, lumbar spine and left knee were obtained Findings under separate report   Summary no  fractures; OA left knee   Encounter Diagnoses  Name Primary?  . Left hip pain Yes  . Acute pain of left knee   . Acute left ankle pain      PLAN:   Symptomatic treatment.  Apply heat to knee and back as needed   aspercreme over the counter apply as needed   Tylenol 500 mg every 6 hrs as needed   Walker until pain subsides   No fu was scheduled

## 2017-06-23 NOTE — Patient Instructions (Addendum)
Apply heat to knee and back as needed   aspercreme over the counter apply as needed   Tylenol 500 mg every 6 hrs as needed   Walker until pain subsides

## 2017-06-25 ENCOUNTER — Other Ambulatory Visit (HOSPITAL_COMMUNITY): Payer: Self-pay | Admitting: Pharmacist

## 2017-06-25 ENCOUNTER — Other Ambulatory Visit (HOSPITAL_COMMUNITY): Payer: Self-pay | Admitting: Emergency Medicine

## 2017-06-29 ENCOUNTER — Encounter (HOSPITAL_BASED_OUTPATIENT_CLINIC_OR_DEPARTMENT_OTHER): Payer: Medicare Other

## 2017-06-29 ENCOUNTER — Encounter (HOSPITAL_BASED_OUTPATIENT_CLINIC_OR_DEPARTMENT_OTHER): Payer: Medicare Other | Admitting: Oncology

## 2017-06-29 ENCOUNTER — Encounter (HOSPITAL_COMMUNITY): Payer: Self-pay

## 2017-06-29 VITALS — BP 148/58 | HR 62 | Temp 97.8°F | Resp 18 | Wt 165.2 lb

## 2017-06-29 DIAGNOSIS — C670 Malignant neoplasm of trigone of bladder: Secondary | ICD-10-CM | POA: Diagnosis not present

## 2017-06-29 DIAGNOSIS — Z5111 Encounter for antineoplastic chemotherapy: Secondary | ICD-10-CM

## 2017-06-29 LAB — CBC WITH DIFFERENTIAL/PLATELET
Basophils Absolute: 0 10*3/uL (ref 0.0–0.1)
Basophils Relative: 0 %
Eosinophils Absolute: 0.2 10*3/uL (ref 0.0–0.7)
Eosinophils Relative: 3 %
HCT: 29.6 % — ABNORMAL LOW (ref 39.0–52.0)
HEMOGLOBIN: 9.4 g/dL — AB (ref 13.0–17.0)
LYMPHS ABS: 0.6 10*3/uL — AB (ref 0.7–4.0)
LYMPHS PCT: 9 %
MCH: 29.2 pg (ref 26.0–34.0)
MCHC: 31.8 g/dL (ref 30.0–36.0)
MCV: 91.9 fL (ref 78.0–100.0)
MONOS PCT: 9 %
Monocytes Absolute: 0.6 10*3/uL (ref 0.1–1.0)
NEUTROS PCT: 79 %
Neutro Abs: 5.3 10*3/uL (ref 1.7–7.7)
Platelets: 359 10*3/uL (ref 150–400)
RBC: 3.22 MIL/uL — AB (ref 4.22–5.81)
RDW: 15.4 % (ref 11.5–15.5)
WBC: 6.7 10*3/uL (ref 4.0–10.5)

## 2017-06-29 LAB — COMPREHENSIVE METABOLIC PANEL
ALT: 7 U/L — AB (ref 17–63)
ANION GAP: 9 (ref 5–15)
AST: 16 U/L (ref 15–41)
Albumin: 3.2 g/dL — ABNORMAL LOW (ref 3.5–5.0)
Alkaline Phosphatase: 52 U/L (ref 38–126)
BUN: 26 mg/dL — ABNORMAL HIGH (ref 6–20)
CALCIUM: 8.6 mg/dL — AB (ref 8.9–10.3)
CO2: 26 mmol/L (ref 22–32)
CREATININE: 2.18 mg/dL — AB (ref 0.61–1.24)
Chloride: 99 mmol/L — ABNORMAL LOW (ref 101–111)
GFR, EST AFRICAN AMERICAN: 34 mL/min — AB (ref >60)
GFR, EST NON AFRICAN AMERICAN: 29 mL/min — AB (ref >60)
Glucose, Bld: 107 mg/dL — ABNORMAL HIGH (ref 65–99)
Potassium: 4.6 mmol/L (ref 3.5–5.1)
Sodium: 134 mmol/L — ABNORMAL LOW (ref 135–145)
TOTAL PROTEIN: 6.6 g/dL (ref 6.5–8.1)
Total Bilirubin: 0.3 mg/dL (ref 0.3–1.2)

## 2017-06-29 MED ORDER — DEXAMETHASONE SODIUM PHOSPHATE 10 MG/ML IJ SOLN
INTRAMUSCULAR | Status: AC
  Filled 2017-06-29: qty 1

## 2017-06-29 MED ORDER — PALONOSETRON HCL INJECTION 0.25 MG/5ML
0.2500 mg | Freq: Once | INTRAVENOUS | Status: AC
  Administered 2017-06-29: 0.25 mg via INTRAVENOUS

## 2017-06-29 MED ORDER — SODIUM CHLORIDE 0.9 % IV SOLN
297.5000 mg | Freq: Once | INTRAVENOUS | Status: AC
  Administered 2017-06-29: 300 mg via INTRAVENOUS
  Filled 2017-06-29: qty 30

## 2017-06-29 MED ORDER — SODIUM CHLORIDE 0.9 % IV SOLN
Freq: Once | INTRAVENOUS | Status: AC
  Administered 2017-06-29: 11:00:00 via INTRAVENOUS

## 2017-06-29 MED ORDER — SODIUM CHLORIDE 0.9 % IV SOLN: 10.0000 mg | Freq: Once | INTRAVENOUS | Status: DC

## 2017-06-29 MED ORDER — SODIUM CHLORIDE 0.9 % IV SOLN
1000.0000 mg/m2 | Freq: Once | INTRAVENOUS | Status: AC
  Administered 2017-06-29: 1900 mg via INTRAVENOUS
  Filled 2017-06-29: qty 49.97

## 2017-06-29 MED ORDER — SODIUM CHLORIDE 0.9% FLUSH
10.0000 mL | INTRAVENOUS | Status: DC | PRN
  Administered 2017-06-29: 10 mL
  Filled 2017-06-29: qty 10

## 2017-06-29 MED ORDER — PALONOSETRON HCL INJECTION 0.25 MG/5ML
INTRAVENOUS | Status: AC
  Filled 2017-06-29: qty 5

## 2017-06-29 MED ORDER — HEPARIN SOD (PORK) LOCK FLUSH 100 UNIT/ML IV SOLN
500.0000 [IU] | Freq: Once | INTRAVENOUS | Status: AC | PRN
  Administered 2017-06-29: 500 [IU]
  Filled 2017-06-29: qty 5

## 2017-06-29 MED ORDER — DEXAMETHASONE SODIUM PHOSPHATE 10 MG/ML IJ SOLN
10.0000 mg | Freq: Once | INTRAMUSCULAR | Status: AC
  Administered 2017-06-29: 10 mg via INTRAVENOUS

## 2017-06-29 NOTE — Patient Instructions (Signed)
[facility] Discharge Instructions for Patients Receiving Chemotherapy   Beginning January 23rd 2017 lab work for the [facility] will be done in the  Main lab at [facility] on 1st floor. If you have a lab appointment with the [facility] please come in thru the  Main Entrance and check in at the main information desk   Today you received the following chemotherapy agents: Gemzar, Carboplatin  To help prevent nausea and vomiting after your treatment, we encourage you to take your nausea medication as prescribed.   If you develop nausea and vomiting, or diarrhea that is not controlled by your medication, call the clinic.  The clinic phone number is [phone]. Office hours are Monday-Friday 8:30am-5:00pm.  BELOW ARE SYMPTOMS THAT SHOULD BE REPORTED IMMEDIATELY:  *FEVER GREATER THAN 101.0 F  *CHILLS WITH OR WITHOUT FEVER  NAUSEA AND VOMITING THAT IS NOT CONTROLLED WITH YOUR NAUSEA MEDICATION  *UNUSUAL SHORTNESS OF BREATH  *UNUSUAL BRUISING OR BLEEDING  TENDERNESS IN MOUTH AND THROAT WITH OR WITHOUT PRESENCE OF ULCERS  *URINARY PROBLEMS  *BOWEL PROBLEMS  UNUSUAL RASH Items with * indicate a potential emergency and should be followed up as soon as possible. If you have an emergency after office hours please contact your primary care physician or go to the nearest emergency department.  Please call the clinic during office hours if you have any questions or concerns.   You may also contact the Patient Navigator at [phone] should you have any questions or need assistance in obtaining follow up care.      Resources For Cancer Patients and their Caregivers ? American Cancer Society: Can assist with transportation, wigs, general needs, runs Look Good Feel Better.        330 112 5572 ? Cancer Care: Provides financial assistance, online support groups, medication/co-pay assistance.  1-800-813-HOPE 475-567-8317) ? [facility] Assists [place] cancer patients and their families through emotional , educational and financial support.  720-161-2309 ? [place] DSS Where to apply for food stamps, Medicaid and utility assistance. 302-271-9996 ? [facility]: Transportation to medical appointments. 347-644-5296 ? Social Security Administration: May apply for disability if have a Stage IV cancer. 631-358-4773 [phone] ? [employer], Disability and Transit Services: Assists with nutrition, care and transit needs. 512-688-9749

## 2017-06-29 NOTE — Assessment & Plan Note (Addendum)
Stage IV bladder cancer after initially being diagnosed with Stage II (T2N0M0) bladder cancer with invasion of muscle of the trigone of bladder on 03/25/2016, S/P TURBT and definitive treatment with radiation and single agent carboplatin (07/22/2016- 09/08/2016) as a radiosensitizer. Therapy completed in October 2017. He developed recurrent disease based on a TURBT in January 2018 and June 2018. He had a 7 cm tumor removed at that time close the distal ureter.   Pre-treatment labs today: CBC diff, CMET.  I personally reviewed and went over laboratory results with the patient.  The results are noted within this dictation.  Labs satisfy treatment parameters today.  Nursing, in accordance with chemotherapy administration protocol, will monitor for acute side effects/toxicities associated with chemotherapy administration today.  Return next week for Day 8 of cycle 1 treatment.  S/P chemotherapy teaching today.  Return next week for follow-up appointment and toxicity check.

## 2017-06-29 NOTE — Progress Notes (Signed)
Chemotherapy infused today per orders. Patient tolerated infusion without incidence.  Patient discharged ambulatory and in stable condition from clinic.  Patient aware of follow up appointments and a copy has been provided. Patient to follow up as scheduled.

## 2017-06-29 NOTE — Progress Notes (Signed)
Carl Noble, MD [address] [place] [address]  Malignant neoplasm of trigone of bladder [facility])  CURRENT THERAPY: Carboplatin/Gemcitabine day 1 and 8 every 21 days beginning today, 06/29/2017.  INTERVAL HISTORY: Carl Murphy 71 y.o. male returns for followup of Stage IV bladder cancer after initially being diagnosed with Stage II (T2N0M0) bladder cancer with invasion of muscle of the trigone of bladder on 03/25/2016, S/P TURBT and definitive treatment with radiation and single agent carboplatin (07/22/2016- 09/08/2016) as a radiosensitizer. Therapy completed in October 2017. He developed recurrent disease based on a TURBT in January 2018 and June 2018. He had a 7 cm tumor removed at that time close the distal ureter.   Oncology History   Per available records had bladder tumor removed but had extension into the muscularis propria. CT showed no extension beyond the bladder. Has seen 2 urologists in consultation. Adamant about not having a cystectomy and ileal conduit.     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 No findings for local spread of disease or metastatic disease      03/25/2016 Surgery    TURBT 3cm right trigonal bladder tumor. followed by intravesical epirubicin instillation      03/25/2016 Pathology Results    bladder TURBT infiltrative high grade papillary urothelial carcinoma, invades the mulcularis propria (detrusor) no LVI       04/14/2016 Miscellaneous    [name] consultation in [place] for consideration of cystectomy. patient also offered repeat resection with BCG, and CHEMO/XRT      06/17/2016 Pathology Results    TURB- high grade papillary urothelial carcinoma with focal stromal invasion       Radiation Therapy    Planned to be completed on 09/10/2016.      07/22/2016 -  Chemotherapy    The patient had palonosetron (ALOXI) injection  0.25 mg, 0.25 mg, Intravenous,  Once, 2 of 5 cycles Administration: 0.25 mg (07/28/2016)  CARBOplatin (PARAPLATIN) 160 mg in sodium chloride 0.9 % 100 mL chemo infusion, 160 mg (100 % of original dose 160.6 mg), Intravenous,  Once, 2 of 5 cycles Dose modification:   (original dose 160.6 mg, Cycle 1),   (original dose 160.6 mg, Cycle 1),   (original dose 160.6 mg, Cycle 1),   (original dose 160.6 mg, Cycle 2) Administration: 180 mg (07/28/2016)  for chemotherapy treatment.        04/07/2017 Relapse/Recurrence    CT Abd/pelvis: IMPRESSION: 1. Recurrent bladder carcinoma with synchronous disease in the distal right ureter causing severe right hydronephrosis and decreased right renal function. 2. Bilateral renal stones. 3. Aortic atherosclerosis (ICD10-170.0). Coronary artery calcification. 4. Small periumbilical hernia contains a knuckle of unobstructed small bowel.       06/10/2017 PET scan    1. Metachronous urothelial carcinoma within the distal right ureter, as on prior CT. 2. No evidence of nodal metastasis. 3. Bilateral hypermetabolic pulmonary nodules, favoring pulmonary metastasis. Especially given emphysema, 1 or more primary bronchogenic carcinomas cannot be excluded but are felt less likely. 4. Coronary artery atherosclerosis. Aortic Atherosclerosis (ICD10-I70.0). 5. Bladder not well evaluated secondary to hypermetabolic urine within.       06/18/2017 Procedure    Placement of single lumen port a cath via right internal jugular vein. The catheter tip lies at the cavo-atrial junction. A power injectable port a cath was placed and is  ready for immediate use.      06/29/2017 -  Chemotherapy    Carboplatin/Gemcitabine every 21 days         HPI Elements   Location: Bladder with lung mets  Quality: Bladder cancer  Severity: Stage IV  Duration: Dx on 03/25/2016  Context: S/P TURBT, chemoXRT with carboplatin (07/22/2016- 09/08/2016)  Timing: Now with metastatic  disease on PET imaging in July 2018.  Modifying Factors: COPD, CHF  Associated Signs & Symptoms:     Review of Systems  Constitutional: Negative.  Negative for chills, fever and weight loss.  HENT: Negative.   Eyes: Negative.   Respiratory: Negative.  Negative for cough.   Cardiovascular: Negative.  Negative for chest pain.  Gastrointestinal: Negative.  Negative for blood in stool, constipation, diarrhea, melena, nausea and vomiting.  Genitourinary: Negative.   Musculoskeletal: Negative.   Skin: Negative.   Neurological: Negative.  Negative for weakness.  Endo/Heme/Allergies: Negative.   Psychiatric/Behavioral: Negative.     Past Medical History:  Diagnosis Date  . Arthritis   . Cancer [facility])    Bladder Cancer  . CHF (congestive heart failure) ([place])   . COPD (chronic obstructive pulmonary disease) ([place])   . Depression   . Hard of hearing    bilat   . Hypertension   . Malignant neoplasm of trigone of bladder ([place]) 03/25/2016  . PONV (postoperative nausea and vomiting)   . Shortness of breath dyspnea    heat; walking up set of stairs; pt states can walk up 12-14 stairs slowly without having to stop to catch his breath    Past Surgical History:  Procedure Laterality Date  . CYSTOSCOPY N/A 06/16/2016   Procedure: CYSTOSCOPY;  Surgeon: Franchot Gallo, MD;  Location: [facility];  Service: Urology;  Laterality: N/A;  . IR FLUORO GUIDE PORT INSERTION RIGHT  06/18/2017  . IR US GUIDE VASC ACCESS RIGHT  06/18/2017  . TRANSURETHRAL RESECTION OF BLADDER TUMOR N/A 03/25/2016   Procedure: TRANSURETHRAL RESECTION OF BLADDER TUMOR (TURBT);  Surgeon: Franchot Gallo, MD;  Location: [facility];  Service: Urology;  Laterality: N/A;  . TRANSURETHRAL RESECTION OF BLADDER TUMOR N/A 06/16/2016   Procedure: TRANSURETHRAL RESECTION OF BLADDER TUMOR (TURBT);  Surgeon: Franchot Gallo, MD;  Location: [facility];  Service: Urology;  Laterality: N/A;  . TRANSURETHRAL RESECTION OF BLADDER TUMOR N/A 12/16/2016    Procedure: TRANSURETHRAL RESECTION OF BLADDER TUMOR (TURBT);  Surgeon: Franchot Gallo, MD;  Location: [facility];  Service: Urology;  Laterality: N/A;  . TRANSURETHRAL RESECTION OF BLADDER TUMOR N/A 05/11/2017   Procedure: TRANSURETHRAL RESECTION OF BLADDER TUMOR (TURBT) CLOT EVACUATION;  Surgeon: Franchot Gallo, MD;  Location: [facility];  Service: Urology;  Laterality: N/A;  1 HR [phone] BLUE MEDICARE-[id number]    Family History  Problem Relation Age of Onset  . Hypertension Mother   . Asthma Father   . Emphysema Father     Social History   Social History  . Marital status: Married    Spouse name: N/A  . Number of children: N/A  . Years of education: N/A   Social History Main Topics  . Smoking status: Current Every Day Smoker    Packs/day: 2.00    Years: 50.00    Types: Cigarettes  . Smokeless tobacco: Never Used  . Alcohol use No     Comment: occasionally  . Drug use: No  . Sexual activity: No   Other Topics Concern  . Not on file   Social History Narrative  . No narrative  on file     PHYSICAL EXAMINATION  ECOG PERFORMANCE STATUS: 1 - Symptomatic but completely ambulatory  There were no vitals filed for this visit.  Vitals - 1 value per visit 1/61/0960  SYSTOLIC 454  DIASTOLIC 58  Pulse 64  Temperature 97.7  Respirations 18  Weight (lb) 165.2   GENERAL:alert, no distress, well nourished, well developed, anxious, comfortable, cooperative, obese, smiling and accompanied by his daughter, Rojelio Brenner. SKIN: skin color, texture, turgor are normal, no rashes or significant lesions HEAD: Normocephalic, No masses, lesions, tenderness or abnormalities EYES: normal, EOMI EARS: External ears normal OROPHARYNX:lips, buccal mucosa, and tongue normal and mucous membranes are moist  NECK: supple, trachea midline LYMPH:  no palpable lymphadenopathy BREAST:not examined LUNGS: positive findings: wheezing  diffusely HEART: regular rate & rhythm, no murmurs and no  gallops ABDOMEN:abdomen soft, non-tender and normal bowel sounds BACK: Back symmetric, no curvature. EXTREMITIES:less then 2 second capillary refill, no joint deformities, effusion, or inflammation, no skin discoloration, no cyanosis, positive findings:  edema B/L LE edema, 1+ pitting  NEURO: alert & oriented x 3 with fluent speech, no focal motor/sensory deficits, gait normal   LABORATORY DATA: CBC    Component Value Date/Time   WBC 6.7 06/29/2017 0842   RBC 3.22 (L) 06/29/2017 0842   HGB 9.4 (L) 06/29/2017 0842   HCT 29.6 (L) 06/29/2017 0842   PLT 359 06/29/2017 0842   MCV 91.9 06/29/2017 0842   MCH 29.2 06/29/2017 0842   MCHC 31.8 06/29/2017 0842   RDW 15.4 06/29/2017 0842   LYMPHSABS 0.6 (L) 06/29/2017 0842   MONOABS 0.6 06/29/2017 0842   EOSABS 0.2 06/29/2017 0842   BASOSABS 0.0 06/29/2017 0842      Chemistry      Component Value Date/Time   NA 134 (L) 06/29/2017 0842   K 4.6 06/29/2017 0842   CL 99 (L) 06/29/2017 0842   CO2 26 06/29/2017 0842   BUN 26 (H) 06/29/2017 0842   CREATININE 2.18 (H) 06/29/2017 0842      Component Value Date/Time   CALCIUM 8.6 (L) 06/29/2017 0842   ALKPHOS 52 06/29/2017 0842   AST 16 06/29/2017 0842   ALT 7 (L) 06/29/2017 0842   BILITOT 0.3 06/29/2017 0842        PENDING LABS:   RADIOGRAPHIC STUDIES:  Dg Lumbar Spine 2-3 Views  Result Date: 06/23/2017 Radiology report lumbar spine 3 views Status post fall with left hip knee and ankle pain No obvious fracture is seen. The coronal and sagittal plane alignment is slightly abnormal. He may have some mild facet arthrosis L5-S1 area there is calcification of the vessels there is some joint space narrowing at L2 and 3 and T12-L1 Mild spondylosis with coronal and sagittal plane alignment abnormal  Dg Pelvis 1-2 Views  Result Date: 06/23/2017 Pelvis AP to evaluate left hip pain Normal alignment of hip joints with no joint space narrowing or osteophyte formation Normal pelvis   Nm Pet  Image Initial (pi) Skull Base To Thigh  Result Date: 06/10/2017 CLINICAL DATA:  Initial treatment strategy for bladder cancer. EXAM: NUCLEAR MEDICINE PET SKULL BASE TO THIGH TECHNIQUE: 8.8 mCi F-18 FDG was injected intravenously. Full-ring PET imaging was performed from the skull base to thigh after the radiotracer. CT data was obtained and used for attenuation correction and anatomic localization. FASTING BLOOD GLUCOSE:  Value: 100 mg/dl COMPARISON:  Abdominopelvic CTs, most recent 04/07/2017. Chest CT 04/14/2013. FINDINGS: NECK No areas of abnormal hypermetabolism. Bilateral carotid atherosclerosis. No cervical adenopathy. CHEST  No thoracic nodal hypermetabolism. Bilateral hypermetabolic pulmonary nodules. Cavitary left upper lobe pulmonary nodule measures 1.3 cm and a S.U.V. max of 9.7 on image 28/ series 7. Central right middle lobe 1.2 cm nodule measures a S.U.V. max of 3.9 on image 40/series 7. Lingular nodule measures 10 mm and a S.U.V. max of 4.1 on image 54/series 7. Coronary artery atherosclerosis. No thoracic adenopathy. Moderate centrilobular emphysema. ABDOMEN/PELVIS No abdominopelvic nodal hypermetabolism. Hypermetabolism corresponds to the enhancing long segment right ureteric lesion. This measures a S.U.V. max of 6.5, including on image 172/series 4. Urinary contamination about the tip of the penis. Persistent moderate right-sided hydroureteronephrosis with overlying cortical thinning. Advanced abdominal aortic and branch vessel atherosclerosis. No abdominopelvic adenopathy. Fat containing periumbilical hernia. SKELETON No abnormal marrow activity. No focal osseous lesion. IMPRESSION: 1. Metachronous urothelial carcinoma within the distal right ureter, as on prior CT. 2. No evidence of nodal metastasis. 3. Bilateral hypermetabolic pulmonary nodules, favoring pulmonary metastasis. Especially given emphysema, 1 or more primary bronchogenic carcinomas cannot be excluded but are felt less likely. 4.  Coronary artery atherosclerosis. Aortic Atherosclerosis (ICD10-I70.0). 5. Bladder not well evaluated secondary to hypermetabolic urine within. Electronically Signed   By: Abigail Miyamoto M.D.   On: 06/10/2017 15:17   Ir US Guide Vasc Access Right  Result Date: 06/18/2017 CLINICAL DATA:  Metastatic bladder carcinoma and need for porta cath for chemotherapy. EXAM: IMPLANTED PORT A CATH PLACEMENT WITH ULTRASOUND AND FLUOROSCOPIC GUIDANCE ANESTHESIA/SEDATION: 2.0 mg IV Versed; 100 mcg IV Fentanyl Total Moderate Sedation Time:  38 minutes The patient's level of consciousness and physiologic status were continuously monitored during the procedure by Radiology nursing. Additional Medications: 2 g IV Ancef. As antibiotic prophylaxis, Ancef was ordered pre-procedure and administered intravenously within one hour of incision. FLUOROSCOPY TIME:  36 seconds.  9.8 mGy. PROCEDURE: The procedure, risks, benefits, and alternatives were explained to the patient. Questions regarding the procedure were encouraged and answered. The patient understands and consents to the procedure. A time-out was performed prior to initiating the procedure. Ultrasound was utilized to confirm patency of the right internal jugular vein. The right neck and chest were prepped with chlorhexidine in a sterile fashion, and a sterile drape was applied covering the operative field. Maximum barrier sterile technique with sterile gowns and gloves were used for the procedure. Local anesthesia was provided with 1% lidocaine. After creating a small venotomy incision, a 21 gauge needle was advanced into the right internal jugular vein under direct, real-time ultrasound guidance. Ultrasound image documentation was performed. After securing guidewire access, an 8 Fr dilator was placed. A J-wire was kinked to measure appropriate catheter length. A subcutaneous port pocket was then created along the upper chest wall utilizing sharp and blunt dissection. Portable cautery  was utilized. The pocket was irrigated with sterile saline. A single lumen power injectable port was chosen for placement. The 8 Fr catheter was tunneled from the port pocket site to the venotomy incision. The port was placed in the pocket. External catheter was trimmed to appropriate length based on guidewire measurement. At the venotomy, an 8 Fr peel-away sheath was placed over a guidewire. The catheter was then placed through the sheath and the sheath removed. Final catheter positioning was confirmed and documented with a fluoroscopic spot image. The port was accessed with a needle and aspirated and flushed with heparinized saline. The access needle was removed. The venotomy and port pocket incisions were closed with subcutaneous 3-0 Monocryl and subcuticular 4-0 Vicryl. Dermabond was applied to both incisions.  COMPLICATIONS: COMPLICATIONS None FINDINGS: After catheter placement, the tip lies at the cavo-atrial junction. The catheter aspirates normally and is ready for immediate use. IMPRESSION: Placement of single lumen port a cath via right internal jugular vein. The catheter tip lies at the cavo-atrial junction. A power injectable port a cath was placed and is ready for immediate use. Electronically Signed   By: Aletta Edouard M.D.   On: 06/18/2017 15:31   Ir Fluoro Guide Port Insertion Right  Result Date: 06/18/2017 CLINICAL DATA:  Metastatic bladder carcinoma and need for porta cath for chemotherapy. EXAM: IMPLANTED PORT A CATH PLACEMENT WITH ULTRASOUND AND FLUOROSCOPIC GUIDANCE ANESTHESIA/SEDATION: 2.0 mg IV Versed; 100 mcg IV Fentanyl Total Moderate Sedation Time:  38 minutes The patient's level of consciousness and physiologic status were continuously monitored during the procedure by Radiology nursing. Additional Medications: 2 g IV Ancef. As antibiotic prophylaxis, Ancef was ordered pre-procedure and administered intravenously within one hour of incision. FLUOROSCOPY TIME:  36 seconds.  9.8 mGy.  PROCEDURE: The procedure, risks, benefits, and alternatives were explained to the patient. Questions regarding the procedure were encouraged and answered. The patient understands and consents to the procedure. A time-out was performed prior to initiating the procedure. Ultrasound was utilized to confirm patency of the right internal jugular vein. The right neck and chest were prepped with chlorhexidine in a sterile fashion, and a sterile drape was applied covering the operative field. Maximum barrier sterile technique with sterile gowns and gloves were used for the procedure. Local anesthesia was provided with 1% lidocaine. After creating a small venotomy incision, a 21 gauge needle was advanced into the right internal jugular vein under direct, real-time ultrasound guidance. Ultrasound image documentation was performed. After securing guidewire access, an 8 Fr dilator was placed. A J-wire was kinked to measure appropriate catheter length. A subcutaneous port pocket was then created along the upper chest wall utilizing sharp and blunt dissection. Portable cautery was utilized. The pocket was irrigated with sterile saline. A single lumen power injectable port was chosen for placement. The 8 Fr catheter was tunneled from the port pocket site to the venotomy incision. The port was placed in the pocket. External catheter was trimmed to appropriate length based on guidewire measurement. At the venotomy, an 8 Fr peel-away sheath was placed over a guidewire. The catheter was then placed through the sheath and the sheath removed. Final catheter positioning was confirmed and documented with a fluoroscopic spot image. The port was accessed with a needle and aspirated and flushed with heparinized saline. The access needle was removed. The venotomy and port pocket incisions were closed with subcutaneous 3-0 Monocryl and subcuticular 4-0 Vicryl. Dermabond was applied to both incisions. COMPLICATIONS: COMPLICATIONS None FINDINGS:  After catheter placement, the tip lies at the cavo-atrial junction. The catheter aspirates normally and is ready for immediate use. IMPRESSION: Placement of single lumen port a cath via right internal jugular vein. The catheter tip lies at the cavo-atrial junction. A power injectable port a cath was placed and is ready for immediate use. Electronically Signed   By: Aletta Edouard M.D.   On: 06/18/2017 15:31   Dg Knee Ap/lat W/sunrise Left  Result Date: 06/23/2017 Radiology report 3 views left knee status post fall left knee pain Findings varus alignment to the tibiofemoral joint joint space narrowing and subchondral sclerosis and osteophyte formation of the medial compartment No acute fracture seen Impression osteoarthritis left knee    PATHOLOGY:    ASSESSMENT AND PLAN:  Malignant neoplasm of  trigone of bladder ([place]) Stage IV bladder cancer after initially being diagnosed with Stage II (T2N0M0) bladder cancer with invasion of muscle of the trigone of bladder on 03/25/2016, S/P TURBT and definitive treatment with radiation and single agent carboplatin (07/22/2016- 09/08/2016) as a radiosensitizer. Therapy completed in October 2017. He developed recurrent disease based on a TURBT in January 2018 and June 2018. He had a 7 cm tumor removed at that time close the distal ureter.   Pre-treatment labs today: CBC diff, CMET.  I personally reviewed and went over laboratory results with the patient.  The results are noted within this dictation.  Labs satisfy treatment parameters today.  Nursing, in accordance with chemotherapy administration protocol, will monitor for acute side effects/toxicities associated with chemotherapy administration today.  Return next week for Day 8 of cycle 1 treatment.  S/P chemotherapy teaching today.  Return next week for follow-up appointment and toxicity check.   ORDERS PLACED FOR THIS ENCOUNTER: No orders of the defined types were placed in this  encounter.   MEDICATIONS PRESCRIBED THIS ENCOUNTER: No orders of the defined types were placed in this encounter.   THERAPY PLAN:  Palliative chemotherapy with Carboplatin/Gemcitabine in a day 1, 8 every 21 day fashion.  All questions were answered. The patient knows to call the clinic with any problems, questions or concerns. We can certainly see the patient much sooner if necessary.  Patient and plan discussed with Dr. Twana First and she is in agreement with the aforementioned.   This note is electronically signed by: Robynn Pane, PA-C 06/29/2017 10:02 AM

## 2017-06-30 ENCOUNTER — Telehealth (HOSPITAL_COMMUNITY): Payer: Self-pay | Admitting: *Deleted

## 2017-06-30 NOTE — Telephone Encounter (Signed)
Called patient for 24 hour follow up. Patient was sleeping however I spoke with his wife. She states that he hasn't been sick today at all, he has just been sleeping.  He was just "real tired so he's been sleeping a lot".  I advised his wife to have patient return call to clinic today if possible, if not to have him call us tomorrow and let us know how he is doing.  She verbalizes understanding.

## 2017-07-06 ENCOUNTER — Encounter (HOSPITAL_BASED_OUTPATIENT_CLINIC_OR_DEPARTMENT_OTHER): Payer: Medicare Other | Admitting: Oncology

## 2017-07-06 ENCOUNTER — Encounter (HOSPITAL_COMMUNITY): Payer: Self-pay

## 2017-07-06 ENCOUNTER — Encounter (HOSPITAL_COMMUNITY): Payer: Medicare Other | Attending: Oncology

## 2017-07-06 VITALS — BP 132/66 | HR 85 | Temp 98.0°F | Resp 18 | Wt 169.0 lb

## 2017-07-06 DIAGNOSIS — C670 Malignant neoplasm of trigone of bladder: Secondary | ICD-10-CM

## 2017-07-06 DIAGNOSIS — Z5111 Encounter for antineoplastic chemotherapy: Secondary | ICD-10-CM | POA: Diagnosis not present

## 2017-07-06 DIAGNOSIS — C7800 Secondary malignant neoplasm of unspecified lung: Secondary | ICD-10-CM | POA: Diagnosis not present

## 2017-07-06 DIAGNOSIS — Z720 Tobacco use: Secondary | ICD-10-CM | POA: Diagnosis not present

## 2017-07-06 DIAGNOSIS — N1330 Unspecified hydronephrosis: Secondary | ICD-10-CM | POA: Diagnosis not present

## 2017-07-06 LAB — COMPREHENSIVE METABOLIC PANEL
ALT: 12 U/L — ABNORMAL LOW (ref 17–63)
AST: 18 U/L (ref 15–41)
Albumin: 3 g/dL — ABNORMAL LOW (ref 3.5–5.0)
Alkaline Phosphatase: 52 U/L (ref 38–126)
Anion gap: 7 (ref 5–15)
BILIRUBIN TOTAL: 0.7 mg/dL (ref 0.3–1.2)
BUN: 33 mg/dL — AB (ref 6–20)
CO2: 24 mmol/L (ref 22–32)
CREATININE: 1.72 mg/dL — AB (ref 0.61–1.24)
Calcium: 8.4 mg/dL — ABNORMAL LOW (ref 8.9–10.3)
Chloride: 106 mmol/L (ref 101–111)
GFR, EST AFRICAN AMERICAN: 45 mL/min — AB (ref >60)
GFR, EST NON AFRICAN AMERICAN: 39 mL/min — AB (ref >60)
Glucose, Bld: 98 mg/dL (ref 65–99)
POTASSIUM: 4.9 mmol/L (ref 3.5–5.1)
Sodium: 137 mmol/L (ref 135–145)
TOTAL PROTEIN: 6 g/dL — AB (ref 6.5–8.1)

## 2017-07-06 LAB — CBC WITH DIFFERENTIAL/PLATELET
BASOS ABS: 0 10*3/uL (ref 0.0–0.1)
Basophils Relative: 0 %
EOS PCT: 1 %
Eosinophils Absolute: 0 10*3/uL (ref 0.0–0.7)
HEMATOCRIT: 26.2 % — AB (ref 39.0–52.0)
Hemoglobin: 8.6 g/dL — ABNORMAL LOW (ref 13.0–17.0)
LYMPHS ABS: 0.4 10*3/uL — AB (ref 0.7–4.0)
LYMPHS PCT: 12 %
MCH: 29.8 pg (ref 26.0–34.0)
MCHC: 32.8 g/dL (ref 30.0–36.0)
MCV: 90.7 fL (ref 78.0–100.0)
MONO ABS: 0.1 10*3/uL (ref 0.1–1.0)
MONOS PCT: 2 %
NEUTROS ABS: 2.8 10*3/uL (ref 1.7–7.7)
Neutrophils Relative %: 85 %
Platelets: 215 10*3/uL (ref 150–400)
RBC: 2.89 MIL/uL — ABNORMAL LOW (ref 4.22–5.81)
RDW: 15.7 % — AB (ref 11.5–15.5)
WBC: 3.2 10*3/uL — ABNORMAL LOW (ref 4.0–10.5)

## 2017-07-06 MED ORDER — SODIUM CHLORIDE 0.9 % IV SOLN
Freq: Once | INTRAVENOUS | Status: AC
  Administered 2017-07-06: 13:00:00 via INTRAVENOUS

## 2017-07-06 MED ORDER — PROCHLORPERAZINE MALEATE 10 MG PO TABS
10.0000 mg | ORAL_TABLET | Freq: Once | ORAL | Status: AC
  Administered 2017-07-06: 10 mg via ORAL
  Filled 2017-07-06: qty 1

## 2017-07-06 MED ORDER — HEPARIN SOD (PORK) LOCK FLUSH 100 UNIT/ML IV SOLN
500.0000 [IU] | Freq: Once | INTRAVENOUS | Status: AC | PRN
  Administered 2017-07-06: 500 [IU]
  Filled 2017-07-06: qty 5

## 2017-07-06 MED ORDER — SODIUM CHLORIDE 0.9 % IV SOLN
1000.0000 mg/m2 | Freq: Once | INTRAVENOUS | Status: AC
  Administered 2017-07-06: 1900 mg via INTRAVENOUS
  Filled 2017-07-06: qty 49.97

## 2017-07-06 NOTE — Progress Notes (Signed)
[place]  PROGRESS NOTE  Patient Care Team: [name], MD as PCP - General (Internal Medicine)  CHIEF COMPLAINTS/PURPOSE OF CONSULTATION:   Oncology History   Per available records had bladder tumor removed but had extension into the muscularis propria. CT showed no extension beyond the bladder. Has seen 2 urologists in consultation. Adamant about not having a cystectomy and ileal conduit.     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 No findings for local spread of disease or metastatic disease      03/25/2016 Surgery    TURBT 3cm right trigonal bladder tumor. followed by intravesical epirubicin instillation      03/25/2016 Pathology Results    bladder TURBT infiltrative high grade papillary urothelial carcinoma, invades the mulcularis propria (detrusor) no LVI       04/14/2016 Miscellaneous    [name] consultation in [place] for consideration of cystectomy. patient also offered repeat resection with BCG, and CHEMO/XRT      06/17/2016 Pathology Results    TURB- high grade papillary urothelial carcinoma with focal stromal invasion       Radiation Therapy    Planned to be completed on 09/10/2016.      07/22/2016 -  Chemotherapy    The patient had palonosetron (ALOXI) injection 0.25 mg, 0.25 mg, Intravenous,  Once, 2 of 5 cycles Administration: 0.25 mg (07/28/2016)  CARBOplatin (PARAPLATIN) 160 mg in sodium chloride 0.9 % 100 mL chemo infusion, 160 mg (100 % of original dose 160.6 mg), Intravenous,  Once, 2 of 5 cycles Dose modification:   (original dose 160.6 mg, Cycle 1),   (original dose 160.6 mg, Cycle 1),   (original dose 160.6 mg, Cycle 1),   (original dose 160.6 mg, Cycle 2) Administration: 180 mg (07/28/2016)  for chemotherapy treatment.        04/07/2017 Relapse/Recurrence    CT Abd/pelvis: IMPRESSION: 1. Recurrent bladder carcinoma with  synchronous disease in the distal right ureter causing severe right hydronephrosis and decreased right renal function. 2. Bilateral renal stones. 3. Aortic atherosclerosis (ICD10-170.0). Coronary artery calcification. 4. Small periumbilical hernia contains a knuckle of unobstructed small bowel.       06/10/2017 PET scan    1. Metachronous urothelial carcinoma within the distal right ureter, as on prior CT. 2. No evidence of nodal metastasis. 3. Bilateral hypermetabolic pulmonary nodules, favoring pulmonary metastasis. Especially given emphysema, 1 or more primary bronchogenic carcinomas cannot be excluded but are felt less likely. 4. Coronary artery atherosclerosis. Aortic Atherosclerosis (ICD10-I70.0). 5. Bladder not well evaluated secondary to hypermetabolic urine within.       06/18/2017 Procedure    Placement of single lumen port a cath via right internal jugular vein. The catheter tip lies at the cavo-atrial junction. A power injectable port a cath was placed and is ready for immediate use.      06/29/2017 -  Chemotherapy    Carboplatin/Gemcitabine every 21 days       COPD Hypertensive cardiomyopathy  HISTORY OF PRESENTING ILLNESS:  Carl Murphy 71 y.o. male is here for ongoing follow-up of muscle invasive carcinoma of the bladder. He completed concurrent carbo/XRT. Patient has refused cystectomy.  Patient was hospitalized at [facility] from 05/07/17 through 05/12/17 for hematuria. Patient was placed on continuous bladder irrigation for persistent severe hematuria for which she was given 5 units PRBC transfusion. He was seen by his  urologist who taken to the OR on 05/11/17 for cystoscopy, clot evaluation, TURBT 7 cm recurrent bladder tumor and placement of a right double J stent was attempted. Final pathology demonstrated a muscle invasive disease. His hemoglobin at the time of discharge was 8.9 g/dL. Patient underwent CT abdomen and pelvis with and without contrast  on 04/07/17 which demonstrated recurrent bladder carcinoma with synchronous disease in the distal right ureter causing severe right hydronephrosis and decreased right renal function. Dr. Diona Fanti had recommended for patient to undergo a right ureteral stent for his right hydronephrosis by IR, but patient has refused so far. He was seen by Dr. Alen Blew, oncology at [facility], who recommended for patient to get a PET scans rule out metastatic disease and systemic chemotherapy for palliative treatment of his recurrent bladder cancer. Patient underwent a PET scan on 06/10/17 which demonstrated metasynchronous urothelial carcinoma within the distal right ureter, no evidence of nodal metastasis, bilateral hypermetabolic pulmonary nodules favoring pulmonary metastasis.  Today patient presents today for follow up to assess tolerability to carbo/gemzar. Patient is here for cycle 1 day 8 of chemo today. He stated he did well with chemo last week and the only side effect he experienced was some mild fatigue last week. Otherwise he has been doing well except the occasional constipation, which he states resolves when he drinks an Ensure. He denies any N/V/D, appetite changes, chest pain, SOB.   MEDICAL HISTORY:  Past Medical History:  Diagnosis Date  . Arthritis   . Cancer [facility])    Bladder Cancer  . CHF (congestive heart failure) ([place])   . COPD (chronic obstructive pulmonary disease) ([place])   . Depression   . Hard of hearing    bilat   . Hypertension   . Malignant neoplasm of trigone of bladder ([place]) 03/25/2016  . PONV (postoperative nausea and vomiting)   . Shortness of breath dyspnea    heat; walking up set of stairs; pt states can walk up 12-14 stairs slowly without having to stop to catch his breath    SURGICAL HISTORY: Past Surgical History:  Procedure Laterality Date  . CYSTOSCOPY N/A 06/16/2016   Procedure: CYSTOSCOPY;  Surgeon: Franchot Gallo, MD;  Location: [facility];  Service: Urology;  Laterality:  N/A;  . IR FLUORO GUIDE PORT INSERTION RIGHT  06/18/2017  . IR US GUIDE VASC ACCESS RIGHT  06/18/2017  . TRANSURETHRAL RESECTION OF BLADDER TUMOR N/A 03/25/2016   Procedure: TRANSURETHRAL RESECTION OF BLADDER TUMOR (TURBT);  Surgeon: Franchot Gallo, MD;  Location: [facility];  Service: Urology;  Laterality: N/A;  . TRANSURETHRAL RESECTION OF BLADDER TUMOR N/A 06/16/2016   Procedure: TRANSURETHRAL RESECTION OF BLADDER TUMOR (TURBT);  Surgeon: Franchot Gallo, MD;  Location: [facility];  Service: Urology;  Laterality: N/A;  . TRANSURETHRAL RESECTION OF BLADDER TUMOR N/A 12/16/2016   Procedure: TRANSURETHRAL RESECTION OF BLADDER TUMOR (TURBT);  Surgeon: Franchot Gallo, MD;  Location: [facility];  Service: Urology;  Laterality: N/A;  . TRANSURETHRAL RESECTION OF BLADDER TUMOR N/A 05/11/2017   Procedure: TRANSURETHRAL RESECTION OF BLADDER TUMOR (TURBT) CLOT EVACUATION;  Surgeon: Franchot Gallo, MD;  Location: [facility];  Service: Urology;  Laterality: N/A;  1 HR [phone] BLUE [id number]    SOCIAL HISTORY: Social History   Social History  . Marital status: Married    Spouse name: N/A  . Number of children: N/A  . Years of education: N/A   Occupational History  . Not on file.   Social History Main Topics  . Smoking status: Current  Every Day Smoker    Packs/day: 2.00    Years: 50.00    Types: Cigarettes  . Smokeless tobacco: Never Used  . Alcohol use No     Comment: occasionally  . Drug use: No  . Sexual activity: No   Other Topics Concern  . Not on file   Social History Narrative  . 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 Hypertension Mother   . Asthma Father   . Emphysema Father     Both parents deceased Mom was early 33's when she died; he was around 80-something Dad had asthma and emphysema really bad; thinks his heart finally gave out He says his mother's "intestines broke" and it was "fast forward downhill from there."  He is an only child.   ALLERGIES:  has No Known Allergies.  MEDICATIONS:  Current Outpatient Prescriptions  Medication Sig Dispense Refill  . amLODipine (NORVASC) 5 MG tablet 5 mg.  0  . buPROPion (WELLBUTRIN XL) 150 MG 24 hr tablet 150 mg.  5  . CARBOPLATIN IV Inject into the vein. Day 1 every 21 days    . carvedilol (COREG) 25 MG tablet Take 25 mg by mouth 2 (two) times daily.   11  . citalopram (CELEXA) 20 MG tablet Take 20 mg by mouth at bedtime.   11  . dexamethasone (DECADRON) 4 MG tablet Take 2 tablets (8 mg total) by mouth daily. Start the day after carboplatin chemotherapy for 2 days. 30 tablet 1  . fluticasone (FLONASE) 50 MCG/ACT nasal spray Place 2 sprays into both nostrils daily as needed for allergies or rhinitis.    . furosemide (LASIX) 40 MG tablet   1  . Gemcitabine HCl (GEMZAR IV) Inject into the vein. Day 1, day 8 every 21 days    . lidocaine-prilocaine (EMLA) cream Apply to affected area once 30 g 3  . loratadine (CLARITIN) 10 MG tablet Take 10 mg by mouth daily as needed for allergies.    Marland Kitchen losartan-hydrochlorothiazide (HYZAAR) 100-25 MG tablet   11  . ondansetron (ZOFRAN) 8 MG tablet Take 1 tablet (8 mg total) by mouth 2 (two) times daily as needed for refractory nausea / vomiting. Start on day 3 after carboplatin chemo. 30 tablet 1  . oxybutynin (DITROPAN) 5 MG tablet Take 1 tablet (5 mg total) by mouth every 6 (six) hours as needed for bladder spasms. 20 tablet 0  . oxyCODONE (OXY IR/ROXICODONE)  5 MG immediate release tablet 5 mg.  0  . prochlorperazine (COMPAZINE) 10 MG tablet Take 1 tablet (10 mg total) by mouth every 6 (six) hours as needed (Nausea or vomiting). 30 tablet 1   No current facility-administered medications for this visit.    Review of Systems  Constitutional: Positive for malaise/fatigue. Negative for chills and fever.  HENT: Negative.   Eyes: Negative.   Respiratory: Negative for cough and shortness of breath.   Cardiovascular: Negative.  Negative for chest pain.  Gastrointestinal: Negative for constipation and diarrhea.  Genitourinary: Negative for dysuria and hematuria.  Musculoskeletal: Negative.   Skin: Negative.   Neurological: Negative.   Endo/Heme/Allergies: Negative.   Psychiatric/Behavioral: Negative.   All other systems reviewed and are negative. 14 point ROS was done and is otherwise as detailed above or in HPI    PHYSICAL EXAMINATION: ECOG PERFORMANCE STATUS: 1 - Symptomatic but completely ambulatory  Physical Exam  Constitutional: He is oriented to person, place, and time and well-developed, well-nourished, and in no distress. No distress.  HENT:  Head: Normocephalic and atraumatic.  Mouth/Throat: Oropharynx is clear and moist. No oropharyngeal exudate.  Eyes: Pupils are equal, round, and reactive to light. Conjunctivae and EOM are normal. Right eye exhibits no discharge. Left eye exhibits no discharge. No scleral icterus.  Neck: Normal range of motion. Neck supple. No tracheal deviation present. No thyromegaly present.  Cardiovascular: Normal rate, regular rhythm and normal heart sounds.   No murmur heard. Pulmonary/Chest: Effort normal. No respiratory distress. He has no wheezes.  Scattered rhochi bilaterally  Abdominal: Soft. Bowel sounds are normal. He exhibits no distension and no mass. There is no tenderness. There is no rebound and no guarding.  Umbilical hernia  Musculoskeletal: Normal range of motion. He exhibits no edema or  tenderness.  Lymphadenopathy:    He has no cervical adenopathy.  Neurological: He is alert and oriented to person, place, and time. No cranial nerve deficit. Gait normal. Coordination normal.  Skin: Skin is warm and dry. No rash noted. He is not diaphoretic. No erythema.  Psychiatric: Mood, memory, affect and judgment normal.  Nursing note and vitals reviewed.   LABORATORY DATA:  I have reviewed the data as listed Lab Results  Component Value Date   WBC 6.7 06/29/2017   HGB 9.4 (L) 06/29/2017   HCT 29.6 (L) 06/29/2017   MCV 91.9 06/29/2017   PLT 359 06/29/2017   CMP     Component Value Date/Time   NA 134 (L) 06/29/2017 0842   K 4.6 06/29/2017 0842   CL 99 (L) 06/29/2017 0842   CO2 26 06/29/2017 0842   GLUCOSE 107 (H) 06/29/2017 0842   BUN 26 (H) 06/29/2017 0842   CREATININE 2.18 (H) 06/29/2017 0842   CALCIUM 8.6 (L) 06/29/2017 0842   PROT 6.6 06/29/2017 0842   ALBUMIN 3.2 (L) 06/29/2017 0842   AST 16 06/29/2017 0842   ALT 7 (L) 06/29/2017 0842   ALKPHOS 52 06/29/2017 0842   BILITOT 0.3 06/29/2017 0842   GFRNONAA 29 (L) 06/29/2017 0842   GFRAA 34 (L) 06/29/2017 2595     RADIOGRAPHIC STUDIES: I have personally reviewed the radiological images as listed and agreed with the findings in the report. CLINICAL DATA: Hematuria and frequent urination.  EXAM: CT ABDOMEN AND PELVIS WITHOUT AND WITH CONTRAST  TECHNIQUE: Multidetector CT imaging of the abdomen and pelvis was performed following the standard protocol before and following the bolus administration of intravenous contrast.  CONTRAST: 14mL OMNIPAQUE IOHEXOL 300 MG/ML SOLN  COMPARISON: CT scan 03/31/2013  FINDINGS: Lower chest: The lung bases are clear of acute process. No worrisome pulmonary lesions. No pleural effusion. The heart is normal in size. No pericardial effusion. Coronary artery calcifications are noted. Dense distal aortic calcifications are noted. The esophagus is grossly  normal.  Hepatobiliary: Small low-attenuation liver lesions are stable and likely small cysts. Lesion and segment 8 is also stable and likely a hemangioma. No worrisome hepatic lesions or intrahepatic biliary dilatation. The gallbladder is normal. No  common bile duct dilatation.  Pancreas: No mass, inflammation or ductal dilatation.  Spleen: Normal size. No focal lesions.  Adrenals/Urinary Tract: The adrenal glands are normal and stable.  Numerous left renal artery calcifications. I do not see any definite calculi in the left collecting system. Small left renal cysts are noted. The no right renal calculi or hydronephrosis. Both ureters are normal. No ureteral calculi. No significant collecting system abnormality on the delayed images.  There is a large irregular bladder mass at on the right side. This demonstrates moderate contrast enhancement. It measures 3.2 x 3.0 cm. No ureteral obstruction.  Stomach/Bowel: The stomach, duodenum, small bowel and colon are unremarkable without oral contrast. No inflammatory changes, mass lesions or obstructive findings. The terminal ileum is normal. The appendix is normal. Moderate sigmoid diverticulosis without findings for acute diverticulitis.  Vascular/Lymphatic: Advanced aortic and branch vessel atherosclerotic calcifications. No focal aneurysm or dissection. The major venous structures are patent.  No mesenteric or retroperitoneal mass or lymphadenopathy. Small scattered lymph nodes are noted. Small bilateral external iliac artery lymph nodes are stable. No pelvic lymphadenopathy. No inguinal adenopathy. The prostate gland and seminal vesicles are grossly normal. New line small  Small Periumbilical abdominal wall hernia containing fat and a small bowel loop. This has enlarged since prior CT scan.  Other: No subcutaneous lesions or ascites.  Musculoskeletal: No significant bony findings.  IMPRESSION: 1. 3.2 x 3.0  cm bladder mass consistent with transitional cell carcinoma. No findings for local spread of disease or metastatic disease. 2. No worrisome renal or ureteral lesions. 3. Small benign-appearing and stable hepatic lesions. 4. Advanced atherosclerotic calcifications involving the aorta and branch vessels. 5. Small periumbilical abdominal wall hernia containing fat and a small bowel loop.   Electronically Signed  By: Marijo Sanes M.D.  On: 02/18/2016 16:47   ASSESSMENT & PLAN:  Stage II (T2N0M0) Muscle invasive carcinoma of the trigone of the bladder, recurrent disease in 03/2017 with recurrent bladder tumor and new pulm mets. He now has Stage IV disease. Severe right hydronephrosis due to recurrent bladder carcinoma with synchronous disease in the distal right ureter  Tobacco Abuse CKD Anemia Hypotension  Patient is tolerating carboplatin/gemzar well. Proceed with cycle 1 day 8 today. Will plan to restage after 2 cycles of chemo to assess response to treatment. Return to clinic in 3 weeks for follow-up.  This note was electronically signed.    Twana First, MD  07/06/2017 11:23 AM

## 2017-07-06 NOTE — Progress Notes (Signed)
Labs reviewed with Dr. Zhou - okay to tx today per MD.   Tolerated infusion w/o adverse reaction.  Alert, in no distress.  VSS.  Discharged ambulatory.  

## 2017-07-06 NOTE — Patient Instructions (Signed)
[place] at [facility] Discharge Instructions  RECOMMENDATIONS MADE BY THE CONSULTANT AND ANY TEST RESULTS WILL BE SENT TO YOUR REFERRING PHYSICIAN.  Return in 3 weeks, chemo as scheduled  Thank you for choosing [place] at [facility] to provide your oncology and hematology care.  To afford each patient quality time with our provider, please arrive at least 15 minutes before your scheduled appointment time.    If you have a lab appointment with the [facility] please come in thru the  Main Entrance and check in at the main information desk  You need to re-schedule your appointment should you arrive 10 or more minutes late.  We strive to give you quality time with our providers, and arriving late affects you and other patients whose appointments are after yours.  Also, if you no show three or more times for appointments you may be dismissed from the clinic at the providers discretion.     Again, thank you for choosing [facility].  Our hope is that these requests will decrease the amount of time that you wait before being seen by our physicians.       _____________________________________________________________  Should you have questions after your visit to [facility], please contact our office at [phone] between the hours of 8:30 a.m. and 4:30 p.m.  Voicemails left after 4:30 p.m. will not be returned until the following business day.  For prescription refill requests, have your pharmacy contact our office.       Resources For Cancer Patients and their Caregivers ? American Cancer Society: Can assist with transportation, wigs, general needs, runs Look Good Feel Better.        514 303 0203 ? Cancer Care: Provides financial assistance, online support groups, medication/co-pay assistance.  1-800-813-HOPE (347)847-4793) ? [place] Assists [place] cancer patients and their  families through emotional , educational and financial support.  217-077-1743 ? [place] DSS Where to apply for food stamps, Medicaid and utility assistance. 2795746818 ? [facility]: Transportation to medical appointments. 609-391-2632 ? Social Security Administration: May apply for disability if have a Stage IV cancer. 314-023-3741 408-623-6834 ? [employer], Disability and Transit Services: Assists with nutrition, care and transit needs. [place] Support Programs: @10RELATIVEDAYS @ > Cancer Support Group  2nd Tuesday of the month 1pm-2pm, Journey Room  > Creative Journey  3rd Tuesday of the month 1130am-1pm, Journey Room  > Look Good Feel Better  1st Wednesday of the month 10am-12 noon, Journey Room (Call [place] to register [phone])

## 2017-07-13 ENCOUNTER — Ambulatory Visit (HOSPITAL_COMMUNITY): Payer: Medicare Other

## 2017-07-20 ENCOUNTER — Encounter (HOSPITAL_COMMUNITY): Payer: Self-pay

## 2017-07-20 ENCOUNTER — Encounter (HOSPITAL_BASED_OUTPATIENT_CLINIC_OR_DEPARTMENT_OTHER): Payer: Medicare Other

## 2017-07-20 ENCOUNTER — Ambulatory Visit (HOSPITAL_COMMUNITY): Payer: Medicare Other

## 2017-07-20 DIAGNOSIS — C670 Malignant neoplasm of trigone of bladder: Secondary | ICD-10-CM | POA: Diagnosis not present

## 2017-07-20 DIAGNOSIS — Z452 Encounter for adjustment and management of vascular access device: Secondary | ICD-10-CM

## 2017-07-20 LAB — COMPREHENSIVE METABOLIC PANEL
ALBUMIN: 3 g/dL — AB (ref 3.5–5.0)
ALT: 10 U/L — ABNORMAL LOW (ref 17–63)
ANION GAP: 11 (ref 5–15)
AST: 23 U/L (ref 15–41)
Alkaline Phosphatase: 56 U/L (ref 38–126)
BILIRUBIN TOTAL: 0.3 mg/dL (ref 0.3–1.2)
BUN: 23 mg/dL — ABNORMAL HIGH (ref 6–20)
CO2: 24 mmol/L (ref 22–32)
Calcium: 8 mg/dL — ABNORMAL LOW (ref 8.9–10.3)
Chloride: 102 mmol/L (ref 101–111)
Creatinine, Ser: 1.85 mg/dL — ABNORMAL HIGH (ref 0.61–1.24)
GFR calc Af Amer: 41 mL/min — ABNORMAL LOW (ref >60)
GFR calc non Af Amer: 36 mL/min — ABNORMAL LOW (ref >60)
GLUCOSE: 117 mg/dL — AB (ref 65–99)
POTASSIUM: 3.9 mmol/L (ref 3.5–5.1)
Sodium: 137 mmol/L (ref 135–145)
TOTAL PROTEIN: 5.9 g/dL — AB (ref 6.5–8.1)

## 2017-07-20 LAB — CBC WITH DIFFERENTIAL/PLATELET
BASOS ABS: 0 10*3/uL (ref 0.0–0.1)
Basophils Relative: 0 %
Eosinophils Absolute: 0.1 10*3/uL (ref 0.0–0.7)
Eosinophils Relative: 3 %
HCT: 24.8 % — ABNORMAL LOW (ref 39.0–52.0)
Hemoglobin: 8.2 g/dL — ABNORMAL LOW (ref 13.0–17.0)
LYMPHS ABS: 0.5 10*3/uL — AB (ref 0.7–4.0)
Lymphocytes Relative: 28 %
MCH: 30.3 pg (ref 26.0–34.0)
MCHC: 33.1 g/dL (ref 30.0–36.0)
MCV: 91.5 fL (ref 78.0–100.0)
MONOS PCT: 36 %
Monocytes Absolute: 0.6 10*3/uL (ref 0.1–1.0)
NEUTROS ABS: 0.5 10*3/uL — AB (ref 1.7–7.7)
Neutrophils Relative %: 33 %
Platelets: 357 10*3/uL (ref 150–400)
RBC: 2.71 MIL/uL — ABNORMAL LOW (ref 4.22–5.81)
RDW: 16.7 % — AB (ref 11.5–15.5)
WBC: 1.7 10*3/uL — ABNORMAL LOW (ref 4.0–10.5)

## 2017-07-20 MED ORDER — HEPARIN SOD (PORK) LOCK FLUSH 100 UNIT/ML IV SOLN
500.0000 [IU] | Freq: Once | INTRAVENOUS | Status: AC
  Administered 2017-07-20: 500 [IU] via INTRAVENOUS
  Filled 2017-07-20: qty 5

## 2017-07-20 MED ORDER — SODIUM CHLORIDE 0.9% FLUSH
10.0000 mL | Freq: Once | INTRAVENOUS | Status: AC
  Administered 2017-07-20: 10 mL via INTRAVENOUS

## 2017-07-20 NOTE — Patient Instructions (Signed)
[facility] Discharge Instructions for Patients Receiving Chemotherapy  Chemotherapy held due to low white cells.  Use infection control precautions as directed. Keep scheduled appointment as directed and call for any problems.   To help prevent nausea and vomiting after your treatment, we encourage you to take your nausea medication.   If you develop nausea and vomiting that is not controlled by your nausea medication, call the clinic.   BELOW ARE SYMPTOMS THAT SHOULD BE REPORTED IMMEDIATELY:  *FEVER GREATER THAN 100.5 F  *CHILLS WITH OR WITHOUT FEVER  NAUSEA AND VOMITING THAT IS NOT CONTROLLED WITH YOUR NAUSEA MEDICATION  *UNUSUAL SHORTNESS OF BREATH  *UNUSUAL BRUISING OR BLEEDING  TENDERNESS IN MOUTH AND THROAT WITH OR WITHOUT PRESENCE OF ULCERS  *URINARY PROBLEMS  *BOWEL PROBLEMS  UNUSUAL RASH Items with * indicate a potential emergency and should be followed up as soon as possible.  Feel free to call the clinic you have any questions or concerns. The clinic phone number is [phone].  Please show the [place] at check-in to the Emergency Department and triage nurse.

## 2017-07-20 NOTE — Progress Notes (Signed)
Reviewed labs with Dr. Talbert Cage.  Hold chemotherapy today due to low ANC and reschedule for next week.  Reviewed infection precautions with the patient with understanding verbalized.    Port deacessed with no complaints voiced.  Site clean and dry with no bruising or swelling noted at site.  VSS with discharge and ambulatory.

## 2017-07-21 ENCOUNTER — Other Ambulatory Visit (HOSPITAL_COMMUNITY): Payer: Self-pay | Admitting: *Deleted

## 2017-07-21 DIAGNOSIS — K1230 Oral mucositis (ulcerative), unspecified: Secondary | ICD-10-CM | POA: Insufficient documentation

## 2017-07-21 MED ORDER — FIRST-DUKES MOUTHWASH MT SUSP: 5.0000 mL | Freq: Three times a day (TID) | OROMUCOSAL | 1 refills | Status: DC

## 2017-07-23 ENCOUNTER — Other Ambulatory Visit (HOSPITAL_COMMUNITY): Payer: Self-pay | Admitting: Oncology

## 2017-07-24 ENCOUNTER — Encounter (HOSPITAL_BASED_OUTPATIENT_CLINIC_OR_DEPARTMENT_OTHER): Payer: Medicare Other

## 2017-07-24 VITALS — BP 150/57 | HR 79 | Temp 97.5°F | Resp 20 | Wt 177.2 lb

## 2017-07-24 DIAGNOSIS — K1230 Oral mucositis (ulcerative), unspecified: Secondary | ICD-10-CM

## 2017-07-24 DIAGNOSIS — R058 Other specified cough: Secondary | ICD-10-CM | POA: Insufficient documentation

## 2017-07-24 DIAGNOSIS — R05 Cough: Secondary | ICD-10-CM

## 2017-07-24 MED ORDER — GUAIFENESIN ER 600 MG PO TB12: ORAL_TABLET | ORAL | 0 refills | Status: DC

## 2017-07-24 MED ORDER — LIDOCAINE VISCOUS 2 % MT SOLN: OROMUCOSAL | 0 refills | Status: DC

## 2017-07-24 NOTE — Progress Notes (Signed)
Patient came in for a persistent sore throat. No yeast visible in patient's oral cavity/throat.

## 2017-07-24 NOTE — Patient Instructions (Signed)
[place] at [facility] Discharge Instructions  RECOMMENDATIONS MADE BY THE CONSULTANT AND ANY TEST RESULTS WILL BE SENT TO YOUR REFERRING PHYSICIAN.  Continue Magic Mouthwash.   Start taking Lidocaine 3 to 4 times a day before meals to help numb throat.   Continue taking Tylenol for throat pain.   Start taking Claritin 10mg  everyday.  Take Magic Mouthwash prior to taking Lidocaine.   Keep drinking plenty of fluids.   Call if fever or chills develop. Do not ignore a fever or chills.   Call us on Monday if your symptoms persist or are worse - such as green/yellow mucus from nose or coughing it up.       Thank you for choosing [place] at [facility] to provide your oncology and hematology care.  To afford each patient quality time with our provider, please arrive at least 15 minutes before your scheduled appointment time.    If you have a lab appointment with the [place] please come in thru the  Main Entrance and check in at the main information desk  You need to re-schedule your appointment should you arrive 10 or more minutes late.  We strive to give you quality time with our providers, and arriving late affects you and other patients whose appointments are after yours.  Also, if you no show three or more times for appointments you may be dismissed from the clinic at the providers discretion.     Again, thank you for choosing [facility].  Our hope is that these requests will decrease the amount of time that you wait before being seen by our physicians.       _____________________________________________________________  Should you have questions after your visit to [facility], please contact our office at [phone] between the hours of 8:30 a.m. and 4:30 p.m.  Voicemails left after 4:30 p.m. will not be returned until the following business day.  For prescription refill requests, have your  pharmacy contact our office.       Resources For Cancer Patients and their Caregivers ? American Cancer Society: Can assist with transportation, wigs, general needs, runs Look Good Feel Better.        412-805-0243 ? Cancer Care: Provides financial assistance, online support groups, medication/co-pay assistance.  1-800-813-HOPE 7703926492) ? [place] Assists [place] cancer patients and their families through emotional , educational and financial support.  515-888-5245 ? [place] DSS Where to apply for food stamps, Medicaid and utility assistance. (907)371-6301 ? RCATS: Transportation to medical appointments. (843)300-1310 ? Social Security Administration: May apply for disability if have a Stage IV cancer. 630-251-6622 289-447-4035 ? [employer], Disability and Transit Services: Assists with nutrition, care and transit needs. [place] Support Programs: @10RELATIVEDAYS @ > Cancer Support Group  2nd Tuesday of the month 1pm-2pm, Journey Room  > Creative Journey  3rd Tuesday of the month 1130am-1pm, Journey Room  > Look Good Feel Better  1st Wednesday of the month 10am-12 noon, Journey Room (Call [place] to register [phone])

## 2017-07-26 NOTE — Progress Notes (Signed)
[place], [place] [address]   CLINIC:  Medical Oncology/Hematology  PCP:  [name], MD [address] [place] [address] [phone]   REASON FOR VISIT:  Follow-up for Stage IV bladder cancer with lung mets   CURRENT THERAPY: Carbo/Gemzar, beginning 06/29/17   BRIEF ONCOLOGIC HISTORY:  Oncology History   Per available records had bladder tumor removed but had extension into the muscularis propria. CT showed no extension beyond the bladder. Has seen 2 urologists in consultation. Adam[place] about not having a cystectomy and ileal conduit.     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 No findings for local spread of disease or metastatic disease      03/25/2016 Surgery    TURBT 3cm right trigonal bladder tumor. followed by intravesical epirubicin instillation      03/25/2016 Pathology Results    bladder TURBT infiltrative high grade papillary urothelial carcinoma, invades the mulcularis propria (detrusor) no LVI       04/14/2016 Miscellaneous    [name] consultation in [place] for consideration of cystectomy. patient also offered repeat resection with BCG, and CHEMO/XRT      06/17/2016 Pathology Results    TURB- high grade papillary urothelial carcinoma with focal stromal invasion       Radiation Therapy    Planned to be completed on 09/10/2016.      07/22/2016 -  Chemotherapy    The patient had palonosetron (ALOXI) injection 0.25 mg, 0.25 mg, Intravenous,  Once, 2 of 5 cycles Administration: 0.25 mg (07/28/2016)  CARBOplatin (PARAPLATIN) 160 mg in sodium chloride 0.9 % 100 mL chemo infusion, 160 mg (100 % of original dose 160.6 mg), Intravenous,  Once, 2 of 5 cycles Dose modification:   (original dose 160.6 mg, Cycle 1),   (original dose 160.6 mg, Cycle 1),   (original dose 160.6 mg, Cycle 1),   (original dose 160.6  mg, Cycle 2) Administration: 180 mg (07/28/2016)  for chemotherapy treatment.        04/07/2017 Relapse/Recurrence    CT Abd/pelvis: IMPRESSION: 1. Recurrent bladder carcinoma with synchronous disease in the distal right ureter causing severe right hydronephrosis and decreased right renal function. 2. Bilateral renal stones. 3. Aortic atherosclerosis (ICD10-170.0). Coronary artery calcification. 4. Small periumbilical hernia contains a knuckle of unobstructed small bowel.       06/10/2017 PET scan    1. Metachronous urothelial carcinoma within the distal right ureter, as on prior CT. 2. No evidence of nodal metastasis. 3. Bilateral hypermetabolic pulmonary nodules, favoring pulmonary metastasis. Especially given emphysema, 1 or more primary bronchogenic carcinomas cannot be excluded but are felt less likely. 4. Coronary artery atherosclerosis. Aortic Atherosclerosis (ICD10-I70.0). 5. Bladder not well evaluated secondary to hypermetabolic urine within.       06/18/2017 Procedure    Placement of single lumen port a cath via right internal jugular vein. The catheter tip lies at the cavo-atrial junction. A power injectable port a cath was placed and is ready for immediate use.      06/29/2017 -  Chemotherapy    Carboplatin/Gemcitabine every 21 days           INTERVAL HISTORY:  Mr. Fluke 71 y.o. male returns for routine follow-up and consideration for next cycle of chemotherapy.   Due for cycle #2 of Carbo/Gemzar today.   Overall, he tells me he has been  feeling okay. Energy levels 50%; appetite 50%.  He has sore throat and clear nasal drainage since Tues/Wed last week.  He thinks he may have "gotten a cold or something."  Has been using Magic Mouthwash with lidocaine, that only temporarily helps numb his throat.  He has been eating soft foods.  He has been trying Claritin and nose spray. He has productive cough with white phlegm; he has shortness of breath which he  attributes to COPD.  He has mild leg swelling, which has been chronic and he says is d/t CHF.  He struggles with constipation; admittedly may not be drinking enough water recently given his sore throat.    Denies any urinary complaints including hematuria or dysuria. Denies rash or wounds. Denies dizziness, headaches, neuropathy, or anxiety/depression.   Overall, he feels ready for next cycle of chemo today.     REVIEW OF SYSTEMS:  Review of Systems - Oncology per HPI    PAST MEDICAL/SURGICAL HISTORY:  Past Medical History:  Diagnosis Date  . Arthritis   . Cancer [facility])    Bladder Cancer  . CHF (congestive heart failure) ([place])   . COPD (chronic obstructive pulmonary disease) ([place])   . Depression   . Hard of hearing    bilat   . Hypertension   . Malignant neoplasm of trigone of bladder ([place]) 03/25/2016  . PONV (postoperative nausea and vomiting)   . Shortness of breath dyspnea    heat; walking up set of stairs; pt states can walk up 12-14 stairs slowly without having to stop to catch his breath   Past Surgical History:  Procedure Laterality Date  . CYSTOSCOPY N/A 06/16/2016   Procedure: CYSTOSCOPY;  Surgeon: Franchot Gallo, MD;  Location: [facility];  Service: Urology;  Laterality: N/A;  . IR FLUORO GUIDE PORT INSERTION RIGHT  06/18/2017  . IR US GUIDE VASC ACCESS RIGHT  06/18/2017  . TRANSURETHRAL RESECTION OF BLADDER TUMOR N/A 03/25/2016   Procedure: TRANSURETHRAL RESECTION OF BLADDER TUMOR (TURBT);  Surgeon: Franchot Gallo, MD;  Location: [facility];  Service: Urology;  Laterality: N/A;  . TRANSURETHRAL RESECTION OF BLADDER TUMOR N/A 06/16/2016   Procedure: TRANSURETHRAL RESECTION OF BLADDER TUMOR (TURBT);  Surgeon: Franchot Gallo, MD;  Location: [facility];  Service: Urology;  Laterality: N/A;  . TRANSURETHRAL RESECTION OF BLADDER TUMOR N/A 12/16/2016   Procedure: TRANSURETHRAL RESECTION OF BLADDER TUMOR (TURBT);  Surgeon: Franchot Gallo, MD;  Location: [facility];  Service: Urology;   Laterality: N/A;  . TRANSURETHRAL RESECTION OF BLADDER TUMOR N/A 05/11/2017   Procedure: TRANSURETHRAL RESECTION OF BLADDER TUMOR (TURBT) CLOT EVACUATION;  Surgeon: Franchot Gallo, MD;  Location: [facility];  Service: Urology;  Laterality: N/A;  1 HR [phone] BLUE MEDICARE-[id number]     SOCIAL HISTORY:  Social History   Social History  . Marital status: Married    Spouse name: N/A  . Number of children: N/A  . Years of education: N/A   Occupational History  . Not on file.   Social History Main Topics  . Smoking status: Current Every Day Smoker    Packs/day: 2.00    Years: 50.00    Types: Cigarettes  . Smokeless tobacco: Never Used  . Alcohol use No     Comment: occasionally  . Drug use: No  . Sexual activity: No   Other Topics Concern  . Not on file   Social History Narrative  . No narrative on file    FAMILY HISTORY:  Family History  Problem Relation Age of  Onset  . Hypertension Mother   . Asthma Father   . Emphysema Father     CURRENT MEDICATIONS:  Outpatient Encounter Prescriptions as of 07/27/2017  Medication Sig  . amLODipine (NORVASC) 5 MG tablet 5 mg.  Marland Kitchen buPROPion (WELLBUTRIN XL) 150 MG 24 hr tablet 150 mg.  . CARBOPLATIN IV Inject into the vein. Day 1 every 21 days  . carvedilol (COREG) 25 MG tablet Take 25 mg by mouth 2 (two) times daily.   . citalopram (CELEXA) 20 MG tablet Take 20 mg by mouth at bedtime.   Marland Kitchen dexamethasone (DECADRON) 4 MG tablet Take 2 tablets (8 mg total) by mouth daily. Start the day after carboplatin chemotherapy for 2 days.  . Diphenhyd-Hydrocort-Nystatin (FIRST-DUKES MOUTHWASH) SUSP Use as directed 5 mLs in the mouth or throat 4 (four) times daily -  before meals and at bedtime.  . fluticasone (FLONASE) 50 MCG/ACT nasal spray Place 2 sprays into both nostrils daily as needed for allergies or rhinitis.  . furosemide (LASIX) 40 MG tablet   . Gemcitabine HCl (GEMZAR IV) Inject into the vein. Day 1, day 8 every 21 days  .  guaiFENesin (MUCINEX) 600 MG 12 hr tablet Take 1-2 tablets every 12 hours as needed for phlegm.  . lidocaine (XYLOCAINE) 2 % solution Swish and swallow 43ml three to four times a day as needed for throat pain.  Marland Kitchen lidocaine-prilocaine (EMLA) cream Apply to affected area once  . loratadine (CLARITIN) 10 MG tablet Take 10 mg by mouth daily as needed for allergies.  Marland Kitchen losartan-hydrochlorothiazide (HYZAAR) 100-25 MG tablet   . ondansetron (ZOFRAN) 8 MG tablet Take 1 tablet (8 mg total) by mouth 2 (two) times daily as needed for refractory nausea / vomiting. Start on day 3 after carboplatin chemo.  Marland Kitchen oxybutynin (DITROPAN) 5 MG tablet Take 1 tablet (5 mg total) by mouth every 6 (six) hours as needed for bladder spasms.  Marland Kitchen oxyCODONE (OXY IR/ROXICODONE) 5 MG immediate release tablet 5 mg.  . prochlorperazine (COMPAZINE) 10 MG tablet Take 1 tablet (10 mg total) by mouth every 6 (six) hours as needed (Nausea or vomiting).   No facility-administered encounter medications on file as of 07/27/2017.     ALLERGIES:  No Known Allergies   PHYSICAL EXAM:  ECOG Performance status: 1-2 - Symptomatic; requires occasional assistance.      Physical Exam  Constitutional: He is oriented to person, place, and time.  Chronically-ill appearing male in no acute distress   HENT:  Posterior oropharynx red, but no exudate. No evidence of thrush.   Eyes: Pupils are equal, round, and reactive to light. Conjunctivae are normal. No scleral icterus.  Neck: Normal range of motion. Neck supple.  Cardiovascular: Normal rate and regular rhythm.   Pulmonary/Chest: Effort normal. He has wheezes (wheezes throughout).  Abdominal: Soft. Bowel sounds are normal. There is no tenderness.  Musculoskeletal: Normal range of motion. He exhibits no edema.  Lymphadenopathy:    He has no cervical adenopathy.  Neurological: He is alert and oriented to person, place, and time. No cranial nerve deficit.  Skin: Skin is warm and dry. No rash  noted.  Psychiatric: Mood, memory, affect and judgment normal.  Nursing note and vitals reviewed.    LABORATORY DATA:  I have reviewed the labs as listed.  CBC    Component Value Date/Time   WBC 3.9 (L) 07/27/2017 1138   RBC 2.84 (L) 07/27/2017 1138   HGB 8.5 (L) 07/27/2017 1138   HCT 26.0 (L) 07/27/2017  1138   PLT 520 (H) 07/27/2017 1138   MCV 91.5 07/27/2017 1138   MCH 29.9 07/27/2017 1138   MCHC 32.7 07/27/2017 1138   RDW 18.0 (H) 07/27/2017 1138   LYMPHSABS 1.0 07/27/2017 1138   MONOABS 0.9 07/27/2017 1138   EOSABS 0.0 07/27/2017 1138   BASOSABS 0.0 07/27/2017 1138   CMP Latest Ref Rng & Units 07/27/2017 07/20/2017 07/06/2017  Glucose 65 - 99 mg/dL 109(H) 117(H) 98  BUN 6 - 20 mg/dL 21(H) 23(H) 33(H)  Creatinine 0.61 - 1.24 mg/dL 2.32(H) 1.85(H) 1.72(H)  Sodium 135 - 145 mmol/L 135 137 137  Potassium 3.5 - 5.1 mmol/L 4.7 3.9 4.9  Chloride 101 - 111 mmol/L 100(L) 102 106  CO2 22 - 32 mmol/L 26 24 24   Calcium 8.9 - 10.3 mg/dL 8.4(L) 8.0(L) 8.4(L)  Total Protein 6.5 - 8.1 g/dL 6.4(L) 5.9(L) 6.0(L)  Total Bilirubin 0.3 - 1.2 mg/dL 0.3 0.3 0.7  Alkaline Phos 38 - 126 U/L 51 56 52  AST 15 - 41 U/L 22 23 18   ALT 17 - 63 U/L 11(L) 10(L) 12(L)    PENDING LABS:    DIAGNOSTIC IMAGING:  *The following radiologic images and reports have been reviewed independently and agree with below findings.  PET scan: 06/10/17 CLINICAL DATA:  Initial treatment strategy for bladder cancer.  EXAM: NUCLEAR MEDICINE PET SKULL BASE TO THIGH  TECHNIQUE: 8.8 mCi F-18 FDG was injected intravenously. Full-ring PET imaging was performed from the skull base to thigh after the radiotracer. CT data was obtained and used for attenuation correction and anatomic localization.  FASTING BLOOD GLUCOSE:  Value: 100 mg/dl  COMPARISON:  Abdominopelvic CTs, most recent 04/07/2017. Chest CT 04/14/2013.  FINDINGS: NECK  No areas of abnormal hypermetabolism. Bilateral carotid atherosclerosis. No  cervical adenopathy.  CHEST  No thoracic nodal hypermetabolism. Bilateral hypermetabolic pulmonary nodules. Cavitary left upper lobe pulmonary nodule measures 1.3 cm and a S.U.V. max of 9.7 on image 28/ series 7.  Central right middle lobe 1.2 cm nodule measures a S.U.V. max of 3.9 on image 40/series 7.  Lingular nodule measures 10 mm and a S.U.V. max of 4.1 on image 54/series 7.  Coronary artery atherosclerosis. No thoracic adenopathy. Moderate centrilobular emphysema.  ABDOMEN/PELVIS  No abdominopelvic nodal hypermetabolism. Hypermetabolism corresponds to the enhancing long segment right ureteric lesion. This measures a S.U.V. max of 6.5, including on image 172/series 4. Urinary contamination about the tip of the penis.  Persistent moderate right-sided hydroureteronephrosis with overlying cortical thinning. Advanced abdominal aortic and branch vessel atherosclerosis. No abdominopelvic adenopathy. Fat containing periumbilical hernia.  SKELETON  No abnormal marrow activity. No focal osseous lesion.  IMPRESSION: 1. Metachronous urothelial carcinoma within the distal right ureter, as on prior CT. 2. No evidence of nodal metastasis. 3. Bilateral hypermetabolic pulmonary nodules, favoring pulmonary metastasis. Especially given emphysema, 1 or more primary bronchogenic carcinomas cannot be excluded but are felt less likely. 4. Coronary artery atherosclerosis. Aortic Atherosclerosis (ICD10-I70.0). 5. Bladder not well evaluated secondary to hypermetabolic urine within.   Electronically Signed   By: Abigail Miyamoto M.D.   On: 06/10/2017 15:17     PATHOLOGY:  Bladder tumor path: 05/11/17         ASSESSMENT & PLAN:   Stage IV bladder cancer with lung mets:  -Initially diagnosed in 01/2016. Underwent TURBT, followed by intravesical Epirubicin. Underwent consideration with Dr. Tresa Moore (Urology) for cystectomy; patient adamantly declines cystectomy.  Therefore, he proceeded to have chemoXRT with Carbo; completed treatment in 08/2016.  Restaging imaging  in 03/2017 revealed recurrent bladder cancer with synchronous disease in (R) ureter causing severe (R) hydronephrosis and decreased (R) renal function. PET scan on 06/10/17 revealed metachronous urothelial cancer within (R) ureter and bilateral pulmonary nodules favoring mets. Started systemic chemo with Carbo/Gemzar on 06/29/17.  -Due for day 1, cycle #2 Carbo/Gemzar today. Labs reviewed and are adequate for treatment.   -He will be due for short-interval restaging imaging after this cycle to assess response to treatment change thus far. CT chest/abd/pelvis due in ~2-3 weeks; orders placed today.  -Return to cancer center in ~3 weeks after restaging scans and for follow-up visit with Dr. Talbert Cage for subsequent treatment planning.   Sore throat:  -Could be viral pharyngitis vs allergic rhinitis/post-nasal drip vs mucositis/esophagitis d/t chemotherapy.  No oropharyngeal exudate on exam. No evidence of thrush.  -Continue Magic Mouthwash with lidocaine PRN.  -Encouraged him to try a different anti-histamine to see if that may help with nasal drainage.   Increased BUN/CRE:  -Likely d/t decreased oral intake.  -Will give extra 500 mL IVF today. Encouraged him to push non-caffeinated fluid intake as tolerated.    Constipation:  -Recommended he take Miralax once to twice daily.  -Encouraged him to increase fluid intake as tolerated to help with dehydration and constipation as well.      Dispo:  -Restaging CT chest/abd/pelvis due in ~2-3 weeks; orders placed today.  -Return to cancer center as directed for day 8 chemo.  -Return to cancer center in ~3 weeks after restaging imaging for follow-up with Dr. Talbert Cage and subsequent treatment planning & chemo.    All questions were answered to patient's stated satisfaction. Encouraged patient to call with any new concerns or questions before his next visit to  the cancer center and we can certain see him sooner, if needed.    Plan of care discussed with Dr. Talbert Cage, who agrees with the above aforementioned.    Orders placed this encounter:  Orders Placed This Encounter  Procedures  . CT Chest W Contrast  . CT Abdomen Pelvis W Contrast      Mike Craze, NP [place] [phone]

## 2017-07-27 ENCOUNTER — Encounter (HOSPITAL_BASED_OUTPATIENT_CLINIC_OR_DEPARTMENT_OTHER): Payer: Medicare Other | Admitting: Adult Health

## 2017-07-27 ENCOUNTER — Encounter (HOSPITAL_BASED_OUTPATIENT_CLINIC_OR_DEPARTMENT_OTHER): Payer: Medicare Other

## 2017-07-27 ENCOUNTER — Encounter (HOSPITAL_COMMUNITY): Payer: Self-pay

## 2017-07-27 VITALS — BP 166/68 | HR 66 | Temp 98.1°F | Resp 18 | Wt 173.8 lb

## 2017-07-27 DIAGNOSIS — J029 Acute pharyngitis, unspecified: Secondary | ICD-10-CM | POA: Diagnosis not present

## 2017-07-27 DIAGNOSIS — Z5111 Encounter for antineoplastic chemotherapy: Secondary | ICD-10-CM

## 2017-07-27 DIAGNOSIS — C670 Malignant neoplasm of trigone of bladder: Secondary | ICD-10-CM | POA: Diagnosis not present

## 2017-07-27 DIAGNOSIS — E860 Dehydration: Secondary | ICD-10-CM

## 2017-07-27 DIAGNOSIS — C7800 Secondary malignant neoplasm of unspecified lung: Secondary | ICD-10-CM

## 2017-07-27 DIAGNOSIS — R7989 Other specified abnormal findings of blood chemistry: Secondary | ICD-10-CM

## 2017-07-27 LAB — COMPREHENSIVE METABOLIC PANEL
ALT: 11 U/L — ABNORMAL LOW (ref 17–63)
ANION GAP: 9 (ref 5–15)
AST: 22 U/L (ref 15–41)
Albumin: 3.1 g/dL — ABNORMAL LOW (ref 3.5–5.0)
Alkaline Phosphatase: 51 U/L (ref 38–126)
BILIRUBIN TOTAL: 0.3 mg/dL (ref 0.3–1.2)
BUN: 21 mg/dL — ABNORMAL HIGH (ref 6–20)
CHLORIDE: 100 mmol/L — AB (ref 101–111)
CO2: 26 mmol/L (ref 22–32)
Calcium: 8.4 mg/dL — ABNORMAL LOW (ref 8.9–10.3)
Creatinine, Ser: 2.32 mg/dL — ABNORMAL HIGH (ref 0.61–1.24)
GFR, EST AFRICAN AMERICAN: 31 mL/min — AB (ref >60)
GFR, EST NON AFRICAN AMERICAN: 27 mL/min — AB (ref >60)
Glucose, Bld: 109 mg/dL — ABNORMAL HIGH (ref 65–99)
POTASSIUM: 4.7 mmol/L (ref 3.5–5.1)
Sodium: 135 mmol/L (ref 135–145)
Total Protein: 6.4 g/dL — ABNORMAL LOW (ref 6.5–8.1)

## 2017-07-27 LAB — CBC WITH DIFFERENTIAL/PLATELET
Basophils Absolute: 0 10*3/uL (ref 0.0–0.1)
Basophils Relative: 0 %
EOS PCT: 1 %
Eosinophils Absolute: 0 10*3/uL (ref 0.0–0.7)
HEMATOCRIT: 26 % — AB (ref 39.0–52.0)
Hemoglobin: 8.5 g/dL — ABNORMAL LOW (ref 13.0–17.0)
LYMPHS PCT: 27 %
Lymphs Abs: 1 10*3/uL (ref 0.7–4.0)
MCH: 29.9 pg (ref 26.0–34.0)
MCHC: 32.7 g/dL (ref 30.0–36.0)
MCV: 91.5 fL (ref 78.0–100.0)
MONO ABS: 0.9 10*3/uL (ref 0.1–1.0)
MONOS PCT: 23 %
NEUTROS ABS: 1.9 10*3/uL (ref 1.7–7.7)
Neutrophils Relative %: 49 %
PLATELETS: 520 10*3/uL — AB (ref 150–400)
RBC: 2.84 MIL/uL — ABNORMAL LOW (ref 4.22–5.81)
RDW: 18 % — AB (ref 11.5–15.5)
WBC: 3.9 10*3/uL — ABNORMAL LOW (ref 4.0–10.5)

## 2017-07-27 MED ORDER — HEPARIN SOD (PORK) LOCK FLUSH 100 UNIT/ML IV SOLN
500.0000 [IU] | Freq: Once | INTRAVENOUS | Status: AC | PRN
  Administered 2017-07-27: 500 [IU]
  Filled 2017-07-27: qty 5

## 2017-07-27 MED ORDER — SODIUM CHLORIDE 0.9 % IV SOLN
1000.0000 mg/m2 | Freq: Once | INTRAVENOUS | Status: AC
  Administered 2017-07-27: 1900 mg via INTRAVENOUS
  Filled 2017-07-27: qty 49.97

## 2017-07-27 MED ORDER — DEXAMETHASONE SODIUM PHOSPHATE 10 MG/ML IJ SOLN
INTRAMUSCULAR | Status: AC
  Filled 2017-07-27: qty 1

## 2017-07-27 MED ORDER — SODIUM CHLORIDE 0.9 % IV SOLN
Freq: Once | INTRAVENOUS | Status: AC
  Administered 2017-07-27: 14:00:00 via INTRAVENOUS

## 2017-07-27 MED ORDER — PALONOSETRON HCL INJECTION 0.25 MG/5ML
INTRAVENOUS | Status: AC
  Filled 2017-07-27: qty 5

## 2017-07-27 MED ORDER — PALONOSETRON HCL INJECTION 0.25 MG/5ML
0.2500 mg | Freq: Once | INTRAVENOUS | Status: AC
  Administered 2017-07-27: 0.25 mg via INTRAVENOUS

## 2017-07-27 MED ORDER — SODIUM CHLORIDE 0.9 % IV SOLN
287.0000 mg | Freq: Once | INTRAVENOUS | Status: AC
  Administered 2017-07-27: 290 mg via INTRAVENOUS
  Filled 2017-07-27: qty 29

## 2017-07-27 MED ORDER — SODIUM CHLORIDE 0.9 % IV SOLN
INTRAVENOUS | Status: DC
  Administered 2017-07-27: 13:00:00 via INTRAVENOUS

## 2017-07-27 MED ORDER — SODIUM CHLORIDE 0.9% FLUSH
10.0000 mL | INTRAVENOUS | Status: DC | PRN
  Administered 2017-07-27: 10 mL
  Filled 2017-07-27: qty 10

## 2017-07-27 MED ORDER — DEXAMETHASONE SODIUM PHOSPHATE 10 MG/ML IJ SOLN
10.0000 mg | Freq: Once | INTRAMUSCULAR | Status: AC
  Administered 2017-07-27: 10 mg via INTRAVENOUS

## 2017-07-27 NOTE — Progress Notes (Signed)
Patient tolerated chemotherapy without complaints.  Port site clean and dry with no bruising or swelling noted.  Needle intact.  VSS stable with discharge.  Left ambulatory and reminded on next appointment.  Verbalized understanding.

## 2017-07-27 NOTE — Patient Instructions (Signed)
[place] Discharge Instructions for Patients Receiving Chemotherapy  Today you received the following chemotherapy agents Carboplatin and Gemzar.    Start taking Zyrtec and stop the Claritin. Try a few days of Sudafed OTC. Continue Magic Mouthwash.  Also, try salt water/baking soda rinses.   To help prevent nausea and vomiting after your treatment, we encourage you to take your nausea medication.    If you develop nausea and vomiting that is not controlled by your nausea medication, call the clinic.   BELOW ARE SYMPTOMS THAT SHOULD BE REPORTED IMMEDIATELY:  *FEVER GREATER THAN 100.5 F  *CHILLS WITH OR WITHOUT FEVER  NAUSEA AND VOMITING THAT IS NOT CONTROLLED WITH YOUR NAUSEA MEDICATION  *UNUSUAL SHORTNESS OF BREATH  *UNUSUAL BRUISING OR BLEEDING  TENDERNESS IN MOUTH AND THROAT WITH OR WITHOUT PRESENCE OF ULCERS  *URINARY PROBLEMS  *BOWEL PROBLEMS  UNUSUAL RASH Items with * indicate a potential emergency and should be followed up as soon as possible.  Feel free to call the clinic you have any questions or concerns. The clinic phone number is [phone].  Please show the [place] at check-in to the Emergency Department and triage nurse.

## 2017-08-04 ENCOUNTER — Encounter (HOSPITAL_COMMUNITY): Payer: Medicare Other | Attending: Oncology

## 2017-08-04 ENCOUNTER — Encounter (HOSPITAL_COMMUNITY): Payer: Self-pay

## 2017-08-04 ENCOUNTER — Encounter (HOSPITAL_COMMUNITY): Payer: Medicare Other | Admitting: Dietician

## 2017-08-04 VITALS — BP 159/59 | HR 65 | Temp 98.0°F | Resp 18 | Wt 171.0 lb

## 2017-08-04 DIAGNOSIS — Z5111 Encounter for antineoplastic chemotherapy: Secondary | ICD-10-CM

## 2017-08-04 DIAGNOSIS — C670 Malignant neoplasm of trigone of bladder: Secondary | ICD-10-CM | POA: Diagnosis not present

## 2017-08-04 LAB — CBC WITH DIFFERENTIAL/PLATELET
Basophils Absolute: 0 10*3/uL (ref 0.0–0.1)
Basophils Relative: 0 %
Eosinophils Absolute: 0 10*3/uL (ref 0.0–0.7)
Eosinophils Relative: 0 %
HCT: 23.6 % — ABNORMAL LOW (ref 39.0–52.0)
Hemoglobin: 7.6 g/dL — ABNORMAL LOW (ref 13.0–17.0)
LYMPHS ABS: 0.5 10*3/uL — AB (ref 0.7–4.0)
LYMPHS PCT: 8 %
MCH: 29.3 pg (ref 26.0–34.0)
MCHC: 32.2 g/dL (ref 30.0–36.0)
MCV: 91.1 fL (ref 78.0–100.0)
MONO ABS: 0.5 10*3/uL (ref 0.1–1.0)
MONOS PCT: 8 %
Neutro Abs: 5 10*3/uL (ref 1.7–7.7)
Neutrophils Relative %: 84 %
Platelets: 240 10*3/uL (ref 150–400)
RBC: 2.59 MIL/uL — AB (ref 4.22–5.81)
RDW: 18.1 % — AB (ref 11.5–15.5)
WBC: 6 10*3/uL (ref 4.0–10.5)

## 2017-08-04 LAB — COMPREHENSIVE METABOLIC PANEL
ALBUMIN: 2.9 g/dL — AB (ref 3.5–5.0)
ALT: 23 U/L (ref 17–63)
ANION GAP: 8 (ref 5–15)
AST: 21 U/L (ref 15–41)
Alkaline Phosphatase: 48 U/L (ref 38–126)
BUN: 34 mg/dL — ABNORMAL HIGH (ref 6–20)
CO2: 24 mmol/L (ref 22–32)
Calcium: 8.2 mg/dL — ABNORMAL LOW (ref 8.9–10.3)
Chloride: 107 mmol/L (ref 101–111)
Creatinine, Ser: 1.98 mg/dL — ABNORMAL HIGH (ref 0.61–1.24)
GFR calc Af Amer: 38 mL/min — ABNORMAL LOW (ref >60)
GFR calc non Af Amer: 33 mL/min — ABNORMAL LOW (ref >60)
GLUCOSE: 98 mg/dL (ref 65–99)
POTASSIUM: 4.5 mmol/L (ref 3.5–5.1)
SODIUM: 139 mmol/L (ref 135–145)
TOTAL PROTEIN: 6 g/dL — AB (ref 6.5–8.1)
Total Bilirubin: 0.2 mg/dL — ABNORMAL LOW (ref 0.3–1.2)

## 2017-08-04 MED ORDER — PROCHLORPERAZINE MALEATE 10 MG PO TABS
ORAL_TABLET | ORAL | Status: AC
  Filled 2017-08-04: qty 1

## 2017-08-04 MED ORDER — PROCHLORPERAZINE MALEATE 10 MG PO TABS
10.0000 mg | ORAL_TABLET | Freq: Once | ORAL | Status: AC
  Administered 2017-08-04: 10 mg via ORAL

## 2017-08-04 MED ORDER — SODIUM CHLORIDE 0.9% FLUSH: 10.0000 mL | INTRAVENOUS | Status: DC | PRN

## 2017-08-04 MED ORDER — SODIUM CHLORIDE 0.9 % IV SOLN
Freq: Once | INTRAVENOUS | Status: AC
Start: 2017-08-04 — End: 2017-08-04
  Administered 2017-08-04: 12:00:00 via INTRAVENOUS

## 2017-08-04 MED ORDER — HEPARIN SOD (PORK) LOCK FLUSH 100 UNIT/ML IV SOLN
500.0000 [IU] | Freq: Once | INTRAVENOUS | Status: AC | PRN
  Administered 2017-08-04: 500 [IU]
  Filled 2017-08-04: qty 5

## 2017-08-04 MED ORDER — SODIUM CHLORIDE 0.9 % IV SOLN
1000.0000 mg/m2 | Freq: Once | INTRAVENOUS | Status: AC
  Administered 2017-08-04: 1900 mg via INTRAVENOUS
  Filled 2017-08-04: qty 23.7

## 2017-08-04 NOTE — Progress Notes (Signed)
Lab work reviewed with Dr. Talbert Cage. HGB 7.6. Dr. Talbert Cage okay to proceed with treatment. Patient is not symptomatic.

## 2017-08-04 NOTE — Patient Instructions (Signed)
[facility] Discharge Instructions for Patients Receiving Chemotherapy   Beginning January 23rd 2017 lab work for the [facility] will be done in the  Main lab at [facility] on 1st floor. If you have a lab appointment with the [facility] please come in thru the  Main Entrance and check in at the main information desk   Today you received the following chemotherapy agents: Gemzar  To help prevent nausea and vomiting after your treatment, we encourage you to take your nausea medication as prescribed.   If you develop nausea and vomiting, or diarrhea that is not controlled by your medication, call the clinic.  The clinic phone number is [phone]. Office hours are Monday-Friday 8:30am-5:00pm.  BELOW ARE SYMPTOMS THAT SHOULD BE REPORTED IMMEDIATELY:  *FEVER GREATER THAN 101.0 F  *CHILLS WITH OR WITHOUT FEVER  NAUSEA AND VOMITING THAT IS NOT CONTROLLED WITH YOUR NAUSEA MEDICATION  *UNUSUAL SHORTNESS OF BREATH  *UNUSUAL BRUISING OR BLEEDING  TENDERNESS IN MOUTH AND THROAT WITH OR WITHOUT PRESENCE OF ULCERS  *URINARY PROBLEMS  *BOWEL PROBLEMS  UNUSUAL RASH Items with * indicate a potential emergency and should be followed up as soon as possible. If you have an emergency after office hours please contact your primary care physician or go to the nearest emergency department.  Please call the clinic during office hours if you have any questions or concerns.   You may also contact the Patient Navigator at [phone] should you have any questions or need assistance in obtaining follow up care.      Resources For Cancer Patients and their Caregivers ? American Cancer Society: Can assist with transportation, wigs, general needs, runs Look Good Feel Better.        3804171113 ? Cancer Care: Provides financial assistance, online support groups, medication/co-pay assistance.  1-800-813-HOPE 231-664-5155) ? [place] Assists  [place] cancer patients and their families through emotional , educational and financial support.  (925) 105-9675 ? [place] DSS Where to apply for food stamps, Medicaid and utility assistance. (407) 389-9497 ? RCATS: Transportation to medical appointments. 279-449-2340 ? Social Security Administration: May apply for disability if have a Stage IV cancer. [phone] 7601365660 ? [employer], Disability and Transit Services: Assists with nutrition, care and transit needs. [phone]

## 2017-08-04 NOTE — Progress Notes (Signed)
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 As a result, he has a decreased appetite. He has a sore throat of unknown etiology, potentially viral or as side effect of chemo. He has significant fatigue, likely partially related to his decreased fluid/food intake.   Today he denies any N/V/C/D.   Daily eating routine reviewed. He eats 2-3x a day. His breakfast recently has been a gravy biscuit because it is soft. Typically, he would eat bacon, eggs, sausage. His lunch is small, usually only a snack. For dinner last night, he went to a restaurant. Do to him living on his own and having significant fatigue, he eats out frequently since he does not have energy or the support of others to make him meals.  He eats many convenience items, such as soup and icecream. He drinks 1 Ensure a week. He drinks water, gingerale and coffee, which he recently changed to [place] secondary to hydration concerns.  Weight hx reviewed; Appears to have been 190 at his initial time of diagnosis in March of 2017. He maintained his weight relatively well (lost ~5-10 lbs) during chemotherapy in late 2017.  His cancer was found to have recurred in May 2018. He had hospital admission this past June from which he emerged at 170 lbs. He lost to 165 before restarting chemo 7/30. He quickly regained weight, peaking at 177 lbs, but is now slowly losing again. Today's weight 171 lbs.   Nutrition Focused Physical Exam: WDL, no discernible muscle/fat wasting  Medications: Gemzar, Carboplatin, oxycodone, Zofran, Lasix, Lidocaine mouth wash, Losartan HCTZ,   Labs: Albumin: 2.9, BUN/Creat: 34/1.98 (imprved from last week),  Significantly anemic-Hemoglobin 7.6  Wt Readings from Last 10 Encounters:  08/04/17 171 lb (77.6 kg)  07/27/17 173 lb 12.8 oz (78.8 kg)  07/24/17 177 lb 3.2 oz (80.4 kg)  07/20/17 176 lb 9.6 oz (80.1 kg)  07/06/17 169 lb (76.7 kg)  06/29/17 165 lb 3.2 oz (74.9 kg)  06/23/17 165 lb (74.8 kg)  06/11/17 169 lb (76.7 kg)  05/27/17 168 lb 14.4 oz (76.6 kg)  05/07/17 180 lb (81.6 kg)   Anthropometrics:  Height: 5\' 9"   (175.26 cm) Weight: 171 lbs (77.73 kg) BMI: 25.3-overweight  Estimated Energy Needs Kcals: 1950-2100 (25-27 kcal/kg bw) Protein: 93-108g Pro (1.2-1.4 g/kg bw) Fluid: >2 Liters (1 ml/kcal)  NUTRITION DIAGNOSIS: Increased Protein/kcal needs related to advanced cancer and cancer related treatments as evidenced by the nutritional recommendations for this disease state and treatments  MALNUTRITION DIAGNOSIS: N/A, at risk  INTERVENTION:  RD reviewed recommendations for taste changes. Unsure if these are related to chemo or progressive CKD. The only item that he says has its "normal" taste is ice cream. RD recommended sweeter foods, which tend to retain their typical flavor. He doesn't eat much fruit at baseline and is unable to say if these taste different.   Due to decreased intake, RD explained concept of never eating a food by itself. He should add toppings, condiments, dressings, creams, syrups etc to his meals. He reports already adding mayo and catsup to items. Encouraged him to do this with more items. For example, he can add shredded cheese to his soup.  Building on this, patient needs to prioritize  calorie/protein dense foods. RD went over the "Big Three" of cheese, Peanut butter and eggs which are some of the most common foods that are high in both kcals and protein. These items are soft and should be easy for him to swallow.  Alternatively, he can supplement his diet with Ensure or Boost. Went over Ensure program at the cancer center. He was agreeable. He says he cannot  tolerate Vanilla. A chocolate case was ordered. This is his first case.   Discussed importance of hydration. Recommended always keeping a bottle of water next to him to remind him to drink fluids. His urine should be clear, not dark.   He is using tylenol for his throat pain. To combat his throat pain, urged him to premedicate with his before meals instead of taking the medication only after he is already in discomfort.   To combat fatigue, urged to stock household with easy to prepare items such as PB, soup, eggs, cheese etc.   RD gave handout titled "Sore or Irritated Throat" and went over it with him. Provided coupons and RD's contact information.   MONITORING, EVALUATION, GOAL: Weight, intake, throat pain   NEXT VISIT: As needed  Burtis Junes RD, LDN, CNSC Clinical Nutrition Pager: [phone] 08/04/2017 11:44 AM

## 2017-08-04 NOTE — Progress Notes (Signed)
Patient tolerated treatment without incidence. Patient discharged ambulatory and in stable condition from clinic.  Patient to follow up as scheduled. 

## 2017-08-10 ENCOUNTER — Ambulatory Visit (HOSPITAL_COMMUNITY): Payer: Medicare Other

## 2017-08-14 ENCOUNTER — Ambulatory Visit (HOSPITAL_COMMUNITY)
Admission: RE | Admit: 2017-08-14 | Discharge: 2017-08-14 | Disposition: A | Discharge status: Home / Self Care | Payer: Medicare Other | Source: Ambulatory Visit | Attending: Adult Health | Admitting: Adult Health

## 2017-08-14 DIAGNOSIS — C7800 Secondary malignant neoplasm of unspecified lung: Secondary | ICD-10-CM | POA: Diagnosis present

## 2017-08-14 DIAGNOSIS — J439 Emphysema, unspecified: Secondary | ICD-10-CM | POA: Insufficient documentation

## 2017-08-14 DIAGNOSIS — C670 Malignant neoplasm of trigone of bladder: Secondary | ICD-10-CM

## 2017-08-14 DIAGNOSIS — K7689 Other specified diseases of liver: Secondary | ICD-10-CM | POA: Diagnosis not present

## 2017-08-14 DIAGNOSIS — R918 Other nonspecific abnormal finding of lung field: Secondary | ICD-10-CM | POA: Insufficient documentation

## 2017-08-14 DIAGNOSIS — I700 Atherosclerosis of aorta: Secondary | ICD-10-CM | POA: Insufficient documentation

## 2017-08-14 MED ORDER — IOPAMIDOL (ISOVUE-300) INJECTION 61%
100.0000 mL | Freq: Once | INTRAVENOUS | Status: AC | PRN
  Administered 2017-08-14: 80 mL via INTRAVENOUS

## 2017-08-17 ENCOUNTER — Encounter (HOSPITAL_COMMUNITY): Payer: Self-pay

## 2017-08-17 ENCOUNTER — Encounter (HOSPITAL_BASED_OUTPATIENT_CLINIC_OR_DEPARTMENT_OTHER): Payer: Medicare Other | Admitting: Oncology

## 2017-08-17 ENCOUNTER — Ambulatory Visit (HOSPITAL_COMMUNITY): Payer: Medicare Other

## 2017-08-17 ENCOUNTER — Encounter (HOSPITAL_BASED_OUTPATIENT_CLINIC_OR_DEPARTMENT_OTHER): Payer: Medicare Other

## 2017-08-17 VITALS — BP 143/54 | HR 70 | Temp 98.6°F | Resp 16 | Wt 167.4 lb

## 2017-08-17 DIAGNOSIS — D649 Anemia, unspecified: Secondary | ICD-10-CM | POA: Diagnosis not present

## 2017-08-17 DIAGNOSIS — C7800 Secondary malignant neoplasm of unspecified lung: Secondary | ICD-10-CM | POA: Diagnosis not present

## 2017-08-17 DIAGNOSIS — T451X5A Adverse effect of antineoplastic and immunosuppressive drugs, initial encounter: Secondary | ICD-10-CM

## 2017-08-17 DIAGNOSIS — C670 Malignant neoplasm of trigone of bladder: Secondary | ICD-10-CM

## 2017-08-17 DIAGNOSIS — K1230 Oral mucositis (ulcerative), unspecified: Secondary | ICD-10-CM | POA: Diagnosis not present

## 2017-08-17 DIAGNOSIS — Z720 Tobacco use: Secondary | ICD-10-CM | POA: Diagnosis not present

## 2017-08-17 DIAGNOSIS — N189 Chronic kidney disease, unspecified: Secondary | ICD-10-CM | POA: Diagnosis not present

## 2017-08-17 DIAGNOSIS — D6481 Anemia due to antineoplastic chemotherapy: Secondary | ICD-10-CM

## 2017-08-17 LAB — COMPREHENSIVE METABOLIC PANEL
ALT: 11 U/L — AB (ref 17–63)
AST: 16 U/L (ref 15–41)
Albumin: 2.6 g/dL — ABNORMAL LOW (ref 3.5–5.0)
Alkaline Phosphatase: 55 U/L (ref 38–126)
Anion gap: 9 (ref 5–15)
BUN: 23 mg/dL — AB (ref 6–20)
CHLORIDE: 103 mmol/L (ref 101–111)
CO2: 26 mmol/L (ref 22–32)
CREATININE: 2.39 mg/dL — AB (ref 0.61–1.24)
Calcium: 8.2 mg/dL — ABNORMAL LOW (ref 8.9–10.3)
GFR calc Af Amer: 30 mL/min — ABNORMAL LOW (ref >60)
GFR calc non Af Amer: 26 mL/min — ABNORMAL LOW (ref >60)
Glucose, Bld: 101 mg/dL — ABNORMAL HIGH (ref 65–99)
Potassium: 4.3 mmol/L (ref 3.5–5.1)
SODIUM: 138 mmol/L (ref 135–145)
Total Bilirubin: 0.2 mg/dL — ABNORMAL LOW (ref 0.3–1.2)
Total Protein: 6.3 g/dL — ABNORMAL LOW (ref 6.5–8.1)

## 2017-08-17 LAB — CBC WITH DIFFERENTIAL/PLATELET
BASOS PCT: 0 %
Basophils Absolute: 0 10*3/uL (ref 0.0–0.1)
EOS ABS: 0.1 10*3/uL (ref 0.0–0.7)
EOS PCT: 1 %
HCT: 19.9 % — ABNORMAL LOW (ref 39.0–52.0)
Hemoglobin: 6.3 g/dL — CL (ref 13.0–17.0)
Lymphocytes Relative: 13 %
Lymphs Abs: 0.7 10*3/uL (ref 0.7–4.0)
MCH: 28.8 pg (ref 26.0–34.0)
MCHC: 31.7 g/dL (ref 30.0–36.0)
MCV: 90.9 fL (ref 78.0–100.0)
MONOS PCT: 13 %
Monocytes Absolute: 0.8 10*3/uL (ref 0.1–1.0)
NEUTROS PCT: 72 %
Neutro Abs: 4 10*3/uL (ref 1.7–7.7)
PLATELETS: 545 10*3/uL — AB (ref 150–400)
RBC: 2.19 MIL/uL — AB (ref 4.22–5.81)
RDW: 19 % — AB (ref 11.5–15.5)
WBC: 5.6 10*3/uL (ref 4.0–10.5)

## 2017-08-17 LAB — PREPARE RBC (CROSSMATCH)

## 2017-08-17 MED ORDER — HEPARIN SOD (PORK) LOCK FLUSH 100 UNIT/ML IV SOLN: 500.0000 [IU] | Freq: Once | INTRAVENOUS | Status: DC

## 2017-08-17 MED ORDER — SODIUM CHLORIDE 0.9 % IV SOLN
INTRAVENOUS | Status: DC
  Administered 2017-08-17: 12:00:00 via INTRAVENOUS

## 2017-08-17 MED ORDER — LIDOCAINE VISCOUS 2 % MT SOLN: OROMUCOSAL | 2 refills | Status: DC

## 2017-08-17 MED ORDER — SODIUM CHLORIDE 0.9% FLUSH: 10.0000 mL | INTRAVENOUS | Status: DC | PRN

## 2017-08-17 NOTE — Patient Instructions (Signed)
[place] at [facility] Discharge Instructions  RECOMMENDATIONS MADE BY THE CONSULTANT AND ANY TEST RESULTS WILL BE SENT TO YOUR REFERRING PHYSICIAN.  One liter of fluids today 2 units of blood tomorrow. Will try treatment next week  Follow up as scheduled.  Thank you for choosing [place] at [facility] to provide your oncology and hematology care.  To afford each patient quality time with our provider, please arrive at least 15 minutes before your scheduled appointment time.    If you have a lab appointment with the [facility] please come in thru the  Main Entrance and check in at the main information desk  You need to re-schedule your appointment should you arrive 10 or more minutes late.  We strive to give you quality time with our providers, and arriving late affects you and other patients whose appointments are after yours.  Also, if you no show three or more times for appointments you may be dismissed from the clinic at the providers discretion.     Again, thank you for choosing [facility].  Our hope is that these requests will decrease the amount of time that you wait before being seen by our physicians.       _____________________________________________________________  Should you have questions after your visit to [facility], please contact our office at [phone] between the hours of 8:30 a.m. and 4:30 p.m.  Voicemails left after 4:30 p.m. will not be returned until the following business day.  For prescription refill requests, have your pharmacy contact our office.       Resources For Cancer Patients and their Caregivers ? American Cancer Society: Can assist with transportation, wigs, general needs, runs Look Good Feel Better.        (838)736-5844 ? Cancer Care: Provides financial assistance, online support groups, medication/co-pay assistance.  1-800-813-HOPE 3851199035) ? [facility] Assists [place] cancer patients and their families through emotional , educational and financial support.  904-695-6208 ? [place] DSS Where to apply for food stamps, Medicaid and utility assistance. 260-087-9832 ? [employer]: Transportation to medical appointments. 9416906529 ? Social Security Administration: May apply for disability if have a Stage IV cancer. (281)793-1682 615-417-2229 ? [employer], Disability and Transit Services: Assists with nutrition, care and transit needs. [place] Support Programs: @10RELATIVEDAYS @ > Cancer Support Group  2nd Tuesday of the month 1pm-2pm, Journey Room  > Creative Journey  3rd Tuesday of the month 1130am-1pm, Journey Room  > Look Good Feel Better  1st Wednesday of the month 10am-12 noon, Journey Room (Call [place] to register [phone])

## 2017-08-17 NOTE — Patient Instructions (Signed)
[facility] at [facility] Discharge Instructions  RECOMMENDATIONS MADE BY THE CONSULTANT AND ANY TEST RESULTS WILL BE SENT TO YOUR REFERRING PHYSICIAN.  You saw Dr. Zhou today.  Thank you for choosing [facility] at [facility] to provide your oncology and hematology care.  To afford each patient quality time with our provider, please arrive at least 15 minutes before your scheduled appointment time.    If you have a lab appointment with the Cancer Center please come in thru the  Main Entrance and check in at the main information desk  You need to re-schedule your appointment should you arrive 10 or more minutes late.  We strive to give you quality time with our providers, and arriving late affects you and other patients whose appointments are after yours.  Also, if you no show three or more times for appointments you may be dismissed from the clinic at the providers discretion.     Again, thank you for choosing [facility].  Our hope is that these requests will decrease the amount of time that you wait before being seen by our physicians.       _____________________________________________________________  Should you have questions after your visit to [facility], please contact our office at [phone] between the hours of 8:30 a.m. and 4:30 p.m.  Voicemails left after 4:30 p.m. will not be returned until the following business day.  For prescription refill requests, have your pharmacy contact our office.       Resources For Cancer Patients and their Caregivers ? American Cancer Society: Can assist with transportation, wigs, general needs, runs Look Good Feel Better.        1-888-227-6333 ? Cancer Care: Provides financial assistance, online support groups, medication/co-pay assistance.  1-800-813-HOPE (4673) ? [facility] Assists [place] cancer patients and their families through  emotional , educational and financial support.  336-427-4357 ? [place] DSS Where to apply for food stamps, Medicaid and utility assistance. 336-342-1394 ? [employer]: Transportation to medical appointments. 336-347-2287 ? Social Security Administration: May apply for disability if have a Stage IV cancer. 336-342-7796 1-800-772-1213 ? [place] [facility], Disability and Transit Services: Assists with nutrition, care and transit needs. 336-349-2343  Cancer Center Support Programs: @10RELATIVEDAYS@ > Cancer Support Group  2nd Tuesday of the month 1pm-2pm, Journey Room  > Creative Journey  3rd Tuesday of the month 1130am-1pm, Journey Room  > Look Good Feel Better  1st Wednesday of the month 10am-12 noon, Journey Room (Call [employer] to register 1-800-395-5775)    

## 2017-08-17 NOTE — Progress Notes (Signed)
[place]  PROGRESS NOTE  Patient Care Team: [name], MD as PCP - General (Internal Medicine)  CHIEF COMPLAINTS/PURPOSE OF CONSULTATION:   Oncology History   Per available records had bladder tumor removed but had extension into the muscularis propria. CT showed no extension beyond the bladder. Has seen 2 urologists in consultation. Adamant about not having a cystectomy and ileal conduit.     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 No findings for local spread of disease or metastatic disease      03/25/2016 Surgery    TURBT 3cm right trigonal bladder tumor. followed by intravesical epirubicin instillation      03/25/2016 Pathology Results    bladder TURBT infiltrative high grade papillary urothelial carcinoma, invades the mulcularis propria (detrusor) no LVI       04/14/2016 Miscellaneous    [name] consultation in [place] for consideration of cystectomy. patient also offered repeat resection with BCG, and CHEMO/XRT      06/17/2016 Pathology Results    TURB- high grade papillary urothelial carcinoma with focal stromal invasion       Radiation Therapy    Planned to be completed on 09/10/2016.      07/22/2016 -  Chemotherapy    The patient had palonosetron (ALOXI) injection 0.25 mg, 0.25 mg, Intravenous,  Once, 2 of 5 cycles Administration: 0.25 mg (07/28/2016)  CARBOplatin (PARAPLATIN) 160 mg in sodium chloride 0.9 % 100 mL chemo infusion, 160 mg (100 % of original dose 160.6 mg), Intravenous,  Once, 2 of 5 cycles Dose modification:   (original dose 160.6 mg, Cycle 1),   (original dose 160.6 mg, Cycle 1),   (original dose 160.6 mg, Cycle 1),   (original dose 160.6 mg, Cycle 2) Administration: 180 mg (07/28/2016)  for chemotherapy treatment.        04/07/2017 Relapse/Recurrence    CT Abd/pelvis: IMPRESSION: 1. Recurrent bladder carcinoma with  synchronous disease in the distal right ureter causing severe right hydronephrosis and decreased right renal function. 2. Bilateral renal stones. 3. Aortic atherosclerosis (ICD10-170.0). Coronary artery calcification. 4. Small periumbilical hernia contains a knuckle of unobstructed small bowel.       06/10/2017 PET scan    1. Metachronous urothelial carcinoma within the distal right ureter, as on prior CT. 2. No evidence of nodal metastasis. 3. Bilateral hypermetabolic pulmonary nodules, favoring pulmonary metastasis. Especially given emphysema, 1 or more primary bronchogenic carcinomas cannot be excluded but are felt less likely. 4. Coronary artery atherosclerosis. Aortic Atherosclerosis (ICD10-I70.0). 5. Bladder not well evaluated secondary to hypermetabolic urine within.       06/18/2017 Procedure    Placement of single lumen port a cath via right internal jugular vein. The catheter tip lies at the cavo-atrial junction. A power injectable port a cath was placed and is ready for immediate use.      06/29/2017 -  Chemotherapy    Carboplatin/Gemcitabine every 21 days       08/14/2017 Imaging    CT C/A/P IMPRESSION: 1. Overall stable pulmonary nodules. Knee soft tissue rim around the cavitary lesion in the left upper lobe is slightly thinner. No new pulmonary nodules or progressive findings in the chest. 2. Stable advanced emphysematous changes and pulmonary scarring. 3. No mediastinal or hilar mass or lymphadenopathy. 4. Improved CT appearance of the bladder. The asymmetric areas of bladder wall enhancement have improved. There is  still diffuse irregular bladder wall thickening and distal right ureteral tumor appears stable. Stable right-sided hydroureteronephrosis. 5. Stable cirrhotic changes involving the liver and stable liver cysts. 6. Stable scattered mesenteric and retroperitoneal and pelvic lymph nodes but no mass or overt adenopathy. 7. Stable severe/advanced  atherosclerotic calcifications involving the thoracic and abdominal aorta and branch vessels.      COPD Hypertensive cardiomyopathy  HISTORY OF PRESENTING ILLNESS:  Carl Murphy 71 y.o. male is here for ongoing follow-up of muscle invasive carcinoma of the bladder. He completed concurrent carbo/XRT. Patient has refused cystectomy.  Patient was hospitalized at [facility] from 05/07/17 through 05/12/17 for hematuria. Patient was placed on continuous bladder irrigation for persistent severe hematuria for which she was given 5 units PRBC transfusion. He was seen by his urologist who taken to the OR on 05/11/17 for cystoscopy, clot evaluation, TURBT 7 cm recurrent bladder tumor and placement of a right double J stent was attempted. Final pathology demonstrated a muscle invasive disease. His hemoglobin at the time of discharge was 8.9 g/dL. Patient underwent CT abdomen and pelvis with and without contrast on 04/07/17 which demonstrated recurrent bladder carcinoma with synchronous disease in the distal right ureter causing severe right hydronephrosis and decreased right renal function. Dr. Diona Fanti had recommended for patient to undergo a right ureteral stent for his right hydronephrosis by IR, but patient has refused so far. He was seen by Dr. Alen Blew, oncology at [facility], who recommended for patient to get a PET scans rule out metastatic disease and systemic chemotherapy for palliative treatment of his recurrent bladder cancer. Patient underwent a PET scan on 06/10/17 which demonstrated metasynchronous urothelial carcinoma within the distal right ureter, no evidence of nodal metastasis, bilateral hypermetabolic pulmonary nodules favoring pulmonary metastasis.  Today patient presents today for follow up for chemo and review of his CT scans. Patient is here for cycle 3 day 1 of chemo today. Patient states that he's noted some mucositis and had a small ulcer on the right lateral aspect of his tongue which  has healed now. He is using magic mouthwash with improvement in his symptoms. He still complains that food does not taste that good but he makes sure he eats. He denies any N/V/D, appetite changes, chest pain, SOB, neuropathy. He states he's noted that his urination has improved and he does not need to urinate as frequently.   MEDICAL HISTORY:  Past Medical History:  Diagnosis Date  . Arthritis   . Cancer [facility])    Bladder Cancer  . CHF (congestive heart failure) ([place])   . COPD (chronic obstructive pulmonary disease) ([place])   . Depression   . Hard of hearing    bilat   . Hypertension   . Malignant neoplasm of trigone of bladder ([place]) 03/25/2016  . PONV (postoperative nausea and vomiting)   . Shortness of breath dyspnea    heat; walking up set of stairs; pt states can walk up 12-14 stairs slowly without having to stop to catch his breath    SURGICAL HISTORY: Past Surgical History:  Procedure Laterality Date  . CYSTOSCOPY N/A 06/16/2016   Procedure: CYSTOSCOPY;  Surgeon: Franchot Gallo, MD;  Location: [facility];  Service: Urology;  Laterality: N/A;  . IR FLUORO GUIDE PORT INSERTION RIGHT  06/18/2017  . IR US GUIDE VASC ACCESS RIGHT  06/18/2017  . TRANSURETHRAL RESECTION OF BLADDER TUMOR N/A 03/25/2016   Procedure: TRANSURETHRAL RESECTION OF BLADDER TUMOR (TURBT);  Surgeon: Franchot Gallo, MD;  Location: [facility];  Service: Urology;  Laterality: N/A;  . TRANSURETHRAL RESECTION OF BLADDER TUMOR N/A 06/16/2016   Procedure: TRANSURETHRAL RESECTION OF BLADDER TUMOR (TURBT);  Surgeon: Franchot Gallo, MD;  Location: [facility];  Service: Urology;  Laterality: N/A;  . TRANSURETHRAL RESECTION OF BLADDER TUMOR N/A 12/16/2016   Procedure: TRANSURETHRAL RESECTION OF BLADDER TUMOR (TURBT);  Surgeon: Franchot Gallo, MD;  Location: [facility];  Service: Urology;  Laterality: N/A;  . TRANSURETHRAL RESECTION OF BLADDER TUMOR N/A 05/11/2017   Procedure: TRANSURETHRAL RESECTION OF BLADDER TUMOR (TURBT) CLOT  EVACUATION;  Surgeon: Franchot Gallo, MD;  Location: [facility];  Service: Urology;  Laterality: N/A;  1 HR [phone] BLUE MEDICARE-[id number]    SOCIAL HISTORY: Social History   Social History  . Marital status: Married    Spouse name: N/A  . Number of children: N/A  . Years of education: N/A   Occupational History  . Not on file.   Social History Main Topics  . Smoking status: Current Every Day Smoker    Packs/day: 2.00    Years: 50.00    Types: Cigarettes  . Smokeless tobacco: Never Used  . Alcohol use No     Comment: occasionally  . Drug use: No  . Sexual activity: No   Other Topics Concern  . Not on file   Social History Narrative  . 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 Hypertension Mother   . Asthma Father   . Emphysema Father     Both parents deceased Mom was early 82's when she died; he was around 80-something Dad had asthma and emphysema really bad; thinks his heart finally gave out He says his mother's "intestines broke" and it was "fast forward downhill from there."  He is an only child.   ALLERGIES:  has No Known Allergies.  MEDICATIONS:  Current Outpatient Prescriptions  Medication Sig Dispense Refill  . amLODipine (NORVASC) 5 MG tablet 5 mg.  0  . buPROPion (WELLBUTRIN XL) 150 MG 24 hr tablet 150 mg.  5  . CARBOPLATIN IV Inject into the vein. Day 1 every 21 days    . carvedilol (COREG) 25 MG tablet Take 25 mg by mouth 2 (two) times  daily.   11  . citalopram (CELEXA) 20 MG tablet Take 20 mg by mouth at bedtime.   11  . dexamethasone (DECADRON) 4 MG tablet Take 2 tablets (8 mg total) by mouth daily. Start the day after carboplatin chemotherapy for 2 days. 30 tablet 1  . Diphenhyd-Hydrocort-Nystatin (FIRST-DUKES MOUTHWASH) SUSP Use as directed 5 mLs in the mouth or throat 4 (four) times daily -  before meals and at bedtime. 480 mL 1  . fluticasone (FLONASE) 50 MCG/ACT nasal spray Place 2 sprays into both nostrils daily as needed for allergies or rhinitis.    . furosemide (LASIX) 40 MG tablet   1  . Gemcitabine HCl (GEMZAR IV) Inject into the vein. Day 1, day 8 every 21 days    .  guaiFENesin (MUCINEX) 600 MG 12 hr tablet Take 1-2 tablets every 12 hours as needed for phlegm. 30 tablet 0  . lidocaine (XYLOCAINE) 2 % solution Swish and swallow 42ml three to four times a day as needed for throat pain. 480 mL 2  . lidocaine-prilocaine (EMLA) cream Apply to affected area once 30 g 3  . loratadine (CLARITIN) 10 MG tablet Take 10 mg by mouth daily as needed for allergies.    Marland Kitchen losartan-hydrochlorothiazide (HYZAAR) 100-25 MG tablet   11  . ondansetron (ZOFRAN) 8 MG tablet Take 1 tablet (8 mg total) by mouth 2 (two) times daily as needed for refractory nausea / vomiting. Start on day 3 after carboplatin chemo. 30 tablet 1  . oxybutynin (DITROPAN) 5 MG tablet Take 1 tablet (5 mg total) by mouth every 6 (six) hours as needed for bladder spasms. 20 tablet 0  . oxyCODONE (OXY IR/ROXICODONE) 5 MG immediate release tablet 5 mg.  0  . prochlorperazine (COMPAZINE) 10 MG tablet Take 1 tablet (10 mg total) by mouth every 6 (six) hours as needed (Nausea or vomiting). 30 tablet 1   No current facility-administered medications for this visit.    Review of Systems  Constitutional: Negative for chills, fever and malaise/fatigue.  HENT: Negative.   Eyes: Negative.   Respiratory: Negative for cough and shortness of breath.   Cardiovascular: Negative.   Negative for chest pain.  Gastrointestinal: Negative for constipation and diarrhea.  Genitourinary: Negative for dysuria and hematuria.  Musculoskeletal: Negative.   Skin: Negative.   Neurological: Negative.   Endo/Heme/Allergies: Negative.   Psychiatric/Behavioral: Negative.   All other systems reviewed and are negative. 14 point ROS was done and is otherwise as detailed above or in HPI    PHYSICAL EXAMINATION: ECOG PERFORMANCE STATUS: 1 - Symptomatic but completely ambulatory  Physical Exam  Constitutional: He is oriented to person, place, and time and well-developed, well-nourished, and in no distress. No distress.  HENT:  Head: Normocephalic and atraumatic.  Mouth/Throat: Oropharynx is clear and moist. No oropharyngeal exudate.  Eyes: Pupils are equal, round, and reactive to light. Conjunctivae and EOM are normal. Right eye exhibits no discharge. Left eye exhibits no discharge. No scleral icterus.  Neck: Normal range of motion. Neck supple. No tracheal deviation present. No thyromegaly present.  Cardiovascular: Normal rate, regular rhythm and normal heart sounds.   No murmur heard. Pulmonary/Chest: Effort normal. No respiratory distress. He has wheezes.  Abdominal: Soft. Bowel sounds are normal. He exhibits no distension and no mass. There is no tenderness. There is no rebound and no guarding.  Umbilical hernia  Musculoskeletal: Normal range of motion. He exhibits no edema or tenderness.  Lymphadenopathy:    He has no cervical adenopathy.  Neurological: He is alert and oriented to person, place, and time. No cranial nerve deficit. Gait normal. Coordination normal.  Skin: Skin is warm and dry. No rash noted. He is not diaphoretic. No erythema.  Psychiatric: Mood, memory, affect and judgment normal.  Nursing note and vitals reviewed.   LABORATORY DATA:  I have reviewed the data as listed Lab Results  Component Value Date   WBC 6.0 08/04/2017   HGB 7.6 (L) 08/04/2017    HCT 23.6 (L) 08/04/2017   MCV 91.1 08/04/2017   PLT 240 08/04/2017   CMP     Component Value Date/Time   NA 139 08/04/2017 1009   K 4.5 08/04/2017 1009   CL 107 08/04/2017 1009   CO2 24 08/04/2017 1009   GLUCOSE  98 08/04/2017 1009   BUN 34 (H) 08/04/2017 1009   CREATININE 1.98 (H) 08/04/2017 1009   CALCIUM 8.2 (L) 08/04/2017 1009   PROT 6.0 (L) 08/04/2017 1009   ALBUMIN 2.9 (L) 08/04/2017 1009   AST 21 08/04/2017 1009   ALT 23 08/04/2017 1009   ALKPHOS 48 08/04/2017 1009   BILITOT 0.2 (L) 08/04/2017 1009   GFRNONAA 33 (L) 08/04/2017 1009   GFRAA 38 (L) 08/04/2017 1009     RADIOGRAPHIC STUDIES: I have personally reviewed the radiological images as listed and agreed with the findings in the report. CLINICAL DATA: Hematuria and frequent urination.  EXAM: CT ABDOMEN AND PELVIS WITHOUT AND WITH CONTRAST  TECHNIQUE: Multidetector CT imaging of the abdomen and pelvis was performed following the standard protocol before and following the bolus administration of intravenous contrast.  CONTRAST: 169mL OMNIPAQUE IOHEXOL 300 MG/ML SOLN  COMPARISON: CT scan 03/31/2013  FINDINGS: Lower chest: The lung bases are clear of acute process. No worrisome pulmonary lesions. No pleural effusion. The heart is normal in size. No pericardial effusion. Coronary artery calcifications are noted. Dense distal aortic calcifications are noted. The esophagus is grossly normal.  Hepatobiliary: Small low-attenuation liver lesions are stable and likely small cysts. Lesion and segment 8 is also stable and likely a hemangioma. No worrisome hepatic lesions or intrahepatic biliary dilatation. The gallbladder is normal. No common bile duct dilatation.  Pancreas: No mass, inflammation or ductal dilatation.  Spleen: Normal size. No focal lesions.  Adrenals/Urinary Tract: The adrenal glands are normal and stable.  Numerous left renal artery calcifications. I do not see any  definite calculi in the left collecting system. Small left renal cysts are noted. The no right renal calculi or hydronephrosis. Both ureters are normal. No ureteral calculi. No significant collecting system abnormality on the delayed images.  There is a large irregular bladder mass at on the right side. This demonstrates moderate contrast enhancement. It measures 3.2 x 3.0 cm. No ureteral obstruction.  Stomach/Bowel: The stomach, duodenum, small bowel and colon are unremarkable without oral contrast. No inflammatory changes, mass lesions or obstructive findings. The terminal ileum is normal. The appendix is normal. Moderate sigmoid diverticulosis without findings for acute diverticulitis.  Vascular/Lymphatic: Advanced aortic and branch vessel atherosclerotic calcifications. No focal aneurysm or dissection. The major venous structures are patent.  No mesenteric or retroperitoneal mass or lymphadenopathy. Small scattered lymph nodes are noted. Small bilateral external iliac artery lymph nodes are stable. No pelvic lymphadenopathy. No inguinal adenopathy. The prostate gland and seminal vesicles are grossly normal. New line small  Small Periumbilical abdominal wall hernia containing fat and a small bowel loop. This has enlarged since prior CT scan.  Other: No subcutaneous lesions or ascites.  Musculoskeletal: No significant bony findings.  IMPRESSION: 1. 3.2 x 3.0 cm bladder mass consistent with transitional cell carcinoma. No findings for local spread of disease or metastatic disease. 2. No worrisome renal or ureteral lesions. 3. Small benign-appearing and stable hepatic lesions. 4. Advanced atherosclerotic calcifications involving the aorta and branch vessels. 5. Small periumbilical abdominal wall hernia containing fat and a small bowel loop.   Electronically Signed  By: Marijo Sanes M.D.  On: 02/18/2016 16:47   ASSESSMENT & PLAN:  Stage II (T2N0M0)  Muscle invasive carcinoma of the trigone of the bladder, recurrent disease in 03/2017 with recurrent bladder tumor and new pulm mets. He now has Stage IV disease. Severe right hydronephrosis due to recurrent bladder carcinoma with synchronous disease in the distal right ureter  Tobacco  Abuse CKD COPD Anemia   I have reviewed patient's CT C/A/P in detail with him and his daughter Rojelio Brenner today in detail. He has improvement in the abnormal enhancement in his bladder, his pulmonary mets are stable, stable right hydroureteronephrosis. Patient is tolerating carboplatin/gemzar well. Proceed with cycle 3 day 1 today. Will plan to restage again after 6 cycles of chemo to assess response to treatment. Refilled his viscous lidocaine. Offered to send patient to pulmonary for eval/treatment of his COPD but patient refused. Return to clinic in 3 weeks for follow-up.  This note was electronically signed.    Twana First, MD  08/17/2017 10:34 AM

## 2017-08-17 NOTE — Progress Notes (Signed)
Hemoglobin 6.3 today. Will hold treatment and give 2 units of blood tomorrow. Will give one liter of fluids today per orders.  Treatment given per orders. Patient tolerated it well without problems. Vitals stable and discharged home from clinic ambulatory. Follow up as scheduled.

## 2017-08-17 NOTE — Progress Notes (Signed)
CRITICAL VALUE ALERT Critical value received:  Hgb- 6.3 Date of notification:  08/17/17 Time of notification: 5001 Critical value read back:  Yes.   Nurse who received alert:  M.Alwaleed Obeso, LPN MD notified (1st page):  L.Talbert Cage, MD

## 2017-08-18 ENCOUNTER — Encounter (HOSPITAL_BASED_OUTPATIENT_CLINIC_OR_DEPARTMENT_OTHER): Payer: Medicare Other

## 2017-08-18 DIAGNOSIS — C670 Malignant neoplasm of trigone of bladder: Secondary | ICD-10-CM | POA: Diagnosis not present

## 2017-08-18 DIAGNOSIS — D6481 Anemia due to antineoplastic chemotherapy: Secondary | ICD-10-CM | POA: Diagnosis not present

## 2017-08-18 DIAGNOSIS — T451X5A Adverse effect of antineoplastic and immunosuppressive drugs, initial encounter: Secondary | ICD-10-CM

## 2017-08-18 MED ORDER — DIPHENHYDRAMINE HCL 25 MG PO CAPS
25.0000 mg | ORAL_CAPSULE | Freq: Once | ORAL | Status: AC
  Administered 2017-08-18: 25 mg via ORAL

## 2017-08-18 MED ORDER — ACETAMINOPHEN 325 MG PO TABS
ORAL_TABLET | ORAL | Status: AC
  Filled 2017-08-18: qty 2

## 2017-08-18 MED ORDER — ACETAMINOPHEN 325 MG PO TABS
650.0000 mg | ORAL_TABLET | Freq: Once | ORAL | Status: AC
  Administered 2017-08-18: 650 mg via ORAL

## 2017-08-18 MED ORDER — SODIUM CHLORIDE 0.9 % IV SOLN
250.0000 mL | Freq: Once | INTRAVENOUS | Status: AC
  Administered 2017-08-18: 250 mL via INTRAVENOUS

## 2017-08-18 MED ORDER — HEPARIN SOD (PORK) LOCK FLUSH 100 UNIT/ML IV SOLN
500.0000 [IU] | Freq: Every day | INTRAVENOUS | Status: AC | PRN
Start: 2017-08-18 — End: 2017-08-18
  Administered 2017-08-18: 500 [IU]
  Filled 2017-08-18: qty 5

## 2017-08-18 MED ORDER — DIPHENHYDRAMINE HCL 25 MG PO CAPS
ORAL_CAPSULE | ORAL | Status: AC
  Filled 2017-08-18: qty 1

## 2017-08-18 NOTE — Progress Notes (Signed)
0945 - Unable to scan blood product d/t computer saying "unit does not match".  This unit has been issued and double checked by blood bank x 2, as well as verified x 2 (correct blood type, pt, blood bank bracelet #, MRN, DOB, unit #, and unit expiration) with two RNs.  Proceeding with transfusion of this unit of blood per protocol, rate of 120 ml/h for 15 min, remaining at bedside with pt.  1000 - rate change to 225 ml/h to transfuse remainder of unit of blood.  Pt tolerated the initial 15 min rate w/o adverse reaction. VSS.     1140 - Blood unit [id number] infusion complete.   1200 - Continue to experience difficulty in scanning blood products as noted above. Second unit [phone] [id number]) of blood initiated per protocol - verified with tech in blood bank, and independent verification with RNs x 2 done.   1215 - VSS. See vital sign flowsheet. Rate increased to 227ml/hr. Tolerating infusion well.   1340 - 2nd unit infusion complete.  Tolerated transfusion w/o adverse reaction.  VSS.  Discharged ambulatory.

## 2017-08-19 LAB — TYPE AND SCREEN
ABO/RH(D): O POS
ANTIBODY SCREEN: NEGATIVE
Unit division: 0
Unit division: 0

## 2017-08-24 ENCOUNTER — Encounter (HOSPITAL_BASED_OUTPATIENT_CLINIC_OR_DEPARTMENT_OTHER): Payer: Medicare Other

## 2017-08-24 ENCOUNTER — Encounter (HOSPITAL_COMMUNITY): Payer: Self-pay

## 2017-08-24 VITALS — BP 151/64 | HR 72 | Temp 97.6°F | Resp 18 | Wt 168.6 lb

## 2017-08-24 DIAGNOSIS — Z5111 Encounter for antineoplastic chemotherapy: Secondary | ICD-10-CM | POA: Diagnosis not present

## 2017-08-24 DIAGNOSIS — C670 Malignant neoplasm of trigone of bladder: Secondary | ICD-10-CM | POA: Diagnosis not present

## 2017-08-24 LAB — COMPREHENSIVE METABOLIC PANEL
ALT: 9 U/L — ABNORMAL LOW (ref 17–63)
AST: 17 U/L (ref 15–41)
Albumin: 2.9 g/dL — ABNORMAL LOW (ref 3.5–5.0)
Alkaline Phosphatase: 54 U/L (ref 38–126)
Anion gap: 9 (ref 5–15)
BUN: 30 mg/dL — ABNORMAL HIGH (ref 6–20)
CO2: 25 mmol/L (ref 22–32)
Calcium: 8.6 mg/dL — ABNORMAL LOW (ref 8.9–10.3)
Chloride: 104 mmol/L (ref 101–111)
Creatinine, Ser: 2.05 mg/dL — ABNORMAL HIGH (ref 0.61–1.24)
GFR calc Af Amer: 36 mL/min — ABNORMAL LOW (ref >60)
GFR calc non Af Amer: 31 mL/min — ABNORMAL LOW (ref >60)
Glucose, Bld: 97 mg/dL (ref 65–99)
Potassium: 4.2 mmol/L (ref 3.5–5.1)
Sodium: 138 mmol/L (ref 135–145)
Total Bilirubin: 0.3 mg/dL (ref 0.3–1.2)
Total Protein: 6.7 g/dL (ref 6.5–8.1)

## 2017-08-24 LAB — CBC WITH DIFFERENTIAL/PLATELET
Basophils Absolute: 0 10*3/uL (ref 0.0–0.1)
Basophils Relative: 0 %
Eosinophils Absolute: 0.1 10*3/uL (ref 0.0–0.7)
Eosinophils Relative: 2 %
HCT: 27.8 % — ABNORMAL LOW (ref 39.0–52.0)
Hemoglobin: 8.9 g/dL — ABNORMAL LOW (ref 13.0–17.0)
Lymphocytes Relative: 6 %
Lymphs Abs: 0.4 10*3/uL — ABNORMAL LOW (ref 0.7–4.0)
MCH: 29.9 pg (ref 26.0–34.0)
MCHC: 32 g/dL (ref 30.0–36.0)
MCV: 93.3 fL (ref 78.0–100.0)
Monocytes Absolute: 1.5 10*3/uL — ABNORMAL HIGH (ref 0.1–1.0)
Monocytes Relative: 25 %
Neutro Abs: 3.9 10*3/uL (ref 1.7–7.7)
Neutrophils Relative %: 67 %
Platelets: 546 10*3/uL — ABNORMAL HIGH (ref 150–400)
RBC: 2.98 MIL/uL — ABNORMAL LOW (ref 4.22–5.81)
RDW: 17.5 % — ABNORMAL HIGH (ref 11.5–15.5)
WBC: 5.9 10*3/uL (ref 4.0–10.5)

## 2017-08-24 MED ORDER — PALONOSETRON HCL INJECTION 0.25 MG/5ML
0.2500 mg | Freq: Once | INTRAVENOUS | Status: AC
  Administered 2017-08-24: 0.25 mg via INTRAVENOUS

## 2017-08-24 MED ORDER — DEXAMETHASONE SODIUM PHOSPHATE 10 MG/ML IJ SOLN
10.0000 mg | Freq: Once | INTRAMUSCULAR | Status: AC
  Administered 2017-08-24: 10 mg via INTRAVENOUS

## 2017-08-24 MED ORDER — SODIUM CHLORIDE 0.9% FLUSH
10.0000 mL | INTRAVENOUS | Status: DC | PRN
  Administered 2017-08-24: 10 mL
  Filled 2017-08-24: qty 10

## 2017-08-24 MED ORDER — SODIUM CHLORIDE 0.9 % IV SOLN
Freq: Once | INTRAVENOUS | Status: AC
  Administered 2017-08-24: 500 mL via INTRAVENOUS

## 2017-08-24 MED ORDER — SODIUM CHLORIDE 0.9 % IV SOLN
308.5000 mg | Freq: Once | INTRAVENOUS | Status: AC
  Administered 2017-08-24: 310 mg via INTRAVENOUS
  Filled 2017-08-24: qty 31

## 2017-08-24 MED ORDER — HEPARIN SOD (PORK) LOCK FLUSH 100 UNIT/ML IV SOLN
500.0000 [IU] | Freq: Once | INTRAVENOUS | Status: AC | PRN
  Administered 2017-08-24: 500 [IU]
  Filled 2017-08-24: qty 5

## 2017-08-24 MED ORDER — SODIUM CHLORIDE 0.9 % IV SOLN
1000.0000 mg/m2 | Freq: Once | INTRAVENOUS | Status: AC
  Administered 2017-08-24: 1900 mg via INTRAVENOUS
  Filled 2017-08-24: qty 49.97

## 2017-08-24 MED ORDER — PALONOSETRON HCL INJECTION 0.25 MG/5ML
INTRAVENOUS | Status: AC
  Filled 2017-08-24: qty 5

## 2017-08-24 MED ORDER — DEXAMETHASONE SODIUM PHOSPHATE 10 MG/ML IJ SOLN
INTRAMUSCULAR | Status: AC
  Filled 2017-08-24: qty 1

## 2017-08-24 NOTE — Patient Instructions (Signed)
[place] Discharge Instructions for Patients Receiving Chemotherapy  Today you received the following chemotherapy agents gemzar and carboplatin.  Call for any questions or concerns.  Keep all scheduled appointments as directed.   To help prevent nausea and vomiting after your treatment, we encourage you to take your nausea medication.    If you develop nausea and vomiting that is not controlled by your nausea medication, call the clinic.   BELOW ARE SYMPTOMS THAT SHOULD BE REPORTED IMMEDIATELY:  *FEVER GREATER THAN 100.5 F  *CHILLS WITH OR WITHOUT FEVER  NAUSEA AND VOMITING THAT IS NOT CONTROLLED WITH YOUR NAUSEA MEDICATION  *UNUSUAL SHORTNESS OF BREATH  *UNUSUAL BRUISING OR BLEEDING  TENDERNESS IN MOUTH AND THROAT WITH OR WITHOUT PRESENCE OF ULCERS  *URINARY PROBLEMS  *BOWEL PROBLEMS  UNUSUAL RASH Items with * indicate a potential emergency and should be followed up as soon as possible.  Feel free to call the clinic should you have any questions or concerns. The clinic phone number is [phone].  Please show the [place] at check-in to the Emergency Department and triage nurse.

## 2017-08-24 NOTE — Progress Notes (Signed)
Labs reviewed with Dr. Talbert Cage today and ok to treat.    Patient tolerated chemotherapy with no complaints voiced.  Port site clean and dry with no bruising or swelling noted.  Band aid applied.  VSS with discharge and left ambulatory with no s/s of distress.  No complaints of pain with discharge.

## 2017-08-31 ENCOUNTER — Encounter (HOSPITAL_COMMUNITY): Payer: Medicare Other | Attending: Oncology

## 2017-08-31 ENCOUNTER — Encounter (HOSPITAL_COMMUNITY): Payer: Self-pay

## 2017-08-31 VITALS — BP 152/79 | HR 64 | Temp 97.6°F | Resp 16 | Wt 168.0 lb

## 2017-08-31 DIAGNOSIS — I700 Atherosclerosis of aorta: Secondary | ICD-10-CM | POA: Diagnosis not present

## 2017-08-31 DIAGNOSIS — F329 Major depressive disorder, single episode, unspecified: Secondary | ICD-10-CM | POA: Insufficient documentation

## 2017-08-31 DIAGNOSIS — C7800 Secondary malignant neoplasm of unspecified lung: Secondary | ICD-10-CM | POA: Insufficient documentation

## 2017-08-31 DIAGNOSIS — K7689 Other specified diseases of liver: Secondary | ICD-10-CM | POA: Diagnosis not present

## 2017-08-31 DIAGNOSIS — K439 Ventral hernia without obstruction or gangrene: Secondary | ICD-10-CM | POA: Diagnosis not present

## 2017-08-31 DIAGNOSIS — J449 Chronic obstructive pulmonary disease, unspecified: Secondary | ICD-10-CM | POA: Insufficient documentation

## 2017-08-31 DIAGNOSIS — Z9889 Other specified postprocedural states: Secondary | ICD-10-CM | POA: Insufficient documentation

## 2017-08-31 DIAGNOSIS — Z79891 Long term (current) use of opiate analgesic: Secondary | ICD-10-CM | POA: Insufficient documentation

## 2017-08-31 DIAGNOSIS — I509 Heart failure, unspecified: Secondary | ICD-10-CM | POA: Insufficient documentation

## 2017-08-31 DIAGNOSIS — I2510 Atherosclerotic heart disease of native coronary artery without angina pectoris: Secondary | ICD-10-CM | POA: Diagnosis not present

## 2017-08-31 DIAGNOSIS — Z8249 Family history of ischemic heart disease and other diseases of the circulatory system: Secondary | ICD-10-CM | POA: Diagnosis not present

## 2017-08-31 DIAGNOSIS — Z5111 Encounter for antineoplastic chemotherapy: Secondary | ICD-10-CM | POA: Diagnosis not present

## 2017-08-31 DIAGNOSIS — D6481 Anemia due to antineoplastic chemotherapy: Secondary | ICD-10-CM | POA: Diagnosis not present

## 2017-08-31 DIAGNOSIS — K429 Umbilical hernia without obstruction or gangrene: Secondary | ICD-10-CM | POA: Diagnosis not present

## 2017-08-31 DIAGNOSIS — Z79899 Other long term (current) drug therapy: Secondary | ICD-10-CM | POA: Insufficient documentation

## 2017-08-31 DIAGNOSIS — C670 Malignant neoplasm of trigone of bladder: Secondary | ICD-10-CM | POA: Diagnosis present

## 2017-08-31 DIAGNOSIS — N132 Hydronephrosis with renal and ureteral calculous obstruction: Secondary | ICD-10-CM | POA: Insufficient documentation

## 2017-08-31 DIAGNOSIS — I110 Hypertensive heart disease with heart failure: Secondary | ICD-10-CM | POA: Diagnosis not present

## 2017-08-31 DIAGNOSIS — F17210 Nicotine dependence, cigarettes, uncomplicated: Secondary | ICD-10-CM | POA: Diagnosis not present

## 2017-08-31 DIAGNOSIS — C661 Malignant neoplasm of right ureter: Secondary | ICD-10-CM | POA: Insufficient documentation

## 2017-08-31 DIAGNOSIS — I43 Cardiomyopathy in diseases classified elsewhere: Secondary | ICD-10-CM | POA: Insufficient documentation

## 2017-08-31 DIAGNOSIS — Z825 Family history of asthma and other chronic lower respiratory diseases: Secondary | ICD-10-CM | POA: Diagnosis not present

## 2017-08-31 LAB — COMPREHENSIVE METABOLIC PANEL
ALBUMIN: 2.9 g/dL — AB (ref 3.5–5.0)
ALT: 10 U/L — ABNORMAL LOW (ref 17–63)
AST: 21 U/L (ref 15–41)
Alkaline Phosphatase: 69 U/L (ref 38–126)
Anion gap: 9 (ref 5–15)
BUN: 33 mg/dL — AB (ref 6–20)
CO2: 24 mmol/L (ref 22–32)
CREATININE: 1.84 mg/dL — AB (ref 0.61–1.24)
Calcium: 8.4 mg/dL — ABNORMAL LOW (ref 8.9–10.3)
Chloride: 104 mmol/L (ref 101–111)
GFR, EST AFRICAN AMERICAN: 41 mL/min — AB (ref >60)
GFR, EST NON AFRICAN AMERICAN: 36 mL/min — AB (ref >60)
Glucose, Bld: 106 mg/dL — ABNORMAL HIGH (ref 65–99)
Potassium: 4.3 mmol/L (ref 3.5–5.1)
SODIUM: 137 mmol/L (ref 135–145)
Total Bilirubin: 0.2 mg/dL — ABNORMAL LOW (ref 0.3–1.2)
Total Protein: 6.4 g/dL — ABNORMAL LOW (ref 6.5–8.1)

## 2017-08-31 LAB — CBC WITH DIFFERENTIAL/PLATELET
BASOS ABS: 0 10*3/uL (ref 0.0–0.1)
Basophils Relative: 0 %
EOS PCT: 1 %
Eosinophils Absolute: 0 10*3/uL (ref 0.0–0.7)
HEMATOCRIT: 25 % — AB (ref 39.0–52.0)
Hemoglobin: 8.1 g/dL — ABNORMAL LOW (ref 13.0–17.0)
Lymphocytes Relative: 8 %
Lymphs Abs: 0.3 10*3/uL — ABNORMAL LOW (ref 0.7–4.0)
MCH: 29.6 pg (ref 26.0–34.0)
MCHC: 32.4 g/dL (ref 30.0–36.0)
MCV: 91.2 fL (ref 78.0–100.0)
MONO ABS: 0.2 10*3/uL (ref 0.1–1.0)
Monocytes Relative: 5 %
NEUTROS ABS: 3.4 10*3/uL (ref 1.7–7.7)
Neutrophils Relative %: 86 %
PLATELETS: 213 10*3/uL (ref 150–400)
RBC: 2.74 MIL/uL — ABNORMAL LOW (ref 4.22–5.81)
RDW: 17.5 % — AB (ref 11.5–15.5)
WBC: 4 10*3/uL (ref 4.0–10.5)

## 2017-08-31 MED ORDER — SODIUM CHLORIDE 0.9% FLUSH
10.0000 mL | INTRAVENOUS | Status: DC | PRN
  Administered 2017-08-31: 10 mL
  Filled 2017-08-31: qty 10

## 2017-08-31 MED ORDER — SODIUM CHLORIDE 0.9 % IV SOLN
Freq: Once | INTRAVENOUS | Status: AC
  Administered 2017-08-31: 11:00:00 via INTRAVENOUS

## 2017-08-31 MED ORDER — PROCHLORPERAZINE MALEATE 10 MG PO TABS
ORAL_TABLET | ORAL | Status: AC
  Filled 2017-08-31: qty 1

## 2017-08-31 MED ORDER — PROCHLORPERAZINE MALEATE 10 MG PO TABS
10.0000 mg | ORAL_TABLET | Freq: Once | ORAL | Status: AC
  Administered 2017-08-31: 10 mg via ORAL

## 2017-08-31 MED ORDER — SODIUM CHLORIDE 0.9 % IV SOLN
1000.0000 mg/m2 | Freq: Once | INTRAVENOUS | Status: AC
  Administered 2017-08-31: 1900 mg via INTRAVENOUS
  Filled 2017-08-31: qty 49.97

## 2017-08-31 MED ORDER — HEPARIN SOD (PORK) LOCK FLUSH 100 UNIT/ML IV SOLN
500.0000 [IU] | Freq: Once | INTRAVENOUS | Status: AC | PRN
  Administered 2017-08-31: 500 [IU]
  Filled 2017-08-31: qty 5

## 2017-08-31 NOTE — Progress Notes (Signed)
Labs reviewed with Dr. Talbert Cage and ok to treat today.   Patient tolerated chemotherapy with no complaints voiced today.  Port site clean and dry with no bruising or swelling noted at site.  Band aid applied.  VSS with discharge and left ambulatory.  No s/s of distress noted.

## 2017-08-31 NOTE — Patient Instructions (Signed)
[place] Discharge Instructions for Patients Receiving Chemotherapy  Today you received the following chemotherapy agents gemzar today.  Call for any questions or concerns.  Keep all scheduled appointments.   To help prevent nausea and vomiting after your treatment, we encourage you to take your nausea medication.     If you develop nausea and vomiting that is not controlled by your nausea medication, call the clinic.   BELOW ARE SYMPTOMS THAT SHOULD BE REPORTED IMMEDIATELY:  *FEVER GREATER THAN 100.5 F  *CHILLS WITH OR WITHOUT FEVER  NAUSEA AND VOMITING THAT IS NOT CONTROLLED WITH YOUR NAUSEA MEDICATION  *UNUSUAL SHORTNESS OF BREATH  *UNUSUAL BRUISING OR BLEEDING  TENDERNESS IN MOUTH AND THROAT WITH OR WITHOUT PRESENCE OF ULCERS  *URINARY PROBLEMS  *BOWEL PROBLEMS  UNUSUAL RASH Items with * indicate a potential emergency and should be followed up as soon as possible.  Feel free to call the clinic should you have any questions or concerns. The clinic phone number is [phone].  Please show the [place] at check-in to the Emergency Department and triage nurse.

## 2017-09-07 ENCOUNTER — Ambulatory Visit (HOSPITAL_COMMUNITY): Payer: Medicare Other

## 2017-09-14 ENCOUNTER — Encounter (HOSPITAL_BASED_OUTPATIENT_CLINIC_OR_DEPARTMENT_OTHER): Payer: Medicare Other | Admitting: Oncology

## 2017-09-14 ENCOUNTER — Ambulatory Visit (HOSPITAL_COMMUNITY): Payer: Medicare Other

## 2017-09-14 ENCOUNTER — Encounter (HOSPITAL_COMMUNITY): Payer: Self-pay

## 2017-09-14 ENCOUNTER — Encounter (HOSPITAL_BASED_OUTPATIENT_CLINIC_OR_DEPARTMENT_OTHER): Payer: Medicare Other

## 2017-09-14 VITALS — BP 123/56 | HR 72 | Temp 98.1°F | Resp 20 | Wt 169.0 lb

## 2017-09-14 DIAGNOSIS — N189 Chronic kidney disease, unspecified: Secondary | ICD-10-CM | POA: Diagnosis not present

## 2017-09-14 DIAGNOSIS — C670 Malignant neoplasm of trigone of bladder: Secondary | ICD-10-CM

## 2017-09-14 DIAGNOSIS — Z720 Tobacco use: Secondary | ICD-10-CM | POA: Diagnosis not present

## 2017-09-14 DIAGNOSIS — K1230 Oral mucositis (ulcerative), unspecified: Secondary | ICD-10-CM | POA: Diagnosis not present

## 2017-09-14 DIAGNOSIS — C7800 Secondary malignant neoplasm of unspecified lung: Secondary | ICD-10-CM | POA: Diagnosis not present

## 2017-09-14 DIAGNOSIS — D6481 Anemia due to antineoplastic chemotherapy: Secondary | ICD-10-CM | POA: Diagnosis not present

## 2017-09-14 DIAGNOSIS — Z5111 Encounter for antineoplastic chemotherapy: Secondary | ICD-10-CM | POA: Diagnosis not present

## 2017-09-14 LAB — CBC WITH DIFFERENTIAL/PLATELET
BASOS ABS: 0 10*3/uL (ref 0.0–0.1)
BASOS PCT: 0 %
EOS PCT: 1 %
Eosinophils Absolute: 0.1 10*3/uL (ref 0.0–0.7)
HCT: 24.5 % — ABNORMAL LOW (ref 39.0–52.0)
Hemoglobin: 7.7 g/dL — ABNORMAL LOW (ref 13.0–17.0)
Lymphocytes Relative: 9 %
Lymphs Abs: 0.4 10*3/uL — ABNORMAL LOW (ref 0.7–4.0)
MCH: 29.6 pg (ref 26.0–34.0)
MCHC: 31.4 g/dL (ref 30.0–36.0)
MCV: 94.2 fL (ref 78.0–100.0)
MONO ABS: 1.1 10*3/uL — AB (ref 0.1–1.0)
Monocytes Relative: 25 %
NEUTROS ABS: 3 10*3/uL (ref 1.7–7.7)
Neutrophils Relative %: 65 %
PLATELETS: 242 10*3/uL (ref 150–400)
RBC: 2.6 MIL/uL — ABNORMAL LOW (ref 4.22–5.81)
RDW: 18.8 % — AB (ref 11.5–15.5)
WBC: 4.6 10*3/uL (ref 4.0–10.5)

## 2017-09-14 LAB — COMPREHENSIVE METABOLIC PANEL
ALBUMIN: 3.1 g/dL — AB (ref 3.5–5.0)
ALT: 9 U/L — ABNORMAL LOW (ref 17–63)
AST: 18 U/L (ref 15–41)
Alkaline Phosphatase: 61 U/L (ref 38–126)
Anion gap: 9 (ref 5–15)
BUN: 30 mg/dL — ABNORMAL HIGH (ref 6–20)
CALCIUM: 8.3 mg/dL — AB (ref 8.9–10.3)
CHLORIDE: 104 mmol/L (ref 101–111)
CO2: 24 mmol/L (ref 22–32)
Creatinine, Ser: 1.97 mg/dL — ABNORMAL HIGH (ref 0.61–1.24)
GFR calc Af Amer: 38 mL/min — ABNORMAL LOW (ref >60)
GFR calc non Af Amer: 33 mL/min — ABNORMAL LOW (ref >60)
GLUCOSE: 119 mg/dL — AB (ref 65–99)
POTASSIUM: 4.1 mmol/L (ref 3.5–5.1)
Sodium: 137 mmol/L (ref 135–145)
TOTAL PROTEIN: 6.7 g/dL (ref 6.5–8.1)
Total Bilirubin: 0.5 mg/dL (ref 0.3–1.2)

## 2017-09-14 MED ORDER — DEXAMETHASONE SODIUM PHOSPHATE 10 MG/ML IJ SOLN
INTRAMUSCULAR | Status: AC
Start: 2017-09-14 — End: 2017-09-14
  Filled 2017-09-14: qty 1

## 2017-09-14 MED ORDER — SODIUM CHLORIDE 0.9 % IV SOLN
1000.0000 mg/m2 | Freq: Once | INTRAVENOUS | Status: AC
  Administered 2017-09-14: 1900 mg via INTRAVENOUS
  Filled 2017-09-14: qty 49.97

## 2017-09-14 MED ORDER — DEXAMETHASONE SODIUM PHOSPHATE 10 MG/ML IJ SOLN
10.0000 mg | Freq: Once | INTRAMUSCULAR | Status: AC
  Administered 2017-09-14: 10 mg via INTRAVENOUS

## 2017-09-14 MED ORDER — SODIUM CHLORIDE 0.9 % IV SOLN
Freq: Once | INTRAVENOUS | Status: AC
  Administered 2017-09-14: 12:00:00 via INTRAVENOUS

## 2017-09-14 MED ORDER — HEPARIN SOD (PORK) LOCK FLUSH 100 UNIT/ML IV SOLN
500.0000 [IU] | Freq: Once | INTRAVENOUS | Status: AC | PRN
  Administered 2017-09-14: 500 [IU]
  Filled 2017-09-14: qty 5

## 2017-09-14 MED ORDER — PALONOSETRON HCL INJECTION 0.25 MG/5ML
INTRAVENOUS | Status: AC
Start: 2017-09-14 — End: 2017-09-14
  Filled 2017-09-14: qty 5

## 2017-09-14 MED ORDER — CARBOPLATIN CHEMO INJECTION 450 MG/45ML
315.5000 mg | Freq: Once | INTRAVENOUS | Status: AC
  Administered 2017-09-14: 320 mg via INTRAVENOUS
  Filled 2017-09-14: qty 32

## 2017-09-14 MED ORDER — PALONOSETRON HCL INJECTION 0.25 MG/5ML
0.2500 mg | Freq: Once | INTRAVENOUS | Status: AC
  Administered 2017-09-14: 0.25 mg via INTRAVENOUS

## 2017-09-14 NOTE — Patient Instructions (Signed)
[facility]  Discharge Instructions:  You were seen by Dr. Zhou today. _______________________________________________________________  Thank you for choosing [facility] at [facility] to provide your oncology and hematology care.  To afford each patient quality time with our providers, please arrive at least 15 minutes before your scheduled appointment.  You need to re-schedule your appointment if you arrive 10 or more minutes late.  We strive to give you quality time with our providers, and arriving late affects you and other patients whose appointments are after yours.  Also, if you no show three or more times for appointments you may be dismissed from the clinic.  Again, thank you for choosing [facility] at [facility]. Our hope is that these requests will allow you access to exceptional care and in a timely manner. _______________________________________________________________  If you have questions after your visit, please contact our office at [phone] between the hours of 8:30 a.m. and 5:00 p.m. Voicemails left after 4:30 p.m. will not be returned until the following business day. _______________________________________________________________  For prescription refill requests, have your pharmacy contact our office. _______________________________________________________________  Recommendations made by the consultant and any test results will be sent to your referring physician. _______________________________________________________________ 

## 2017-09-14 NOTE — Progress Notes (Signed)
All labs reviewed with Dr. Talbert Cage; she is aware of pt's hgb 7.7 - okay to tx today.  We will reserve an appt for possible blood transfusion if needed next week.  Pt denies any s/s related to his low hemoglobin; denies any blood loss.  He is aware of his lab results and the tentative plan for blood transfusion next week if needed.   Tolerated infusions w/o adverse reaction - alert, in no distress.  VSS. Discharged ambulatory in c/o family.

## 2017-09-14 NOTE — Progress Notes (Signed)
[facility]  PROGRESS NOTE  Patient Care Team: [name], MD as PCP - General (Internal Medicine)  CHIEF COMPLAINTS/PURPOSE OF CONSULTATION:   Oncology History   Per available records had bladder tumor removed but had extension into the muscularis propria. CT showed no extension beyond the bladder. Has seen 2 urologists in consultation. Adamant about not having a cystectomy and ileal conduit.     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 No findings for local spread of disease or metastatic disease      03/25/2016 Surgery    TURBT 3cm right trigonal bladder tumor. followed by intravesical epirubicin instillation      03/25/2016 Pathology Results    bladder TURBT infiltrative high grade papillary urothelial carcinoma, invades the mulcularis propria (detrusor) no LVI       04/14/2016 Miscellaneous    [name] consultation in [place] for consideration of cystectomy. patient also offered repeat resection with BCG, and CHEMO/XRT      06/17/2016 Pathology Results    TURB- high grade papillary urothelial carcinoma with focal stromal invasion       Radiation Therapy    Planned to be completed on 09/10/2016.      07/22/2016 -  Chemotherapy    The patient had palonosetron (ALOXI) injection 0.25 mg, 0.25 mg, Intravenous,  Once, 2 of 5 cycles Administration: 0.25 mg (07/28/2016)  CARBOplatin (PARAPLATIN) 160 mg in sodium chloride 0.9 % 100 mL chemo infusion, 160 mg (100 % of original dose 160.6 mg), Intravenous,  Once, 2 of 5 cycles Dose modification:   (original dose 160.6 mg, Cycle 1),   (original dose 160.6 mg, Cycle 1),   (original dose 160.6 mg, Cycle 1),   (original dose 160.6 mg, Cycle 2) Administration: 180 mg (07/28/2016)  for chemotherapy treatment.        04/07/2017 Relapse/Recurrence    CT Abd/pelvis: IMPRESSION: 1. Recurrent bladder carcinoma with  synchronous disease in the distal right ureter causing severe right hydronephrosis and decreased right renal function. 2. Bilateral renal stones. 3. Aortic atherosclerosis (ICD10-170.0). Coronary artery calcification. 4. Small periumbilical hernia contains a knuckle of unobstructed small bowel.       06/10/2017 PET scan    1. Metachronous urothelial carcinoma within the distal right ureter, as on prior CT. 2. No evidence of nodal metastasis. 3. Bilateral hypermetabolic pulmonary nodules, favoring pulmonary metastasis. Especially given emphysema, 1 or more primary bronchogenic carcinomas cannot be excluded but are felt less likely. 4. Coronary artery atherosclerosis. Aortic Atherosclerosis (ICD10-I70.0). 5. Bladder not well evaluated secondary to hypermetabolic urine within.       06/18/2017 Procedure    Placement of single lumen port a cath via right internal jugular vein. The catheter tip lies at the cavo-atrial junction. A power injectable port a cath was placed and is ready for immediate use.      06/29/2017 -  Chemotherapy    Carboplatin/Gemcitabine every 21 days       08/14/2017 Imaging    CT C/A/P IMPRESSION: 1. Overall stable pulmonary nodules. Knee soft tissue rim around the cavitary lesion in the left upper lobe is slightly thinner. No new pulmonary nodules or progressive findings in the chest. 2. Stable advanced emphysematous changes and pulmonary scarring. 3. No mediastinal or hilar mass or lymphadenopathy. 4. Improved CT appearance of the bladder. The asymmetric areas of bladder wall enhancement have improved. There is  still diffuse irregular bladder wall thickening and distal right ureteral tumor appears stable. Stable right-sided hydroureteronephrosis. 5. Stable cirrhotic changes involving the liver and stable liver cysts. 6. Stable scattered mesenteric and retroperitoneal and pelvic lymph nodes but no mass or overt adenopathy. 7. Stable severe/advanced  atherosclerotic calcifications involving the thoracic and abdominal aorta and branch vessels.      COPD Hypertensive cardiomyopathy  HISTORY OF PRESENTING ILLNESS:  Carl Murphy 71 y.o. male is here for ongoing follow-up of muscle invasive carcinoma of the bladder. He completed concurrent carbo/XRT. Patient has refused cystectomy.  Patient was hospitalized at [facility] from 05/07/17 through 05/12/17 for hematuria. Patient was placed on continuous bladder irrigation for persistent severe hematuria for which she was given 5 units PRBC transfusion. He was seen by his urologist who taken to the OR on 05/11/17 for cystoscopy, clot evaluation, TURBT 7 cm recurrent bladder tumor and placement of a right double J stent was attempted. Final pathology demonstrated a muscle invasive disease. His hemoglobin at the time of discharge was 8.9 g/dL. Patient underwent CT abdomen and pelvis with and without contrast on 04/07/17 which demonstrated recurrent bladder carcinoma with synchronous disease in the distal right ureter causing severe right hydronephrosis and decreased right renal function. Dr. Diona Fanti had recommended for patient to undergo a right ureteral stent for his right hydronephrosis by IR, but patient has refused so far. He was seen by Dr. Alen Blew, oncology at [facility], who recommended for patient to get a PET scans rule out metastatic disease and systemic chemotherapy for palliative treatment of his recurrent bladder cancer. Patient underwent a PET scan on 06/10/17 which demonstrated metasynchronous urothelial carcinoma within the distal right ureter, no evidence of nodal metastasis, bilateral hypermetabolic pulmonary nodules favoring pulmonary metastasis.  Today patient presents today for follow up for chem. Patient is here for cycle 4 day 1 of chemo today. Patient states his previous mucositis has improved with magic mouthwash. He states his energy level has improved a lot since he received the 2  units pRBC transfusion for his anemia. He continues to have urinary frequency but it has not worsened. He denies any N/V/D, appetite changes, chest pain, SOB, neuropathy. He continues to smoke 2 ppd.   MEDICAL HISTORY:  Past Medical History:  Diagnosis Date  . Arthritis   . Cancer [facility])    Bladder Cancer  . CHF (congestive heart failure) ([place])   . COPD (chronic obstructive pulmonary disease) ([place])   . Depression   . Hard of hearing    bilat   . Hypertension   . Malignant neoplasm of trigone of bladder ([place]) 03/25/2016  . PONV (postoperative nausea and vomiting)   . Shortness of breath dyspnea    heat; walking up set of stairs; pt states can walk up 12-14 stairs slowly without having to stop to catch his breath    SURGICAL HISTORY: Past Surgical History:  Procedure Laterality Date  . CYSTOSCOPY N/A 06/16/2016   Procedure: CYSTOSCOPY;  Surgeon: Franchot Gallo, MD;  Location: [facility];  Service: Urology;  Laterality: N/A;  . IR FLUORO GUIDE PORT INSERTION RIGHT  06/18/2017  . IR US GUIDE VASC ACCESS RIGHT  06/18/2017  . TRANSURETHRAL RESECTION OF BLADDER TUMOR N/A 03/25/2016   Procedure: TRANSURETHRAL RESECTION OF BLADDER TUMOR (TURBT);  Surgeon: Franchot Gallo, MD;  Location: [facility];  Service: Urology;  Laterality: N/A;  . TRANSURETHRAL RESECTION OF BLADDER TUMOR N/A 06/16/2016   Procedure: TRANSURETHRAL RESECTION OF BLADDER TUMOR (TURBT);  Surgeon: Franchot Gallo,  MD;  Location: [facility];  Service: Urology;  Laterality: N/A;  . TRANSURETHRAL RESECTION OF BLADDER TUMOR N/A 12/16/2016   Procedure: TRANSURETHRAL RESECTION OF BLADDER TUMOR (TURBT);  Surgeon: Franchot Gallo, MD;  Location: [facility];  Service: Urology;  Laterality: N/A;  . TRANSURETHRAL RESECTION OF BLADDER TUMOR N/A 05/11/2017   Procedure: TRANSURETHRAL RESECTION OF BLADDER TUMOR (TURBT) CLOT EVACUATION;  Surgeon: Franchot Gallo, MD;  Location: [facility];  Service: Urology;  Laterality: N/A;  1 HR [phone] BLUE  MEDICARE-[id number]    SOCIAL HISTORY: Social History   Social History  . Marital status: Married    Spouse name: N/A  . Number of children: N/A  . Years of education: N/A   Occupational History  . Not on file.   Social History Main Topics  . Smoking status: Current Every Day Smoker    Packs/day: 2.00    Years: 50.00    Types: Cigarettes  . Smokeless tobacco: Never Used  . Alcohol use No     Comment: occasionally  . Drug use: No  . Sexual activity: No   Other Topics Concern  . Not on file   Social History Narrative  . 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 Hypertension Mother   . Asthma Father   . Emphysema Father     Both parents deceased Mom was early 57's when she died; he was around 80-something Dad had asthma and emphysema really bad; thinks his heart finally gave out He says his mother's "intestines broke" and it was "fast forward downhill from there."  He is an only child.   ALLERGIES:  has No Known Allergies.  MEDICATIONS:  Current Outpatient Prescriptions  Medication Sig Dispense Refill  . amLODipine (NORVASC) 5 MG tablet 5 mg.  0  . buPROPion (WELLBUTRIN XL) 150 MG 24 hr tablet 150 mg.  5  . CARBOPLATIN IV Inject into the vein. Day 1 every 21 days    . carvedilol (COREG) 25 MG tablet Take 25 mg by mouth 2 (two) times daily.   11  . citalopram (CELEXA) 20 MG tablet Take 20 mg by mouth at bedtime.   11  . dexamethasone (DECADRON) 4 MG tablet  Take 2 tablets (8 mg total) by mouth daily. Start the day after carboplatin chemotherapy for 2 days. 30 tablet 1  . Diphenhyd-Hydrocort-Nystatin (FIRST-DUKES MOUTHWASH) SUSP Use as directed 5 mLs in the mouth or throat 4 (four) times daily -  before meals and at bedtime. 480 mL 1  . fluticasone (FLONASE) 50 MCG/ACT nasal spray Place 2 sprays into both nostrils daily as needed for allergies or rhinitis.    . furosemide (LASIX) 40 MG tablet   1  . Gemcitabine HCl (GEMZAR IV) Inject into the vein. Day 1, day 8 every 21 days    . guaiFENesin (MUCINEX) 600 MG 12 hr tablet Take 1-2 tablets every 12 hours as needed for phlegm. 30 tablet 0  . lidocaine (XYLOCAINE) 2 % solution  Swish and swallow 26ml three to four times a day as needed for throat pain. 480 mL 2  . lidocaine-prilocaine (EMLA) cream Apply to affected area once 30 g 3  . loratadine (CLARITIN) 10 MG tablet Take 10 mg by mouth daily as needed for allergies.    Marland Kitchen losartan-hydrochlorothiazide (HYZAAR) 100-25 MG tablet   11  . ondansetron (ZOFRAN) 8 MG tablet Take 1 tablet (8 mg total) by mouth 2 (two) times daily as needed for refractory nausea / vomiting. Start on day 3 after carboplatin chemo. 30 tablet 1  . oxybutynin (DITROPAN) 5 MG tablet Take 1 tablet (5 mg total) by mouth every 6 (six) hours as needed for bladder spasms. 20 tablet 0  . prochlorperazine (COMPAZINE) 10 MG tablet Take 1 tablet (10 mg total) by mouth every 6 (six) hours as needed (Nausea or vomiting). 30 tablet 1  . oxyCODONE (OXY IR/ROXICODONE) 5 MG immediate release tablet 5 mg.  0   No current facility-administered medications for this visit.    Review of Systems  Constitutional: Negative for chills, fever and malaise/fatigue.  HENT: Negative.   Eyes: Negative.   Respiratory: Negative for cough and shortness of breath.   Cardiovascular: Negative.  Negative for chest pain.  Gastrointestinal: Negative for constipation and diarrhea.  Genitourinary: Negative for dysuria  and hematuria.  Musculoskeletal: Negative.   Skin: Negative.   Neurological: Negative.   Endo/Heme/Allergies: Negative.   Psychiatric/Behavioral: Negative.   All other systems reviewed and are negative. 14 point ROS was done and is otherwise as detailed above or in HPI    PHYSICAL EXAMINATION: ECOG PERFORMANCE STATUS: 1 - Symptomatic but completely ambulatory  Physical Exam  Constitutional: He is oriented to person, place, and time and well-developed, well-nourished, and in no distress. No distress.  HENT:  Head: Normocephalic and atraumatic.  Mouth/Throat: Oropharynx is clear and moist. No oropharyngeal exudate.  Eyes: Pupils are equal, round, and reactive to light. Conjunctivae and EOM are normal. Right eye exhibits no discharge. Left eye exhibits no discharge. No scleral icterus.  Neck: Normal range of motion. Neck supple. No tracheal deviation present. No thyromegaly present.  Cardiovascular: Normal rate, regular rhythm and normal heart sounds.   No murmur heard. Pulmonary/Chest: Effort normal. No respiratory distress. He has no wheezes.  Scattered rhonchi bilaterally  Abdominal: Soft. Bowel sounds are normal. He exhibits no distension and no mass. There is no tenderness. There is no rebound and no guarding.  Umbilical hernia  Musculoskeletal: Normal range of motion. He exhibits no edema or tenderness.  Lymphadenopathy:    He has no cervical adenopathy.  Neurological: He is alert and oriented to person, place, and time. No cranial nerve deficit. Gait normal. Coordination normal.  Skin: Skin is warm and dry. No rash noted. He is not diaphoretic. No erythema.  Psychiatric: Mood, memory, affect and judgment normal.  Nursing note and vitals reviewed.   LABORATORY DATA:  I have reviewed the data as listed Lab Results  Component Value Date   WBC 4.0 08/31/2017   HGB 8.1 (L) 08/31/2017   HCT 25.0 (L) 08/31/2017   MCV 91.2 08/31/2017   PLT 213 08/31/2017   CMP       Component Value Date/Time   NA 137 08/31/2017 1032   K 4.3 08/31/2017 1032   CL 104 08/31/2017 1032   CO2 24 08/31/2017 1032   GLUCOSE 106 (H) 08/31/2017 1032   BUN 33 (H) 08/31/2017 1032   CREATININE 1.84 (H) 08/31/2017 1032   CALCIUM  8.4 (L) 08/31/2017 1032   PROT 6.4 (L) 08/31/2017 1032   ALBUMIN 2.9 (L) 08/31/2017 1032   AST 21 08/31/2017 1032   ALT 10 (L) 08/31/2017 1032   ALKPHOS 69 08/31/2017 1032   BILITOT 0.2 (L) 08/31/2017 1032   GFRNONAA 36 (L) 08/31/2017 1032   GFRAA 41 (L) 08/31/2017 1032     RADIOGRAPHIC STUDIES: I have personally reviewed the radiological images as listed and agreed with the findings in the report. CLINICAL DATA: Hematuria and frequent urination.  EXAM: CT ABDOMEN AND PELVIS WITHOUT AND WITH CONTRAST  TECHNIQUE: Multidetector CT imaging of the abdomen and pelvis was performed following the standard protocol before and following the bolus administration of intravenous contrast.  CONTRAST: 176mL OMNIPAQUE IOHEXOL 300 MG/ML SOLN  COMPARISON: CT scan 03/31/2013  FINDINGS: Lower chest: The lung bases are clear of acute process. No worrisome pulmonary lesions. No pleural effusion. The heart is normal in size. No pericardial effusion. Coronary artery calcifications are noted. Dense distal aortic calcifications are noted. The esophagus is grossly normal.  Hepatobiliary: Small low-attenuation liver lesions are stable and likely small cysts. Lesion and segment 8 is also stable and likely a hemangioma. No worrisome hepatic lesions or intrahepatic biliary dilatation. The gallbladder is normal. No common bile duct dilatation.  Pancreas: No mass, inflammation or ductal dilatation.  Spleen: Normal size. No focal lesions.  Adrenals/Urinary Tract: The adrenal glands are normal and stable.  Numerous left renal artery calcifications. I do not see any definite calculi in the left collecting system. Small left renal cysts are noted.  The no right renal calculi or hydronephrosis. Both ureters are normal. No ureteral calculi. No significant collecting system abnormality on the delayed images.  There is a large irregular bladder mass at on the right side. This demonstrates moderate contrast enhancement. It measures 3.2 x 3.0 cm. No ureteral obstruction.  Stomach/Bowel: The stomach, duodenum, small bowel and colon are unremarkable without oral contrast. No inflammatory changes, mass lesions or obstructive findings. The terminal ileum is normal. The appendix is normal. Moderate sigmoid diverticulosis without findings for acute diverticulitis.  Vascular/Lymphatic: Advanced aortic and branch vessel atherosclerotic calcifications. No focal aneurysm or dissection. The major venous structures are patent.  No mesenteric or retroperitoneal mass or lymphadenopathy. Small scattered lymph nodes are noted. Small bilateral external iliac artery lymph nodes are stable. No pelvic lymphadenopathy. No inguinal adenopathy. The prostate gland and seminal vesicles are grossly normal. New line small  Small Periumbilical abdominal wall hernia containing fat and a small bowel loop. This has enlarged since prior CT scan.  Other: No subcutaneous lesions or ascites.  Musculoskeletal: No significant bony findings.  IMPRESSION: 1. 3.2 x 3.0 cm bladder mass consistent with transitional cell carcinoma. No findings for local spread of disease or metastatic disease. 2. No worrisome renal or ureteral lesions. 3. Small benign-appearing and stable hepatic lesions. 4. Advanced atherosclerotic calcifications involving the aorta and branch vessels. 5. Small periumbilical abdominal wall hernia containing fat and a small bowel loop.   Electronically Signed  By: Marijo Sanes M.D.  On: 02/18/2016 16:47   ASSESSMENT & PLAN:  Stage II (T2N0M0) Muscle invasive carcinoma of the trigone of the bladder, recurrent disease in 03/2017 with  recurrent bladder tumor and new pulm mets. He now has Stage IV disease. Severe right hydronephrosis due to recurrent bladder carcinoma with synchronous disease in the distal right ureter  Tobacco Abuse CKD COPD Anemia secondary to chemotherapy  Patient is tolerating carboplatin/gemzar well. Proceed with cycle 4 day 1  today, labs permitting. Will plan to restage again after 6 cycles of chemo to assess response to treatment. Again discussed smoke cessation, however patient states he is not interested in quitting. Return to clinic in 3 weeks for follow-up.  This note was electronically signed.    Twana First, MD  09/14/2017 10:51 AM

## 2017-09-21 ENCOUNTER — Encounter (HOSPITAL_BASED_OUTPATIENT_CLINIC_OR_DEPARTMENT_OTHER): Payer: Medicare Other

## 2017-09-21 ENCOUNTER — Encounter (HOSPITAL_COMMUNITY): Payer: Self-pay

## 2017-09-21 VITALS — BP 133/55 | HR 73 | Temp 97.7°F | Resp 18 | Wt 161.6 lb

## 2017-09-21 DIAGNOSIS — C670 Malignant neoplasm of trigone of bladder: Secondary | ICD-10-CM

## 2017-09-21 LAB — CBC WITH DIFFERENTIAL/PLATELET
BASOS ABS: 0 10*3/uL (ref 0.0–0.1)
BASOS PCT: 0 %
EOS ABS: 0 10*3/uL (ref 0.0–0.7)
EOS PCT: 1 %
HCT: 23.1 % — ABNORMAL LOW (ref 39.0–52.0)
Hemoglobin: 7.4 g/dL — ABNORMAL LOW (ref 13.0–17.0)
Lymphocytes Relative: 11 %
Lymphs Abs: 0.3 10*3/uL — ABNORMAL LOW (ref 0.7–4.0)
MCH: 29.5 pg (ref 26.0–34.0)
MCHC: 32 g/dL (ref 30.0–36.0)
MCV: 92 fL (ref 78.0–100.0)
MONO ABS: 0.2 10*3/uL (ref 0.1–1.0)
Monocytes Relative: 7 %
NEUTROS ABS: 2.2 10*3/uL (ref 1.7–7.7)
Neutrophils Relative %: 81 %
PLATELETS: 239 10*3/uL (ref 150–400)
RBC: 2.51 MIL/uL — ABNORMAL LOW (ref 4.22–5.81)
RDW: 19 % — AB (ref 11.5–15.5)
WBC: 2.7 10*3/uL — ABNORMAL LOW (ref 4.0–10.5)

## 2017-09-21 LAB — COMPREHENSIVE METABOLIC PANEL
ALBUMIN: 3.1 g/dL — AB (ref 3.5–5.0)
ALT: 9 U/L — ABNORMAL LOW (ref 17–63)
ANION GAP: 10 (ref 5–15)
AST: 22 U/L (ref 15–41)
Alkaline Phosphatase: 64 U/L (ref 38–126)
BUN: 30 mg/dL — AB (ref 6–20)
CHLORIDE: 104 mmol/L (ref 101–111)
CO2: 24 mmol/L (ref 22–32)
Calcium: 8.3 mg/dL — ABNORMAL LOW (ref 8.9–10.3)
Creatinine, Ser: 1.81 mg/dL — ABNORMAL HIGH (ref 0.61–1.24)
GFR calc Af Amer: 42 mL/min — ABNORMAL LOW (ref >60)
GFR calc non Af Amer: 36 mL/min — ABNORMAL LOW (ref >60)
GLUCOSE: 108 mg/dL — AB (ref 65–99)
Potassium: 4.5 mmol/L (ref 3.5–5.1)
SODIUM: 138 mmol/L (ref 135–145)
Total Bilirubin: 0.4 mg/dL (ref 0.3–1.2)
Total Protein: 6.5 g/dL (ref 6.5–8.1)

## 2017-09-21 LAB — PREPARE RBC (CROSSMATCH)

## 2017-09-21 MED ORDER — SODIUM CHLORIDE 0.9% FLUSH
10.0000 mL | INTRAVENOUS | Status: DC | PRN
  Administered 2017-09-21: 10 mL
  Filled 2017-09-21: qty 10

## 2017-09-21 MED ORDER — GEMCITABINE HCL CHEMO INJECTION 1 GM/26.3ML
1000.0000 mg/m2 | Freq: Once | INTRAVENOUS | Status: AC
  Administered 2017-09-21: 1900 mg via INTRAVENOUS
  Filled 2017-09-21: qty 49.97

## 2017-09-21 MED ORDER — HEPARIN SOD (PORK) LOCK FLUSH 100 UNIT/ML IV SOLN
500.0000 [IU] | Freq: Once | INTRAVENOUS | Status: AC | PRN
  Administered 2017-09-21: 500 [IU]
  Filled 2017-09-21: qty 5

## 2017-09-21 MED ORDER — SODIUM CHLORIDE 0.9 % IV SOLN
Freq: Once | INTRAVENOUS | Status: AC
  Administered 2017-09-21: 11:00:00 via INTRAVENOUS

## 2017-09-21 MED ORDER — PROCHLORPERAZINE MALEATE 10 MG PO TABS
10.0000 mg | ORAL_TABLET | Freq: Once | ORAL | Status: AC
  Administered 2017-09-21: 10 mg via ORAL
  Filled 2017-09-21: qty 1

## 2017-09-21 NOTE — Patient Instructions (Signed)
[facility] Discharge Instructions for Patients Receiving Chemotherapy   Beginning January 23rd 2017 lab work for the Cancer Center will be done in the  Main lab at [facility] on 1st floor. If you have a lab appointment with the Cancer Center please come in thru the  Main Entrance and check in at the main information desk   Today you received the following chemotherapy agents Gemzar. Follow-up as scheduled. Call clinic for any questions or concerns  To help prevent nausea and vomiting after your treatment, we encourage you to take your nausea medication   If you develop nausea and vomiting, or diarrhea that is not controlled by your medication, call the clinic.  The clinic phone number is [phone]. Office hours are Monday-Friday 8:30am-5:00pm.  BELOW ARE SYMPTOMS THAT SHOULD BE REPORTED IMMEDIATELY:  *FEVER GREATER THAN 101.0 F  *CHILLS WITH OR WITHOUT FEVER  NAUSEA AND VOMITING THAT IS NOT CONTROLLED WITH YOUR NAUSEA MEDICATION  *UNUSUAL SHORTNESS OF BREATH  *UNUSUAL BRUISING OR BLEEDING  TENDERNESS IN MOUTH AND THROAT WITH OR WITHOUT PRESENCE OF ULCERS  *URINARY PROBLEMS  *BOWEL PROBLEMS  UNUSUAL RASH Items with * indicate a potential emergency and should be followed up as soon as possible. If you have an emergency after office hours please contact your primary care physician or go to the nearest emergency department.  Please call the clinic during office hours if you have any questions or concerns.   You may also contact the Patient Navigator at [phone] should you have any questions or need assistance in obtaining follow up care.      Resources For Cancer Patients and their Caregivers ? American Cancer Society: Can assist with transportation, wigs, general needs, runs Look Good Feel Better.        1-888-227-6333 ? Cancer Care: Provides financial assistance, online support groups, medication/co-pay assistance.  1-800-813-HOPE  (4673) ? [facility] Assists [place] cancer patients and their families through emotional , educational and financial support.  336-427-4357 ? [place] DSS Where to apply for food stamps, Medicaid and utility assistance. 336-342-1394 ? [facility]: Transportation to medical appointments. 336-347-2287 ? Social Security Administration: May apply for disability if have a Stage IV cancer. 336-342-7796 1-800-772-1213 ? [place] Aging, Disability and Transit Services: Assists with nutrition, care and transit needs. 336-349-2343         

## 2017-09-21 NOTE — Progress Notes (Signed)
Carl Murphy reviewed with Dr. Talbert Cage and pt approved for Gemzar infusion today and orders obtained for blood transfusion this week per MD .                                             Carl Murphy tolerated Gemzar infusion well without complaints or incident. Pt to return tomorrow for blood transfusion for Hgb of 7.4. VSS upon discharge. Pt discharged self ambulatory in satisfactory condition

## 2017-09-22 ENCOUNTER — Encounter (HOSPITAL_COMMUNITY): Payer: Medicare Other | Attending: Oncology

## 2017-09-22 DIAGNOSIS — C670 Malignant neoplasm of trigone of bladder: Secondary | ICD-10-CM | POA: Diagnosis not present

## 2017-09-22 MED ORDER — DIPHENHYDRAMINE HCL 25 MG PO CAPS
ORAL_CAPSULE | ORAL | Status: AC
  Filled 2017-09-22: qty 1

## 2017-09-22 MED ORDER — ACETAMINOPHEN 325 MG PO TABS
650.0000 mg | ORAL_TABLET | Freq: Once | ORAL | Status: AC
  Administered 2017-09-22: 650 mg via ORAL

## 2017-09-22 MED ORDER — SODIUM CHLORIDE 0.9 % IV SOLN
250.0000 mL | Freq: Once | INTRAVENOUS | Status: AC
  Administered 2017-09-22: 250 mL via INTRAVENOUS

## 2017-09-22 MED ORDER — ACETAMINOPHEN 325 MG PO TABS
ORAL_TABLET | ORAL | Status: AC
  Filled 2017-09-22: qty 2

## 2017-09-22 MED ORDER — HEPARIN SOD (PORK) LOCK FLUSH 100 UNIT/ML IV SOLN
500.0000 [IU] | Freq: Every day | INTRAVENOUS | Status: AC | PRN
  Administered 2017-09-22: 500 [IU]
  Filled 2017-09-22: qty 5

## 2017-09-22 MED ORDER — DIPHENHYDRAMINE HCL 25 MG PO CAPS
25.0000 mg | ORAL_CAPSULE | Freq: Once | ORAL | Status: AC
  Administered 2017-09-22: 25 mg via ORAL

## 2017-09-22 NOTE — Progress Notes (Signed)
Tolerated transfusion w/o adverse reaction.  Alert, in no distress.  VSS.  Discharged ambulatory.  

## 2017-09-23 LAB — TYPE AND SCREEN
ABO/RH(D): O POS
ANTIBODY SCREEN: NEGATIVE
UNIT DIVISION: 0
Unit division: 0

## 2017-09-23 LAB — BPAM RBC
BLOOD PRODUCT EXPIRATION DATE: 201811162359
BLOOD PRODUCT EXPIRATION DATE: 201811162359
ISSUE DATE / TIME: 201810230903
ISSUE DATE / TIME: 201810231052
UNIT TYPE AND RH: 5100
Unit Type and Rh: 5100

## 2017-10-01 ENCOUNTER — Ambulatory Visit (INDEPENDENT_AMBULATORY_CARE_PROVIDER_SITE_OTHER): Payer: Medicare Other | Admitting: Otolaryngology

## 2017-10-01 DIAGNOSIS — H903 Sensorineural hearing loss, bilateral: Secondary | ICD-10-CM | POA: Diagnosis not present

## 2017-10-05 ENCOUNTER — Encounter (HOSPITAL_BASED_OUTPATIENT_CLINIC_OR_DEPARTMENT_OTHER): Payer: Medicare Other | Admitting: Oncology

## 2017-10-05 ENCOUNTER — Other Ambulatory Visit (HOSPITAL_COMMUNITY): Payer: Self-pay | Admitting: Oncology

## 2017-10-05 ENCOUNTER — Encounter (HOSPITAL_COMMUNITY): Payer: Self-pay | Admitting: Oncology

## 2017-10-05 ENCOUNTER — Encounter (HOSPITAL_COMMUNITY): Payer: Medicare Other | Attending: Oncology

## 2017-10-05 VITALS — BP 145/63 | HR 71 | Temp 97.6°F | Resp 18

## 2017-10-05 VITALS — BP 133/69 | HR 71 | Temp 98.4°F | Resp 20 | Wt 174.6 lb

## 2017-10-05 DIAGNOSIS — N189 Chronic kidney disease, unspecified: Secondary | ICD-10-CM | POA: Diagnosis not present

## 2017-10-05 DIAGNOSIS — Z720 Tobacco use: Secondary | ICD-10-CM

## 2017-10-05 DIAGNOSIS — D6481 Anemia due to antineoplastic chemotherapy: Secondary | ICD-10-CM | POA: Diagnosis not present

## 2017-10-05 DIAGNOSIS — N1330 Unspecified hydronephrosis: Secondary | ICD-10-CM

## 2017-10-05 DIAGNOSIS — C670 Malignant neoplasm of trigone of bladder: Secondary | ICD-10-CM | POA: Diagnosis present

## 2017-10-05 DIAGNOSIS — C7800 Secondary malignant neoplasm of unspecified lung: Secondary | ICD-10-CM

## 2017-10-05 DIAGNOSIS — Z5111 Encounter for antineoplastic chemotherapy: Secondary | ICD-10-CM | POA: Diagnosis not present

## 2017-10-05 LAB — COMPREHENSIVE METABOLIC PANEL
ALT: 11 U/L — AB (ref 17–63)
ANION GAP: 10 (ref 5–15)
AST: 19 U/L (ref 15–41)
Albumin: 3 g/dL — ABNORMAL LOW (ref 3.5–5.0)
Alkaline Phosphatase: 51 U/L (ref 38–126)
BUN: 22 mg/dL — ABNORMAL HIGH (ref 6–20)
CALCIUM: 8.4 mg/dL — AB (ref 8.9–10.3)
CO2: 23 mmol/L (ref 22–32)
Chloride: 101 mmol/L (ref 101–111)
Creatinine, Ser: 1.81 mg/dL — ABNORMAL HIGH (ref 0.61–1.24)
GFR, EST AFRICAN AMERICAN: 42 mL/min — AB (ref >60)
GFR, EST NON AFRICAN AMERICAN: 36 mL/min — AB (ref >60)
Glucose, Bld: 103 mg/dL — ABNORMAL HIGH (ref 65–99)
POTASSIUM: 4.1 mmol/L (ref 3.5–5.1)
Sodium: 134 mmol/L — ABNORMAL LOW (ref 135–145)
TOTAL PROTEIN: 6.4 g/dL — AB (ref 6.5–8.1)
Total Bilirubin: 0.4 mg/dL (ref 0.3–1.2)

## 2017-10-05 LAB — CBC WITH DIFFERENTIAL/PLATELET
BASOS PCT: 0 %
Basophils Absolute: 0 10*3/uL (ref 0.0–0.1)
EOS ABS: 0 10*3/uL (ref 0.0–0.7)
Eosinophils Relative: 1 %
HCT: 26.6 % — ABNORMAL LOW (ref 39.0–52.0)
Hemoglobin: 8.8 g/dL — ABNORMAL LOW (ref 13.0–17.0)
Lymphocytes Relative: 11 %
Lymphs Abs: 0.3 10*3/uL — ABNORMAL LOW (ref 0.7–4.0)
MCH: 30.7 pg (ref 26.0–34.0)
MCHC: 33.1 g/dL (ref 30.0–36.0)
MCV: 92.7 fL (ref 78.0–100.0)
MONO ABS: 1.1 10*3/uL — AB (ref 0.1–1.0)
Monocytes Relative: 41 %
NEUTROS ABS: 1.4 10*3/uL — AB (ref 1.7–7.7)
Neutrophils Relative %: 47 %
PLATELETS: 268 10*3/uL (ref 150–400)
RBC: 2.87 MIL/uL — ABNORMAL LOW (ref 4.22–5.81)
RDW: 17.4 % — AB (ref 11.5–15.5)
WBC: 2.8 10*3/uL — ABNORMAL LOW (ref 4.0–10.5)

## 2017-10-05 MED ORDER — SODIUM CHLORIDE 0.9 % IV SOLN
332.5000 mg | Freq: Once | INTRAVENOUS | Status: AC
  Administered 2017-10-05: 330 mg via INTRAVENOUS
  Filled 2017-10-05: qty 33

## 2017-10-05 MED ORDER — HEPARIN SOD (PORK) LOCK FLUSH 100 UNIT/ML IV SOLN
500.0000 [IU] | Freq: Once | INTRAVENOUS | Status: AC | PRN
  Administered 2017-10-05: 500 [IU]

## 2017-10-05 MED ORDER — DEXAMETHASONE SODIUM PHOSPHATE 10 MG/ML IJ SOLN
INTRAMUSCULAR | Status: AC
  Filled 2017-10-05: qty 1

## 2017-10-05 MED ORDER — PALONOSETRON HCL INJECTION 0.25 MG/5ML
INTRAVENOUS | Status: AC
  Filled 2017-10-05: qty 5

## 2017-10-05 MED ORDER — DEXAMETHASONE SODIUM PHOSPHATE 10 MG/ML IJ SOLN
10.0000 mg | Freq: Once | INTRAMUSCULAR | Status: AC
  Administered 2017-10-05: 10 mg via INTRAVENOUS

## 2017-10-05 MED ORDER — GEMCITABINE HCL CHEMO INJECTION 1 GM/26.3ML
1000.0000 mg/m2 | Freq: Once | INTRAVENOUS | Status: AC
  Administered 2017-10-05: 1900 mg via INTRAVENOUS
  Filled 2017-10-05: qty 49.97

## 2017-10-05 MED ORDER — PALONOSETRON HCL INJECTION 0.25 MG/5ML
0.2500 mg | Freq: Once | INTRAVENOUS | Status: AC
  Administered 2017-10-05: 0.25 mg via INTRAVENOUS

## 2017-10-05 MED ORDER — SODIUM CHLORIDE 0.9% FLUSH
10.0000 mL | INTRAVENOUS | Status: DC | PRN
  Administered 2017-10-05: 10 mL
  Filled 2017-10-05: qty 10

## 2017-10-05 MED ORDER — SODIUM CHLORIDE 0.9 % IV SOLN
Freq: Once | INTRAVENOUS | Status: AC
Start: 2017-10-05 — End: 2017-10-05
  Administered 2017-10-05: 11:00:00 via INTRAVENOUS

## 2017-10-05 NOTE — Progress Notes (Signed)
Reviewed labs with Dr. Talbert Cage and ok to treat today.   Patient tolerated chemotherapy with no complaints voiced.  Port site clean and dry with no bruising or swelling noted at site.  Band aid applied.  VSS with discharge and left ambulatory with no s/s of distress noted.

## 2017-10-05 NOTE — Patient Instructions (Signed)
[facility] Discharge Instructions for Patients Receiving Chemotherapy  Today you received the following chemotherapy agents gemzar and carboplatin.     If you develop nausea and vomiting that is not controlled by your nausea medication, call the clinic.   BELOW ARE SYMPTOMS THAT SHOULD BE REPORTED IMMEDIATELY:  *FEVER GREATER THAN 100.5 F  *CHILLS WITH OR WITHOUT FEVER  NAUSEA AND VOMITING THAT IS NOT CONTROLLED WITH YOUR NAUSEA MEDICATION  *UNUSUAL SHORTNESS OF BREATH  *UNUSUAL BRUISING OR BLEEDING  TENDERNESS IN MOUTH AND THROAT WITH OR WITHOUT PRESENCE OF ULCERS  *URINARY PROBLEMS  *BOWEL PROBLEMS  UNUSUAL RASH Items with * indicate a potential emergency and should be followed up as soon as possible.  Feel free to call the clinic should you have any questions or concerns. The clinic phone number is [phone].  Please show the [place] at check-in to the Emergency Department and triage nurse.

## 2017-10-05 NOTE — Patient Instructions (Signed)
[place] at [facility] Discharge Instructions  RECOMMENDATIONS MADE BY THE CONSULTANT AND ANY TEST RESULTS WILL BE SENT TO YOUR REFERRING PHYSICIAN.  You were seen today by Dr. Twana First You will have treatment today as long as your lab work allows We will refer you to your Urologist for the bleeding and spasms Continue to follow up as scheduled.   Thank you for choosing [place] at [facility] to provide your oncology and hematology care.  To afford each patient quality time with our provider, please arrive at least 15 minutes before your scheduled appointment time.    If you have a lab appointment with the [facility] please come in thru the  Main Entrance and check in at the main information desk  You need to re-schedule your appointment should you arrive 10 or more minutes late.  We strive to give you quality time with our providers, and arriving late affects you and other patients whose appointments are after yours.  Also, if you no show three or more times for appointments you may be dismissed from the clinic at the providers discretion.     Again, thank you for choosing [facility].  Our hope is that these requests will decrease the amount of time that you wait before being seen by our physicians.       _____________________________________________________________  Should you have questions after your visit to [facility], please contact our office at [phone] between the hours of 8:30 a.m. and 4:30 p.m.  Voicemails left after 4:30 p.m. will not be returned until the following business day.  For prescription refill requests, have your pharmacy contact our office.       Resources For Cancer Patients and their Caregivers ? American Cancer Society: Can assist with transportation, wigs, general needs, runs Look Good Feel Better.        (367) 404-6968 ? Cancer Care: Provides financial assistance,  online support groups, medication/co-pay assistance.  1-800-813-HOPE 218-507-1578) ? [facility] Assists [place] cancer patients and their families through emotional , educational and financial support.  608-077-8100 ? [place] DSS Where to apply for food stamps, Medicaid and utility assistance. 579-767-0395 ? [employer]: Transportation to medical appointments. 785-601-1564 ? Social Security Administration: May apply for disability if have a Stage IV cancer. 262-612-0500 651-048-9455 ? [employer], Disability and Transit Services: Assists with nutrition, care and transit needs. [place] Support Programs: @10RELATIVEDAYS @ > Cancer Support Group  2nd Tuesday of the month 1pm-2pm, Journey Room  > Creative Journey  3rd Tuesday of the month 1130am-1pm, Journey Room  > Look Good Feel Better  1st Wednesday of the month 10am-12 noon, Journey Room (Call [place] to register [phone])

## 2017-10-05 NOTE — Progress Notes (Signed)
[name]  PROGRESS NOTE  Patient Care Team: [name], MD as PCP - General (Internal Medicine)  CHIEF COMPLAINTS/PURPOSE OF CONSULTATION:   Oncology History   Per available records had bladder tumor removed but had extension into the muscularis propria. CT showed no extension beyond the bladder. Has seen 2 urologists in consultation. [place] about not having a cystectomy and ileal conduit.     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 No findings for local spread of disease or metastatic disease      03/25/2016 Surgery    TURBT 3cm right trigonal bladder tumor. followed by intravesical epirubicin instillation      03/25/2016 Pathology Results    bladder TURBT infiltrative high grade papillary urothelial carcinoma, invades the mulcularis propria (detrusor) no LVI       04/14/2016 Miscellaneous    [name] consultation in [place] for consideration of cystectomy. patient also offered repeat resection with BCG, and CHEMO/XRT      06/17/2016 Pathology Results    TURB- high grade papillary urothelial carcinoma with focal stromal invasion       Radiation Therapy    Planned to be completed on 09/10/2016.      07/22/2016 -  Chemotherapy    The patient had palonosetron (ALOXI) injection 0.25 mg, 0.25 mg, Intravenous,  Once, 2 of 5 cycles Administration: 0.25 mg (07/28/2016)  CARBOplatin (PARAPLATIN) 160 mg in sodium chloride 0.9 % 100 mL chemo infusion, 160 mg (100 % of original dose 160.6 mg), Intravenous,  Once, 2 of 5 cycles Dose modification:   (original dose 160.6 mg, Cycle 1),   (original dose 160.6 mg, Cycle 1),   (original dose 160.6 mg, Cycle 1),   (original dose 160.6 mg, Cycle 2) Administration: 180 mg (07/28/2016)  for chemotherapy treatment.        04/07/2017 Relapse/Recurrence    CT Abd/pelvis: IMPRESSION: 1. Recurrent bladder carcinoma with  synchronous disease in the distal right ureter causing severe right hydronephrosis and decreased right renal function. 2. Bilateral renal stones. 3. Aortic atherosclerosis (ICD10-170.0). Coronary artery calcification. 4. Small periumbilical hernia contains a knuckle of unobstructed small bowel.       06/10/2017 PET scan    1. Metachronous urothelial carcinoma within the distal right ureter, as on prior CT. 2. No evidence of nodal metastasis. 3. Bilateral hypermetabolic pulmonary nodules, favoring pulmonary metastasis. Especially given emphysema, 1 or more primary bronchogenic carcinomas cannot be excluded but are felt less likely. 4. Coronary artery atherosclerosis. Aortic Atherosclerosis (ICD10-I70.0). 5. Bladder not well evaluated secondary to hypermetabolic urine within.       06/18/2017 Procedure    Placement of single lumen port a cath via right internal jugular vein. The catheter tip lies at the cavo-atrial junction. A power injectable port a cath was placed and is ready for immediate use.      06/29/2017 -  Chemotherapy    Carboplatin/Gemcitabine every 21 days       08/14/2017 Imaging    CT C/A/P IMPRESSION: 1. Overall stable pulmonary nodules. Knee soft tissue rim around the cavitary lesion in the left upper lobe is slightly thinner. No new pulmonary nodules or progressive findings in the chest. 2. Stable advanced emphysematous changes and pulmonary scarring. 3. No mediastinal or hilar mass or lymphadenopathy. 4. Improved CT appearance of the bladder. The asymmetric areas of bladder wall enhancement have improved. There is  still diffuse irregular bladder wall thickening and distal right ureteral tumor appears stable. Stable right-sided hydroureteronephrosis. 5. Stable cirrhotic changes involving the liver and stable liver cysts. 6. Stable scattered mesenteric and retroperitoneal and pelvic lymph nodes but no mass or overt adenopathy. 7. Stable severe/advanced  atherosclerotic calcifications involving the thoracic and abdominal aorta and branch vessels.      COPD Hypertensive cardiomyopathy  HISTORY OF PRESENTING ILLNESS:  Carl Murphy 71 y.o. male is here for ongoing follow-up of muscle invasive carcinoma of the bladder. He completed concurrent carbo/XRT. Patient has refused cystectomy.  Patient was hospitalized at [facility] from 05/07/17 through 05/12/17 for hematuria. Patient was placed on continuous bladder irrigation for persistent severe hematuria for which she was given 5 units PRBC transfusion. He was seen by his urologist who taken to the OR on 05/11/17 for cystoscopy, clot evaluation, TURBT 7 cm recurrent bladder tumor and placement of a right double J stent was attempted. Final pathology demonstrated a muscle invasive disease. His hemoglobin at the time of discharge was 8.9 g/dL. Patient underwent CT abdomen and pelvis with and without contrast on 04/07/17 which demonstrated recurrent bladder carcinoma with synchronous disease in the distal right ureter causing severe right hydronephrosis and decreased right renal function. Dr. Diona Fanti had recommended for patient to undergo a right ureteral stent for his right hydronephrosis by IR, but patient has refused so far. He was seen by Dr. Alen Blew, oncology at [facility], who recommended for patient to get a PET scans rule out metastatic disease and systemic chemotherapy for palliative treatment of his recurrent bladder cancer. Patient underwent a PET scan on 06/10/17 which demonstrated metasynchronous urothelial carcinoma within the distal right ureter, no evidence of nodal metastasis, bilateral hypermetabolic pulmonary nodules favoring pulmonary metastasis.  Patient presents today for cycle 5-day 1 of chemotherapy.  States that last week he had 3 days were he had intermittent blood clots in his urine.  He is no longer having blood clots in his urine.  He continues to have urinary frequency.  He states  that his appetite is fair.  Energy level is stable.  He denies any chest pain, shortness breath, abdominal pain, focal weakness, recent fevers chills, no recent infections.  MEDICAL HISTORY:  Past Medical History:  Diagnosis Date  . Arthritis   . Cancer [facility])    Bladder Cancer  . CHF (congestive heart failure) ([place])   . COPD (chronic obstructive pulmonary disease) ([place])   . Depression   . Hard of hearing    bilat   . Hypertension   . Malignant neoplasm of trigone of bladder ([place]) 03/25/2016  . PONV (postoperative nausea and vomiting)   . Shortness of breath dyspnea    heat; walking up set of stairs; pt states can walk up 12-14 stairs slowly without having to stop to catch his breath    SURGICAL HISTORY: Past Surgical History:  Procedure Laterality Date  . IR FLUORO GUIDE PORT INSERTION RIGHT  06/18/2017  . IR US GUIDE VASC ACCESS RIGHT  06/18/2017    SOCIAL HISTORY: Social History   Socioeconomic History  . Marital status: Married    Spouse name: Not on file  . Number of children: Not on file  . Years of education: Not on file  . Highest education level: Not on file  Social Needs  . Financial resource strain: Not on file  . Food insecurity - worry: Not on file  . Food insecurity - inability: Not on file  . Transportation needs -  medical: Not on file  . Transportation needs - non-medical: Not on file  Occupational History  . Not on file  Tobacco Use  . Smoking status: Current Every Day Smoker    Packs/day: 2.00    Years: 50.00    Pack years: 100.00    Types: Cigarettes  . Smokeless tobacco: Never Used  Substance and Sexual Activity  . Alcohol use: No    Comment: occasionally  . Drug use: No  . Sexual activity: No    Birth control/protection: None  Other Topics Concern  . Not on file  Social History Narrative  . 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 Hypertension Mother   . Asthma Father   . Emphysema Father     Both parents deceased Mom was early 5's when she died; he was around 80-something Dad had asthma and emphysema really bad; thinks his heart finally gave out He says his mother's "intestines broke" and it was "fast forward downhill from there."  He is an only child.   ALLERGIES:  has No Known Allergies.  MEDICATIONS:  Current Outpatient Medications  Medication Sig Dispense Refill  . CARBOPLATIN IV Inject into the vein. Day 1 every 21 days    . carvedilol (COREG) 25 MG tablet Take 25 mg by mouth 2 (two) times daily.   11  . citalopram (CELEXA) 20 MG tablet Take 20 mg by mouth at bedtime.   11  . dexamethasone (DECADRON) 4 MG tablet Take 2 tablets (8 mg total) by mouth daily. Start the day after carboplatin chemotherapy for 2 days. 30 tablet 1  . Diphenhyd-Hydrocort-Nystatin (FIRST-DUKES MOUTHWASH) SUSP Use as directed 5 mLs in the mouth or throat 4 (four) times daily -  before meals and at bedtime. 480 mL 1  . fluticasone (FLONASE) 50 MCG/ACT nasal spray Place 2 sprays into both nostrils daily as needed for allergies or rhinitis.    . Gemcitabine HCl (GEMZAR IV) Inject into the vein. Day 1, day 8 every 21 days    . lidocaine (XYLOCAINE) 2 % solution Swish and swallow 29ml three to four times a day as needed for throat pain. 480 mL 2  . lidocaine-prilocaine (EMLA) cream Apply to affected area once 30 g 3  . ondansetron (ZOFRAN) 8 MG tablet Take 1 tablet (8 mg total) by mouth 2 (two) times daily as needed for refractory nausea / vomiting.  Start on day 3 after carboplatin chemo. 30 tablet 1  . oxybutynin (DITROPAN) 5 MG tablet Take 1 tablet (5 mg total) by mouth every 6 (six) hours as needed for bladder spasms. 20 tablet 0  . prochlorperazine (COMPAZINE) 10 MG tablet Take 1 tablet (10 mg total) by mouth every 6 (six) hours as needed (Nausea or vomiting). 30 tablet 1   No current facility-administered medications for this visit.    Review of Systems  Constitutional: Positive for malaise/fatigue. Negative for chills and fever.  HENT: Negative.   Eyes: Negative.  Respiratory: Negative for cough and shortness of breath.   Cardiovascular: Negative.  Negative for chest pain.  Gastrointestinal: Negative for constipation and diarrhea.  Genitourinary: Positive for frequency. Negative for dysuria and hematuria.       Blood clots in urine for 3 days last week  Musculoskeletal: Negative.   Skin: Negative.   Neurological: Negative.   Endo/Heme/Allergies: Negative.   Psychiatric/Behavioral: Negative.   All other systems reviewed and are negative. 14 point ROS was done and is otherwise as detailed above or in HPI    PHYSICAL EXAMINATION: ECOG PERFORMANCE STATUS: 1 - Symptomatic but completely ambulatory  Physical Exam  Constitutional: He is oriented to person, place, and time and well-developed, well-nourished, and in no distress. No distress.  HENT:  Head: Normocephalic and atraumatic.  Mouth/Throat: Oropharynx is clear and moist. No oropharyngeal exudate.  Eyes: Conjunctivae and EOM are normal. Pupils are equal, round, and reactive to light. Right eye exhibits no discharge. Left eye exhibits no discharge. No scleral icterus.  Neck: Normal range of motion. Neck supple. No tracheal deviation present. No thyromegaly present.  Cardiovascular: Normal rate, regular rhythm and normal heart sounds.  No murmur heard. Pulmonary/Chest: Effort normal and breath sounds normal. No respiratory distress. He has no wheezes.  Abdominal: Soft.  Bowel sounds are normal. He exhibits no distension and no mass. There is no tenderness. There is no rebound and no guarding.  Umbilical hernia  Musculoskeletal: Normal range of motion. He exhibits no edema or tenderness.  Lymphadenopathy:    He has no cervical adenopathy.  Neurological: He is alert and oriented to person, place, and time. No cranial nerve deficit. Gait normal. Coordination normal.  Skin: Skin is warm and dry. No rash noted. He is not diaphoretic. No erythema.  Psychiatric: Mood, memory, affect and judgment normal.  Nursing note and vitals reviewed.   LABORATORY DATA:  I have reviewed the data as listed Lab Results  Component Value Date   WBC 2.7 (L) 09/21/2017   HGB 7.4 (L) 09/21/2017   HCT 23.1 (L) 09/21/2017   MCV 92.0 09/21/2017   PLT 239 09/21/2017   CMP     Component Value Date/Time   NA 138 09/21/2017 0953   K 4.5 09/21/2017 0953   CL 104 09/21/2017 0953   CO2 24 09/21/2017 0953   GLUCOSE 108 (H) 09/21/2017 0953   BUN 30 (H) 09/21/2017 0953   CREATININE 1.81 (H) 09/21/2017 0953   CALCIUM 8.3 (L) 09/21/2017 0953   PROT 6.5 09/21/2017 0953   ALBUMIN 3.1 (L) 09/21/2017 0953   AST 22 09/21/2017 0953   ALT 9 (L) 09/21/2017 0953   ALKPHOS 64 09/21/2017 0953   BILITOT 0.4 09/21/2017 0953   GFRNONAA 36 (L) 09/21/2017 0953   GFRAA 42 (L) 09/21/2017 0953     RADIOGRAPHIC STUDIES: I have personally reviewed the radiological images as listed and agreed with the findings in the report. CLINICAL DATA: Hematuria and frequent urination.  EXAM: CT ABDOMEN AND PELVIS WITHOUT AND WITH CONTRAST  TECHNIQUE: Multidetector CT imaging of the abdomen and pelvis was performed following the standard protocol before and following the bolus administration of intravenous contrast.  CONTRAST: 158mL OMNIPAQUE IOHEXOL 300 MG/ML SOLN  COMPARISON: CT scan 03/31/2013  FINDINGS: Lower chest: The lung bases are clear of acute process. No worrisome pulmonary  lesions. No pleural effusion. The heart is normal in size. No pericardial effusion. Coronary artery calcifications are noted. Dense distal aortic calcifications are noted. The esophagus is grossly normal.  Hepatobiliary:  Small low-attenuation liver lesions are stable and likely small cysts. Lesion and segment 8 is also stable and likely a hemangioma. No worrisome hepatic lesions or intrahepatic biliary dilatation. The gallbladder is normal. No common bile duct dilatation.  Pancreas: No mass, inflammation or ductal dilatation.  Spleen: Normal size. No focal lesions.  Adrenals/Urinary Tract: The adrenal glands are normal and stable.  Numerous left renal artery calcifications. I do not see any definite calculi in the left collecting system. Small left renal cysts are noted. The no right renal calculi or hydronephrosis. Both ureters are normal. No ureteral calculi. No significant collecting system abnormality on the delayed images.  There is a large irregular bladder mass at on the right side. This demonstrates moderate contrast enhancement. It measures 3.2 x 3.0 cm. No ureteral obstruction.  Stomach/Bowel: The stomach, duodenum, small bowel and colon are unremarkable without oral contrast. No inflammatory changes, mass lesions or obstructive findings. The terminal ileum is normal. The appendix is normal. Moderate sigmoid diverticulosis without findings for acute diverticulitis.  Vascular/Lymphatic: Advanced aortic and branch vessel atherosclerotic calcifications. No focal aneurysm or dissection. The major venous structures are patent.  No mesenteric or retroperitoneal mass or lymphadenopathy. Small scattered lymph nodes are noted. Small bilateral external iliac artery lymph nodes are stable. No pelvic lymphadenopathy. No inguinal adenopathy. The prostate gland and seminal vesicles are grossly normal. New line small  Small Periumbilical abdominal wall hernia  containing fat and a small bowel loop. This has enlarged since prior CT scan.  Other: No subcutaneous lesions or ascites.  Musculoskeletal: No significant bony findings.  IMPRESSION: 1. 3.2 x 3.0 cm bladder mass consistent with transitional cell carcinoma. No findings for local spread of disease or metastatic disease. 2. No worrisome renal or ureteral lesions. 3. Small benign-appearing and stable hepatic lesions. 4. Advanced atherosclerotic calcifications involving the aorta and branch vessels. 5. Small periumbilical abdominal wall hernia containing fat and a small bowel loop.   Electronically Signed  By: Marijo Sanes M.D.  On: 02/18/2016 16:47   ASSESSMENT & PLAN:  Stage II (T2N0M0) Muscle invasive carcinoma of the trigone of the bladder, recurrent disease in 03/2017 with recurrent bladder tumor and new pulm mets. He now has Stage IV disease. Severe right hydronephrosis due to recurrent bladder carcinoma with synchronous disease in the distal right ureter  Tobacco Abuse CKD COPD Anemia secondary to chemotherapy  Awaiting his lab results, if labs are okay will proceed with cycle 5-day 1 of chemotherapy with carboplatin/gemzar. I have advised him that should he have blood clots again his urine in the future, he is to call his urologist since he may need a repeat cystoscopy to be performed.   Continue chemotherapy as scheduled.  Return to clinic in 3 weeks for follow-up.  This note was electronically signed.    Twana First, MD  10/05/2017 10:44 AM

## 2017-10-12 ENCOUNTER — Encounter (HOSPITAL_COMMUNITY): Payer: Self-pay

## 2017-10-12 ENCOUNTER — Encounter (HOSPITAL_BASED_OUTPATIENT_CLINIC_OR_DEPARTMENT_OTHER): Payer: Medicare Other

## 2017-10-12 DIAGNOSIS — C670 Malignant neoplasm of trigone of bladder: Secondary | ICD-10-CM | POA: Diagnosis not present

## 2017-10-12 LAB — COMPREHENSIVE METABOLIC PANEL
ALK PHOS: 54 U/L (ref 38–126)
ALT: 12 U/L — ABNORMAL LOW (ref 17–63)
ANION GAP: 9 (ref 5–15)
AST: 26 U/L (ref 15–41)
Albumin: 3.2 g/dL — ABNORMAL LOW (ref 3.5–5.0)
BILIRUBIN TOTAL: 0.5 mg/dL (ref 0.3–1.2)
BUN: 45 mg/dL — ABNORMAL HIGH (ref 6–20)
CALCIUM: 8.5 mg/dL — AB (ref 8.9–10.3)
CO2: 24 mmol/L (ref 22–32)
Chloride: 99 mmol/L — ABNORMAL LOW (ref 101–111)
Creatinine, Ser: 1.93 mg/dL — ABNORMAL HIGH (ref 0.61–1.24)
GFR, EST AFRICAN AMERICAN: 39 mL/min — AB (ref >60)
GFR, EST NON AFRICAN AMERICAN: 34 mL/min — AB (ref >60)
Glucose, Bld: 114 mg/dL — ABNORMAL HIGH (ref 65–99)
Potassium: 4.2 mmol/L (ref 3.5–5.1)
SODIUM: 132 mmol/L — AB (ref 135–145)
TOTAL PROTEIN: 6.6 g/dL (ref 6.5–8.1)

## 2017-10-12 LAB — CBC WITH DIFFERENTIAL/PLATELET
BASOS ABS: 0 10*3/uL (ref 0.0–0.1)
Basophils Relative: 0 %
EOS ABS: 0.1 10*3/uL (ref 0.0–0.7)
Eosinophils Relative: 1 %
HEMATOCRIT: 26.7 % — AB (ref 39.0–52.0)
HEMOGLOBIN: 9 g/dL — AB (ref 13.0–17.0)
LYMPHS PCT: 5 %
Lymphs Abs: 0.2 10*3/uL — ABNORMAL LOW (ref 0.7–4.0)
MCH: 31.6 pg (ref 26.0–34.0)
MCHC: 33.7 g/dL (ref 30.0–36.0)
MCV: 93.7 fL (ref 78.0–100.0)
Monocytes Absolute: 0.3 10*3/uL (ref 0.1–1.0)
Monocytes Relative: 7 %
NEUTROS PCT: 87 %
Neutro Abs: 4.3 10*3/uL (ref 1.7–7.7)
Platelets: 244 10*3/uL (ref 150–400)
RBC: 2.85 MIL/uL — AB (ref 4.22–5.81)
RDW: 17.6 % — ABNORMAL HIGH (ref 11.5–15.5)
WBC: 4.9 10*3/uL (ref 4.0–10.5)

## 2017-10-12 MED ORDER — SODIUM CHLORIDE 0.9% FLUSH
10.0000 mL | INTRAVENOUS | Status: DC | PRN
  Administered 2017-10-12: 10 mL via INTRAVENOUS
  Filled 2017-10-12: qty 10

## 2017-10-12 MED ORDER — HEPARIN SOD (PORK) LOCK FLUSH 100 UNIT/ML IV SOLN
500.0000 [IU] | Freq: Once | INTRAVENOUS | Status: AC
Start: 2017-10-12 — End: 2017-10-12
  Administered 2017-10-12: 500 [IU] via INTRAVENOUS

## 2017-10-12 MED ORDER — SODIUM CHLORIDE 0.9 % IV SOLN
INTRAVENOUS | Status: DC
  Administered 2017-10-12: 12:00:00 via INTRAVENOUS

## 2017-10-12 MED ORDER — CYANOCOBALAMIN 1000 MCG/ML IJ SOLN
INTRAMUSCULAR | Status: AC
  Filled 2017-10-12: qty 1

## 2017-10-12 NOTE — Progress Notes (Signed)
Carl Murphy tolerated hydration with NS well without complaints or incident.Labs,including elevated BUN and Creatinine, and pt's complaints of increased fatigue and decrease in appetite reviewed with Dr. Talbert Cage. Orders obtained to hold chemo tx today and reschedule Day 8 for next week as well as hydrate with 1 Liter of NS over 2 hours today.Reviewed this information and the need to increase his fluid intake with the pt who verbalized understanding. VSS upon discharge. Pt discharged self ambulatory in satisfactory condition

## 2017-10-12 NOTE — Patient Instructions (Signed)
[place] at [facility] Discharge Instructions  RECOMMENDATIONS MADE BY THE CONSULTANT AND ANY TEST RESULTS WILL BE SENT TO YOUR REFERRING PHYSICIAN.  Chemotherapy treatment held today. Pt received 2 hours of hydration with NS. Follow-up as scheduled. Call clinic for any questions or concerns  Thank you for choosing [place] at [facility] to provide your oncology and hematology care.  To afford each patient quality time with our provider, please arrive at least 15 minutes before your scheduled appointment time.    If you have a lab appointment with the [place] please come in thru the  Main Entrance and check in at the main information desk  You need to re-schedule your appointment should you arrive 10 or more minutes late.  We strive to give you quality time with our providers, and arriving late affects you and other patients whose appointments are after yours.  Also, if you no show three or more times for appointments you may be dismissed from the clinic at the providers discretion.     Again, thank you for choosing [facility].  Our hope is that these requests will decrease the amount of time that you wait before being seen by our physicians.       _____________________________________________________________  Should you have questions after your visit to [facility], please contact our office at [phone] between the hours of 8:30 a.m. and 4:30 p.m.  Voicemails left after 4:30 p.m. will not be returned until the following business day.  For prescription refill requests, have your pharmacy contact our office.       Resources For Cancer Patients and their Caregivers ? American Cancer Society: Can assist with transportation, wigs, general needs, runs Look Good Feel Better.        (629)586-0459 ? Cancer Care: Provides financial assistance, online support groups, medication/co-pay assistance.   1-800-813-HOPE (763) 238-3148) ? [place] Assists [place] cancer patients and their families through emotional , educational and financial support.  587-311-4974 ? [place] DSS Where to apply for food stamps, Medicaid and utility assistance. 986-745-2336 ? [employer]: Transportation to medical appointments. (270) 257-7397 ? Social Security Administration: May apply for disability if have a Stage IV cancer. 218-257-4581 204-196-1888 ? [employer], Disability and Transit Services: Assists with nutrition, care and transit needs. [place] Support Programs: @10RELATIVEDAYS @ > Cancer Support Group  2nd Tuesday of the month 1pm-2pm, Journey Room  > Creative Journey  3rd Tuesday of the month 1130am-1pm, Journey Room  > Look Good Feel Better  1st Wednesday of the month 10am-12 noon, Journey Room (Call [place] to register [phone])

## 2017-10-19 ENCOUNTER — Encounter (HOSPITAL_BASED_OUTPATIENT_CLINIC_OR_DEPARTMENT_OTHER): Payer: Medicare Other

## 2017-10-19 ENCOUNTER — Encounter (HOSPITAL_COMMUNITY): Payer: Self-pay

## 2017-10-19 VITALS — BP 156/61 | HR 69 | Temp 97.5°F | Resp 20 | Wt 174.4 lb

## 2017-10-19 DIAGNOSIS — C670 Malignant neoplasm of trigone of bladder: Secondary | ICD-10-CM

## 2017-10-19 DIAGNOSIS — Z5111 Encounter for antineoplastic chemotherapy: Secondary | ICD-10-CM | POA: Diagnosis not present

## 2017-10-19 LAB — COMPREHENSIVE METABOLIC PANEL
ALBUMIN: 3.1 g/dL — AB (ref 3.5–5.0)
ALK PHOS: 54 U/L (ref 38–126)
ALT: 10 U/L — ABNORMAL LOW (ref 17–63)
AST: 23 U/L (ref 15–41)
Anion gap: 9 (ref 5–15)
BILIRUBIN TOTAL: 0.4 mg/dL (ref 0.3–1.2)
BUN: 27 mg/dL — ABNORMAL HIGH (ref 6–20)
CALCIUM: 8.4 mg/dL — AB (ref 8.9–10.3)
CO2: 22 mmol/L (ref 22–32)
CREATININE: 1.74 mg/dL — AB (ref 0.61–1.24)
Chloride: 101 mmol/L (ref 101–111)
GFR calc Af Amer: 44 mL/min — ABNORMAL LOW (ref >60)
GFR calc non Af Amer: 38 mL/min — ABNORMAL LOW (ref >60)
GLUCOSE: 115 mg/dL — AB (ref 65–99)
POTASSIUM: 4.3 mmol/L (ref 3.5–5.1)
Sodium: 132 mmol/L — ABNORMAL LOW (ref 135–145)
Total Protein: 6.4 g/dL — ABNORMAL LOW (ref 6.5–8.1)

## 2017-10-19 LAB — CBC WITH DIFFERENTIAL/PLATELET
BASOS PCT: 0 %
Basophils Absolute: 0 10*3/uL (ref 0.0–0.1)
EOS ABS: 0 10*3/uL (ref 0.0–0.7)
Eosinophils Relative: 0 %
HCT: 24.8 % — ABNORMAL LOW (ref 39.0–52.0)
Hemoglobin: 8 g/dL — ABNORMAL LOW (ref 13.0–17.0)
LYMPHS ABS: 0.8 10*3/uL (ref 0.7–4.0)
Lymphocytes Relative: 15 %
MCH: 31 pg (ref 26.0–34.0)
MCHC: 32.3 g/dL (ref 30.0–36.0)
MCV: 96.1 fL (ref 78.0–100.0)
MONO ABS: 0.5 10*3/uL (ref 0.1–1.0)
Monocytes Relative: 9 %
NEUTROS ABS: 4 10*3/uL (ref 1.7–7.7)
Neutrophils Relative %: 76 %
PLATELETS: 140 10*3/uL — AB (ref 150–400)
RBC: 2.58 MIL/uL — ABNORMAL LOW (ref 4.22–5.81)
RDW: 18.5 % — AB (ref 11.5–15.5)
WBC: 5.3 10*3/uL (ref 4.0–10.5)

## 2017-10-19 MED ORDER — HEPARIN SOD (PORK) LOCK FLUSH 100 UNIT/ML IV SOLN
500.0000 [IU] | Freq: Once | INTRAVENOUS | Status: AC | PRN
  Administered 2017-10-19: 500 [IU]

## 2017-10-19 MED ORDER — PROCHLORPERAZINE MALEATE 10 MG PO TABS
ORAL_TABLET | ORAL | Status: AC
  Filled 2017-10-19: qty 1

## 2017-10-19 MED ORDER — SODIUM CHLORIDE 0.9 % IV SOLN
Freq: Once | INTRAVENOUS | Status: AC
  Administered 2017-10-19: 11:00:00 via INTRAVENOUS

## 2017-10-19 MED ORDER — SODIUM CHLORIDE 0.9% FLUSH
10.0000 mL | INTRAVENOUS | Status: DC | PRN
  Administered 2017-10-19: 10 mL
  Filled 2017-10-19: qty 10

## 2017-10-19 MED ORDER — PROCHLORPERAZINE MALEATE 10 MG PO TABS
10.0000 mg | ORAL_TABLET | Freq: Once | ORAL | Status: AC
  Administered 2017-10-19: 10 mg via ORAL

## 2017-10-19 MED ORDER — GEMCITABINE HCL CHEMO INJECTION 1 GM/26.3ML
1000.0000 mg/m2 | Freq: Once | INTRAVENOUS | Status: AC
  Administered 2017-10-19: 1900 mg via INTRAVENOUS
  Filled 2017-10-19: qty 49.97

## 2017-10-19 NOTE — Patient Instructions (Signed)
[facility] Discharge Instructions for Patients Receiving Chemotherapy  Today you received the following chemotherapy agents gemzar today.    If you develop nausea and vomiting that is not controlled by your nausea medication, call the clinic.   BELOW ARE SYMPTOMS THAT SHOULD BE REPORTED IMMEDIATELY:  *FEVER GREATER THAN 100.5 F  *CHILLS WITH OR WITHOUT FEVER  NAUSEA AND VOMITING THAT IS NOT CONTROLLED WITH YOUR NAUSEA MEDICATION  *UNUSUAL SHORTNESS OF BREATH  *UNUSUAL BRUISING OR BLEEDING  TENDERNESS IN MOUTH AND THROAT WITH OR WITHOUT PRESENCE OF ULCERS  *URINARY PROBLEMS  *BOWEL PROBLEMS  UNUSUAL RASH Items with * indicate a potential emergency and should be followed up as soon as possible.  Feel free to call the clinic should you have any questions or concerns. The clinic phone number is [phone].  Please show the CHEMO ALERT CARD at check-in to the Emergency Department and triage nurse.   

## 2017-10-19 NOTE — Progress Notes (Signed)
Patient to treatment area for labs and chemotherapy.  Stated some fatigue but able to perform ADL's around the house.  No complaints of pain.    Reviewed labs with Dr. Talbert Cage and ok to treat today.  Reviewed hemoglobin with the patient and reviewed when to report to the emergency room for SOB or other symptoms.  Verbalized understanding.    Patient tolerated chemotherapy with no complaints voiced.  Port site clean and dry with no bruising or swelling noted at site.  Band aid applied.  VSS with discharge and left ambulatory with no s/s of distress noted.

## 2017-10-26 ENCOUNTER — Ambulatory Visit (HOSPITAL_COMMUNITY): Payer: Medicare Other

## 2017-10-26 ENCOUNTER — Ambulatory Visit (HOSPITAL_COMMUNITY): Payer: Medicare Other | Admitting: Oncology

## 2017-10-27 ENCOUNTER — Other Ambulatory Visit (HOSPITAL_COMMUNITY)
Admission: AD | Admit: 2017-10-27 | Discharge: 2017-10-27 | Disposition: A | Discharge status: Home / Self Care | Payer: Medicare Other | Source: Skilled Nursing Facility | Attending: Urology | Admitting: Urology

## 2017-10-27 ENCOUNTER — Ambulatory Visit: Payer: Medicare Other | Admitting: Urology

## 2017-10-27 DIAGNOSIS — C678 Malignant neoplasm of overlapping sites of bladder: Secondary | ICD-10-CM | POA: Diagnosis not present

## 2017-10-27 DIAGNOSIS — N390 Urinary tract infection, site not specified: Secondary | ICD-10-CM | POA: Insufficient documentation

## 2017-10-28 LAB — URINE CULTURE

## 2017-11-02 ENCOUNTER — Other Ambulatory Visit: Payer: Self-pay

## 2017-11-02 ENCOUNTER — Encounter (HOSPITAL_COMMUNITY): Payer: Medicare Other | Attending: Oncology

## 2017-11-02 ENCOUNTER — Other Ambulatory Visit (HOSPITAL_COMMUNITY): Payer: Self-pay | Admitting: Oncology

## 2017-11-02 ENCOUNTER — Encounter (HOSPITAL_BASED_OUTPATIENT_CLINIC_OR_DEPARTMENT_OTHER): Payer: Medicare Other | Admitting: Oncology

## 2017-11-02 ENCOUNTER — Encounter (HOSPITAL_COMMUNITY): Payer: Self-pay | Admitting: Oncology

## 2017-11-02 VITALS — BP 151/63 | HR 65 | Temp 97.3°F | Resp 20 | Wt 176.6 lb

## 2017-11-02 DIAGNOSIS — N189 Chronic kidney disease, unspecified: Secondary | ICD-10-CM

## 2017-11-02 DIAGNOSIS — D6481 Anemia due to antineoplastic chemotherapy: Secondary | ICD-10-CM

## 2017-11-02 DIAGNOSIS — C670 Malignant neoplasm of trigone of bladder: Secondary | ICD-10-CM | POA: Insufficient documentation

## 2017-11-02 DIAGNOSIS — Z5111 Encounter for antineoplastic chemotherapy: Secondary | ICD-10-CM

## 2017-11-02 DIAGNOSIS — C7800 Secondary malignant neoplasm of unspecified lung: Secondary | ICD-10-CM

## 2017-11-02 DIAGNOSIS — Z720 Tobacco use: Secondary | ICD-10-CM | POA: Diagnosis not present

## 2017-11-02 DIAGNOSIS — N1330 Unspecified hydronephrosis: Secondary | ICD-10-CM | POA: Diagnosis not present

## 2017-11-02 DIAGNOSIS — C679 Malignant neoplasm of bladder, unspecified: Secondary | ICD-10-CM

## 2017-11-02 DIAGNOSIS — T451X5A Adverse effect of antineoplastic and immunosuppressive drugs, initial encounter: Secondary | ICD-10-CM

## 2017-11-02 LAB — CBC WITH DIFFERENTIAL/PLATELET
Basophils Absolute: 0 10*3/uL (ref 0.0–0.1)
Basophils Relative: 0 %
EOS ABS: 0.1 10*3/uL (ref 0.0–0.7)
EOS PCT: 1 %
HCT: 25.1 % — ABNORMAL LOW (ref 39.0–52.0)
HEMOGLOBIN: 7.9 g/dL — AB (ref 13.0–17.0)
LYMPHS ABS: 1.4 10*3/uL (ref 0.7–4.0)
Lymphocytes Relative: 22 %
MCH: 31.5 pg (ref 26.0–34.0)
MCHC: 31.5 g/dL (ref 30.0–36.0)
MCV: 100 fL (ref 78.0–100.0)
MONO ABS: 0.6 10*3/uL (ref 0.1–1.0)
MONOS PCT: 9 %
Neutro Abs: 4.3 10*3/uL (ref 1.7–7.7)
Neutrophils Relative %: 68 %
PLATELETS: 283 10*3/uL (ref 150–400)
RBC: 2.51 MIL/uL — ABNORMAL LOW (ref 4.22–5.81)
RDW: 20.9 % — AB (ref 11.5–15.5)
WBC: 6.3 10*3/uL (ref 4.0–10.5)

## 2017-11-02 LAB — COMPREHENSIVE METABOLIC PANEL
ALK PHOS: 57 U/L (ref 38–126)
ALT: 10 U/L — AB (ref 17–63)
AST: 22 U/L (ref 15–41)
Albumin: 3.1 g/dL — ABNORMAL LOW (ref 3.5–5.0)
Anion gap: 8 (ref 5–15)
BUN: 24 mg/dL — AB (ref 6–20)
CALCIUM: 8.5 mg/dL — AB (ref 8.9–10.3)
CHLORIDE: 103 mmol/L (ref 101–111)
CO2: 24 mmol/L (ref 22–32)
CREATININE: 1.94 mg/dL — AB (ref 0.61–1.24)
GFR calc Af Amer: 39 mL/min — ABNORMAL LOW (ref >60)
GFR calc non Af Amer: 34 mL/min — ABNORMAL LOW (ref >60)
GLUCOSE: 90 mg/dL (ref 65–99)
Potassium: 4.3 mmol/L (ref 3.5–5.1)
SODIUM: 135 mmol/L (ref 135–145)
Total Bilirubin: 0.4 mg/dL (ref 0.3–1.2)
Total Protein: 6.6 g/dL (ref 6.5–8.1)

## 2017-11-02 LAB — PREPARE RBC (CROSSMATCH)

## 2017-11-02 MED ORDER — SODIUM CHLORIDE 0.9 % IV SOLN
318.5000 mg | Freq: Once | INTRAVENOUS | Status: DC
  Filled 2017-11-02: qty 32

## 2017-11-02 MED ORDER — SODIUM CHLORIDE 0.9 % IV SOLN
1000.0000 mg/m2 | Freq: Once | INTRAVENOUS | Status: AC
  Administered 2017-11-02: 1900 mg via INTRAVENOUS
  Filled 2017-11-02: qty 49.97

## 2017-11-02 MED ORDER — SODIUM CHLORIDE 0.9 % IV SOLN
Freq: Once | INTRAVENOUS | Status: AC
  Administered 2017-11-02: 12:00:00 via INTRAVENOUS

## 2017-11-02 MED ORDER — DEXAMETHASONE SODIUM PHOSPHATE 10 MG/ML IJ SOLN
10.0000 mg | Freq: Once | INTRAMUSCULAR | Status: AC
  Administered 2017-11-02: 10 mg via INTRAVENOUS

## 2017-11-02 MED ORDER — PALONOSETRON HCL INJECTION 0.25 MG/5ML
INTRAVENOUS | Status: AC
Start: 2017-11-02 — End: 2017-11-02
  Filled 2017-11-02: qty 5

## 2017-11-02 MED ORDER — DEXAMETHASONE SODIUM PHOSPHATE 10 MG/ML IJ SOLN
INTRAMUSCULAR | Status: AC
  Filled 2017-11-02: qty 1

## 2017-11-02 MED ORDER — HEPARIN SOD (PORK) LOCK FLUSH 100 UNIT/ML IV SOLN
500.0000 [IU] | Freq: Once | INTRAVENOUS | Status: AC | PRN
  Administered 2017-11-02: 500 [IU]

## 2017-11-02 MED ORDER — PALONOSETRON HCL INJECTION 0.25 MG/5ML
0.2500 mg | Freq: Once | INTRAVENOUS | Status: AC
  Administered 2017-11-02: 0.25 mg via INTRAVENOUS

## 2017-11-02 NOTE — Progress Notes (Signed)
[place]  PROGRESS NOTE  Patient Care Team: [name], MD as PCP - General (Internal Medicine)  CHIEF COMPLAINTS/PURPOSE OF CONSULTATION:   Oncology History   Per available records had bladder tumor removed but had extension into the muscularis propria. CT showed no extension beyond the bladder. Has seen 2 urologists in consultation. [place] about not having a cystectomy and ileal conduit.     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 No findings for local spread of disease or metastatic disease      03/25/2016 Surgery    TURBT 3cm right trigonal bladder tumor. followed by intravesical epirubicin instillation      03/25/2016 Pathology Results    bladder TURBT infiltrative high grade papillary urothelial carcinoma, invades the mulcularis propria (detrusor) no LVI       04/14/2016 Miscellaneous    [name] consultation in [place] for consideration of cystectomy. patient also offered repeat resection with BCG, and CHEMO/XRT      06/17/2016 Pathology Results    TURB- high grade papillary urothelial carcinoma with focal stromal invasion       Radiation Therapy    Planned to be completed on 09/10/2016.      07/22/2016 -  Chemotherapy    The patient had palonosetron (ALOXI) injection 0.25 mg, 0.25 mg, Intravenous,  Once, 2 of 5 cycles Administration: 0.25 mg (07/28/2016)  CARBOplatin (PARAPLATIN) 160 mg in sodium chloride 0.9 % 100 mL chemo infusion, 160 mg (100 % of original dose 160.6 mg), Intravenous,  Once, 2 of 5 cycles Dose modification:   (original dose 160.6 mg, Cycle 1),   (original dose 160.6 mg, Cycle 1),   (original dose 160.6 mg, Cycle 1),   (original dose 160.6 mg, Cycle 2) Administration: 180 mg (07/28/2016)  for chemotherapy treatment.        04/07/2017 Relapse/Recurrence    CT Abd/pelvis: IMPRESSION: 1. Recurrent bladder carcinoma with  synchronous disease in the distal right ureter causing severe right hydronephrosis and decreased right renal function. 2. Bilateral renal stones. 3. Aortic atherosclerosis (ICD10-170.0). Coronary artery calcification. 4. Small periumbilical hernia contains a knuckle of unobstructed small bowel.       06/10/2017 PET scan    1. Metachronous urothelial carcinoma within the distal right ureter, as on prior CT. 2. No evidence of nodal metastasis. 3. Bilateral hypermetabolic pulmonary nodules, favoring pulmonary metastasis. Especially given emphysema, 1 or more primary bronchogenic carcinomas cannot be excluded but are felt less likely. 4. Coronary artery atherosclerosis. Aortic Atherosclerosis (ICD10-I70.0). 5. Bladder not well evaluated secondary to hypermetabolic urine within.       06/18/2017 Procedure    Placement of single lumen port a cath via right internal jugular vein. The catheter tip lies at the cavo-atrial junction. A power injectable port a cath was placed and is ready for immediate use.      06/29/2017 -  Chemotherapy    Carboplatin/Gemcitabine every 21 days       08/14/2017 Imaging    CT C/A/P IMPRESSION: 1. Overall stable pulmonary nodules. Knee soft tissue rim around the cavitary lesion in the left upper lobe is slightly thinner. No new pulmonary nodules or progressive findings in the chest. 2. Stable advanced emphysematous changes and pulmonary scarring. 3. No mediastinal or hilar mass or lymphadenopathy. 4. Improved CT appearance of the bladder. The asymmetric areas of bladder wall enhancement have improved. There is  still diffuse irregular bladder wall thickening and distal right ureteral tumor appears stable. Stable right-sided hydroureteronephrosis. 5. Stable cirrhotic changes involving the liver and stable liver cysts. 6. Stable scattered mesenteric and retroperitoneal and pelvic lymph nodes but no mass or overt adenopathy. 7. Stable severe/advanced  atherosclerotic calcifications involving the thoracic and abdominal aorta and branch vessels.      COPD Hypertensive cardiomyopathy  HISTORY OF PRESENTING ILLNESS:  Carl Murphy 71 y.o. male is here for ongoing follow-up of muscle invasive carcinoma of the bladder. He completed concurrent carbo/XRT. Patient has refused cystectomy.  Patient was hospitalized at [facility] from 05/07/17 through 05/12/17 for hematuria. Patient was placed on continuous bladder irrigation for persistent severe hematuria for which she was given 5 units PRBC transfusion. He was seen by his urologist who taken to the OR on 05/11/17 for cystoscopy, clot evaluation, TURBT 7 cm recurrent bladder tumor and placement of a right double J stent was attempted. Final pathology demonstrated a muscle invasive disease. His hemoglobin at the time of discharge was 8.9 g/dL. Patient underwent CT abdomen and pelvis with and without contrast on 04/07/17 which demonstrated recurrent bladder carcinoma with synchronous disease in the distal right ureter causing severe right hydronephrosis and decreased right renal function. Dr. Diona Fanti had recommended for patient to undergo a right ureteral stent for his right hydronephrosis by IR, but patient has refused so far. He was seen by Dr. Alen Blew, oncology at [facility], who recommended for patient to get a PET scans rule out metastatic disease and systemic chemotherapy for palliative treatment of his recurrent bladder cancer. Patient underwent a PET scan on 06/10/17 which demonstrated metasynchronous urothelial carcinoma within the distal right ureter, no evidence of nodal metastasis, bilateral hypermetabolic pulmonary nodules favoring pulmonary metastasis.  November 02, 2017 Patient is here for ongoing evaluation and treatment consideration.  Patient had developed intense shortness of breath approximately half an hour after last chemotherapy lasting -hour and half. Patient did not come and see any  doctor.  No chest pain.  Appetite has been stable.  No nausea no vomiting or diarrhea. Patient is here for his 6 cycle of chemotherapy with carboplatinum and gemcitabine. No hematuria. MEDICAL HISTORY:  Past Medical History:  Diagnosis Date  . Arthritis   . Cancer [facility])    Bladder Cancer  . CHF (congestive heart failure) ([place])   . COPD (chronic obstructive pulmonary disease) ([place])   . Depression   . Hard of hearing    bilat   . Hypertension   . Malignant neoplasm of trigone of bladder ([place]) 03/25/2016  . PONV (postoperative nausea and vomiting)   . Shortness of breath dyspnea    heat; walking up set of stairs; pt states can walk up 12-14 stairs slowly without having to stop to catch his breath    SURGICAL HISTORY: Past Surgical History:  Procedure Laterality Date  . CYSTOSCOPY N/A 06/16/2016   Procedure: CYSTOSCOPY;  Surgeon: Franchot Gallo, MD;  Location: [facility];  Service: Urology;  Laterality: N/A;  . IR FLUORO GUIDE PORT INSERTION RIGHT  06/18/2017  . IR US GUIDE VASC ACCESS RIGHT  06/18/2017  . TRANSURETHRAL RESECTION OF BLADDER TUMOR N/A 03/25/2016   Procedure: TRANSURETHRAL RESECTION OF BLADDER TUMOR (TURBT);  Surgeon: Franchot Gallo, MD;  Location: [facility];  Service: Urology;  Laterality: N/A;  . TRANSURETHRAL RESECTION OF BLADDER TUMOR N/A 06/16/2016   Procedure: TRANSURETHRAL RESECTION OF BLADDER TUMOR (TURBT);  Surgeon: Franchot Gallo, MD;  Location: [facility];  Service: Urology;  Laterality: N/A;  . TRANSURETHRAL RESECTION OF BLADDER TUMOR N/A 12/16/2016   Procedure: TRANSURETHRAL RESECTION OF BLADDER TUMOR (TURBT);  Surgeon: Franchot Gallo, MD;  Location: [facility];  Service: Urology;  Laterality: N/A;  . TRANSURETHRAL RESECTION OF BLADDER TUMOR N/A 05/11/2017   Procedure: TRANSURETHRAL RESECTION OF BLADDER TUMOR (TURBT) CLOT EVACUATION;  Surgeon: Franchot Gallo, MD;  Location: [facility];  Service: Urology;  Laterality: N/A;  1 HR [phone] BLUE  MEDICARE-[id number]    SOCIAL HISTORY: Social History   Socioeconomic History  . Marital status: Married    Spouse name: Not on file  . Number of children: Not on file  . Years of education: Not on file  . Highest education level: Not on file  Social Needs  . Financial resource strain: Not on file  . Food insecurity - worry: Not on file  . Food insecurity - inability: Not on file  . Transportation needs - medical: Not on file  . Transportation needs - non-medical: Not on file  Occupational History  . Not on file  Tobacco Use  . Smoking status: Current Every Day Smoker    Packs/day: 2.00    Years: 50.00    Pack years: 100.00    Types: Cigarettes  . Smokeless tobacco: Never Used  Substance and Sexual Activity  . Alcohol use: No    Comment: occasionally  . Drug use: No  . Sexual activity: No    Birth control/protection: None  Other Topics Concern  . Not on file  Social History Narrative  . 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 Hypertension Mother   . Asthma Father   . Emphysema Father     Both parents deceased Mom was early 41's when she died; he was around 80-something Dad had asthma and emphysema really bad; thinks his heart finally gave out He says his mother's "intestines broke" and it was "fast forward downhill from there."  He is an only child.   ALLERGIES:  has No Known Allergies.  MEDICATIONS:  Current Outpatient Medications  Medication Sig Dispense Refill   . CARBOPLATIN IV Inject into the vein. Day 1 every 21 days    . carvedilol (COREG) 25 MG tablet Take 25 mg by mouth 2 (two) times daily.   11  . citalopram (CELEXA) 20 MG tablet Take 20 mg by mouth at bedtime.   11  . dexamethasone (DECADRON) 4 MG tablet Take 2 tablets (8 mg total) by mouth daily. Start the day after carboplatin chemotherapy for 2 days. 30 tablet 1  . Diphenhyd-Hydrocort-Nystatin (FIRST-DUKES MOUTHWASH) SUSP Use as directed 5 mLs in the mouth or throat 4 (four) times daily -  before meals and at bedtime. 480 mL 1  . fluticasone (FLONASE) 50 MCG/ACT nasal spray Place 2 sprays into both nostrils daily as needed for allergies or rhinitis.    . Gemcitabine HCl (GEMZAR IV) Inject into the vein. Day 1, day 8 every 21 days    . lidocaine (  XYLOCAINE) 2 % solution Swish and swallow 4ml three to four times a day as needed for throat pain. 480 mL 2  . lidocaine-prilocaine (EMLA) cream Apply to affected area once 30 g 3  . ondansetron (ZOFRAN) 8 MG tablet Take 1 tablet (8 mg total) by mouth 2 (two) times daily as needed for refractory nausea / vomiting. Start on day 3 after carboplatin chemo. 30 tablet 1  . oxybutynin (DITROPAN) 5 MG tablet Take 1 tablet (5 mg total) by mouth every 6 (six) hours as needed for bladder spasms. 20 tablet 0  . prochlorperazine (COMPAZINE) 10 MG tablet Take 1 tablet (10 mg total) by mouth every 6 (six) hours as needed (Nausea or vomiting). 30 tablet 1   No current facility-administered medications for this visit.    Facility-Administered Medications Ordered in Other Visits  Medication Dose Route Frequency Provider Last Rate Last Dose  . 0.9 %  sodium chloride infusion   Intravenous Once Twana First, MD      . CARBOplatin (PARAPLATIN) 320 mg in sodium chloride 0.9 % 250 mL chemo infusion  320 mg Intravenous Once Twana First, MD      . dexamethasone (DECADRON) injection 10 mg  10 mg Intravenous Once Twana First, MD      . gemcitabine (GEMZAR) 1,900 mg in  sodium chloride 0.9 % 250 mL chemo infusion  1,000 mg/m2 (Order-Specific) Intravenous Once Twana First, MD      . heparin lock flush 100 unit/mL  500 Units Intracatheter Once PRN Twana First, MD      . palonosetron (ALOXI) injection 0.25 mg  0.25 mg Intravenous Once Twana First, MD       Review of Systems  Constitutional: Positive for malaise/fatigue. Negative for chills and fever.  HENT: Negative.   Eyes: Negative.   Respiratory: Negative for cough and shortness of breath.   Cardiovascular: Negative.  Negative for chest pain.  Gastrointestinal: Negative for constipation and diarrhea.  Genitourinary: Positive for frequency. Negative for dysuria and hematuria.       Blood clots in urine for 3 days last week  Musculoskeletal: Negative.   Skin: Negative.   Neurological: Negative.   Endo/Heme/Allergies: Negative.   Psychiatric/Behavioral: Negative.   All other systems reviewed and are negative. 14 point ROS was done and is otherwise as detailed above or in HPI    PHYSICAL EXAMINATION: ECOG PERFORMANCE STATUS: 1 - Symptomatic but completely ambulatory  Physical Exam  Constitutional: He is oriented to person, place, and time and well-developed, well-nourished, and in no distress. No distress.  HENT:  Head: Normocephalic and atraumatic.  Mouth/Throat: Oropharynx is clear and moist. No oropharyngeal exudate.  Eyes: Conjunctivae and EOM are normal. Pupils are equal, round, and reactive to light. Right eye exhibits no discharge. Left eye exhibits no discharge. No scleral icterus.  Neck: Normal range of motion. Neck supple. No tracheal deviation present. No thyromegaly present.  Cardiovascular: Normal rate, regular rhythm and normal heart sounds.  No murmur heard. Pulmonary/Chest: Effort normal and breath sounds normal. No respiratory distress. He has no wheezes.  Abdominal: Soft. Bowel sounds are normal. He exhibits no distension and no mass. There is no tenderness. There is no rebound  and no guarding.  Umbilical hernia  Musculoskeletal: Normal range of motion. He exhibits no edema or tenderness.  Lymphadenopathy:    He has no cervical adenopathy.  Neurological: He is alert and oriented to person, place, and time. No cranial nerve deficit. Gait normal. Coordination normal.  Skin: Skin is  warm and dry. No rash noted. He is not diaphoretic. No erythema.  Psychiatric: Mood, memory, affect and judgment normal.  Nursing note and vitals reviewed.   LABORATORY DATA:  I have reviewed the data as listed Lab Results  Component Value Date   WBC 6.3 11/02/2017   HGB 7.9 (L) 11/02/2017   HCT 25.1 (L) 11/02/2017   MCV 100.0 11/02/2017   PLT 283 11/02/2017   CMP     Component Value Date/Time   NA 135 11/02/2017 0952   K 4.3 11/02/2017 0952   CL 103 11/02/2017 0952   CO2 24 11/02/2017 0952   GLUCOSE 90 11/02/2017 0952   BUN 24 (H) 11/02/2017 0952   CREATININE 1.94 (H) 11/02/2017 0952   CALCIUM 8.5 (L) 11/02/2017 0952   PROT 6.6 11/02/2017 0952   ALBUMIN 3.1 (L) 11/02/2017 0952   AST 22 11/02/2017 0952   ALT 10 (L) 11/02/2017 0952   ALKPHOS 57 11/02/2017 0952   BILITOT 0.4 11/02/2017 0952   GFRNONAA 34 (L) 11/02/2017 0952   GFRAA 39 (L) 11/02/2017 3267     RADIOGRAPHIC STUDIES: I have personally reviewed the radiological images as listed and agreed with the findings in the report. CLINICAL DATA: Hematuria and frequent urination.  EXAM: CT ABDOMEN AND PELVIS WITHOUT AND WITH CONTRAST  TECHNIQUE: Multidetector CT imaging of the abdomen and pelvis was performed following the standard protocol before and following the bolus administration of intravenous contrast.  CONTRAST: 192mL OMNIPAQUE IOHEXOL 300 MG/ML SOLN  COMPARISON: CT scan 03/31/2013  FINDINGS: Lower chest: The lung bases are clear of acute process. No worrisome pulmonary lesions. No pleural effusion. The heart is normal in size. No pericardial effusion. Coronary artery calcifications are  noted. Dense distal aortic calcifications are noted. The esophagus is grossly normal.  Hepatobiliary: Small low-attenuation liver lesions are stable and likely small cysts. Lesion and segment 8 is also stable and likely a hemangioma. No worrisome hepatic lesions or intrahepatic biliary dilatation. The gallbladder is normal. No common bile duct dilatation.  Pancreas: No mass, inflammation or ductal dilatation.  Spleen: Normal size. No focal lesions.  Adrenals/Urinary Tract: The adrenal glands are normal and stable.  Numerous left renal artery calcifications. I do not see any definite calculi in the left collecting system. Small left renal cysts are noted. The no right renal calculi or hydronephrosis. Both ureters are normal. No ureteral calculi. No significant collecting system abnormality on the delayed images.  There is a large irregular bladder mass at on the right side. This demonstrates moderate contrast enhancement. It measures 3.2 x 3.0 cm. No ureteral obstruction.  Stomach/Bowel: The stomach, duodenum, small bowel and colon are unremarkable without oral contrast. No inflammatory changes, mass lesions or obstructive findings. The terminal ileum is normal. The appendix is normal. Moderate sigmoid diverticulosis without findings for acute diverticulitis.  Vascular/Lymphatic: Advanced aortic and branch vessel atherosclerotic calcifications. No focal aneurysm or dissection. The major venous structures are patent.  No mesenteric or retroperitoneal mass or lymphadenopathy. Small scattered lymph nodes are noted. Small bilateral external iliac artery lymph nodes are stable. No pelvic lymphadenopathy. No inguinal adenopathy. The prostate gland and seminal vesicles are grossly normal. New line small  Small Periumbilical abdominal wall hernia containing fat and a small bowel loop. This has enlarged since prior CT scan.  Other: No subcutaneous lesions or  ascites.  Musculoskeletal: No significant bony findings.  IMPRESSION: 1. 3.2 x 3.0 cm bladder mass consistent with transitional cell carcinoma. No findings for local spread of disease  or metastatic disease. 2. No worrisome renal or ureteral lesions. 3. Small benign-appearing and stable hepatic lesions. 4. Advanced atherosclerotic calcifications involving the aorta and branch vessels. 5. Small periumbilical abdominal wall hernia containing fat and a small bowel loop.   Electronically Signed  By: Marijo Sanes M.D.  On: 02/18/2016 16:47   ASSESSMENT & PLAN:  Stage II (T2N0M0) Muscle invasive carcinoma of the trigone of the bladder, recurrent disease in 03/2017 with recurrent bladder tumor and new pulm mets. He now has Stage IV disease. Severe right hydronephrosis due to recurrent bladder carcinoma with synchronous disease in the distal right ureter  Tobacco Abuse CKD COPD Anemia secondary to chemotherapy  November 02, 2017 Patient had intense shortness of breath after last chemotherapy most likely secondary to carboplatinum reaction so we will hold carboplatinum at this point in time Patient was advised to go to the emergency room if he develops any further shortness of breath to be sure that we are not dealing with any other causes of his shortness of breath like pulmonary embolism Will get gemcitabine intravenous today Repeat PET scan and reevaluate patient for possibility of anti-PDL drug the patient still has a persistent disease The patient is responded very well then will continue surveillance. We will check INR and binding capacity next appointment to rule out any associated iron deficiency anemia.     Forest Gleason, MD  11/02/2017 11:45 AM

## 2017-11-02 NOTE — Patient Instructions (Signed)
[place] at [facility] Discharge Instructions  RECOMMENDATIONS MADE BY THE CONSULTANT AND ANY TEST RESULTS WILL BE SENT TO YOUR REFERRING PHYSICIAN.  You were seen today by Dr. Forest Gleason You will get treatment today Follow up as scheduled.  Thank you for choosing [place] at [facility] to provide your oncology and hematology care.  To afford each patient quality time with our provider, please arrive at least 15 minutes before your scheduled appointment time.    If you have a lab appointment with the [facility] please come in thru the  Main Entrance and check in at the main information desk  You need to re-schedule your appointment should you arrive 10 or more minutes late.  We strive to give you quality time with our providers, and arriving late affects you and other patients whose appointments are after yours.  Also, if you no show three or more times for appointments you may be dismissed from the clinic at the providers discretion.     Again, thank you for choosing [facility].  Our hope is that these requests will decrease the amount of time that you wait before being seen by our physicians.       _____________________________________________________________  Should you have questions after your visit to [facility], please contact our office at [phone] between the hours of 8:30 a.m. and 4:30 p.m.  Voicemails left after 4:30 p.m. will not be returned until the following business day.  For prescription refill requests, have your pharmacy contact our office.       Resources For Cancer Patients and their Caregivers ? American Cancer Society: Can assist with transportation, wigs, general needs, runs Look Good Feel Better.        651 300 9610 ? Cancer Care: Provides financial assistance, online support groups, medication/co-pay assistance.  1-800-813-HOPE (616)599-2001) ? [facility] Assists [place] cancer patients and their families through emotional , educational and financial support.  (706)614-9384 ? [place] DSS Where to apply for food stamps, Medicaid and utility assistance. (708)503-5672 ? RCATS: Transportation to medical appointments. (548)448-6566 ? Social Security Administration: May apply for disability if have a Stage IV cancer. 425-088-2118 251-367-6330 ? [employer], Disability and Transit Services: Assists with nutrition, care and transit needs. [facility] Support Programs: @10RELATIVEDAYS @ > Cancer Support Group  2nd Tuesday of the month 1pm-2pm, Journey Room  > Creative Journey  3rd Tuesday of the month 1130am-1pm, Journey Room  > Look Good Feel Better  1st Wednesday of the month 10am-12 noon, Journey Room (Call [place] to register [phone])

## 2017-11-02 NOTE — Progress Notes (Signed)
Labs reviewed with Dr. Talbert Cage - specifically hgb and creatinine - okay to tx today per MD.  Orders rec'd to transfuse 2 units PRBC; pt will return Wednesday, 11/04/17, to receive blood.   Per Dr. Oliva Bustard, will hold carboplatin today d/t an episode of severe shortness of breath after last tx - MD thinks it may have been a carboplatin-related reaction.  Will receive Gemzar only today.   Tolerated infusion w/o adverse reaction.  Alert, in no distress.  VSS.  Discharged ambulatory.

## 2017-11-03 ENCOUNTER — Other Ambulatory Visit (HOSPITAL_COMMUNITY)
Admission: AD | Admit: 2017-11-03 | Discharge: 2017-11-03 | Disposition: A | Discharge status: Home / Self Care | Payer: Medicare Other | Source: Skilled Nursing Facility | Attending: Urology | Admitting: Urology

## 2017-11-03 ENCOUNTER — Ambulatory Visit: Payer: Medicare Other | Admitting: Urology

## 2017-11-03 DIAGNOSIS — C678 Malignant neoplasm of overlapping sites of bladder: Secondary | ICD-10-CM | POA: Diagnosis present

## 2017-11-04 ENCOUNTER — Encounter (HOSPITAL_COMMUNITY): Payer: Medicare Other | Attending: Oncology

## 2017-11-04 ENCOUNTER — Encounter (HOSPITAL_COMMUNITY): Payer: Self-pay

## 2017-11-04 DIAGNOSIS — Z79891 Long term (current) use of opiate analgesic: Secondary | ICD-10-CM | POA: Insufficient documentation

## 2017-11-04 DIAGNOSIS — D6481 Anemia due to antineoplastic chemotherapy: Secondary | ICD-10-CM | POA: Diagnosis not present

## 2017-11-04 DIAGNOSIS — Z79899 Other long term (current) drug therapy: Secondary | ICD-10-CM | POA: Insufficient documentation

## 2017-11-04 DIAGNOSIS — C670 Malignant neoplasm of trigone of bladder: Secondary | ICD-10-CM | POA: Diagnosis not present

## 2017-11-04 DIAGNOSIS — I2510 Atherosclerotic heart disease of native coronary artery without angina pectoris: Secondary | ICD-10-CM | POA: Insufficient documentation

## 2017-11-04 DIAGNOSIS — K439 Ventral hernia without obstruction or gangrene: Secondary | ICD-10-CM | POA: Insufficient documentation

## 2017-11-04 DIAGNOSIS — I509 Heart failure, unspecified: Secondary | ICD-10-CM | POA: Insufficient documentation

## 2017-11-04 DIAGNOSIS — Z8249 Family history of ischemic heart disease and other diseases of the circulatory system: Secondary | ICD-10-CM | POA: Insufficient documentation

## 2017-11-04 DIAGNOSIS — K7689 Other specified diseases of liver: Secondary | ICD-10-CM | POA: Insufficient documentation

## 2017-11-04 DIAGNOSIS — I110 Hypertensive heart disease with heart failure: Secondary | ICD-10-CM | POA: Insufficient documentation

## 2017-11-04 DIAGNOSIS — I43 Cardiomyopathy in diseases classified elsewhere: Secondary | ICD-10-CM | POA: Insufficient documentation

## 2017-11-04 DIAGNOSIS — F329 Major depressive disorder, single episode, unspecified: Secondary | ICD-10-CM | POA: Insufficient documentation

## 2017-11-04 DIAGNOSIS — J449 Chronic obstructive pulmonary disease, unspecified: Secondary | ICD-10-CM | POA: Insufficient documentation

## 2017-11-04 DIAGNOSIS — F17210 Nicotine dependence, cigarettes, uncomplicated: Secondary | ICD-10-CM | POA: Insufficient documentation

## 2017-11-04 DIAGNOSIS — N132 Hydronephrosis with renal and ureteral calculous obstruction: Secondary | ICD-10-CM | POA: Insufficient documentation

## 2017-11-04 DIAGNOSIS — I700 Atherosclerosis of aorta: Secondary | ICD-10-CM | POA: Insufficient documentation

## 2017-11-04 DIAGNOSIS — C661 Malignant neoplasm of right ureter: Secondary | ICD-10-CM | POA: Insufficient documentation

## 2017-11-04 DIAGNOSIS — K429 Umbilical hernia without obstruction or gangrene: Secondary | ICD-10-CM | POA: Insufficient documentation

## 2017-11-04 DIAGNOSIS — C7800 Secondary malignant neoplasm of unspecified lung: Secondary | ICD-10-CM | POA: Insufficient documentation

## 2017-11-04 DIAGNOSIS — Z9889 Other specified postprocedural states: Secondary | ICD-10-CM | POA: Insufficient documentation

## 2017-11-04 DIAGNOSIS — Z825 Family history of asthma and other chronic lower respiratory diseases: Secondary | ICD-10-CM | POA: Insufficient documentation

## 2017-11-04 DIAGNOSIS — T451X5A Adverse effect of antineoplastic and immunosuppressive drugs, initial encounter: Secondary | ICD-10-CM

## 2017-11-04 MED ORDER — SODIUM CHLORIDE 0.9 % IV SOLN
250.0000 mL | Freq: Once | INTRAVENOUS | Status: AC
  Administered 2017-11-04: 250 mL via INTRAVENOUS

## 2017-11-04 MED ORDER — SODIUM CHLORIDE 0.9% FLUSH
10.0000 mL | INTRAVENOUS | Status: AC | PRN
  Administered 2017-11-04: 10 mL

## 2017-11-04 MED ORDER — HEPARIN SOD (PORK) LOCK FLUSH 100 UNIT/ML IV SOLN
500.0000 [IU] | Freq: Every day | INTRAVENOUS | Status: AC | PRN
  Administered 2017-11-04: 500 [IU]

## 2017-11-04 MED ORDER — ACETAMINOPHEN 325 MG PO TABS
650.0000 mg | ORAL_TABLET | Freq: Once | ORAL | Status: AC
  Administered 2017-11-04: 650 mg via ORAL
  Filled 2017-11-04: qty 2

## 2017-11-04 MED ORDER — DIPHENHYDRAMINE HCL 25 MG PO CAPS
25.0000 mg | ORAL_CAPSULE | Freq: Once | ORAL | Status: AC
  Administered 2017-11-04: 25 mg via ORAL
  Filled 2017-11-04: qty 1

## 2017-11-04 NOTE — Progress Notes (Signed)
To unit for blood transfusions today.  No worsening of SOB.  Patient stated he felt "fine" with no complaints of pain voiced.   Patient tolerated blood transfusions with no complaints voiced.  Port site clean and dry with no bruising or swelling noted at site.  Band aid applied.  VSS with discharge and left ambulatory with no s/s of distress noted.  Instructed when to call or report to the emergency room after blood transfusion with understanding verbalized.

## 2017-11-04 NOTE — Patient Instructions (Signed)
[facility] at [facility]  Discharge Instructions:  You received 2 units of blood today.  _______________________________________________________________  Thank you for choosing [facility] at [facility] to provide your oncology and hematology care.  To afford each patient quality time with our providers, please arrive at least 15 minutes before your scheduled appointment.  You need to re-schedule your appointment if you arrive 10 or more minutes late.  We strive to give you quality time with our providers, and arriving late affects you and other patients whose appointments are after yours.  Also, if you no show three or more times for appointments you may be dismissed from the clinic.  Again, thank you for choosing [facility] at [facility]. Our hope is that these requests will allow you access to exceptional care and in a timely manner. _______________________________________________________________  If you have questions after your visit, please contact our office at [phone] between the hours of 8:30 a.m. and 5:00 p.m. Voicemails left after 4:30 p.m. will not be returned until the following business day. _______________________________________________________________  For prescription refill requests, have your pharmacy contact our office. _______________________________________________________________  Recommendations made by the consultant and any test results will be sent to your referring physician. _______________________________________________________________ 

## 2017-11-05 LAB — TYPE AND SCREEN
ABO/RH(D): O POS
ANTIBODY SCREEN: NEGATIVE
Unit division: 0
Unit division: 0

## 2017-11-05 LAB — BPAM RBC
Blood Product Expiration Date: 201812282359
Blood Product Expiration Date: 201812292359
ISSUE DATE / TIME: 201812051210
ISSUE DATE / TIME: 201812051015
UNIT TYPE AND RH: 5100
UNIT TYPE AND RH: 5100

## 2017-11-05 LAB — URINE CULTURE

## 2017-11-09 ENCOUNTER — Ambulatory Visit (HOSPITAL_COMMUNITY): Payer: Medicare Other

## 2017-11-10 ENCOUNTER — Ambulatory Visit (HOSPITAL_COMMUNITY): Payer: Medicare Other

## 2017-11-12 ENCOUNTER — Encounter (HOSPITAL_BASED_OUTPATIENT_CLINIC_OR_DEPARTMENT_OTHER): Payer: Medicare Other

## 2017-11-12 ENCOUNTER — Encounter (HOSPITAL_COMMUNITY): Payer: Self-pay

## 2017-11-12 VITALS — BP 157/64 | HR 63 | Temp 97.7°F | Resp 18 | Wt 178.4 lb

## 2017-11-12 DIAGNOSIS — Z5111 Encounter for antineoplastic chemotherapy: Secondary | ICD-10-CM | POA: Diagnosis not present

## 2017-11-12 DIAGNOSIS — C670 Malignant neoplasm of trigone of bladder: Secondary | ICD-10-CM

## 2017-11-12 LAB — COMPREHENSIVE METABOLIC PANEL
ALK PHOS: 60 U/L (ref 38–126)
ALT: 10 U/L — ABNORMAL LOW (ref 17–63)
AST: 17 U/L (ref 15–41)
Albumin: 3.1 g/dL — ABNORMAL LOW (ref 3.5–5.0)
Anion gap: 8 (ref 5–15)
BILIRUBIN TOTAL: 0.5 mg/dL (ref 0.3–1.2)
BUN: 32 mg/dL — ABNORMAL HIGH (ref 6–20)
CALCIUM: 8.6 mg/dL — AB (ref 8.9–10.3)
CO2: 25 mmol/L (ref 22–32)
Chloride: 103 mmol/L (ref 101–111)
Creatinine, Ser: 2.09 mg/dL — ABNORMAL HIGH (ref 0.61–1.24)
GFR calc non Af Amer: 31 mL/min — ABNORMAL LOW (ref >60)
GFR, EST AFRICAN AMERICAN: 36 mL/min — AB (ref >60)
Glucose, Bld: 86 mg/dL (ref 65–99)
POTASSIUM: 4.7 mmol/L (ref 3.5–5.1)
Sodium: 136 mmol/L (ref 135–145)
TOTAL PROTEIN: 6.6 g/dL (ref 6.5–8.1)

## 2017-11-12 LAB — CBC WITH DIFFERENTIAL/PLATELET
BASOS ABS: 0 10*3/uL (ref 0.0–0.1)
BASOS PCT: 0 %
Eosinophils Absolute: 0.1 10*3/uL (ref 0.0–0.7)
Eosinophils Relative: 1 %
HEMATOCRIT: 30.8 % — AB (ref 39.0–52.0)
HEMOGLOBIN: 10 g/dL — AB (ref 13.0–17.0)
Lymphocytes Relative: 19 %
Lymphs Abs: 1.3 10*3/uL (ref 0.7–4.0)
MCH: 31.5 pg (ref 26.0–34.0)
MCHC: 32.5 g/dL (ref 30.0–36.0)
MCV: 97.2 fL (ref 78.0–100.0)
MONO ABS: 0.3 10*3/uL (ref 0.1–1.0)
Monocytes Relative: 4 %
NEUTROS ABS: 5 10*3/uL (ref 1.7–7.7)
NEUTROS PCT: 76 %
Platelets: 189 10*3/uL (ref 150–400)
RBC: 3.17 MIL/uL — AB (ref 4.22–5.81)
RDW: 19.3 % — AB (ref 11.5–15.5)
WBC: 6.6 10*3/uL (ref 4.0–10.5)

## 2017-11-12 MED ORDER — SODIUM CHLORIDE 0.9 % IV SOLN
Freq: Once | INTRAVENOUS | Status: AC
Start: 2017-11-12 — End: 2017-11-12
  Administered 2017-11-12: 13:00:00 via INTRAVENOUS

## 2017-11-12 MED ORDER — HEPARIN SOD (PORK) LOCK FLUSH 100 UNIT/ML IV SOLN
500.0000 [IU] | Freq: Once | INTRAVENOUS | Status: AC | PRN
  Administered 2017-11-12: 500 [IU]
  Filled 2017-11-12: qty 5

## 2017-11-12 MED ORDER — SODIUM CHLORIDE 0.9 % IV SOLN
1000.0000 mg/m2 | Freq: Once | INTRAVENOUS | Status: AC
  Administered 2017-11-12: 1900 mg via INTRAVENOUS
  Filled 2017-11-12: qty 49.97

## 2017-11-12 MED ORDER — PROCHLORPERAZINE MALEATE 10 MG PO TABS
10.0000 mg | ORAL_TABLET | Freq: Once | ORAL | Status: AC
  Administered 2017-11-12: 10 mg via ORAL
  Filled 2017-11-12: qty 1

## 2017-11-12 MED ORDER — SODIUM CHLORIDE 0.9% FLUSH
10.0000 mL | INTRAVENOUS | Status: DC | PRN
  Administered 2017-11-12: 10 mL
  Filled 2017-11-12: qty 10

## 2017-11-12 NOTE — Progress Notes (Signed)
[place] reviewed with Dr. Talbert Cage today and pt approved for chemotherapy tx                                      Carl Murphy tolerated Gemzar infusion well without complaints or incident. VSS upon discharge. Pt discharged self ambulatory in satisfactory condition

## 2017-11-12 NOTE — Patient Instructions (Signed)
[facility] Discharge Instructions for Patients Receiving Chemotherapy   Beginning January 23rd 2017 lab work for the Cancer Center will be done in the  Main lab at [facility] on 1st floor. If you have a lab appointment with the Cancer Center please come in thru the  Main Entrance and check in at the main information desk   Today you received the following chemotherapy agents Gemzar. Follow-up as scheduled. Call clinic for any questions or concerns  To help prevent nausea and vomiting after your treatment, we encourage you to take your nausea medication   If you develop nausea and vomiting, or diarrhea that is not controlled by your medication, call the clinic.  The clinic phone number is [phone]. Office hours are Monday-Friday 8:30am-5:00pm.  BELOW ARE SYMPTOMS THAT SHOULD BE REPORTED IMMEDIATELY:  *FEVER GREATER THAN 101.0 F  *CHILLS WITH OR WITHOUT FEVER  NAUSEA AND VOMITING THAT IS NOT CONTROLLED WITH YOUR NAUSEA MEDICATION  *UNUSUAL SHORTNESS OF BREATH  *UNUSUAL BRUISING OR BLEEDING  TENDERNESS IN MOUTH AND THROAT WITH OR WITHOUT PRESENCE OF ULCERS  *URINARY PROBLEMS  *BOWEL PROBLEMS  UNUSUAL RASH Items with * indicate a potential emergency and should be followed up as soon as possible. If you have an emergency after office hours please contact your primary care physician or go to the nearest emergency department.  Please call the clinic during office hours if you have any questions or concerns.   You may also contact the Patient Navigator at [phone] should you have any questions or need assistance in obtaining follow up care.      Resources For Cancer Patients and their Caregivers ? American Cancer Society: Can assist with transportation, wigs, general needs, runs Look Good Feel Better.        1-888-227-6333 ? Cancer Care: Provides financial assistance, online support groups, medication/co-pay assistance.  1-800-813-HOPE  (4673) ? [facility] Assists [place] cancer patients and their families through emotional , educational and financial support.  336-427-4357 ? [place] DSS Where to apply for food stamps, Medicaid and utility assistance. 336-342-1394 ? RCATS: Transportation to medical appointments. 336-347-2287 ? Social Security Administration: May apply for disability if have a Stage IV cancer. 336-342-7796 1-800-772-1213 ? [place] [employer], Disability and Transit Services: Assists with nutrition, care and transit needs. 336-349-2343         

## 2017-11-18 ENCOUNTER — Ambulatory Visit (HOSPITAL_COMMUNITY)
Admission: RE | Admit: 2017-11-18 | Discharge: 2017-11-18 | Disposition: A | Discharge status: Home / Self Care | Payer: Medicare Other | Source: Ambulatory Visit | Attending: Oncology | Admitting: Oncology

## 2017-11-18 DIAGNOSIS — C7801 Secondary malignant neoplasm of right lung: Secondary | ICD-10-CM | POA: Insufficient documentation

## 2017-11-18 DIAGNOSIS — K429 Umbilical hernia without obstruction or gangrene: Secondary | ICD-10-CM | POA: Insufficient documentation

## 2017-11-18 DIAGNOSIS — J432 Centrilobular emphysema: Secondary | ICD-10-CM | POA: Insufficient documentation

## 2017-11-18 DIAGNOSIS — I700 Atherosclerosis of aorta: Secondary | ICD-10-CM | POA: Insufficient documentation

## 2017-11-18 DIAGNOSIS — N1330 Unspecified hydronephrosis: Secondary | ICD-10-CM | POA: Diagnosis not present

## 2017-11-18 DIAGNOSIS — C679 Malignant neoplasm of bladder, unspecified: Secondary | ICD-10-CM | POA: Diagnosis present

## 2017-11-18 DIAGNOSIS — I2510 Atherosclerotic heart disease of native coronary artery without angina pectoris: Secondary | ICD-10-CM | POA: Insufficient documentation

## 2017-11-18 DIAGNOSIS — D4959 Neoplasm of unspecified behavior of other genitourinary organ: Secondary | ICD-10-CM | POA: Diagnosis not present

## 2017-11-18 DIAGNOSIS — C7802 Secondary malignant neoplasm of left lung: Secondary | ICD-10-CM | POA: Diagnosis not present

## 2017-11-18 LAB — GLUCOSE, CAPILLARY: GLUCOSE-CAPILLARY: 84 mg/dL (ref 65–99)

## 2017-11-18 MED ORDER — FLUDEOXYGLUCOSE F - 18 (FDG) INJECTION
8.9000 | Freq: Once | INTRAVENOUS | Status: AC | PRN
  Administered 2017-11-18: 8.9 via INTRAVENOUS

## 2017-11-20 ENCOUNTER — Encounter (HOSPITAL_COMMUNITY): Payer: Self-pay | Admitting: Oncology

## 2017-11-20 ENCOUNTER — Encounter (HOSPITAL_COMMUNITY): Payer: Medicare Other

## 2017-11-20 ENCOUNTER — Encounter (HOSPITAL_BASED_OUTPATIENT_CLINIC_OR_DEPARTMENT_OTHER): Payer: Medicare Other | Admitting: Oncology

## 2017-11-20 ENCOUNTER — Other Ambulatory Visit: Payer: Self-pay

## 2017-11-20 VITALS — BP 146/60 | HR 75 | Temp 97.8°F | Resp 21 | Wt 175.8 lb

## 2017-11-20 DIAGNOSIS — D6481 Anemia due to antineoplastic chemotherapy: Secondary | ICD-10-CM

## 2017-11-20 DIAGNOSIS — Z720 Tobacco use: Secondary | ICD-10-CM | POA: Diagnosis not present

## 2017-11-20 DIAGNOSIS — C670 Malignant neoplasm of trigone of bladder: Secondary | ICD-10-CM | POA: Diagnosis not present

## 2017-11-20 DIAGNOSIS — C7800 Secondary malignant neoplasm of unspecified lung: Secondary | ICD-10-CM

## 2017-11-20 DIAGNOSIS — N1330 Unspecified hydronephrosis: Secondary | ICD-10-CM | POA: Diagnosis not present

## 2017-11-20 LAB — COMPREHENSIVE METABOLIC PANEL
ALBUMIN: 2.9 g/dL — AB (ref 3.5–5.0)
ALT: 15 U/L — ABNORMAL LOW (ref 17–63)
AST: 21 U/L (ref 15–41)
Alkaline Phosphatase: 58 U/L (ref 38–126)
Anion gap: 11 (ref 5–15)
BILIRUBIN TOTAL: 0.3 mg/dL (ref 0.3–1.2)
BUN: 32 mg/dL — AB (ref 6–20)
CHLORIDE: 105 mmol/L (ref 101–111)
CO2: 23 mmol/L (ref 22–32)
Calcium: 8.6 mg/dL — ABNORMAL LOW (ref 8.9–10.3)
Creatinine, Ser: 2.17 mg/dL — ABNORMAL HIGH (ref 0.61–1.24)
GFR calc Af Amer: 34 mL/min — ABNORMAL LOW (ref >60)
GFR calc non Af Amer: 29 mL/min — ABNORMAL LOW (ref >60)
GLUCOSE: 111 mg/dL — AB (ref 65–99)
POTASSIUM: 4.3 mmol/L (ref 3.5–5.1)
Sodium: 139 mmol/L (ref 135–145)
Total Protein: 6.6 g/dL (ref 6.5–8.1)

## 2017-11-20 NOTE — Patient Instructions (Signed)
[facility] at [facility] Discharge Instructions  RECOMMENDATIONS MADE BY THE CONSULTANT AND ANY TEST RESULTS WILL BE SENT TO YOUR REFERRING PHYSICIAN.  You were seen today by Dr. Louise Zhou    Thank you for choosing [facility] at [facility] to provide your oncology and hematology care.  To afford each patient quality time with our provider, please arrive at least 15 minutes before your scheduled appointment time.    If you have a lab appointment with the Cancer Center please come in thru the  Main Entrance and check in at the main information desk  You need to re-schedule your appointment should you arrive 10 or more minutes late.  We strive to give you quality time with our providers, and arriving late affects you and other patients whose appointments are after yours.  Also, if you no show three or more times for appointments you may be dismissed from the clinic at the providers discretion.     Again, thank you for choosing [facility].  Our hope is that these requests will decrease the amount of time that you wait before being seen by our physicians.       _____________________________________________________________  Should you have questions after your visit to [facility], please contact our office at [phone] between the hours of 8:30 a.m. and 4:30 p.m.  Voicemails left after 4:30 p.m. will not be returned until the following business day.  For prescription refill requests, have your pharmacy contact our office.       Resources For Cancer Patients and their Caregivers ? American Cancer Society: Can assist with transportation, wigs, general needs, runs Look Good Feel Better.        1-888-227-6333 ? Cancer Care: Provides financial assistance, online support groups, medication/co-pay assistance.  1-800-813-HOPE (4673) ? [facility] Assists [place] cancer patients and their  families through emotional , educational and financial support.  336-427-4357 ? [place] DSS Where to apply for food stamps, Medicaid and utility assistance. 336-342-1394 ? RCATS: Transportation to medical appointments. 336-347-2287 ? Social Security Administration: May apply for disability if have a Stage IV cancer. 336-342-7796 1-800-772-1213 ? [place] Aging, Disability and Transit Services: Assists with nutrition, care and transit needs. 336-349-2343  Cancer Center Support Programs: @10RELATIVEDAYS@ > Cancer Support Group  2nd Tuesday of the month 1pm-2pm, Journey Room  > Creative Journey  3rd Tuesday of the month 1130am-1pm, Journey Room  > Look Good Feel Better  1st Wednesday of the month 10am-12 noon, Journey Room (Call [employer] to register 1-800-395-5775)    

## 2017-11-20 NOTE — Progress Notes (Signed)
[facility]  PROGRESS NOTE  Patient Care Team: [name], MD as PCP - General (Internal Medicine)  CHIEF COMPLAINTS/PURPOSE OF CONSULTATION:   Oncology History   Per available records had bladder tumor removed but had extension into the muscularis propria. CT showed no extension beyond the bladder. Has seen 2 urologists in consultation. Adamant about not having a cystectomy and ileal conduit.     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 No findings for local spread of disease or metastatic disease      03/25/2016 Surgery    TURBT 3cm right trigonal bladder tumor. followed by intravesical epirubicin instillation      03/25/2016 Pathology Results    bladder TURBT infiltrative high grade papillary urothelial carcinoma, invades the mulcularis propria (detrusor) no LVI       04/14/2016 Miscellaneous    [name] consultation in [place] for consideration of cystectomy. patient also offered repeat resection with BCG, and CHEMO/XRT      06/17/2016 Pathology Results    TURB- high grade papillary urothelial carcinoma with focal stromal invasion       Radiation Therapy    Planned to be completed on 09/10/2016.      07/22/2016 -  Chemotherapy    The patient had palonosetron (ALOXI) injection 0.25 mg, 0.25 mg, Intravenous,  Once, 2 of 5 cycles Administration: 0.25 mg (07/28/2016)  CARBOplatin (PARAPLATIN) 160 mg in sodium chloride 0.9 % 100 mL chemo infusion, 160 mg (100 % of original dose 160.6 mg), Intravenous,  Once, 2 of 5 cycles Dose modification:   (original dose 160.6 mg, Cycle 1),   (original dose 160.6 mg, Cycle 1),   (original dose 160.6 mg, Cycle 1),   (original dose 160.6 mg, Cycle 2) Administration: 180 mg (07/28/2016)  for chemotherapy treatment.        04/07/2017 Relapse/Recurrence    CT Abd/pelvis: IMPRESSION: 1. Recurrent bladder carcinoma with  synchronous disease in the distal right ureter causing severe right hydronephrosis and decreased right renal function. 2. Bilateral renal stones. 3. Aortic atherosclerosis (ICD10-170.0). Coronary artery calcification. 4. Small periumbilical hernia contains a knuckle of unobstructed small bowel.       06/10/2017 PET scan    1. Metachronous urothelial carcinoma within the distal right ureter, as on prior CT. 2. No evidence of nodal metastasis. 3. Bilateral hypermetabolic pulmonary nodules, favoring pulmonary metastasis. Especially given emphysema, 1 or more primary bronchogenic carcinomas cannot be excluded but are felt less likely. 4. Coronary artery atherosclerosis. Aortic Atherosclerosis (ICD10-I70.0). 5. Bladder not well evaluated secondary to hypermetabolic urine within.       06/18/2017 Procedure    Placement of single lumen port a cath via right internal jugular vein. The catheter tip lies at the cavo-atrial junction. A power injectable port a cath was placed and is ready for immediate use.      06/29/2017 -  Chemotherapy    Carboplatin/Gemcitabine every 21 days       08/14/2017 Imaging    CT C/A/P IMPRESSION: 1. Overall stable pulmonary nodules. Knee soft tissue rim around the cavitary lesion in the left upper lobe is slightly thinner. No new pulmonary nodules or progressive findings in the chest. 2. Stable advanced emphysematous changes and pulmonary scarring. 3. No mediastinal or hilar mass or lymphadenopathy. 4. Improved CT appearance of the bladder. The asymmetric areas of bladder wall enhancement have improved. There is  still diffuse irregular bladder wall thickening and distal right ureteral tumor appears stable. Stable right-sided hydroureteronephrosis. 5. Stable cirrhotic changes involving the liver and stable liver cysts. 6. Stable scattered mesenteric and retroperitoneal and pelvic lymph nodes but no mass or overt adenopathy. 7. Stable severe/advanced  atherosclerotic calcifications involving the thoracic and abdominal aorta and branch vessels.      11/18/2017 Imaging    PET CT: IMPRESSION: 1. Significant partial metabolic response. Bilateral mildly hypermetabolic pulmonary metastases are decreased in size and metabolism. No new or progressive hypermetabolic metastatic disease. 2. Stable hypermetabolic distal right ureteral tumor with marked right hydroureteronephrosis. 3. Chronic findings include: Aortic Atherosclerosis (ICD10-I70.0) and Emphysema (ICD10-J43.9). Three-vessel coronary atherosclerosis. Stable small fat containing umbilical hernia.      COPD Hypertensive cardiomyopathy  HISTORY OF PRESENTING ILLNESS:  Carl Murphy 71 y.o. male is here for ongoing follow-up of muscle invasive carcinoma of the bladder. He completed concurrent carbo/XRT. Patient has refused cystectomy.  Patient was hospitalized at [facility] from 05/07/17 through 05/12/17 for hematuria. Patient was placed on continuous bladder irrigation for persistent severe hematuria for which she was given 5 units PRBC transfusion. He was seen by his urologist who taken to the OR on 05/11/17 for cystoscopy, clot evaluation, TURBT 7 cm recurrent bladder tumor and placement of a right double J stent was attempted. Final pathology demonstrated a muscle invasive disease. His hemoglobin at the time of discharge was 8.9 g/dL. Patient underwent CT abdomen and pelvis with and without contrast on 04/07/17 which demonstrated recurrent bladder carcinoma with synchronous disease in the distal right ureter causing severe right hydronephrosis and decreased right renal function. Dr. Diona Fanti had recommended for patient to undergo a right ureteral stent for his right hydronephrosis by IR, but patient has refused so far. He was seen by Dr. Alen Blew, oncology at [facility], who recommended for patient to get a PET scans rule out metastatic disease and systemic chemotherapy for palliative  treatment of his recurrent bladder cancer. Patient underwent a PET scan on 06/10/17 which demonstrated metasynchronous urothelial carcinoma within the distal right ureter, no evidence of nodal metastasis, bilateral hypermetabolic pulmonary nodules favoring pulmonary metastasis.  INTERVAL HISTORY: Patient presents today for review of his PET-CT results.  Patient states he continues to feel tired. Denies any chest pain, shortness of breath,abdominal pain, focal weakness, or recent infections. He continues to have intermittent hematuria. He states he recently had a cystoscopy by Dr. Diona Fanti and did not need any scrapings or biopsies performed.    MEDICAL HISTORY:  Past Medical History:  Diagnosis Date  . Arthritis   . [facility])    Bladder Cancer  . CHF (congestive heart failure) ([place])   . COPD (chronic obstructive pulmonary disease) ([place])   . Depression   . Hard of hearing    bilat   . Hypertension   . Malignant neoplasm of trigone of bladder ([place]) 03/25/2016  . PONV (postoperative nausea and vomiting)   . Shortness of breath dyspnea    heat; walking up set of stairs; pt states can walk up 12-14 stairs slowly without having to stop to catch his breath    SURGICAL HISTORY: Past Surgical History:  Procedure Laterality Date  . CYSTOSCOPY N/A 06/16/2016   Procedure: CYSTOSCOPY;  Surgeon: Franchot Gallo, MD;  Location: [facility];  Service: Urology;  Laterality: N/A;  . IR FLUORO GUIDE PORT INSERTION RIGHT  06/18/2017  . IR US GUIDE VASC ACCESS RIGHT  06/18/2017  . TRANSURETHRAL RESECTION OF BLADDER TUMOR N/A  03/25/2016   Procedure: TRANSURETHRAL RESECTION OF BLADDER TUMOR (TURBT);  Surgeon: Franchot Gallo, MD;  Location: [facility];  Service: Urology;  Laterality: N/A;  . TRANSURETHRAL RESECTION OF BLADDER TUMOR N/A 06/16/2016   Procedure: TRANSURETHRAL RESECTION OF BLADDER TUMOR (TURBT);  Surgeon: Franchot Gallo, MD;  Location: [facility];  Service: Urology;  Laterality: N/A;  . TRANSURETHRAL  RESECTION OF BLADDER TUMOR N/A 12/16/2016   Procedure: TRANSURETHRAL RESECTION OF BLADDER TUMOR (TURBT);  Surgeon: Franchot Gallo, MD;  Location: [facility];  Service: Urology;  Laterality: N/A;  . TRANSURETHRAL RESECTION OF BLADDER TUMOR N/A 05/11/2017   Procedure: TRANSURETHRAL RESECTION OF BLADDER TUMOR (TURBT) CLOT EVACUATION;  Surgeon: Franchot Gallo, MD;  Location: [facility];  Service: Urology;  Laterality: N/A;  1 HR [phone] BLUE [id number]    SOCIAL HISTORY: Social History   Socioeconomic History  . Marital status: Married    Spouse name: Not on file  . Number of children: Not on file  . Years of education: Not on file  . Highest education level: Not on file  Social Needs  . Financial resource strain: Not on file  . Food insecurity - worry: Not on file  . Food insecurity - inability: Not on file  . Transportation needs - medical: Not on file  . Transportation needs - non-medical: Not on file  Occupational History  . Not on file  Tobacco Use  . Smoking status: Current Every Day Smoker    Packs/day: 2.00    Years: 50.00    Pack years: 100.00    Types: Cigarettes  . Smokeless tobacco: Never Used  Substance and Sexual Activity  . Alcohol use: No    Comment: occasionally  . Drug use: No  . Sexual activity: No    Birth control/protection: None  Other Topics Concern  . Not on file  Social History Narrative  . 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 Hypertension  Mother   . Asthma Father   . Emphysema Father     Both parents deceased Mom was early 15's when she died; he was around 80-something Dad had asthma and emphysema really bad; thinks his heart finally gave out He says his mother's "intestines broke" and it was "fast forward downhill from there."  He is an only child.   ALLERGIES:  is allergic to carboplatin.  MEDICATIONS:  Current Outpatient Medications  Medication Sig Dispense Refill  . carvedilol (COREG) 25 MG tablet Take 25 mg by mouth 2 (two) times daily.   11  . citalopram (CELEXA) 20 MG tablet Take 20 mg by mouth at bedtime.   11  . Diphenhyd-Hydrocort-Nystatin (FIRST-DUKES MOUTHWASH) SUSP Use as directed 5 mLs in the mouth or throat 4 (four) times daily -  before meals and at bedtime. 480 mL 1  . fluticasone (FLONASE) 50 MCG/ACT nasal spray Place 2 sprays into both nostrils daily as needed for allergies or rhinitis.    Marland Kitchen lidocaine (XYLOCAINE) 2 % solution Swish and  swallow 15ml three to four times a day as needed for throat pain. 480 mL 2  . oxybutynin (DITROPAN) 5 MG tablet Take 1 tablet (5 mg total) by mouth every 6 (six) hours as needed for bladder spasms. 20 tablet 0  . CARBOPLATIN IV Inject into the vein. Day 1 every 21 days    . Gemcitabine HCl (GEMZAR IV) Inject into the vein. Day 1, day 8 every 21 days     No current facility-administered medications for this visit.    Review of Systems  Constitutional: Positive for malaise/fatigue. Negative for chills and fever.  HENT: Negative.   Eyes: Negative.   Respiratory: Negative for cough and shortness of breath.   Cardiovascular: Negative.  Negative for chest pain.  Gastrointestinal: Negative for constipation and diarrhea.  Genitourinary: Positive for frequency. Negative for dysuria and hematuria.       Blood clots in urine for 3 days last week  Musculoskeletal: Negative.   Skin: Negative.   Neurological: Negative.   Endo/Heme/Allergies: Negative.     Psychiatric/Behavioral: Negative.   All other systems reviewed and are negative. 14 point ROS was done and is otherwise as detailed above or in HPI    PHYSICAL EXAMINATION: ECOG PERFORMANCE STATUS: 1 - Symptomatic but completely ambulatory  Physical Exam  Constitutional: He is oriented to person, place, and time and well-developed, well-nourished, and in no distress. No distress.  HENT:  Head: Normocephalic and atraumatic.  Mouth/Throat: Oropharynx is clear and moist. No oropharyngeal exudate.  Eyes: Conjunctivae and EOM are normal. Pupils are equal, round, and reactive to light. Right eye exhibits no discharge. Left eye exhibits no discharge. No scleral icterus.  Neck: Normal range of motion. Neck supple. No tracheal deviation present. No thyromegaly present.  Cardiovascular: Normal rate, regular rhythm and normal heart sounds.  No murmur heard. Pulmonary/Chest: Effort normal and breath sounds normal. No respiratory distress. He has no wheezes.  Abdominal: Soft. Bowel sounds are normal. He exhibits no distension and no mass. There is no tenderness. There is no rebound and no guarding.  Umbilical hernia  Musculoskeletal: Normal range of motion. He exhibits no edema or tenderness.  Lymphadenopathy:    He has no cervical adenopathy.  Neurological: He is alert and oriented to person, place, and time. No cranial nerve deficit. Gait normal. Coordination normal.  Skin: Skin is warm and dry. No rash noted. He is not diaphoretic. No erythema.  Psychiatric: Mood, memory, affect and judgment normal.  Nursing note and vitals reviewed.   LABORATORY DATA:  I have reviewed the data as listed Lab Results  Component Value Date   WBC 6.6 11/12/2017   HGB 10.0 (L) 11/12/2017   HCT 30.8 (L) 11/12/2017   MCV 97.2 11/12/2017   PLT 189 11/12/2017   CMP     Component Value Date/Time   NA 139 11/20/2017 1045   K 4.3 11/20/2017 1045   CL 105 11/20/2017 1045   CO2 23 11/20/2017 1045   GLUCOSE  111 (H) 11/20/2017 1045   BUN 32 (H) 11/20/2017 1045   CREATININE 2.17 (H) 11/20/2017 1045   CALCIUM 8.6 (L) 11/20/2017 1045   PROT 6.6 11/20/2017 1045   ALBUMIN 2.9 (L) 11/20/2017 1045   AST 21 11/20/2017 1045   ALT 15 (L) 11/20/2017 1045   ALKPHOS 58 11/20/2017 1045   BILITOT 0.3 11/20/2017 1045   GFRNONAA 29 (L) 11/20/2017 1045   GFRAA 34 (L) 11/20/2017 1045     RADIOGRAPHIC STUDIES: I have personally reviewed the radiological  images as listed and agreed with the findings in the report. CLINICAL DATA: Hematuria and frequent urination.  EXAM: CT ABDOMEN AND PELVIS WITHOUT AND WITH CONTRAST  TECHNIQUE: Multidetector CT imaging of the abdomen and pelvis was performed following the standard protocol before and following the bolus administration of intravenous contrast.  CONTRAST: 154mL OMNIPAQUE IOHEXOL 300 MG/ML SOLN  COMPARISON: CT scan 03/31/2013  FINDINGS: Lower chest: The lung bases are clear of acute process. No worrisome pulmonary lesions. No pleural effusion. The heart is normal in size. No pericardial effusion. Coronary artery calcifications are noted. Dense distal aortic calcifications are noted. The esophagus is grossly normal.  Hepatobiliary: Small low-attenuation liver lesions are stable and likely small cysts. Lesion and segment 8 is also stable and likely a hemangioma. No worrisome hepatic lesions or intrahepatic biliary dilatation. The gallbladder is normal. No common bile duct dilatation.  Pancreas: No mass, inflammation or ductal dilatation.  Spleen: Normal size. No focal lesions.  Adrenals/Urinary Tract: The adrenal glands are normal and stable.  Numerous left renal artery calcifications. I do not see any definite calculi in the left collecting system. Small left renal cysts are noted. The no right renal calculi or hydronephrosis. Both ureters are normal. No ureteral calculi. No significant collecting system abnormality on the  delayed images.  There is a large irregular bladder mass at on the right side. This demonstrates moderate contrast enhancement. It measures 3.2 x 3.0 cm. No ureteral obstruction.  Stomach/Bowel: The stomach, duodenum, small bowel and colon are unremarkable without oral contrast. No inflammatory changes, mass lesions or obstructive findings. The terminal ileum is normal. The appendix is normal. Moderate sigmoid diverticulosis without findings for acute diverticulitis.  Vascular/Lymphatic: Advanced aortic and branch vessel atherosclerotic calcifications. No focal aneurysm or dissection. The major venous structures are patent.  No mesenteric or retroperitoneal mass or lymphadenopathy. Small scattered lymph nodes are noted. Small bilateral external iliac artery lymph nodes are stable. No pelvic lymphadenopathy. No inguinal adenopathy. The prostate gland and seminal vesicles are grossly normal. New line small  Small Periumbilical abdominal wall hernia containing fat and a small bowel loop. This has enlarged since prior CT scan.  Other: No subcutaneous lesions or ascites.  Musculoskeletal: No significant bony findings.  IMPRESSION: 1. 3.2 x 3.0 cm bladder mass consistent with transitional cell carcinoma. No findings for local spread of disease or metastatic disease. 2. No worrisome renal or ureteral lesions. 3. Small benign-appearing and stable hepatic lesions. 4. Advanced atherosclerotic calcifications involving the aorta and branch vessels. 5. Small periumbilical abdominal wall hernia containing fat and a small bowel loop.   Electronically Signed  By: Marijo Sanes M.D.  On: 02/18/2016 16:47   ASSESSMENT & PLAN:  Stage II (T2N0M0) Muscle invasive carcinoma of the trigone of the bladder, recurrent disease in 03/2017 with recurrent bladder tumor and new pulm mets. He now has Stage IV disease. Severe right hydronephrosis due to recurrent bladder carcinoma with  synchronous disease in the distal right ureter  Tobacco Abuse CKD COPD Anemia secondary to chemotherapy  -He has completed 6 cycles of carbo/gemzar on 11/12/17, carboplatin was omitted from cycle 6 due to previous reaction with cycle 5 carboplatin with SOB during treatment. -I have reviewed his restaging PET CT in detail with him in detail today. He's had ignificant partial metabolic response. Bilateral mildly hypermetabolic pulmonary metastases are decreased in size and metabolism. No new or progressive hypermetabolic metastatic disease. Stable hypermetabolic distal right ureteral tumor with marked right hydroureteronephrosis. -I will start him on maintenance  gemzar starting December 03, 2017. Will omit day 8 and do day 1 and day 15 out of 28 day cycle due to previous history of significant cytopenias with chemo and chemo-related fatigue.  -RTC in 4 weeks for follow up. Plan to restage after 3 cycles of gemzar.   This note was electronically signed.    Twana First, MD  11/20/2017 1:02 PM

## 2017-12-03 ENCOUNTER — Encounter (HOSPITAL_COMMUNITY): Payer: Self-pay

## 2017-12-03 ENCOUNTER — Encounter: Payer: Self-pay | Admitting: Oncology

## 2017-12-03 ENCOUNTER — Inpatient Hospital Stay (HOSPITAL_COMMUNITY): Payer: Medicare Other | Attending: Oncology

## 2017-12-03 ENCOUNTER — Other Ambulatory Visit: Payer: Self-pay

## 2017-12-03 VITALS — BP 188/74 | HR 64 | Temp 98.5°F | Resp 20 | Wt 180.4 lb

## 2017-12-03 DIAGNOSIS — N189 Chronic kidney disease, unspecified: Secondary | ICD-10-CM | POA: Insufficient documentation

## 2017-12-03 DIAGNOSIS — D649 Anemia, unspecified: Secondary | ICD-10-CM | POA: Insufficient documentation

## 2017-12-03 DIAGNOSIS — R062 Wheezing: Secondary | ICD-10-CM | POA: Diagnosis not present

## 2017-12-03 DIAGNOSIS — R51 Headache: Secondary | ICD-10-CM | POA: Insufficient documentation

## 2017-12-03 DIAGNOSIS — Z720 Tobacco use: Secondary | ICD-10-CM | POA: Diagnosis not present

## 2017-12-03 DIAGNOSIS — Z5111 Encounter for antineoplastic chemotherapy: Secondary | ICD-10-CM | POA: Diagnosis not present

## 2017-12-03 DIAGNOSIS — C670 Malignant neoplasm of trigone of bladder: Secondary | ICD-10-CM | POA: Diagnosis not present

## 2017-12-03 DIAGNOSIS — C7800 Secondary malignant neoplasm of unspecified lung: Secondary | ICD-10-CM | POA: Insufficient documentation

## 2017-12-03 DIAGNOSIS — R0602 Shortness of breath: Secondary | ICD-10-CM | POA: Diagnosis not present

## 2017-12-03 DIAGNOSIS — C679 Malignant neoplasm of bladder, unspecified: Secondary | ICD-10-CM

## 2017-12-03 LAB — CBC WITH DIFFERENTIAL/PLATELET
BASOS ABS: 0 10*3/uL (ref 0.0–0.1)
BASOS PCT: 0 %
EOS PCT: 2 %
Eosinophils Absolute: 0.1 10*3/uL (ref 0.0–0.7)
HEMATOCRIT: 29.1 % — AB (ref 39.0–52.0)
Hemoglobin: 9.3 g/dL — ABNORMAL LOW (ref 13.0–17.0)
Lymphocytes Relative: 13 %
Lymphs Abs: 0.7 10*3/uL (ref 0.7–4.0)
MCH: 31 pg (ref 26.0–34.0)
MCHC: 32 g/dL (ref 30.0–36.0)
MCV: 97 fL (ref 78.0–100.0)
MONO ABS: 0.9 10*3/uL (ref 0.1–1.0)
Monocytes Relative: 16 %
NEUTROS ABS: 3.9 10*3/uL (ref 1.7–7.7)
Neutrophils Relative %: 70 %
PLATELETS: 312 10*3/uL (ref 150–400)
RBC: 3 MIL/uL — AB (ref 4.22–5.81)
RDW: 19.9 % — ABNORMAL HIGH (ref 11.5–15.5)
WBC: 5.6 10*3/uL (ref 4.0–10.5)

## 2017-12-03 LAB — COMPREHENSIVE METABOLIC PANEL
ALBUMIN: 3.1 g/dL — AB (ref 3.5–5.0)
ALT: 9 U/L — ABNORMAL LOW (ref 17–63)
ANION GAP: 12 (ref 5–15)
AST: 19 U/L (ref 15–41)
Alkaline Phosphatase: 60 U/L (ref 38–126)
BUN: 28 mg/dL — AB (ref 6–20)
CHLORIDE: 104 mmol/L (ref 101–111)
CO2: 24 mmol/L (ref 22–32)
Calcium: 8.9 mg/dL (ref 8.9–10.3)
Creatinine, Ser: 2.19 mg/dL — ABNORMAL HIGH (ref 0.61–1.24)
GFR calc Af Amer: 34 mL/min — ABNORMAL LOW (ref >60)
GFR calc non Af Amer: 29 mL/min — ABNORMAL LOW (ref >60)
GLUCOSE: 106 mg/dL — AB (ref 65–99)
POTASSIUM: 4.2 mmol/L (ref 3.5–5.1)
SODIUM: 140 mmol/L (ref 135–145)
Total Bilirubin: 0.5 mg/dL (ref 0.3–1.2)
Total Protein: 6.7 g/dL (ref 6.5–8.1)

## 2017-12-03 LAB — IRON AND TIBC
Iron: 45 ug/dL (ref 45–182)
SATURATION RATIOS: 17 % — AB (ref 17.9–39.5)
TIBC: 270 ug/dL (ref 250–450)
UIBC: 225 ug/dL

## 2017-12-03 MED ORDER — HEPARIN SOD (PORK) LOCK FLUSH 100 UNIT/ML IV SOLN
500.0000 [IU] | Freq: Once | INTRAVENOUS | Status: AC | PRN
  Administered 2017-12-03: 500 [IU]

## 2017-12-03 MED ORDER — SODIUM CHLORIDE 0.9 % IV SOLN
Freq: Once | INTRAVENOUS | Status: AC
  Administered 2017-12-03: 14:00:00 via INTRAVENOUS

## 2017-12-03 MED ORDER — SODIUM CHLORIDE 0.9 % IV SOLN
1000.0000 mg/m2 | Freq: Once | INTRAVENOUS | Status: AC
  Administered 2017-12-03: 1976 mg via INTRAVENOUS
  Filled 2017-12-03: qty 52

## 2017-12-03 MED ORDER — PROCHLORPERAZINE MALEATE 10 MG PO TABS
10.0000 mg | ORAL_TABLET | Freq: Once | ORAL | Status: AC
  Administered 2017-12-03: 10 mg via ORAL

## 2017-12-03 NOTE — Progress Notes (Signed)
Tolerated infusion w/o adverse reaction.  Alert, in no distress.  VSS.  Discharged ambulatory.  

## 2017-12-06 ENCOUNTER — Other Ambulatory Visit (HOSPITAL_COMMUNITY): Payer: Self-pay | Admitting: Oncology

## 2017-12-15 ENCOUNTER — Ambulatory Visit: Payer: Medicare Other | Admitting: Urology

## 2017-12-15 DIAGNOSIS — C678 Malignant neoplasm of overlapping sites of bladder: Secondary | ICD-10-CM

## 2017-12-15 DIAGNOSIS — N130 Hydronephrosis with ureteropelvic junction obstruction: Secondary | ICD-10-CM

## 2017-12-16 ENCOUNTER — Other Ambulatory Visit: Payer: Self-pay | Admitting: Urology

## 2017-12-16 DIAGNOSIS — N1330 Unspecified hydronephrosis: Secondary | ICD-10-CM

## 2017-12-17 ENCOUNTER — Encounter (HOSPITAL_COMMUNITY): Payer: Self-pay

## 2017-12-17 ENCOUNTER — Inpatient Hospital Stay (HOSPITAL_BASED_OUTPATIENT_CLINIC_OR_DEPARTMENT_OTHER): Payer: Medicare Other | Admitting: Adult Health

## 2017-12-17 ENCOUNTER — Inpatient Hospital Stay (HOSPITAL_COMMUNITY): Payer: Medicare Other

## 2017-12-17 ENCOUNTER — Encounter (HOSPITAL_COMMUNITY): Payer: Self-pay | Admitting: Internal Medicine

## 2017-12-17 VITALS — BP 189/72 | HR 63 | Temp 97.1°F | Resp 20 | Wt 180.6 lb

## 2017-12-17 DIAGNOSIS — R51 Headache: Secondary | ICD-10-CM

## 2017-12-17 DIAGNOSIS — N189 Chronic kidney disease, unspecified: Secondary | ICD-10-CM | POA: Diagnosis not present

## 2017-12-17 DIAGNOSIS — D649 Anemia, unspecified: Secondary | ICD-10-CM | POA: Diagnosis not present

## 2017-12-17 DIAGNOSIS — C670 Malignant neoplasm of trigone of bladder: Secondary | ICD-10-CM

## 2017-12-17 DIAGNOSIS — C7800 Secondary malignant neoplasm of unspecified lung: Secondary | ICD-10-CM | POA: Diagnosis not present

## 2017-12-17 DIAGNOSIS — Z5111 Encounter for antineoplastic chemotherapy: Secondary | ICD-10-CM

## 2017-12-17 DIAGNOSIS — R062 Wheezing: Secondary | ICD-10-CM

## 2017-12-17 DIAGNOSIS — Z720 Tobacco use: Secondary | ICD-10-CM

## 2017-12-17 DIAGNOSIS — R0602 Shortness of breath: Secondary | ICD-10-CM

## 2017-12-17 LAB — CBC WITH DIFFERENTIAL/PLATELET
Basophils Absolute: 0 10*3/uL (ref 0.0–0.1)
Basophils Relative: 0 %
EOS ABS: 0.1 10*3/uL (ref 0.0–0.7)
EOS PCT: 1 %
HCT: 26.4 % — ABNORMAL LOW (ref 39.0–52.0)
HEMOGLOBIN: 8.4 g/dL — AB (ref 13.0–17.0)
LYMPHS ABS: 0.7 10*3/uL (ref 0.7–4.0)
LYMPHS PCT: 12 %
MCH: 31.3 pg (ref 26.0–34.0)
MCHC: 31.8 g/dL (ref 30.0–36.0)
MCV: 98.5 fL (ref 78.0–100.0)
MONOS PCT: 7 %
Monocytes Absolute: 0.4 10*3/uL (ref 0.1–1.0)
Neutro Abs: 5 10*3/uL (ref 1.7–7.7)
Neutrophils Relative %: 80 %
PLATELETS: 158 10*3/uL (ref 150–400)
RBC: 2.68 MIL/uL — ABNORMAL LOW (ref 4.22–5.81)
RDW: 20 % — ABNORMAL HIGH (ref 11.5–15.5)
WBC: 6.2 10*3/uL (ref 4.0–10.5)

## 2017-12-17 LAB — COMPREHENSIVE METABOLIC PANEL
ALT: 10 U/L — AB (ref 17–63)
ANION GAP: 9 (ref 5–15)
AST: 17 U/L (ref 15–41)
Albumin: 2.7 g/dL — ABNORMAL LOW (ref 3.5–5.0)
Alkaline Phosphatase: 63 U/L (ref 38–126)
BUN: 36 mg/dL — ABNORMAL HIGH (ref 6–20)
CALCIUM: 8.3 mg/dL — AB (ref 8.9–10.3)
CO2: 24 mmol/L (ref 22–32)
CREATININE: 2.2 mg/dL — AB (ref 0.61–1.24)
Chloride: 108 mmol/L (ref 101–111)
GFR, EST AFRICAN AMERICAN: 33 mL/min — AB (ref >60)
GFR, EST NON AFRICAN AMERICAN: 29 mL/min — AB (ref >60)
Glucose, Bld: 107 mg/dL — ABNORMAL HIGH (ref 65–99)
Potassium: 4.4 mmol/L (ref 3.5–5.1)
SODIUM: 141 mmol/L (ref 135–145)
Total Bilirubin: 0.4 mg/dL (ref 0.3–1.2)
Total Protein: 6.1 g/dL — ABNORMAL LOW (ref 6.5–8.1)

## 2017-12-17 MED ORDER — PROCHLORPERAZINE MALEATE 10 MG PO TABS
ORAL_TABLET | ORAL | Status: AC
  Filled 2017-12-17: qty 1

## 2017-12-17 MED ORDER — PROCHLORPERAZINE MALEATE 10 MG PO TABS: 10.0000 mg | ORAL_TABLET | Freq: Four times a day (QID) | ORAL | 0 refills | Status: DC | PRN

## 2017-12-17 MED ORDER — PROCHLORPERAZINE MALEATE 10 MG PO TABS
10.0000 mg | ORAL_TABLET | Freq: Once | ORAL | Status: AC
  Administered 2017-12-17: 10 mg via ORAL

## 2017-12-17 MED ORDER — SODIUM CHLORIDE 0.9% FLUSH
10.0000 mL | INTRAVENOUS | Status: DC | PRN
  Administered 2017-12-17: 10 mL
  Filled 2017-12-17: qty 10

## 2017-12-17 MED ORDER — HEPARIN SOD (PORK) LOCK FLUSH 100 UNIT/ML IV SOLN
500.0000 [IU] | Freq: Once | INTRAVENOUS | Status: AC | PRN
  Administered 2017-12-17: 500 [IU]
  Filled 2017-12-17: qty 5

## 2017-12-17 MED ORDER — SODIUM CHLORIDE 0.9 % IV SOLN
Freq: Once | INTRAVENOUS | Status: AC
  Administered 2017-12-17: 13:00:00 via INTRAVENOUS

## 2017-12-17 MED ORDER — SODIUM CHLORIDE 0.9 % IV SOLN
1000.0000 mg/m2 | Freq: Once | INTRAVENOUS | Status: AC
  Administered 2017-12-17: 1976 mg via INTRAVENOUS
  Filled 2017-12-17: qty 51.97

## 2017-12-17 NOTE — Patient Instructions (Signed)
[facility] Discharge Instructions for Patients Receiving Chemotherapy   Beginning January 23rd 2017 lab work for the Cancer Center will be done in the  Main lab at [facility] on 1st floor. If you have a lab appointment with the Cancer Center please come in thru the  Main Entrance and check in at the main information desk   Today you received the following chemotherapy agents Gemzar. Follow-up as scheduled. Call clinic for any questions or concerns  To help prevent nausea and vomiting after your treatment, we encourage you to take your nausea medication   If you develop nausea and vomiting, or diarrhea that is not controlled by your medication, call the clinic.  The clinic phone number is [phone]. Office hours are Monday-Friday 8:30am-5:00pm.  BELOW ARE SYMPTOMS THAT SHOULD BE REPORTED IMMEDIATELY:  *FEVER GREATER THAN 101.0 F  *CHILLS WITH OR WITHOUT FEVER  NAUSEA AND VOMITING THAT IS NOT CONTROLLED WITH YOUR NAUSEA MEDICATION  *UNUSUAL SHORTNESS OF BREATH  *UNUSUAL BRUISING OR BLEEDING  TENDERNESS IN MOUTH AND THROAT WITH OR WITHOUT PRESENCE OF ULCERS  *URINARY PROBLEMS  *BOWEL PROBLEMS  UNUSUAL RASH Items with * indicate a potential emergency and should be followed up as soon as possible. If you have an emergency after office hours please contact your primary care physician or go to the nearest emergency department.  Please call the clinic during office hours if you have any questions or concerns.   You may also contact the Patient Navigator at [phone] should you have any questions or need assistance in obtaining follow up care.      Resources For Cancer Patients and their Caregivers ? American Cancer Society: Can assist with transportation, wigs, general needs, runs Look Good Feel Better.        1-888-227-6333 ? Cancer Care: Provides financial assistance, online support groups, medication/co-pay assistance.  1-800-813-HOPE  (4673) ? [facility] Assists [place] cancer patients and their families through emotional , educational and financial support.  336-427-4357 ? [place] DSS Where to apply for food stamps, Medicaid and utility assistance. 336-342-1394 ? [facility]: Transportation to medical appointments. 336-347-2287 ? Social Security Administration: May apply for disability if have a Stage IV cancer. 336-342-7796 1-800-772-1213 ? [place] Aging, Disability and Transit Services: Assists with nutrition, care and transit needs. 336-349-2343         

## 2017-12-17 NOTE — Progress Notes (Signed)
[place], [place] [address]   CLINIC:  Medical Oncology/Hematology  REASON FOR VISIT:  Routine follow-up during active treatment for Stage IV bladder cancer with lung mets   CURRENT THERAPY: Gemzar days 1, 8, & 15 every 28 days (Carbo d/c'd 10/05/17 after infusion reaction cycle #5), beginning 06/29/17   BRIEF ONCOLOGIC HISTORY/HPI:  Oncology History   Per available records had bladder tumor removed but had extension into the muscularis propria. CT showed no extension beyond the bladder. Has seen 2 urologists in consultation. Adamant about not having a cystectomy and ileal conduit.     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 No findings for local spread of disease or metastatic disease      03/25/2016 Surgery    TURBT 3cm right trigonal bladder tumor. followed by intravesical epirubicin instillation      03/25/2016 Pathology Results    bladder TURBT infiltrative high grade papillary urothelial carcinoma, invades the mulcularis propria (detrusor) no LVI       04/14/2016 Miscellaneous    [name] consultation in [place] for consideration of cystectomy. patient also offered repeat resection with BCG, and CHEMO/XRT      06/17/2016 Pathology Results    TURB- high grade papillary urothelial carcinoma with focal stromal invasion       Radiation Therapy    Planned to be completed on 09/10/2016.      07/22/2016 -  Chemotherapy    The patient had palonosetron (ALOXI) injection 0.25 mg, 0.25 mg, Intravenous,  Once, 2 of 5 cycles Administration: 0.25 mg (07/28/2016)  CARBOplatin (PARAPLATIN) 160 mg in sodium chloride 0.9 % 100 mL chemo infusion, 160 mg (100 % of original dose 160.6 mg), Intravenous,  Once, 2 of 5 cycles Dose modification:   (original dose 160.6 mg, Cycle 1),   (original dose 160.6 mg, Cycle 1),   (original dose 160.6 mg, Cycle  1),   (original dose 160.6 mg, Cycle 2) Administration: 180 mg (07/28/2016)  for chemotherapy treatment.        04/07/2017 Relapse/Recurrence    CT Abd/pelvis: IMPRESSION: 1. Recurrent bladder carcinoma with synchronous disease in the distal right ureter causing severe right hydronephrosis and decreased right renal function. 2. Bilateral renal stones. 3. Aortic atherosclerosis (ICD10-170.0). Coronary artery calcification. 4. Small periumbilical hernia contains a knuckle of unobstructed small bowel.       06/10/2017 PET scan    1. Metachronous urothelial carcinoma within the distal right ureter, as on prior CT. 2. No evidence of nodal metastasis. 3. Bilateral hypermetabolic pulmonary nodules, favoring pulmonary metastasis. Especially given emphysema, 1 or more primary bronchogenic carcinomas cannot be excluded but are felt less likely. 4. Coronary artery atherosclerosis. Aortic Atherosclerosis (ICD10-I70.0). 5. Bladder not well evaluated secondary to hypermetabolic urine within.       06/18/2017 Procedure    Placement of single lumen port a cath via right internal jugular vein. The catheter tip lies at the cavo-atrial junction. A power injectable port a cath was placed and is ready for immediate use.      06/29/2017 -  Chemotherapy    Carboplatin/Gemcitabine every 21 days       08/14/2017 Imaging    CT C/A/P IMPRESSION: 1. Overall stable pulmonary nodules. Knee soft tissue rim around the cavitary lesion in the left upper lobe is slightly thinner. No new pulmonary nodules or progressive findings  in the chest. 2. Stable advanced emphysematous changes and pulmonary scarring. 3. No mediastinal or hilar mass or lymphadenopathy. 4. Improved CT appearance of the bladder. The asymmetric areas of bladder wall enhancement have improved. There is still diffuse irregular bladder wall thickening and distal right ureteral tumor appears stable. Stable right-sided  hydroureteronephrosis. 5. Stable cirrhotic changes involving the liver and stable liver cysts. 6. Stable scattered mesenteric and retroperitoneal and pelvic lymph nodes but no mass or overt adenopathy. 7. Stable severe/advanced atherosclerotic calcifications involving the thoracic and abdominal aorta and branch vessels.      11/18/2017 Imaging    PET CT: IMPRESSION: 1. Significant partial metabolic response. Bilateral mildly hypermetabolic pulmonary metastases are decreased in size and metabolism. No new or progressive hypermetabolic metastatic disease. 2. Stable hypermetabolic distal right ureteral tumor with marked right hydroureteronephrosis. 3. Chronic findings include: Aortic Atherosclerosis (ICD10-I70.0) and Emphysema (ICD10-J43.9). Three-vessel coronary atherosclerosis. Stable small fat containing umbilical hernia.        INTERVAL HISTORY:  Mr. 72 y.o. returns for consideration of next cycle of therapy for metastatic bladder cancer.   Carboplatin was discontinued after cycle #5 due to progressive shortness of breath, thought to possibly be late infusion reaction.  Carboplatin was then subsequently added to his allergy list at that time.  Now only receiving Gemzar alone, on days 1, 8, and 15.  Due for cycle #6, day 15 Gemzar today.  Overall, he tells me he has been feeling "pretty good."  Appetite and energy levels are fair.  Denies any bleeding episodes including blood in his urine, blood in his stool, nosebleeds, or gingival bleeding.  His breathing is at his baseline; he has intermittent wheezing which is chronic for him; also has occasional shortness of breath. He attributes some of his breathing changes to the weather and his allergies. "My breathing gets a little worse when it rains and the air is heavy." He uses Flonase PRN. He does not have or use any inhalers for COPD or asthma.    He has occasional headaches; again, he attributes this to his allergies and weather  changes.  Tylenol is effective "if they get too bad."  Denies any dizziness or falls.  Denies any N&V.  Denies any mucositis or sore throat.  His urologist is Dr. Eulogio Ditch. He sees him as directed.   Overall, he feels ready for next dose of Gemzar today as scheduled.    Therapy  Dates Notes  Concurrent chemoXRT with Carbo 07/2016-08/2016   Carbo day 1, Gemzar days 1 & 8 (then added Gemzar day 15 with Gemzar alone) 06/29/17-present Carboplatin d/c'd 10/05/17 (cycle #5) after SOB/infusion reaction     REVIEW OF SYSTEMS:  Review of Systems - Oncology , per HPI. Otherwise, 14-point ROS was completed and negative except as stated above.    PAST MEDICAL/SURGICAL HISTORY:  Past Medical History:  Diagnosis Date  . Arthritis   . Cancer [facility])    Bladder Cancer  . CHF (congestive heart failure) ([facility])   . COPD (chronic obstructive pulmonary disease) ([facility])   . Depression   . Hard of hearing    bilat   . Hypertension   . Malignant neoplasm of trigone of bladder ([place]) 03/25/2016  . PONV (postoperative nausea and vomiting)   . Shortness of breath dyspnea    heat; walking up set of stairs; pt states can walk up 12-14 stairs slowly without having to stop to catch his breath   Past Surgical History:  Procedure Laterality Date  . CYSTOSCOPY  N/A 06/16/2016   Procedure: CYSTOSCOPY;  Surgeon: Franchot Gallo, MD;  Location: [facility];  Service: Urology;  Laterality: N/A;  . IR FLUORO GUIDE PORT INSERTION RIGHT  06/18/2017  . IR US GUIDE VASC ACCESS RIGHT  06/18/2017  . TRANSURETHRAL RESECTION OF BLADDER TUMOR N/A 03/25/2016   Procedure: TRANSURETHRAL RESECTION OF BLADDER TUMOR (TURBT);  Surgeon: Franchot Gallo, MD;  Location: [facility];  Service: Urology;  Laterality: N/A;  . TRANSURETHRAL RESECTION OF BLADDER TUMOR N/A 06/16/2016   Procedure: TRANSURETHRAL RESECTION OF BLADDER TUMOR (TURBT);  Surgeon: Franchot Gallo, MD;  Location: [facility];  Service: Urology;  Laterality: N/A;  . TRANSURETHRAL  RESECTION OF BLADDER TUMOR N/A 12/16/2016   Procedure: TRANSURETHRAL RESECTION OF BLADDER TUMOR (TURBT);  Surgeon: Franchot Gallo, MD;  Location: [facility];  Service: Urology;  Laterality: N/A;  . TRANSURETHRAL RESECTION OF BLADDER TUMOR N/A 05/11/2017   Procedure: TRANSURETHRAL RESECTION OF BLADDER TUMOR (TURBT) CLOT EVACUATION;  Surgeon: Franchot Gallo, MD;  Location: [facility];  Service: Urology;  Laterality: N/A;  1 HR [phone] BLUE [id number]     SOCIAL HISTORY:  Social History   Socioeconomic History  . Marital status: Married    Spouse name: Not on file  . Number of children: Not on file  . Years of education: Not on file  . Highest education level: Not on file  Social Needs  . Financial resource strain: Not on file  . Food insecurity - worry: Not on file  . Food insecurity - inability: Not on file  . Transportation needs - medical: Not on file  . Transportation needs - non-medical: Not on file  Occupational History  . Not on file  Tobacco Use  . Smoking status: Current Every Day Smoker    Packs/day: 2.00    Years: 50.00    Pack years: 100.00    Types: Cigarettes  . Smokeless tobacco: Never Used  Substance and Sexual Activity  . Alcohol use: No    Comment: occasionally  . Drug use: No  . Sexual activity: No    Birth control/protection: None  Other Topics Concern  . Not on file  Social History Narrative  . Not on file    FAMILY HISTORY:  Family History  Problem Relation Age of Onset  . Hypertension Mother   . Asthma Father   . Emphysema Father     CURRENT MEDICATIONS:  Outpatient Encounter Medications as of 12/17/2017  Medication Sig  . CARBOPLATIN IV Inject into the vein. Day 1 every 21 days  . carvedilol (COREG) 25 MG tablet Take 25 mg by mouth 2 (two) times daily.   . citalopram (CELEXA) 20 MG tablet Take 20 mg by mouth at bedtime.   . Diphenhyd-Hydrocort-Nystatin (FIRST-DUKES MOUTHWASH) SUSP Use as directed 5 mLs in the mouth or  throat 4 (four) times daily -  before meals and at bedtime.  . fluticasone (FLONASE) 50 MCG/ACT nasal spray Place 2 sprays into both nostrils daily as needed for allergies or rhinitis.  . Gemcitabine HCl (GEMZAR IV) Inject into the vein. Day 1, day 8 every 21 days  . lidocaine (XYLOCAINE) 2 % solution Swish and swallow 82ml three to four times a day as needed for throat pain.  Marland Kitchen oxybutynin (DITROPAN) 5 MG tablet Take 1 tablet (5 mg total) by mouth every 6 (six) hours as needed for bladder spasms.  . prochlorperazine (COMPAZINE) 10 MG tablet Take 1 tablet (10 mg total) by mouth every 6 (six) hours as needed for nausea or vomiting.  . [  EXPIRED] 0.9 %  sodium chloride infusion   . [EXPIRED] gemcitabine (GEMZAR) 1,976 mg in sodium chloride 0.9 % 250 mL chemo infusion   . [EXPIRED] heparin lock flush 100 unit/mL   . prochlorperazine (COMPAZINE) tablet 10 mg   . [DISCONTINUED] sodium chloride flush (NS) 0.9 % injection 10 mL    No facility-administered encounter medications on file as of 12/17/2017.     ALLERGIES:  Allergies  Allergen Reactions  . Carboplatin Shortness Of Breath    Reaction occurred after patient left Cancer Center. Pt reported extreme sob after leaving CC that eventually resolved on its own.     PHYSICAL EXAM:  ECOG Performance status: 1 - Symptomatic; remains largely independent      Physical Exam  Constitutional: He is oriented to person, place, and time.  -Chronically ill appearing male in no acute distress Seen in chemo chair in infusion area   HENT:  Head: Normocephalic.  Mouth/Throat: Oropharynx is clear and moist.  Eyes: Conjunctivae are normal. No scleral icterus.  Neck: Normal range of motion. Neck supple.  Cardiovascular: Normal rate and regular rhythm.  Pulmonary/Chest: Effort normal. No respiratory distress. He has wheezes (Diffuse wheezes in all lung fields).  Abdominal: Soft. Bowel sounds are normal. There is no tenderness.  Musculoskeletal: Normal  range of motion. He exhibits edema (Trace ankle edema).  Lymphadenopathy:    He has no cervical adenopathy.       Right: No supraclavicular adenopathy present.       Left: No supraclavicular adenopathy present.  Neurological: He is alert and oriented to person, place, and time. No cranial nerve deficit.  Skin: Skin is warm and dry. No rash noted.  Psychiatric: Mood, memory, affect and judgment normal.  Nursing note and vitals reviewed.    LABORATORY DATA:  I have reviewed the labs as listed.  CBC    Component Value Date/Time   WBC 6.2 12/17/2017 1143   RBC 2.68 (L) 12/17/2017 1143   HGB 8.4 (L) 12/17/2017 1143   HCT 26.4 (L) 12/17/2017 1143   PLT 158 12/17/2017 1143   MCV 98.5 12/17/2017 1143   MCH 31.3 12/17/2017 1143   MCHC 31.8 12/17/2017 1143   RDW 20.0 (H) 12/17/2017 1143   LYMPHSABS 0.7 12/17/2017 1143   MONOABS 0.4 12/17/2017 1143   EOSABS 0.1 12/17/2017 1143   BASOSABS 0.0 12/17/2017 1143   CMP Latest Ref Rng & Units 12/17/2017 12/03/2017 11/20/2017  Glucose 65 - 99 mg/dL 107(H) 106(H) 111(H)  BUN 6 - 20 mg/dL 36(H) 28(H) 32(H)  Creatinine 0.61 - 1.24 mg/dL 2.20(H) 2.19(H) 2.17(H)  Sodium 135 - 145 mmol/L 141 140 139  Potassium 3.5 - 5.1 mmol/L 4.4 4.2 4.3  Chloride 101 - 111 mmol/L 108 104 105  CO2 22 - 32 mmol/L 24 24 23   Calcium 8.9 - 10.3 mg/dL 8.3(L) 8.9 8.6(L)  Total Protein 6.5 - 8.1 g/dL 6.1(L) 6.7 6.6  Total Bilirubin 0.3 - 1.2 mg/dL 0.4 0.5 0.3  Alkaline Phos 38 - 126 U/L 63 60 58  AST 15 - 41 U/L 17 19 21   ALT 17 - 63 U/L 10(L) 9(L) 15(L)     DIAGNOSTIC IMAGING:  Last restaging scans Brief summary of findings Next restaging scans due  PET scan: 11/18/17 Significant partial metabolic response; bilateral mildly hypermetabolic pulmonary metastases are decreased in size of metabolism.  No new or progressive hypermetabolic metastatic disease noted.  He does have stable hypermetabolic distal right ureteral tumor with marked right hydroureteronephrosis Due  in March  or April 2019         ASSESSMENT & PLAN:    Stage IV bladder cancer with lung mets: -Diagnosed in 01/2016. Initially treated with several TURBT procedures. Then went on to have concurrent chemoXRT with Carboplatin from 07/2016-08/2016. In 03/2017, found to have recurrent bladder cancer on CT imaging. PET scan in 05/2017 showed lung mets. Started systemic chemo with Carbo/Gemzar every 21 days on 06/29/17. After cycle #5, pt developed shortness of breath after leaving cancer center, that resolved on its own. He was concerned it was r/t Carboplatin reaction. Therefore, Carbo was discontinued from treatment plan. He continues treatment with Gemzar alone on days 1, 8, & 15 every 28 days.  -Recent restaging PET scan on 11/18/17 reviewed and showed significant partial metabolic response; bilateral mildly hypermetabolic pulmonary metastases are decreased in size of metabolism.  No new or progressive hypermetabolic metastatic disease noted.  He does have stable hypermetabolic distal right ureteral tumor with marked right hydroureteronephrosis; he follows with urology as advised. -Next restaging imaging will be due in March or April 2019; will place orders at subsequent follow-up visits.  -Return to cancer center as scheduled for next cycle of therapy.  Encounter for antineoplastic therapy:  -Labs reviewed today and are adequate for treatment.  Proceed with cycle #6, day 15 Gemzar as scheduled today.  -Current clinical concerns d/t treatment include:   *Anemia: Likely secondary to chemo. Hgb 8.4 g/dL today. He has some fatigue; denies any bleeding. Will add-on anemia panel with next cycle of therapy to ensure he has not become iron, folate, or vitamin B12 deficient.    CKD:  -BUN/CRE and GFR appear to be at his baseline.  BUN 36, CRE 2.20, and GFR 29. He does have h/o CHF, so will defer any additional IVF today. Clinically, he does not appear dehydrated. He overall feels well. Encouraged him to  drink fluids as tolerated.       Dispo:  -Return as scheduled for consideration of next cycle of chemotherapy and follow-up visit.     All questions were answered to patient's stated satisfaction. Encouraged patient to call with any new concerns or questions before his next visit to the cancer center and we can certain see him sooner, if needed.     Plan of care discussed with Dr. Sherrine Maples, who agrees with the above aforementioned.    Orders placed this encounter:  Orders Placed This Encounter  Procedures  . Vitamin B12  . Folate  . Iron and TIBC  . Ferritin      Mike Craze, NP [place] [phone]

## 2017-12-17 NOTE — Progress Notes (Signed)
Carl Murphy tolerated Gemzar infusion well without complaints or incident. Labs reviewed with and pt seen by Mike Craze NP prior to administering this medication. VSS upon discharge. Pt discharged self ambulatory in satisfactory condition

## 2017-12-18 ENCOUNTER — Encounter (HOSPITAL_COMMUNITY): Payer: Self-pay | Admitting: Adult Health

## 2017-12-22 ENCOUNTER — Ambulatory Visit (HOSPITAL_COMMUNITY): Payer: Medicare Other

## 2017-12-23 ENCOUNTER — Other Ambulatory Visit (HOSPITAL_COMMUNITY): Payer: Medicare Other

## 2017-12-31 ENCOUNTER — Ambulatory Visit (HOSPITAL_COMMUNITY): Payer: Medicare Other | Admitting: Internal Medicine

## 2017-12-31 ENCOUNTER — Encounter: Payer: Self-pay | Admitting: Oncology

## 2017-12-31 ENCOUNTER — Inpatient Hospital Stay (HOSPITAL_COMMUNITY): Payer: Medicare Other

## 2017-12-31 VITALS — BP 188/75 | HR 66 | Temp 97.8°F | Resp 18 | Wt 178.0 lb

## 2017-12-31 DIAGNOSIS — C670 Malignant neoplasm of trigone of bladder: Secondary | ICD-10-CM

## 2017-12-31 DIAGNOSIS — Z5111 Encounter for antineoplastic chemotherapy: Secondary | ICD-10-CM | POA: Diagnosis not present

## 2017-12-31 DIAGNOSIS — D649 Anemia, unspecified: Secondary | ICD-10-CM

## 2017-12-31 LAB — COMPREHENSIVE METABOLIC PANEL
ALK PHOS: 53 U/L (ref 38–126)
ALT: 11 U/L — ABNORMAL LOW (ref 17–63)
ANION GAP: 9 (ref 5–15)
AST: 17 U/L (ref 15–41)
Albumin: 2.7 g/dL — ABNORMAL LOW (ref 3.5–5.0)
BILIRUBIN TOTAL: 0.4 mg/dL (ref 0.3–1.2)
BUN: 28 mg/dL — ABNORMAL HIGH (ref 6–20)
CALCIUM: 8 mg/dL — AB (ref 8.9–10.3)
CO2: 24 mmol/L (ref 22–32)
Chloride: 108 mmol/L (ref 101–111)
Creatinine, Ser: 2.32 mg/dL — ABNORMAL HIGH (ref 0.61–1.24)
GFR, EST AFRICAN AMERICAN: 31 mL/min — AB (ref >60)
GFR, EST NON AFRICAN AMERICAN: 27 mL/min — AB (ref >60)
GLUCOSE: 99 mg/dL (ref 65–99)
Potassium: 3.9 mmol/L (ref 3.5–5.1)
Sodium: 141 mmol/L (ref 135–145)
TOTAL PROTEIN: 6 g/dL — AB (ref 6.5–8.1)

## 2017-12-31 LAB — CBC WITH DIFFERENTIAL/PLATELET
BASOS PCT: 0 %
Basophils Absolute: 0 10*3/uL (ref 0.0–0.1)
Eosinophils Absolute: 0.1 10*3/uL (ref 0.0–0.7)
Eosinophils Relative: 2 %
HEMATOCRIT: 25.5 % — AB (ref 39.0–52.0)
HEMOGLOBIN: 8 g/dL — AB (ref 13.0–17.0)
LYMPHS PCT: 15 %
Lymphs Abs: 0.8 10*3/uL (ref 0.7–4.0)
MCH: 31.4 pg (ref 26.0–34.0)
MCHC: 31.4 g/dL (ref 30.0–36.0)
MCV: 100 fL (ref 78.0–100.0)
MONOS PCT: 10 %
Monocytes Absolute: 0.5 10*3/uL (ref 0.1–1.0)
NEUTROS ABS: 4 10*3/uL (ref 1.7–7.7)
Neutrophils Relative %: 73 %
Platelets: 208 10*3/uL (ref 150–400)
RBC: 2.55 MIL/uL — ABNORMAL LOW (ref 4.22–5.81)
RDW: 19 % — ABNORMAL HIGH (ref 11.5–15.5)
WBC Morphology: INCREASED
WBC: 5.4 10*3/uL (ref 4.0–10.5)

## 2017-12-31 LAB — FOLATE: FOLATE: 8.9 ng/mL (ref >5.9)

## 2017-12-31 LAB — IRON AND TIBC
Iron: 51 ug/dL (ref 45–182)
Saturation Ratios: 22 % (ref 17.9–39.5)
TIBC: 235 ug/dL — ABNORMAL LOW (ref 250–450)
UIBC: 184 ug/dL

## 2017-12-31 LAB — FERRITIN: Ferritin: 423 ng/mL — ABNORMAL HIGH (ref 24–336)

## 2017-12-31 LAB — VITAMIN B12: VITAMIN B 12: 199 pg/mL (ref 180–914)

## 2017-12-31 MED ORDER — GEMCITABINE HCL CHEMO INJECTION 1 GM/26.3ML
1000.0000 mg/m2 | Freq: Once | INTRAVENOUS | Status: AC
  Administered 2017-12-31: 1976 mg via INTRAVENOUS
  Filled 2017-12-31: qty 51.97

## 2017-12-31 MED ORDER — PROCHLORPERAZINE MALEATE 10 MG PO TABS
10.0000 mg | ORAL_TABLET | Freq: Once | ORAL | Status: AC
  Administered 2017-12-31: 10 mg via ORAL

## 2017-12-31 MED ORDER — HEPARIN SOD (PORK) LOCK FLUSH 100 UNIT/ML IV SOLN
500.0000 [IU] | Freq: Once | INTRAVENOUS | Status: AC
  Administered 2017-12-31: 500 [IU] via INTRAVENOUS

## 2017-12-31 MED ORDER — PROCHLORPERAZINE MALEATE 10 MG PO TABS
ORAL_TABLET | ORAL | Status: AC
  Filled 2017-12-31: qty 1

## 2017-12-31 MED ORDER — HEPARIN SOD (PORK) LOCK FLUSH 100 UNIT/ML IV SOLN
INTRAVENOUS | Status: AC
  Filled 2017-12-31: qty 5

## 2017-12-31 MED ORDER — SODIUM CHLORIDE 0.9 % IV SOLN
Freq: Once | INTRAVENOUS | Status: AC
  Administered 2017-12-31: 13:00:00 via INTRAVENOUS

## 2017-12-31 NOTE — Progress Notes (Unsigned)
Labs reviewed with Faythe Casa, NP. Ok per MD to treat patient. Will continue to monitor hemoglobin as it is trending down.  Treatment given per orders. Patient tolerated it well without problems. Vitals stable and discharged home from clinic ambulatory. Follow up as scheduled.

## 2018-01-01 ENCOUNTER — Other Ambulatory Visit (HOSPITAL_COMMUNITY): Payer: Self-pay | Admitting: Pharmacist

## 2018-01-07 ENCOUNTER — Other Ambulatory Visit (HOSPITAL_COMMUNITY): Payer: Medicare Other

## 2018-01-07 ENCOUNTER — Ambulatory Visit (HOSPITAL_COMMUNITY): Payer: Medicare Other

## 2018-01-14 ENCOUNTER — Encounter (HOSPITAL_COMMUNITY): Payer: Medicare Other

## 2018-01-14 ENCOUNTER — Ambulatory Visit (HOSPITAL_COMMUNITY): Payer: Medicare Other

## 2018-01-14 ENCOUNTER — Ambulatory Visit (HOSPITAL_COMMUNITY): Payer: Medicare Other | Admitting: Internal Medicine

## 2018-01-21 ENCOUNTER — Telehealth (HOSPITAL_COMMUNITY): Payer: Self-pay

## 2018-01-21 NOTE — Telephone Encounter (Addendum)
Patient cancelled his follow up and chemotherapy appointment on 01/14/18 because he said he was sick. I have been trying to get in touch with him since then to see how he is doing. Finally reached him today. He states he has been sick with a sinus infection, cold, or maybe the flu. He is not sure. He did not go see his PCP. He states he is doing much better now and wants to start his treatments back next week . He also fell a few days ago by tripping over some clothes and hit his head on the doorfacing. He states he did not lose consciousness. Message sent to scheduling and the MD to reschedule his follow up and treatment.

## 2018-01-25 ENCOUNTER — Encounter (HOSPITAL_COMMUNITY): Payer: Self-pay

## 2018-01-25 ENCOUNTER — Encounter (HOSPITAL_COMMUNITY): Payer: Self-pay | Admitting: Lab

## 2018-01-25 ENCOUNTER — Other Ambulatory Visit: Payer: Self-pay

## 2018-01-25 ENCOUNTER — Inpatient Hospital Stay (HOSPITAL_COMMUNITY): Payer: Medicare Other | Attending: Oncology | Admitting: Internal Medicine

## 2018-01-25 ENCOUNTER — Inpatient Hospital Stay (HOSPITAL_COMMUNITY): Payer: Medicare Other

## 2018-01-25 VITALS — BP 186/74 | HR 63 | Temp 97.8°F | Resp 20 | Wt 174.0 lb

## 2018-01-25 DIAGNOSIS — J449 Chronic obstructive pulmonary disease, unspecified: Secondary | ICD-10-CM

## 2018-01-25 DIAGNOSIS — C7800 Secondary malignant neoplasm of unspecified lung: Secondary | ICD-10-CM | POA: Diagnosis not present

## 2018-01-25 DIAGNOSIS — N289 Disorder of kidney and ureter, unspecified: Secondary | ICD-10-CM | POA: Insufficient documentation

## 2018-01-25 DIAGNOSIS — I10 Essential (primary) hypertension: Secondary | ICD-10-CM | POA: Diagnosis not present

## 2018-01-25 DIAGNOSIS — Z5111 Encounter for antineoplastic chemotherapy: Secondary | ICD-10-CM | POA: Diagnosis not present

## 2018-01-25 DIAGNOSIS — D649 Anemia, unspecified: Secondary | ICD-10-CM | POA: Insufficient documentation

## 2018-01-25 DIAGNOSIS — C670 Malignant neoplasm of trigone of bladder: Secondary | ICD-10-CM | POA: Diagnosis present

## 2018-01-25 LAB — COMPREHENSIVE METABOLIC PANEL
ALK PHOS: 66 U/L (ref 38–126)
ALT: 8 U/L — AB (ref 17–63)
ANION GAP: 9 (ref 5–15)
AST: 19 U/L (ref 15–41)
Albumin: 2.7 g/dL — ABNORMAL LOW (ref 3.5–5.0)
BUN: 34 mg/dL — ABNORMAL HIGH (ref 6–20)
CALCIUM: 8.4 mg/dL — AB (ref 8.9–10.3)
CO2: 23 mmol/L (ref 22–32)
CREATININE: 2.61 mg/dL — AB (ref 0.61–1.24)
Chloride: 106 mmol/L (ref 101–111)
GFR calc Af Amer: 27 mL/min — ABNORMAL LOW (ref >60)
GFR calc non Af Amer: 23 mL/min — ABNORMAL LOW (ref >60)
Glucose, Bld: 141 mg/dL — ABNORMAL HIGH (ref 65–99)
Potassium: 3.8 mmol/L (ref 3.5–5.1)
SODIUM: 138 mmol/L (ref 135–145)
Total Bilirubin: 0.3 mg/dL (ref 0.3–1.2)
Total Protein: 6.2 g/dL — ABNORMAL LOW (ref 6.5–8.1)

## 2018-01-25 LAB — CBC WITH DIFFERENTIAL/PLATELET
BASOS PCT: 0 %
Basophils Absolute: 0 10*3/uL (ref 0.0–0.1)
EOS ABS: 0.1 10*3/uL (ref 0.0–0.7)
EOS PCT: 2 %
HCT: 24.6 % — ABNORMAL LOW (ref 39.0–52.0)
HEMOGLOBIN: 7.6 g/dL — AB (ref 13.0–17.0)
LYMPHS PCT: 10 %
Lymphs Abs: 0.6 10*3/uL — ABNORMAL LOW (ref 0.7–4.0)
MCH: 32.2 pg (ref 26.0–34.0)
MCHC: 30.9 g/dL (ref 30.0–36.0)
MCV: 104.2 fL — AB (ref 78.0–100.0)
Monocytes Absolute: 0.9 10*3/uL (ref 0.1–1.0)
Monocytes Relative: 14 %
NEUTROS ABS: 4.7 10*3/uL (ref 1.7–7.7)
Neutrophils Relative %: 74 %
Platelets: 361 10*3/uL (ref 150–400)
RBC: 2.36 MIL/uL — ABNORMAL LOW (ref 4.22–5.81)
RDW: 15.9 % — ABNORMAL HIGH (ref 11.5–15.5)
WBC: 6.3 10*3/uL (ref 4.0–10.5)

## 2018-01-25 MED ORDER — PROCHLORPERAZINE MALEATE 10 MG PO TABS
10.0000 mg | ORAL_TABLET | Freq: Once | ORAL | Status: AC
  Administered 2018-01-25: 10 mg via ORAL

## 2018-01-25 MED ORDER — HEPARIN SOD (PORK) LOCK FLUSH 100 UNIT/ML IV SOLN
500.0000 [IU] | Freq: Once | INTRAVENOUS | Status: AC | PRN
  Administered 2018-01-25: 500 [IU]
  Filled 2018-01-25: qty 5

## 2018-01-25 MED ORDER — SODIUM CHLORIDE 0.9 % IV SOLN
Freq: Once | INTRAVENOUS | Status: AC
  Administered 2018-01-25: 11:00:00 via INTRAVENOUS

## 2018-01-25 MED ORDER — SODIUM CHLORIDE 0.9% FLUSH
10.0000 mL | INTRAVENOUS | Status: DC | PRN
  Administered 2018-01-25: 10 mL
  Filled 2018-01-25: qty 10

## 2018-01-25 MED ORDER — PROCHLORPERAZINE MALEATE 10 MG PO TABS
ORAL_TABLET | ORAL | Status: AC
  Filled 2018-01-25: qty 1

## 2018-01-25 MED ORDER — SODIUM CHLORIDE 0.9 % IV SOLN
1000.0000 mg/m2 | Freq: Once | INTRAVENOUS | Status: AC
  Administered 2018-01-25: 1976 mg via INTRAVENOUS
  Filled 2018-01-25: qty 51.97

## 2018-01-25 NOTE — Patient Instructions (Addendum)
[place] at [facility] Discharge Instructions  RECOMMENDATIONS MADE BY THE CONSULTANT AND ANY TEST RESULTS WILL BE SENT TO YOUR REFERRING PHYSICIAN.  You were seen today by Dr. Zoila Shutter Your lab counts today are a little low, but we will proceed with treatment Your kidney functions are a little higher than normal, so we will refer you to a kidney doctor (Nephrologist) You will have scans scheduled in April. Continue treatments every 2 weeks. We will get you scheduled with the new physician in April after scans  For your sinus issues, you can take Zyrtec to help dry it up, or mucinex to help with the congestion.      Thank you for choosing [place] at [facility] to provide your oncology and hematology care.  To afford each patient quality time with our provider, please arrive at least 15 minutes before your scheduled appointment time.    If you have a lab appointment with the [facility] please come in thru the  Main Entrance and check in at the main information desk  You need to re-schedule your appointment should you arrive 10 or more minutes late.  We strive to give you quality time with our providers, and arriving late affects you and other patients whose appointments are after yours.  Also, if you no show three or more times for appointments you may be dismissed from the clinic at the providers discretion.     Again, thank you for choosing [facility].  Our hope is that these requests will decrease the amount of time that you wait before being seen by our physicians.       _____________________________________________________________  Should you have questions after your visit to [facility], please contact our office at [phone] between the hours of 8:30 a.m. and 4:30 p.m.  Voicemails left after 4:30 p.m. will not be returned until the following business day.  For prescription refill requests, have  your pharmacy contact our office.       Resources For Cancer Patients and their Caregivers ? American Cancer Society: Can assist with transportation, wigs, general needs, runs Look Good Feel Better.        (603)221-5207 ? Cancer Care: Provides financial assistance, online support groups, medication/co-pay assistance.  1-800-813-HOPE 818-660-1210) ? [facility] Assists [place] cancer patients and their families through emotional , educational and financial support.  202-596-8639 ? [place] DSS Where to apply for food stamps, Medicaid and utility assistance. 201-252-4231 ? RCATS: Transportation to medical appointments. (534) 706-1448 ? Social Security Administration: May apply for disability if have a Stage IV cancer. ([phone] [phone] ? [employer], Disability and Transit Services: Assists with nutrition, care and transit needs. [place] Support Programs: @10RELATIVEDAYS @ > Cancer Support Group  2nd Tuesday of the month 1pm-2pm, Journey Room  > Creative Journey  3rd Tuesday of the month 1130am-1pm, Journey Room  > Look Good Feel Better  1st Wednesday of the month 10am-12 noon, Journey Room (Call [place] to register [phone])

## 2018-01-25 NOTE — Progress Notes (Signed)
[place] reviewed with and pt seen by Dr. Walden Field and pt approved for chemo tx today per MD                  Fayrene Fearing tolerated Gemzar infusion well without complaints or incident. VSS upon discharge. Pt discharged self ambulatory in satisfactory condition

## 2018-01-25 NOTE — Patient Instructions (Signed)
[facility] Discharge Instructions for Patients Receiving Chemotherapy   Beginning January 23rd 2017 lab work for the Cancer Center will be done in the  Main lab at [facility] on 1st floor. If you have a lab appointment with the Cancer Center please come in thru the  Main Entrance and check in at the main information desk   Today you received the following chemotherapy agents Gemzar. Follow-up as scheduled. Call clinic for any questions or concerns  To help prevent nausea and vomiting after your treatment, we encourage you to take your nausea medication   If you develop nausea and vomiting, or diarrhea that is not controlled by your medication, call the clinic.  The clinic phone number is [phone]. Office hours are Monday-Friday 8:30am-5:00pm.  BELOW ARE SYMPTOMS THAT SHOULD BE REPORTED IMMEDIATELY:  *FEVER GREATER THAN 101.0 F  *CHILLS WITH OR WITHOUT FEVER  NAUSEA AND VOMITING THAT IS NOT CONTROLLED WITH YOUR NAUSEA MEDICATION  *UNUSUAL SHORTNESS OF BREATH  *UNUSUAL BRUISING OR BLEEDING  TENDERNESS IN MOUTH AND THROAT WITH OR WITHOUT PRESENCE OF ULCERS  *URINARY PROBLEMS  *BOWEL PROBLEMS  UNUSUAL RASH Items with * indicate a potential emergency and should be followed up as soon as possible. If you have an emergency after office hours please contact your primary care physician or go to the nearest emergency department.  Please call the clinic during office hours if you have any questions or concerns.   You may also contact the Patient Navigator at [phone] should you have any questions or need assistance in obtaining follow up care.      Resources For Cancer Patients and their Caregivers ? American Cancer Society: Can assist with transportation, wigs, general needs, runs Look Good Feel Better.        1-888-227-6333 ? Cancer Care: Provides financial assistance, online support groups, medication/co-pay assistance.  1-800-813-HOPE  (4673) ? [facility] Assists [place] cancer patients and their families through emotional , educational and financial support.  336-427-4357 ? [place] DSS Where to apply for food stamps, Medicaid and utility assistance. 336-342-1394 ? RCATS: Transportation to medical appointments. 336-347-2287 ? Social Security Administration: May apply for disability if have a Stage IV cancer. 336-342-7796 1-800-772-1213 ? [place] Aging, Disability and Transit Services: Assists with nutrition, care and transit needs. 336-349-2343         

## 2018-01-25 NOTE — Progress Notes (Unsigned)
Referral to Dr Lowanda Foster records faxed on 2/25

## 2018-02-02 NOTE — Progress Notes (Signed)
Diagnosis Malignant neoplasm of trigone of bladder (HCC) - Plan: NM PET Image Restag (PS) Skull Base To Thigh, CBC with Differential/Platelet, Comprehensive metabolic panel, Lactate dehydrogenase, DISCONTINUED: 0.9 %  sodium chloride infusion, DISCONTINUED: sodium chloride flush (NS) 0.9 % injection 10 mL, DISCONTINUED: heparin lock flush 100 unit/mL, DISCONTINUED: prochlorperazine (COMPAZINE) tablet 10 mg, DISCONTINUED: gemcitabine (GEMZAR) 1,976 mg in sodium chloride 0.9 % 100 mL chemo infusion  Staging Cancer Staging Malignant neoplasm of trigone of bladder (HCC) Staging form: Urinary Bladder, AJCC 7th Edition - Clinical stage from 05/15/2016: Stage II (T2, N0, M0) - Signed by Baird Cancer, PA-C on 08/11/2016 - Pathologic stage from 06/29/2017: Stage IV (T2a, N0, M1) - Signed by Baird Cancer, PA-C on 06/29/2017   Assessment and Plan:  1.  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 He now has Stage IV disease.  Pt completed 6 cycles of carbo/gemzar on 11/12/17, carboplatin was omitted from cycle 6 due to previous reaction with cycle 5 carboplatin with SOB during treatment.  PET CT done 11/18/2017 showed significant partial metabolic response. Bilateral mildly hypermetabolic pulmonary metastases are decreased in size and metabolism. No new or progressive hypermetabolic metastatic disease. Stable hypermetabolic distal right ureteral tumor with marked right hydroureteronephrosis.  He is now on maintenance gemzar under the direction of Dr. Talbert Cage beginning December 03, 2017.  He is being treated D1 and day 15 out of 28 day cycle due to previous history of significant cytopenias with chemo and chemo-related fatigue.   He is here for evaluation prior to Gemzar.  Labs adequate for chemotherapy.  He will return to clinic in 2 weeks for day 15.  He will be set up for PET scan in April 2019 and will  follow-up to go over the results.   2.  Renal insufficiency.  Creatinine is noted to be 2.6.  He has known severe right hydronephrosis due to recurrent bladder carcinoma with synchronous disease in the distal right ureter   He will be referred to nephrology for evaluation.  3.  Anemia.  Hemoglobin 7.6.  He has evidence of renal insufficiency.  We will continue to monitor labs as therapy proceeds.  We will transfuse if hemoglobin decreases less than 7.    4.  COPD.  Smoking cessation is recommended.  5.  Hypertension.  BP is noted to be 186/74.  Continue to follow-up with PCP.    Current Status:  Pt is seen today prior to Gemzar.      Oncology History   Per available records had bladder tumor removed but had extension into the muscularis propria. CT showed no extension beyond the bladder. Has seen 2 urologists in consultation. [place] about not having a cystectomy and ileal conduit.     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 No findings for local spread of disease or metastatic disease      03/25/2016 Surgery    TURBT 3cm right trigonal bladder tumor. followed by intravesical epirubicin instillation      03/25/2016 Pathology Results    bladder TURBT infiltrative high grade papillary urothelial carcinoma, invades the mulcularis propria (detrusor) no LVI       04/14/2016 Miscellaneous    [name] consultation in [place] for consideration of cystectomy. patient also offered repeat resection with BCG, and CHEMO/XRT  06/17/2016 Pathology Results    TURB- high grade papillary urothelial carcinoma with focal stromal invasion       Radiation Therapy    Planned to be completed on 09/10/2016.      07/22/2016 -  Chemotherapy    The patient had palonosetron (ALOXI) injection 0.25 mg, 0.25 mg, Intravenous,  Once, 2 of 5 cycles Administration: 0.25 mg (07/28/2016)  CARBOplatin  (PARAPLATIN) 160 mg in sodium chloride 0.9 % 100 mL chemo infusion, 160 mg (100 % of original dose 160.6 mg), Intravenous,  Once, 2 of 5 cycles Dose modification:   (original dose 160.6 mg, Cycle 1),   (original dose 160.6 mg, Cycle 1),   (original dose 160.6 mg, Cycle 1),   (original dose 160.6 mg, Cycle 2) Administration: 180 mg (07/28/2016)  for chemotherapy treatment.        04/07/2017 Relapse/Recurrence    CT Abd/pelvis: IMPRESSION: 1. Recurrent bladder carcinoma with synchronous disease in the distal right ureter causing severe right hydronephrosis and decreased right renal function. 2. Bilateral renal stones. 3. Aortic atherosclerosis (ICD10-170.0). Coronary artery calcification. 4. Small periumbilical hernia contains a knuckle of unobstructed small bowel.       06/10/2017 PET scan    1. Metachronous urothelial carcinoma within the distal right ureter, as on prior CT. 2. No evidence of nodal metastasis. 3. Bilateral hypermetabolic pulmonary nodules, favoring pulmonary metastasis. Especially given emphysema, 1 or more primary bronchogenic carcinomas cannot be excluded but are felt less likely. 4. Coronary artery atherosclerosis. Aortic Atherosclerosis (ICD10-I70.0). 5. Bladder not well evaluated secondary to hypermetabolic urine within.       06/18/2017 Procedure    Placement of single lumen port a cath via right internal jugular vein. The catheter tip lies at the cavo-atrial junction. A power injectable port a cath was placed and is ready for immediate use.      06/29/2017 -  Chemotherapy    Carboplatin/Gemcitabine every 21 days       08/14/2017 Imaging    CT C/A/P IMPRESSION: 1. Overall stable pulmonary nodules. Knee soft tissue rim around the cavitary lesion in the left upper lobe is slightly thinner. No new pulmonary nodules or progressive findings in the chest. 2. Stable advanced emphysematous changes and pulmonary scarring. 3. No mediastinal or hilar mass or  lymphadenopathy. 4. Improved CT appearance of the bladder. The asymmetric areas of bladder wall enhancement have improved. There is still diffuse irregular bladder wall thickening and distal right ureteral tumor appears stable. Stable right-sided hydroureteronephrosis. 5. Stable cirrhotic changes involving the liver and stable liver cysts. 6. Stable scattered mesenteric and retroperitoneal and pelvic lymph nodes but no mass or overt adenopathy. 7. Stable severe/advanced atherosclerotic calcifications involving the thoracic and abdominal aorta and branch vessels.      11/18/2017 Imaging    PET CT: IMPRESSION: 1. Significant partial metabolic response. Bilateral mildly hypermetabolic pulmonary metastases are decreased in size and metabolism. No new or progressive hypermetabolic metastatic disease. 2. Stable hypermetabolic distal right ureteral tumor with marked right hydroureteronephrosis. 3. Chronic findings include: Aortic Atherosclerosis (ICD10-I70.0) and Emphysema (ICD10-J43.9). Three-vessel coronary atherosclerosis. Stable small fat containing umbilical hernia.      Problem List Patient Active Problem List   Diagnosis Date Noted  . Productive cough [R05] 07/24/2017  . Mucositis oral [K12.30] 07/21/2017  . Acute blood loss anemia [D62] 05/07/2017  . Gross hematuria [R31.0] 05/07/2017  . AKI (acute kidney injury) ([place]) [N17.9] 05/07/2017  . COPD (chronic obstructive pulmonary disease) ([place]) [J44.9] 05/07/2017  . Tobacco dependence [F17.200]  05/07/2017  . Tobacco abuse [Z72.0] 05/15/2016  . Malignant neoplasm of trigone of bladder ([place]) [C67.0] 03/25/2016    Past Medical History Past Medical History:  Diagnosis Date  . Arthritis   . Cancer [facility])    Bladder Cancer  . CHF (congestive heart failure) ([place])   . COPD (chronic obstructive pulmonary disease) ([place])   . Depression   . Hard of hearing    bilat   . Hypertension   . Malignant neoplasm of trigone of bladder  ([place]) 03/25/2016  . PONV (postoperative nausea and vomiting)   . Shortness of breath dyspnea    heat; walking up set of stairs; pt states can walk up 12-14 stairs slowly without having to stop to catch his breath    Past Surgical History Past Surgical History:  Procedure Laterality Date  . CYSTOSCOPY N/A 06/16/2016   Procedure: CYSTOSCOPY;  Surgeon: Franchot Gallo, MD;  Location: [facility];  Service: Urology;  Laterality: N/A;  . IR FLUORO GUIDE PORT INSERTION RIGHT  06/18/2017  . IR US GUIDE VASC ACCESS RIGHT  06/18/2017  . TRANSURETHRAL RESECTION OF BLADDER TUMOR N/A 03/25/2016   Procedure: TRANSURETHRAL RESECTION OF BLADDER TUMOR (TURBT);  Surgeon: Franchot Gallo, MD;  Location: [facility];  Service: Urology;  Laterality: N/A;  . TRANSURETHRAL RESECTION OF BLADDER TUMOR N/A 06/16/2016   Procedure: TRANSURETHRAL RESECTION OF BLADDER TUMOR (TURBT);  Surgeon: Franchot Gallo, MD;  Location: [facility];  Service: Urology;  Laterality: N/A;  . TRANSURETHRAL RESECTION OF BLADDER TUMOR N/A 12/16/2016   Procedure: TRANSURETHRAL RESECTION OF BLADDER TUMOR (TURBT);  Surgeon: Franchot Gallo, MD;  Location: [facility];  Service: Urology;  Laterality: N/A;  . TRANSURETHRAL RESECTION OF BLADDER TUMOR N/A 05/11/2017   Procedure: TRANSURETHRAL RESECTION OF BLADDER TUMOR (TURBT) CLOT EVACUATION;  Surgeon: Franchot Gallo, MD;  Location: [facility];  Service: Urology;  Laterality: N/A;  1 HR [phone] BLUE MEDICARE-[id number]    Family History Family History  Problem Relation Age of Onset  . Hypertension Mother   . Asthma Father   . Emphysema Father      Social History  reports that he has been smoking cigarettes.  He has a 100.00 pack-year smoking history. he has never used smokeless tobacco. He reports that he does not drink alcohol or use drugs.  Medications  Current Outpatient Medications:  .  carvedilol (COREG) 25 MG tablet, Take 25 mg by mouth 2 (two) times daily. , Disp: , Rfl: 11 .   cetirizine (ZYRTEC) 10 MG tablet, Take 10 mg by mouth daily., Disp: , Rfl:  .  citalopram (CELEXA) 20 MG tablet, Take 20 mg by mouth at bedtime. , Disp: , Rfl: 11 .  Diphenhyd-Hydrocort-Nystatin (FIRST-DUKES MOUTHWASH) SUSP, Use as directed 5 mLs in the mouth or throat 4 (four) times daily -  before meals and at bedtime. (Patient not taking: Reported on 12/31/2017), Disp: 480 mL, Rfl: 1 .  fluticasone (FLONASE) 50 MCG/ACT nasal spray, Place 2 sprays into both nostrils daily as needed for allergies or rhinitis., Disp: , Rfl:  .  Gemcitabine HCl (GEMZAR IV), Inject into the vein. Day 1, day 8 every 21 days, Disp: , Rfl:  .  lidocaine (XYLOCAINE) 2 % solution, Swish and swallow 26m three to four times a day as needed for throat pain. (Patient not taking: Reported on 12/31/2017), Disp: 480 mL, Rfl: 2 .  oxybutynin (DITROPAN) 5 MG tablet, Take 1 tablet (5 mg total) by mouth every 6 (six) hours as needed for bladder spasms., Disp: 20  tablet, Rfl: 0 .  prochlorperazine (COMPAZINE) 10 MG tablet, Take 1 tablet (10 mg total) by mouth every 6 (six) hours as needed for nausea or vomiting., Disp: 30 tablet, Rfl: 0  Allergies Carboplatin  Review of Systems Review of Systems - Oncology ROS as per HPI otherwise 12 point ROS is negative.   Physical Exam  Vitals Wt Readings from Last 3 Encounters:  01/25/18 174 lb (78.9 kg)  12/31/17 178 lb (80.7 kg)  12/17/17 180 lb 9.6 oz (81.9 kg)   Temp Readings from Last 3 Encounters:  01/25/18 97.8 F (36.6 C) (Oral)  12/31/17 97.8 F (36.6 C)  12/17/17 (!) 97.1 F (36.2 C) (Oral)   BP Readings from Last 3 Encounters:  01/25/18 (!) 186/74  12/31/17 (!) 188/75  12/17/17 (!) 189/72   Pulse Readings from Last 3 Encounters:  01/25/18 63  12/31/17 66  12/17/17 63   Constitutional: Well-developed, well-nourished, and in no distress.   HENT: Head: Normocephalic and atraumatic.  Mouth/Throat: No oropharyngeal exudate. Mucosa moist. Eyes: Pupils are equal,  round, and reactive to light. Conjunctivae are normal. No scleral icterus.  Neck: Normal range of motion. Neck supple. No JVD present.  Cardiovascular: Normal rate, regular rhythm and normal heart sounds.  Exam reveals no gallop and no friction rub.   No murmur heard. Pulmonary/Chest: Effort normal and breath sounds normal. No respiratory distress. No wheezes.No rales.  Abdominal: Soft. Bowel sounds are normal. No distension. There is no tenderness. There is no guarding. Umbilical hernia noted Musculoskeletal: No edema or tenderness.  Lymphadenopathy: No cervical, axillary or supraclavicular adenopathy.  Neurological: Alert and oriented to person, place, and time. No cranial nerve deficit.  Skin: Skin is warm and dry. No rash noted. No erythema. No pallor.  Psychiatric: Affect and judgment normal.   Labs Infusion on 01/25/2018  Component Date Value Ref Range Status  . Sodium 01/25/2018 138  135 - 145 mmol/L Final  . Potassium 01/25/2018 3.8  3.5 - 5.1 mmol/L Final  . Chloride 01/25/2018 106  101 - 111 mmol/L Final  . CO2 01/25/2018 23  22 - 32 mmol/L Final  . Glucose, Bld 01/25/2018 141* 65 - 99 mg/dL Final  . BUN 01/25/2018 34* 6 - 20 mg/dL Final  . Creatinine, Ser 01/25/2018 2.61* 0.61 - 1.24 mg/dL Final  . Calcium 01/25/2018 8.4* 8.9 - 10.3 mg/dL Final  . Total Protein 01/25/2018 6.2* 6.5 - 8.1 g/dL Final  . Albumin 01/25/2018 2.7* 3.5 - 5.0 g/dL Final  . AST 01/25/2018 19  15 - 41 U/L Final  . ALT 01/25/2018 8* 17 - 63 U/L Final  . Alkaline Phosphatase 01/25/2018 66  38 - 126 U/L Final  . Total Bilirubin 01/25/2018 0.3  0.3 - 1.2 mg/dL Final  . GFR calc non Af Amer 01/25/2018 23* >60 mL/min Final  . GFR calc Af Amer 01/25/2018 27* >60 mL/min Final   Comment: (NOTE) The eGFR has been calculated using the CKD EPI equation. This calculation has not been validated in all clinical situations. eGFR's persistently <60 mL/min signify possible Chronic Kidney Disease.   Georgiann Hahn gap  01/25/2018 9  5 - 15 Final   Performed at [facility], [address] [place], [place] [address]  . WBC 01/25/2018 6.3  4.0 - 10.5 K/uL Final  . RBC 01/25/2018 2.36* 4.22 - 5.81 MIL/uL Final  . Hemoglobin 01/25/2018 7.6* 13.0 - 17.0 g/dL Final  . HCT 01/25/2018 24.6* 39.0 - 52.0 % Final  . MCV 01/25/2018 104.2*  78.0 - 100.0 fL Final  . MCH 01/25/2018 32.2  26.0 - 34.0 pg Final  . MCHC 01/25/2018 30.9  30.0 - 36.0 g/dL Final  . RDW 01/25/2018 15.9* 11.5 - 15.5 % Final  . Platelets 01/25/2018 361  150 - 400 K/uL Final  . Neutrophils Relative % 01/25/2018 74  % Final  . Lymphocytes Relative 01/25/2018 10  % Final  . Monocytes Relative 01/25/2018 14  % Final  . Eosinophils Relative 01/25/2018 2  % Final  . Basophils Relative 01/25/2018 0  % Final  . Neutro Abs 01/25/2018 4.7  1.7 - 7.7 K/uL Final  . Lymphs Abs 01/25/2018 0.6* 0.7 - 4.0 K/uL Final  . Monocytes Absolute 01/25/2018 0.9  0.1 - 1.0 K/uL Final  . Eosinophils Absolute 01/25/2018 0.1  0.0 - 0.7 K/uL Final  . Basophils Absolute 01/25/2018 0.0  0.0 - 0.1 K/uL Final  . Smear Review 01/25/2018 MORPHOLOGY UNREMARKABLE   Final   Performed at [facility], [address], [place], [place] [address]     Pathology Orders Placed This Encounter  Procedures  . NM PET Image Restag (PS) Skull Base To Thigh    Standing Status:   Future    Standing Expiration Date:   01/25/2019    Order Specific Question:   If indicated for the ordered procedure, I authorize the administration of a radiopharmaceutical per Radiology protocol    Answer:   Yes    Order Specific Question:   Preferred imaging location?    Answer:   [facility]    Order Specific Question:   Radiology Contrast Protocol - do NOT remove file path    Answer:   [email or web address]  . CBC with Differential/Platelet    Standing Status:   Future    Standing Expiration Date:   01/25/2019  . Comprehensive metabolic panel    Standing Status:   Future     Standing Expiration Date:   01/25/2019  . Lactate dehydrogenase    Standing Status:   Future    Standing Expiration Date:   01/25/2019    Pet scan done 11/18/2017:  IMPRESSION: 1. Significant partial metabolic response. Bilateral mildly hypermetabolic pulmonary metastases are decreased in size and metabolism. No new or progressive hypermetabolic metastatic disease. 2. Stable hypermetabolic distal right ureteral tumor with marked right hydroureteronephrosis. 3. Chronic findings include: Aortic Atherosclerosis (ICD10-I70.0) and Emphysema (ICD10-J43.9). Three-vessel coronary atherosclerosis. Stable small fat containing umbilical hernia.   Zoila Shutter MD

## 2018-02-08 ENCOUNTER — Ambulatory Visit (HOSPITAL_COMMUNITY): Payer: Medicare Other

## 2018-02-22 ENCOUNTER — Inpatient Hospital Stay (HOSPITAL_COMMUNITY): Payer: Medicare Other

## 2018-02-22 ENCOUNTER — Other Ambulatory Visit: Payer: Self-pay

## 2018-02-22 ENCOUNTER — Encounter (HOSPITAL_COMMUNITY): Payer: Self-pay | Admitting: Hematology

## 2018-02-22 ENCOUNTER — Inpatient Hospital Stay (HOSPITAL_COMMUNITY): Payer: Medicare Other | Attending: Oncology | Admitting: Hematology

## 2018-02-22 VITALS — BP 149/55 | HR 73 | Temp 98.5°F | Resp 20 | Wt 171.6 lb

## 2018-02-22 DIAGNOSIS — C670 Malignant neoplasm of trigone of bladder: Secondary | ICD-10-CM

## 2018-02-22 DIAGNOSIS — Z720 Tobacco use: Secondary | ICD-10-CM | POA: Insufficient documentation

## 2018-02-22 DIAGNOSIS — N189 Chronic kidney disease, unspecified: Secondary | ICD-10-CM

## 2018-02-22 DIAGNOSIS — N1330 Unspecified hydronephrosis: Secondary | ICD-10-CM | POA: Insufficient documentation

## 2018-02-22 DIAGNOSIS — N179 Acute kidney failure, unspecified: Secondary | ICD-10-CM

## 2018-02-22 DIAGNOSIS — C7800 Secondary malignant neoplasm of unspecified lung: Secondary | ICD-10-CM | POA: Insufficient documentation

## 2018-02-22 DIAGNOSIS — D649 Anemia, unspecified: Secondary | ICD-10-CM | POA: Insufficient documentation

## 2018-02-22 LAB — CBC WITH DIFFERENTIAL/PLATELET
BASOS PCT: 0 %
Basophils Absolute: 0 10*3/uL (ref 0.0–0.1)
EOS ABS: 0.1 10*3/uL (ref 0.0–0.7)
Eosinophils Relative: 1 %
HEMATOCRIT: 23.3 % — AB (ref 39.0–52.0)
HEMOGLOBIN: 7.5 g/dL — AB (ref 13.0–17.0)
LYMPHS ABS: 0.4 10*3/uL — AB (ref 0.7–4.0)
Lymphocytes Relative: 4 %
MCH: 32.9 pg (ref 26.0–34.0)
MCHC: 32.2 g/dL (ref 30.0–36.0)
MCV: 102.2 fL — ABNORMAL HIGH (ref 78.0–100.0)
MONO ABS: 1.4 10*3/uL — AB (ref 0.1–1.0)
Monocytes Relative: 16 %
NEUTROS ABS: 7.3 10*3/uL (ref 1.7–7.7)
Neutrophils Relative %: 79 %
Platelets: 231 10*3/uL (ref 150–400)
RBC: 2.28 MIL/uL — ABNORMAL LOW (ref 4.22–5.81)
RDW: 15.1 % (ref 11.5–15.5)
WBC: 9.2 10*3/uL (ref 4.0–10.5)

## 2018-02-22 LAB — COMPREHENSIVE METABOLIC PANEL
ALBUMIN: 2.5 g/dL — AB (ref 3.5–5.0)
ALK PHOS: 60 U/L (ref 38–126)
ALT: 7 U/L — AB (ref 17–63)
AST: 15 U/L (ref 15–41)
Anion gap: 11 (ref 5–15)
BUN: 36 mg/dL — ABNORMAL HIGH (ref 6–20)
CALCIUM: 7.9 mg/dL — AB (ref 8.9–10.3)
CHLORIDE: 104 mmol/L (ref 101–111)
CO2: 22 mmol/L (ref 22–32)
CREATININE: 3.3 mg/dL — AB (ref 0.61–1.24)
GFR calc Af Amer: 20 mL/min — ABNORMAL LOW (ref >60)
GFR calc non Af Amer: 18 mL/min — ABNORMAL LOW (ref >60)
GLUCOSE: 107 mg/dL — AB (ref 65–99)
Potassium: 3.3 mmol/L — ABNORMAL LOW (ref 3.5–5.1)
SODIUM: 137 mmol/L (ref 135–145)
Total Bilirubin: 0.5 mg/dL (ref 0.3–1.2)
Total Protein: 6 g/dL — ABNORMAL LOW (ref 6.5–8.1)

## 2018-02-22 LAB — LACTATE DEHYDROGENASE: LDH: 164 U/L (ref 98–192)

## 2018-02-22 MED ORDER — HEPARIN SOD (PORK) LOCK FLUSH 100 UNIT/ML IV SOLN
500.0000 [IU] | Freq: Once | INTRAVENOUS | Status: AC
  Administered 2018-02-22: 500 [IU] via INTRAVENOUS

## 2018-02-22 NOTE — Progress Notes (Signed)
Pt assessed and examined during office visit today by Dr. [name].  Per MD, will hold tx today. Port a cath deaccessed and pt d/c ambulatory in c/o daughter.

## 2018-02-22 NOTE — Progress Notes (Signed)
[name], [name] [address]   CLINIC:  Medical Oncology/Hematology  PCP:  [name], [address] [name] [address] [phone]   REASON FOR VISIT: Follow-up for metastatic bladder cancer.  CURRENT THERAPY: Gemcitabine day 1 and day 15 every 28 days BRIEF ONCOLOGIC HISTORY:  Oncology History   Per available records had bladder tumor removed but had extension into the muscularis propria. CT showed no extension beyond the bladder. Has seen 2 urologists in consultation. Adamant about not having a cystectomy and ileal conduit.     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 No findings for local spread of disease or metastatic disease      03/25/2016 Surgery    TURBT 3cm right trigonal bladder tumor. followed by intravesical epirubicin instillation      03/25/2016 Pathology Results    bladder TURBT infiltrative high grade papillary urothelial carcinoma, invades the mulcularis propria (detrusor) no LVI       04/14/2016 Miscellaneous    [name] consultation in [place] for consideration of cystectomy. patient also offered repeat resection with BCG, and CHEMO/XRT      06/17/2016 Pathology Results    TURB- high grade papillary urothelial carcinoma with focal stromal invasion       Radiation Therapy    Planned to be completed on 09/10/2016.      07/22/2016 -  Chemotherapy    The patient had palonosetron (ALOXI) injection 0.25 mg, 0.25 mg, Intravenous,  Once, 2 of 5 cycles Administration: 0.25 mg (07/28/2016)  CARBOplatin (PARAPLATIN) 160 mg in sodium chloride 0.9 % 100 mL chemo infusion, 160 mg (100 % of original dose 160.6 mg), Intravenous,  Once, 2 of 5 cycles Dose modification:   (original dose 160.6 mg, Cycle 1),   (original dose 160.6 mg, Cycle 1),   (original dose 160.6 mg, Cycle 1),   (original dose 160.6 mg, Cycle  2) Administration: 180 mg (07/28/2016)  for chemotherapy treatment.        04/07/2017 Relapse/Recurrence    CT Abd/pelvis: IMPRESSION: 1. Recurrent bladder carcinoma with synchronous disease in the distal right ureter causing severe right hydronephrosis and decreased right renal function. 2. Bilateral renal stones. 3. Aortic atherosclerosis (ICD10-170.0). Coronary artery calcification. 4. Small periumbilical hernia contains a knuckle of unobstructed small bowel.       06/10/2017 PET scan    1. Metachronous urothelial carcinoma within the distal right ureter, as on prior CT. 2. No evidence of nodal metastasis. 3. Bilateral hypermetabolic pulmonary nodules, favoring pulmonary metastasis. Especially given emphysema, 1 or more primary bronchogenic carcinomas cannot be excluded but are felt less likely. 4. Coronary artery atherosclerosis. Aortic Atherosclerosis (ICD10-I70.0). 5. Bladder not well evaluated secondary to hypermetabolic urine within.       06/18/2017 Procedure    Placement of single lumen port a cath via right internal jugular vein. The catheter tip lies at the cavo-atrial junction. A power injectable port a cath was placed and is ready for immediate use.      06/29/2017 -  Chemotherapy    Carboplatin/Gemcitabine every 21 days       08/14/2017 Imaging    CT C/A/P IMPRESSION: 1. Overall stable pulmonary nodules. Knee soft tissue rim around the cavitary lesion in the left upper lobe is slightly thinner. No new pulmonary nodules or progressive findings in the chest. 2. Stable advanced emphysematous  changes and pulmonary scarring. 3. No mediastinal or hilar mass or lymphadenopathy. 4. Improved CT appearance of the bladder. The asymmetric areas of bladder wall enhancement have improved. There is still diffuse irregular bladder wall thickening and distal right ureteral tumor appears stable. Stable right-sided hydroureteronephrosis. 5. Stable cirrhotic changes  involving the liver and stable liver cysts. 6. Stable scattered mesenteric and retroperitoneal and pelvic lymph nodes but no mass or overt adenopathy. 7. Stable severe/advanced atherosclerotic calcifications involving the thoracic and abdominal aorta and branch vessels.      11/18/2017 Imaging    PET CT: IMPRESSION: 1. Significant partial metabolic response. Bilateral mildly hypermetabolic pulmonary metastases are decreased in size and metabolism. No new or progressive hypermetabolic metastatic disease. 2. Stable hypermetabolic distal right ureteral tumor with marked right hydroureteronephrosis. 3. Chronic findings include: Aortic Atherosclerosis (ICD10-I70.0) and Emphysema (ICD10-J43.9). Three-vessel coronary atherosclerosis. Stable small fat containing umbilical hernia.        CANCER STAGING: Cancer Staging Malignant neoplasm of trigone of bladder ([place]) Staging form: Urinary Bladder, AJCC 7th Edition - Clinical stage from 05/15/2016: Stage II (T2, N0, M0) - Signed by Baird Cancer, PA-C on 08/11/2016 - Pathologic stage from 06/29/2017: Stage IV (T2a, N0, M1) - Signed by Baird Cancer, PA-C on 06/29/2017    INTERVAL HISTORY:  Carl Murphy 72 y.o. male returns for routine follow-up and consideration for next cycle of chemotherapy.   He has not gotten his gemcitabine in the last 4 weeks.  He tells me that he has been sick since he got his last chemotherapy.  He also complains of decreased appetite.  He is accompanied by his daughter.  He also noted occasional blood in the urine, pinkish in color for the last 4-6 weeks.  Occasional clots were present but smaller than before.  He has not had hematuria after he completed 6 cycles of carboplatin and gemcitabine.   REVIEW OF SYSTEMS:  Review of Systems  Constitutional: Positive for fatigue.  HENT:  Negative.   Eyes: Negative.   Respiratory: Positive for shortness of breath.   Gastrointestinal: Positive for constipation,  diarrhea, nausea and vomiting.  Genitourinary: Positive for hematuria.   Musculoskeletal: Negative.   Skin: Negative.   Neurological: Positive for numbness.  Psychiatric/Behavioral: Negative.      PAST MEDICAL/SURGICAL HISTORY:  Past Medical History:  Diagnosis Date  . Arthritis   . Cancer [facility])    Bladder Cancer  . CHF (congestive heart failure) ([place])   . COPD (chronic obstructive pulmonary disease) ([place])   . Depression   . Hard of hearing    bilat   . Hypertension   . Malignant neoplasm of trigone of bladder ([place]) 03/25/2016  . PONV (postoperative nausea and vomiting)   . Shortness of breath dyspnea    heat; walking up set of stairs; pt states can walk up 12-14 stairs slowly without having to stop to catch his breath   Past Surgical History:  Procedure Laterality Date  . CYSTOSCOPY N/A 06/16/2016   Procedure: CYSTOSCOPY;  Surgeon: Franchot Gallo, MD;  Location: [facility];  Service: Urology;  Laterality: N/A;  . IR FLUORO GUIDE PORT INSERTION RIGHT  06/18/2017  . IR US GUIDE VASC ACCESS RIGHT  06/18/2017  . TRANSURETHRAL RESECTION OF BLADDER TUMOR N/A 03/25/2016   Procedure: TRANSURETHRAL RESECTION OF BLADDER TUMOR (TURBT);  Surgeon: Franchot Gallo, MD;  Location: [facility];  Service: Urology;  Laterality: N/A;  . TRANSURETHRAL RESECTION OF BLADDER TUMOR N/A 06/16/2016   Procedure: TRANSURETHRAL RESECTION OF BLADDER TUMOR (  TURBT);  Surgeon: Franchot Gallo, MD;  Location: [facility];  Service: Urology;  Laterality: N/A;  . TRANSURETHRAL RESECTION OF BLADDER TUMOR N/A 12/16/2016   Procedure: TRANSURETHRAL RESECTION OF BLADDER TUMOR (TURBT);  Surgeon: Franchot Gallo, MD;  Location: [facility];  Service: Urology;  Laterality: N/A;  . TRANSURETHRAL RESECTION OF BLADDER TUMOR N/A 05/11/2017   Procedure: TRANSURETHRAL RESECTION OF BLADDER TUMOR (TURBT) CLOT EVACUATION;  Surgeon: Franchot Gallo, MD;  Location: [facility];  Service: Urology;  Laterality: N/A;  1 HR [phone] BLUE  MEDICARE-[id number]     SOCIAL HISTORY:  Social History   Socioeconomic History  . Marital status: Married    Spouse name: Not on file  . Number of children: Not on file  . Years of education: Not on file  . Highest education level: Not on file  Occupational History  . Not on file  Social Needs  . Financial resource strain: Not on file  . Food insecurity:    Worry: Not on file    Inability: Not on file  . Transportation needs:    Medical: Not on file    Non-medical: Not on file  Tobacco Use  . Smoking status: Current Every Day Smoker    Packs/day: 2.00    Years: 50.00    Pack years: 100.00    Types: Cigarettes  . Smokeless tobacco: Never Used  Substance and Sexual Activity  . Alcohol use: No    Comment: occasionally  . Drug use: No  . Sexual activity: Never    Birth control/protection: None  Lifestyle  . Physical activity:    Days per week: Not on file    Minutes per session: Not on file  . Stress: Not on file  Relationships  . Social connections:    Talks on phone: Not on file    Gets together: Not on file    Attends religious service: Not on file    Active member of club or organization: Not on file    Attends meetings of clubs or organizations: Not on file    Relationship status: Not on file  . Intimate partner violence:    Fear of current or ex partner: Not on file    Emotionally abused: Not on file    Physically abused: Not on file    Forced sexual activity: Not on file  Other Topics Concern  . Not on file  Social History Narrative  . Not on file    FAMILY HISTORY:  Family History  Problem Relation Age of Onset  . Hypertension Mother   . Asthma Father   . Emphysema Father     CURRENT MEDICATIONS:  Outpatient Encounter Medications as of 02/22/2018  Medication Sig  . acetaminophen (TYLENOL) 500 MG tablet Take 500 mg by mouth as needed (PRN for pain).  . carvedilol (COREG) 25 MG tablet Take 25 mg by mouth 2 (two) times daily.   .  cetirizine (ZYRTEC) 10 MG tablet Take 10 mg by mouth daily.  . citalopram (CELEXA) 20 MG tablet Take 20 mg by mouth at bedtime.   . Diphenhyd-Hydrocort-Nystatin (FIRST-DUKES MOUTHWASH) SUSP Use as directed 5 mLs in the mouth or throat 4 (four) times daily -  before meals and at bedtime.  . fluticasone (FLONASE) 50 MCG/ACT nasal spray Place 2 sprays into both nostrils daily as needed for allergies or rhinitis.  . Gemcitabine HCl (GEMZAR IV) Inject into the vein. Day 1, day 8 every 21 days  . lidocaine (XYLOCAINE) 2 % solution Swish and  swallow 17ml three to four times a day as needed for throat pain.  Marland Kitchen oxybutynin (DITROPAN) 5 MG tablet Take 1 tablet (5 mg total) by mouth every 6 (six) hours as needed for bladder spasms.  . prochlorperazine (COMPAZINE) 10 MG tablet Take 1 tablet (10 mg total) by mouth every 6 (six) hours as needed for nausea or vomiting.   No facility-administered encounter medications on file as of 02/22/2018.     ALLERGIES:  Allergies  Allergen Reactions  . Carboplatin Shortness Of Breath    Reaction occurred after patient left Cancer Center. Pt reported extreme sob after leaving [facility] that eventually resolved on its own.     PHYSICAL EXAM:  ECOG Performance status: 1  Vitals:   02/22/18 1047  BP: (!) 149/55  Pulse: 73  Resp: 20  Temp: 98.5 F (36.9 C)  SpO2: 96%   Filed Weights   02/22/18 1047  Weight: 171 lb 9.6 oz (77.8 kg)    Physical Exam  Constitutional: He is well-developed, well-nourished, and in no distress.  Cardiovascular: Normal rate and regular rhythm.  Pulmonary/Chest: Effort normal and breath sounds normal.  Psychiatric: Mood, memory, affect and judgment normal.     LABORATORY DATA:  I have reviewed the labs as listed.  CBC    Component Value Date/Time   WBC 9.2 02/22/2018 1053   RBC 2.28 (L) 02/22/2018 1053   HGB 7.5 (L) 02/22/2018 1053   HCT 23.3 (L) 02/22/2018 1053   PLT 231 02/22/2018 1053   MCV 102.2 (H) 02/22/2018 1053   MCH  32.9 02/22/2018 1053   MCHC 32.2 02/22/2018 1053   RDW 15.1 02/22/2018 1053   LYMPHSABS 0.4 (L) 02/22/2018 1053   MONOABS 1.4 (H) 02/22/2018 1053   EOSABS 0.1 02/22/2018 1053   BASOSABS 0.0 02/22/2018 1053   CMP Latest Ref Rng & Units 02/22/2018 01/25/2018 12/31/2017  Glucose 65 - 99 mg/dL 107(H) 141(H) 99  BUN 6 - 20 mg/dL 36(H) 34(H) 28(H)  Creatinine 0.61 - 1.24 mg/dL 3.30(H) 2.61(H) 2.32(H)  Sodium 135 - 145 mmol/L 137 138 141  Potassium 3.5 - 5.1 mmol/L 3.3(L) 3.8 3.9  Chloride 101 - 111 mmol/L 104 106 108  CO2 22 - 32 mmol/L 22 23 24   Calcium 8.9 - 10.3 mg/dL 7.9(L) 8.4(L) 8.0(L)  Total Protein 6.5 - 8.1 g/dL 6.0(L) 6.2(L) 6.0(L)  Total Bilirubin 0.3 - 1.2 mg/dL 0.5 0.3 0.4  Alkaline Phos 38 - 126 U/L 60 66 53  AST 15 - 41 U/L 15 19 17   ALT 17 - 63 U/L 7(L) 8(L) 11(L)       DIAGNOSTIC IMAGING:  PET scan on 11/18/2018 reviewed by me shows partial metabolic response.     ASSESSMENT & PLAN:   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  Stage IV bladder cancer with lung metastasis (DX 06/29/2017): -Status post 6 cycles of carboplatin and gemcitabine completed on 11/02/2017 -PET/CT scan on 11/18/2017 showing significant partial metabolic response -Maintenance gemcitabine day 1 and day 15 every 28 days started on 12/03/2017 -His last gemcitabine was 4 weeks ago.  He is having nausea vomiting and decreased appetite since he was started on single agent gemcitabine.  He also reported hematuria for the last 4-6 weeks.  I will hold his next cycle of chemotherapy today.  I plan to repeat CT scan of the chest, abdomen and pelvis and see him back next week.  2.  CKD: His creatinine went up from 2.6 at last visit to 3.3 today.  He has right hydronephrosis due to recurrent bladder cancer with disease in the right distal ureter.  He was referred to interventional radiology for stent placement by his urologist last year, but patient chose to defer the procedure.  Will follow up on  scans.  Next line  3.  Anemia: His hemoglobin is 7.5 today.  We will transfuse him if hemoglobin less than 7.  Combination etiology from chemotherapy and kidney disease.      Orders placed this encounter:  No orders of the defined types were placed in this encounter.     Derek Jack, MD [place] [phone]

## 2018-02-22 NOTE — Assessment & Plan Note (Signed)
1.  Stage IV bladder cancer with lung metastasis (DX 06/29/2017): -Status post 6 cycles of carboplatin and gemcitabine completed on 11/02/2017 -PET/CT scan on 11/18/2017 showing significant partial metabolic response -Maintenance gemcitabine day 1 and day 15 every 28 days started on 12/03/2017 -His last gemcitabine was 4 weeks ago.  He is having nausea vomiting and decreased appetite since he was started on single agent gemcitabine.  He also reported hematuria for the last 4-6 weeks.  I will hold his next cycle of chemotherapy today.  I plan to repeat CT scan of the chest, abdomen and pelvis and see him back next week.  2.  CKD: His creatinine went up from 2.6 at last visit to 3.3 today.  He has right hydronephrosis due to recurrent bladder cancer with disease in the right distal ureter.  He was referred to interventional radiology for stent placement by his urologist last year, but patient chose to defer the procedure.  Will follow up on scans.  Next line  3.  Anemia: His hemoglobin is 7.5 today.  We will transfuse him if hemoglobin less than 7.  Combination etiology from chemotherapy and kidney disease.

## 2018-02-22 NOTE — Patient Instructions (Addendum)
[facility] at [facility] Discharge Instructions  Today you saw Dr. K.   Thank you for choosing [facility] at [facility] to provide your oncology and hematology care.  To afford each patient quality time with our provider, please arrive at least 15 minutes before your scheduled appointment time.   If you have a lab appointment with the Cancer Center please come in thru the  Main Entrance and check in at the main information desk  You need to re-schedule your appointment should you arrive 10 or more minutes late.  We strive to give you quality time with our providers, and arriving late affects you and other patients whose appointments are after yours.  Also, if you no show three or more times for appointments you may be dismissed from the clinic at the providers discretion.     Again, thank you for choosing [facility].  Our hope is that these requests will decrease the amount of time that you wait before being seen by our physicians.       _____________________________________________________________  Should you have questions after your visit to [facility], please contact our office at [phone] between the hours of 8:30 a.m. and 4:30 p.m.  Voicemails left after 4:30 p.m. will not be returned until the following business day.  For prescription refill requests, have your pharmacy contact our office.       Resources For Cancer Patients and their Caregivers ? American Cancer Society: Can assist with transportation, wigs, general needs, runs Look Good Feel Better.        1-888-227-6333 ? Cancer Care: Provides financial assistance, online support groups, medication/co-pay assistance.  1-800-813-HOPE (4673) ? [facility] Assists [place] cancer patients and their families through emotional , educational and financial support.  336-427-4357 ? [place] DSS Where to apply for food  stamps, Medicaid and utility assistance. 336-342-1394 ? [facility]: Transportation to medical appointments. 336-347-2287 ? Social Security Administration: May apply for disability if have a Stage IV cancer. 336-342-7796 1-800-772-1213 ? [place] Aging, Disability and Transit Services: Assists with nutrition, care and transit needs. 336-349-2343  Cancer Center Support Programs:   > Cancer Support Group  2nd Tuesday of the month 1pm-2pm, Journey Room   > Creative Journey  3rd Tuesday of the month 1130am-1pm, Journey Room   [facility] Discharge Instructions Today you saw Dr. K.    Thank you for choosing [facility] at [facility] to provide your oncology and hematology care.  To afford each patient quality time with our provider, please arrive at least 15 minutes before your scheduled appointment time.   If you have a lab appointment with the Cancer Center please come in thru the  Main Entrance and check in at the main information desk  You need to re-schedule your appointment should you arrive 10 or more minutes late.  We strive to give you quality time with our providers, and arriving late affects you and other patients whose appointments are after yours.  Also, if you no show three or more times for appointments you may be dismissed from the clinic at the providers discretion.     Again, thank you for choosing [facility].  Our hope is that these requests will decrease the amount of time that you wait before being seen by our physicians.       _____________________________________________________________    Should you have questions after your visit to [facility], please contact our office at [phone] between the hours of 8:30 a.m. and 4:30 p.m.  Voicemails left after 4:30 p.m. will not be returned until the following business day.  For prescription refill requests, have your pharmacy contact our  office.       Resources For Cancer Patients and their Caregivers ? American Cancer Society: Can assist with transportation, wigs, general needs, runs Look Good Feel Better.        1-888-227-6333 ? Cancer Care: Provides financial assistance, online support groups, medication/co-pay assistance.  1-800-813-HOPE (4673) ? [facility] Assists [place] cancer patients and their families through emotional , educational and financial support.  336-427-4357 ? [place] DSS Where to apply for food stamps, Medicaid and utility assistance. 336-342-1394 ? [facility]: Transportation to medical appointments. 336-347-2287 ? Social Security Administration: May apply for disability if have a Stage IV cancer. 336-342-7796 1-800-772-1213 ? [place] Aging, Disability and Transit Services: Assists with nutrition, care and transit needs. 336-349-2343  Cancer Center Support Programs:   > Cancer Support Group  2nd Tuesday of the month 1pm-2pm, Journey Room   > Creative Journey  3rd Tuesday of the month 1130am-1pm, Journey Room    

## 2018-02-24 ENCOUNTER — Other Ambulatory Visit (HOSPITAL_COMMUNITY): Payer: Self-pay | Admitting: *Deleted

## 2018-02-24 MED ORDER — ONDANSETRON 4 MG PO TBDP: 4.0000 mg | ORAL_TABLET | Freq: Three times a day (TID) | ORAL | 2 refills | Status: DC | PRN

## 2018-03-01 ENCOUNTER — Encounter (HOSPITAL_COMMUNITY)
Admission: RE | Admit: 2018-03-01 | Discharge: 2018-03-01 | Disposition: A | Discharge status: Home / Self Care | Payer: Medicare Other | Source: Ambulatory Visit | Attending: Internal Medicine | Admitting: Internal Medicine

## 2018-03-01 DIAGNOSIS — C670 Malignant neoplasm of trigone of bladder: Secondary | ICD-10-CM

## 2018-03-01 MED ORDER — FLUDEOXYGLUCOSE F - 18 (FDG) INJECTION
10.0000 | Freq: Once | INTRAVENOUS | Status: AC
  Administered 2018-03-01: 11.4 via INTRAVENOUS

## 2018-03-01 NOTE — OR Nursing (Signed)
Patient here for a PET scan. Staff unable to get a IV for patient to have test. Started 24 gauge PIV in right hand X 1 attempt. Patient tolerated without difficulty.

## 2018-03-02 ENCOUNTER — Ambulatory Visit: Payer: Medicare Other | Admitting: Urology

## 2018-03-02 DIAGNOSIS — C678 Malignant neoplasm of overlapping sites of bladder: Secondary | ICD-10-CM

## 2018-03-08 ENCOUNTER — Encounter (HOSPITAL_COMMUNITY): Payer: Self-pay | Admitting: Hematology

## 2018-03-08 ENCOUNTER — Other Ambulatory Visit: Payer: Self-pay

## 2018-03-08 ENCOUNTER — Inpatient Hospital Stay (HOSPITAL_COMMUNITY): Payer: Medicare Other | Attending: Oncology | Admitting: Hematology

## 2018-03-08 ENCOUNTER — Other Ambulatory Visit (HOSPITAL_COMMUNITY): Payer: Medicare Other

## 2018-03-08 ENCOUNTER — Inpatient Hospital Stay (HOSPITAL_COMMUNITY): Payer: Medicare Other

## 2018-03-08 ENCOUNTER — Encounter (HOSPITAL_COMMUNITY): Payer: Self-pay

## 2018-03-08 VITALS — BP 132/69 | HR 69 | Temp 98.4°F | Wt 175.6 lb

## 2018-03-08 DIAGNOSIS — Z5112 Encounter for antineoplastic immunotherapy: Secondary | ICD-10-CM | POA: Diagnosis not present

## 2018-03-08 DIAGNOSIS — D509 Iron deficiency anemia, unspecified: Secondary | ICD-10-CM | POA: Diagnosis not present

## 2018-03-08 DIAGNOSIS — D500 Iron deficiency anemia secondary to blood loss (chronic): Secondary | ICD-10-CM | POA: Diagnosis not present

## 2018-03-08 DIAGNOSIS — R319 Hematuria, unspecified: Secondary | ICD-10-CM | POA: Insufficient documentation

## 2018-03-08 DIAGNOSIS — C679 Malignant neoplasm of bladder, unspecified: Secondary | ICD-10-CM

## 2018-03-08 DIAGNOSIS — C7800 Secondary malignant neoplasm of unspecified lung: Secondary | ICD-10-CM

## 2018-03-08 DIAGNOSIS — R05 Cough: Secondary | ICD-10-CM | POA: Diagnosis not present

## 2018-03-08 DIAGNOSIS — I749 Embolism and thrombosis of unspecified artery: Secondary | ICD-10-CM | POA: Diagnosis not present

## 2018-03-08 DIAGNOSIS — D6481 Anemia due to antineoplastic chemotherapy: Secondary | ICD-10-CM | POA: Insufficient documentation

## 2018-03-08 DIAGNOSIS — N189 Chronic kidney disease, unspecified: Secondary | ICD-10-CM | POA: Insufficient documentation

## 2018-03-08 DIAGNOSIS — D631 Anemia in chronic kidney disease: Secondary | ICD-10-CM | POA: Diagnosis not present

## 2018-03-08 DIAGNOSIS — C670 Malignant neoplasm of trigone of bladder: Secondary | ICD-10-CM | POA: Insufficient documentation

## 2018-03-08 DIAGNOSIS — R531 Weakness: Secondary | ICD-10-CM | POA: Diagnosis not present

## 2018-03-08 DIAGNOSIS — Z720 Tobacco use: Secondary | ICD-10-CM | POA: Insufficient documentation

## 2018-03-08 LAB — COMPREHENSIVE METABOLIC PANEL
ALK PHOS: 56 U/L (ref 38–126)
ALT: 7 U/L — ABNORMAL LOW (ref 17–63)
ANION GAP: 9 (ref 5–15)
AST: 11 U/L — ABNORMAL LOW (ref 15–41)
Albumin: 2.3 g/dL — ABNORMAL LOW (ref 3.5–5.0)
BILIRUBIN TOTAL: 0.4 mg/dL (ref 0.3–1.2)
BUN: 41 mg/dL — ABNORMAL HIGH (ref 6–20)
CALCIUM: 7.5 mg/dL — AB (ref 8.9–10.3)
CO2: 24 mmol/L (ref 22–32)
Chloride: 107 mmol/L (ref 101–111)
Creatinine, Ser: 3.61 mg/dL — ABNORMAL HIGH (ref 0.61–1.24)
GFR calc non Af Amer: 16 mL/min — ABNORMAL LOW (ref >60)
GFR, EST AFRICAN AMERICAN: 18 mL/min — AB (ref >60)
GLUCOSE: 90 mg/dL (ref 65–99)
Potassium: 3.6 mmol/L (ref 3.5–5.1)
Sodium: 140 mmol/L (ref 135–145)
TOTAL PROTEIN: 6 g/dL — AB (ref 6.5–8.1)

## 2018-03-08 LAB — CBC WITH DIFFERENTIAL/PLATELET
BASOS ABS: 0 10*3/uL (ref 0.0–0.1)
Basophils Relative: 0 %
Eosinophils Absolute: 0.2 10*3/uL (ref 0.0–0.7)
Eosinophils Relative: 3 %
HCT: 19.3 % — ABNORMAL LOW (ref 39.0–52.0)
HEMOGLOBIN: 5.9 g/dL — AB (ref 13.0–17.0)
LYMPHS PCT: 7 %
Lymphs Abs: 0.5 10*3/uL — ABNORMAL LOW (ref 0.7–4.0)
MCH: 31.7 pg (ref 26.0–34.0)
MCHC: 30.6 g/dL (ref 30.0–36.0)
MCV: 103.8 fL — ABNORMAL HIGH (ref 78.0–100.0)
Monocytes Absolute: 0.5 10*3/uL (ref 0.1–1.0)
Monocytes Relative: 7 %
NEUTROS ABS: 5.6 10*3/uL (ref 1.7–7.7)
NEUTROS PCT: 83 %
PLATELETS: 330 10*3/uL (ref 150–400)
RBC: 1.86 MIL/uL — ABNORMAL LOW (ref 4.22–5.81)
RDW: 14.7 % (ref 11.5–15.5)
WBC: 6.8 10*3/uL (ref 4.0–10.5)

## 2018-03-08 LAB — PREPARE RBC (CROSSMATCH)

## 2018-03-08 MED ORDER — SODIUM CHLORIDE 0.9% FLUSH
20.0000 mL | INTRAVENOUS | Status: DC | PRN
  Administered 2018-03-08: 20 mL via INTRAVENOUS
  Filled 2018-03-08: qty 20

## 2018-03-08 MED ORDER — HEPARIN SOD (PORK) LOCK FLUSH 100 UNIT/ML IV SOLN
500.0000 [IU] | Freq: Once | INTRAVENOUS | Status: AC
  Administered 2018-03-08: 500 [IU] via INTRAVENOUS

## 2018-03-08 NOTE — Progress Notes (Signed)
[place], [place] [address]   CLINIC:  Medical Oncology/Hematology  PCP:  [name], [place]  [address] [phone]   REASON FOR VISIT:  Follow-up for metastatic bladder cancer.  CURRENT THERAPY: Change in chemotherapy to [facility] HISTORY:  Oncology History   Per available records had bladder tumor removed but had extension into the muscularis propria. CT showed no extension beyond the bladder. Has seen 2 urologists in consultation. Adamant about not having a cystectomy and ileal conduit.     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 No findings for local spread of disease or metastatic disease      03/25/2016 Surgery    TURBT 3cm right trigonal bladder tumor. followed by intravesical epirubicin instillation      03/25/2016 Pathology Results    bladder TURBT infiltrative high grade papillary urothelial carcinoma, invades the mulcularis propria (detrusor) no LVI       04/14/2016 Miscellaneous    [name] consultation in [place] for consideration of cystectomy. patient also offered repeat resection with BCG, and CHEMO/XRT      06/17/2016 Pathology Results    TURB- high grade papillary urothelial carcinoma with focal stromal invasion       Radiation Therapy    Planned to be completed on 09/10/2016.      07/22/2016 -  Chemotherapy    The patient had palonosetron (ALOXI) injection 0.25 mg, 0.25 mg, Intravenous,  Once, 2 of 5 cycles Administration: 0.25 mg (07/28/2016)  CARBOplatin (PARAPLATIN) 160 mg in sodium chloride 0.9 % 100 mL chemo infusion, 160 mg (100 % of original dose 160.6 mg), Intravenous,  Once, 2 of 5 cycles Dose modification:   (original dose 160.6 mg, Cycle 1),   (original dose 160.6 mg, Cycle 1),   (original dose 160.6 mg, Cycle 1),   (original dose 160.6 mg, Cycle  2) Administration: 180 mg (07/28/2016)  for chemotherapy treatment.        04/07/2017 Relapse/Recurrence    CT Abd/pelvis: IMPRESSION: 1. Recurrent bladder carcinoma with synchronous disease in the distal right ureter causing severe right hydronephrosis and decreased right renal function. 2. Bilateral renal stones. 3. Aortic atherosclerosis (ICD10-170.0). Coronary artery calcification. 4. Small periumbilical hernia contains a knuckle of unobstructed small bowel.       06/10/2017 PET scan    1. Metachronous urothelial carcinoma within the distal right ureter, as on prior CT. 2. No evidence of nodal metastasis. 3. Bilateral hypermetabolic pulmonary nodules, favoring pulmonary metastasis. Especially given emphysema, 1 or more primary bronchogenic carcinomas cannot be excluded but are felt less likely. 4. Coronary artery atherosclerosis. Aortic Atherosclerosis (ICD10-I70.0). 5. Bladder not well evaluated secondary to hypermetabolic urine within.       06/18/2017 Procedure    Placement of single lumen port a cath via right internal jugular vein. The catheter tip lies at the cavo-atrial junction. A power injectable port a cath was placed and is ready for immediate use.      06/29/2017 -  Chemotherapy    Carboplatin/Gemcitabine every 21 days       08/14/2017 Imaging    CT C/A/P IMPRESSION: 1. Overall stable pulmonary nodules. Knee soft tissue rim around the cavitary lesion in the left upper lobe is slightly thinner. No new pulmonary nodules or progressive findings in the chest. 2. Stable advanced emphysematous changes and  pulmonary scarring. 3. No mediastinal or hilar mass or lymphadenopathy. 4. Improved CT appearance of the bladder. The asymmetric areas of bladder wall enhancement have improved. There is still diffuse irregular bladder wall thickening and distal right ureteral tumor appears stable. Stable right-sided hydroureteronephrosis. 5. Stable cirrhotic changes  involving the liver and stable liver cysts. 6. Stable scattered mesenteric and retroperitoneal and pelvic lymph nodes but no mass or overt adenopathy. 7. Stable severe/advanced atherosclerotic calcifications involving the thoracic and abdominal aorta and branch vessels.      11/18/2017 Imaging    PET CT: IMPRESSION: 1. Significant partial metabolic response. Bilateral mildly hypermetabolic pulmonary metastases are decreased in size and metabolism. No new or progressive hypermetabolic metastatic disease. 2. Stable hypermetabolic distal right ureteral tumor with marked right hydroureteronephrosis. 3. Chronic findings include: Aortic Atherosclerosis (ICD10-I70.0) and Emphysema (ICD10-J43.9). Three-vessel coronary atherosclerosis. Stable small fat containing umbilical hernia.      03/02/2018 Imaging    Whole body PET CT scan  IMPRESSION: 1. Progressive pulmonary metastatic disease with enlarging and progressively hypermetabolic pulmonary nodules as described above. 2. New nodularity at the left lung base is hypermetabolic but could be an inflammatory or infectious process. 3. Stable hypermetabolic tumor in the right distal ureter. 4. No evidence of metastatic disease involving the abdomen/pelvis or osseous structures. 5. Chronic bilateral hydroureteronephrosis and asymmetric bladder wall thickening. 6. Stable severe atherosclerotic disease      03/08/2018 -  Chemotherapy    The patient had pembrolizumab (KEYTRUDA) 200 mg in sodium chloride 0.9 % 50 mL chemo infusion, 200 mg, Intravenous, Once, 0 of 4 cycles  for chemotherapy treatment.         CANCER STAGING: Cancer Staging Malignant neoplasm of trigone of bladder ([place]) Staging form: Urinary Bladder, AJCC 7th Edition - Clinical stage from 05/15/2016: Stage II (T2, N0, M0) - Signed by Baird Cancer, PA-C on 08/11/2016 - Pathologic stage from 06/29/2017: Stage IV (T2a, N0, M1) - Signed by Baird Cancer, PA-C on  06/29/2017    INTERVAL HISTORY:  Carl Murphy 72 y.o. male returns for follow-up of PET/CT scan results.  He is continuing to have hematuria.  He was evaluated by Dr. Diona Fanti last Tuesday and a cystoscopy was done in the office.  I was not able to pull up the note.  Apparently there was growth of the tumor.  He had a PET CT scan done last Monday.  He is still continuing to feel weak.  He has hematuria.  He also has clots.  His cough has been stable.  He denies any autoimmune problems.  Denies any fevers, night sweats or weight loss.  REVIEW OF SYSTEMS:  Review of Systems  Constitutional: Positive for fatigue.  Respiratory: Positive for cough.   Cardiovascular: Positive for leg swelling.  Genitourinary: Positive for hematuria.   All other systems reviewed and are negative.    PAST MEDICAL/SURGICAL HISTORY:  Past Medical History:  Diagnosis Date  . Arthritis   . Cancer [facility])    Bladder Cancer  . CHF (congestive heart failure) ([place])   . COPD (chronic obstructive pulmonary disease) ([place])   . Depression   . Hard of hearing    bilat   . Hypertension   . Malignant neoplasm of trigone of bladder ([place]) 03/25/2016  . PONV (postoperative nausea and vomiting)   . Shortness of breath dyspnea    heat; walking up set of stairs; pt states can walk up 12-14 stairs slowly without having to stop to catch his breath  Past Surgical History:  Procedure Laterality Date  . CYSTOSCOPY N/A 06/16/2016   Procedure: CYSTOSCOPY;  Surgeon: Franchot Gallo, MD;  Location: [facility];  Service: Urology;  Laterality: N/A;  . IR FLUORO GUIDE PORT INSERTION RIGHT  06/18/2017  . IR US GUIDE VASC ACCESS RIGHT  06/18/2017  . TRANSURETHRAL RESECTION OF BLADDER TUMOR N/A 03/25/2016   Procedure: TRANSURETHRAL RESECTION OF BLADDER TUMOR (TURBT);  Surgeon: Franchot Gallo, MD;  Location: [facility];  Service: Urology;  Laterality: N/A;  . TRANSURETHRAL RESECTION OF BLADDER TUMOR N/A 06/16/2016   Procedure: TRANSURETHRAL  RESECTION OF BLADDER TUMOR (TURBT);  Surgeon: Franchot Gallo, MD;  Location: [facility];  Service: Urology;  Laterality: N/A;  . TRANSURETHRAL RESECTION OF BLADDER TUMOR N/A 12/16/2016   Procedure: TRANSURETHRAL RESECTION OF BLADDER TUMOR (TURBT);  Surgeon: Franchot Gallo, MD;  Location: [facility];  Service: Urology;  Laterality: N/A;  . TRANSURETHRAL RESECTION OF BLADDER TUMOR N/A 05/11/2017   Procedure: TRANSURETHRAL RESECTION OF BLADDER TUMOR (TURBT) CLOT EVACUATION;  Surgeon: Franchot Gallo, MD;  Location: [facility];  Service: Urology;  Laterality: N/A;  1 HR [phone] BLUE [id number]     SOCIAL HISTORY:  Social History   Socioeconomic History  . Marital status: Married    Spouse name: Not on file  . Number of children: Not on file  . Years of education: Not on file  . Highest education level: Not on file  Occupational History  . Not on file  Social Needs  . Financial resource strain: Not on file  . Food insecurity:    Worry: Not on file    Inability: Not on file  . Transportation needs:    Medical: Not on file    Non-medical: Not on file  Tobacco Use  . Smoking status: Current Every Day Smoker    Packs/day: 2.00    Years: 50.00    Pack years: 100.00    Types: Cigarettes  . Smokeless tobacco: Never Used  Substance and Sexual Activity  . Alcohol use: No    Comment: occasionally  . Drug use: No  . Sexual activity: Never    Birth control/protection: None  Lifestyle  . Physical activity:    Days per week: Not on file    Minutes per session: Not on file  . Stress: Not on file  Relationships  . Social connections:    Talks on phone: Not on file    Gets together: Not on file    Attends religious service: Not on file    Active member of club or organization: Not on file    Attends meetings of clubs or organizations: Not on file    Relationship status: Not on file  . Intimate partner violence:    Fear of current or ex partner: Not on file     Emotionally abused: Not on file    Physically abused: Not on file    Forced sexual activity: Not on file  Other Topics Concern  . Not on file  Social History Narrative  . Not on file    FAMILY HISTORY:  Family History  Problem Relation Age of Onset  . Hypertension Mother   . Asthma Father   . Emphysema Father     CURRENT MEDICATIONS:  Outpatient Encounter Medications as of 03/08/2018  Medication Sig  . acetaminophen (TYLENOL) 500 MG tablet Take 500 mg by mouth as needed (PRN for pain).  . carvedilol (COREG) 25 MG tablet Take 25 mg by mouth 2 (two) times daily.   Marland Kitchen  cetirizine (ZYRTEC) 10 MG tablet Take 10 mg by mouth daily.  . citalopram (CELEXA) 20 MG tablet Take 20 mg by mouth at bedtime.   . fluticasone (FLONASE) 50 MCG/ACT nasal spray Place 2 sprays into both nostrils daily as needed for allergies or rhinitis.  Marland Kitchen ondansetron (ZOFRAN-ODT) 4 MG disintegrating tablet Take 1 tablet (4 mg total) by mouth every 8 (eight) hours as needed for nausea or vomiting.  Marland Kitchen oxybutynin (DITROPAN) 5 MG tablet Take 1 tablet (5 mg total) by mouth every 6 (six) hours as needed for bladder spasms.  . prochlorperazine (COMPAZINE) 10 MG tablet Take 1 tablet (10 mg total) by mouth every 6 (six) hours as needed for nausea or vomiting.  . Diphenhyd-Hydrocort-Nystatin (FIRST-DUKES MOUTHWASH) SUSP Use as directed 5 mLs in the mouth or throat 4 (four) times daily -  before meals and at bedtime. (Patient not taking: Reported on 03/08/2018)  . Gemcitabine HCl (GEMZAR IV) Inject into the vein. Day 1, day 8 every 21 days  . lidocaine (XYLOCAINE) 2 % solution Swish and swallow 54ml three to four times a day as needed for throat pain. (Patient not taking: Reported on 03/08/2018)   No facility-administered encounter medications on file as of 03/08/2018.     ALLERGIES:  Allergies  Allergen Reactions  . Carboplatin Shortness Of Breath    Reaction occurred after patient left Cancer Center. Pt reported extreme sob after  leaving [facility] that eventually resolved on its own.     PHYSICAL EXAM:  ECOG Performance status: 2  Vitals:   03/08/18 1045  BP: 132/69  Pulse: 69  Temp: 98.4 F (36.9 C)  SpO2: 95%   Filed Weights   03/08/18 1045  Weight: 175 lb 9.6 oz (79.7 kg)    LABORATORY DATA:  I have reviewed the labs as listed.  CBC    Component Value Date/Time   WBC 6.8 03/08/2018 1058   RBC 1.86 (L) 03/08/2018 1058   HGB 5.9 (LL) 03/08/2018 1058   HCT 19.3 (L) 03/08/2018 1058   PLT 330 03/08/2018 1058   MCV 103.8 (H) 03/08/2018 1058   MCH 31.7 03/08/2018 1058   MCHC 30.6 03/08/2018 1058   RDW 14.7 03/08/2018 1058   LYMPHSABS 0.5 (L) 03/08/2018 1058   MONOABS 0.5 03/08/2018 1058   EOSABS 0.2 03/08/2018 1058   BASOSABS 0.0 03/08/2018 1058   CMP Latest Ref Rng & Units 03/08/2018 02/22/2018 01/25/2018  Glucose 65 - 99 mg/dL 90 107(H) 141(H)  BUN 6 - 20 mg/dL 41(H) 36(H) 34(H)  Creatinine 0.61 - 1.24 mg/dL 3.61(H) 3.30(H) 2.61(H)  Sodium 135 - 145 mmol/L 140 137 138  Potassium 3.5 - 5.1 mmol/L 3.6 3.3(L) 3.8  Chloride 101 - 111 mmol/L 107 104 106  CO2 22 - 32 mmol/L 24 22 23   Calcium 8.9 - 10.3 mg/dL 7.5(L) 7.9(L) 8.4(L)  Total Protein 6.5 - 8.1 g/dL 6.0(L) 6.0(L) 6.2(L)  Total Bilirubin 0.3 - 1.2 mg/dL 0.4 0.5 0.3  Alkaline Phos 38 - 126 U/L 56 60 66  AST 15 - 41 U/L 11(L) 15 19  ALT 17 - 63 U/L 7(L) 7(L) 8(L)       DIAGNOSTIC IMAGING:  I have personally reviewed the PET CT scan dated 03/02/2018 which showed progression in the lung metastasis.     ASSESSMENT & PLAN:   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  Stage IV bladder cancer with lung metastasis (DX 06/29/2017): -Status post 6 cycles of carboplatin and gemcitabine completed on 11/02/2017 -PET/CT scan  on 11/18/2017 showing significant partial metabolic response -Maintenance gemcitabine day 1 and day 15 every 28 days started on 12/03/2017 -I discussed the results of the PET CT scan with the patient and his daughter in  detail.  There is worsening of pulmonary metastatic disease.  Bilateral hydronephrosis and right ureteral mass are stable.  I have recommended a change in treatment at this time.  We talked about starting him on pembrolizumab 200 mg every 3 weeks until progression.  We talked about immune related side effects in detail.  We plan to start him tomorrow.  2.  CKD: His creatinine has gone up to 3.6 today.  This has been consistently going up since January of this year.  He is also having hematuria with clots.  He reportedly underwent cystoscopy last Tuesday by Dr. Diona Fanti.  I would contact him about future plan.  He might need percutaneous nephrostomy tubes.  He was referred to interventional radiology for stent placement by his urologist last year, but patient chose to defer the procedure.    3.  Anemia: This is from a combination of blood loss, chronic kidney disease and chemotherapy.  Today his hemoglobin is down to 5.2.  We will arrange for blood transfusion.      Orders placed this encounter:  Orders Placed This Encounter  Procedures  . Practitioner attestation of consent  . Complete patient signature process for consent form  . Care order/instruction  . Type and screen  . Prepare RBC      Derek Jack, MD [place] [phone]

## 2018-03-08 NOTE — Progress Notes (Signed)
Carl Murphy tolerated portacath lab draw with flush well without complaints or incident. Chemo tx held today per MD order and pt to received 2 units of blood tomorrow for Hgb of 5.9. This was discussed with pt and daughter who verbalized understanding

## 2018-03-08 NOTE — Progress Notes (Signed)
CRITICAL VALUE ALERT Critical value received:  HGB 5.9 Date of notification:  03/08/18 Time of notification: 1200 Critical value read back:  Yes.   Nurse who received alert:  Isidoro Donning RN Dr. Raliegh Ip notified. 2 units of PRBCs ordered.

## 2018-03-08 NOTE — Patient Instructions (Signed)
[place] at [facility] Discharge Instructions  Portacath lab draw with flush today. Chemo tx held. Follow-up as scheduled. Call clinic for any questions or concerns   Thank you for choosing [place] at [facility] to provide your oncology and hematology care.  To afford each patient quality time with our provider, please arrive at least 15 minutes before your scheduled appointment time.   If you have a lab appointment with the [facility] please come in thru the  Main Entrance and check in at the main information desk  You need to re-schedule your appointment should you arrive 10 or more minutes late.  We strive to give you quality time with our providers, and arriving late affects you and other patients whose appointments are after yours.  Also, if you no show three or more times for appointments you may be dismissed from the clinic at the providers discretion.     Again, thank you for choosing [facility].  Our hope is that these requests will decrease the amount of time that you wait before being seen by our physicians.       _____________________________________________________________  Should you have questions after your visit to [facility], please contact our office at [phone] between the hours of 8:30 a.m. and 4:30 p.m.  Voicemails left after 4:30 p.m. will not be returned until the following business day.  For prescription refill requests, have your pharmacy contact our office.       Resources For Cancer Patients and their Caregivers ? American Cancer Society: Can assist with transportation, wigs, general needs, runs Look Good Feel Better.        236-398-9342 ? Cancer Care: Provides financial assistance, online support groups, medication/co-pay assistance.  1-800-813-HOPE 267-865-4610) ? [employer] Assists [place] cancer patients and their families through emotional ,  educational and financial support.  2054464569 ? [place] DSS Where to apply for food stamps, Medicaid and utility assistance. 4638390247 ? [employer]: Transportation to medical appointments. 484-754-5300 ? Social Security Administration: May apply for disability if have a Stage IV cancer. 815-165-8378 269-486-1871 ? [employer], Disability and Transit Services: Assists with nutrition, care and transit needs. [place] Support Programs:   > Cancer Support Group  2nd Tuesday of the month 1pm-2pm, Journey Room   > Creative Journey  3rd Tuesday of the month 1130am-1pm, Journey Room

## 2018-03-08 NOTE — Assessment & Plan Note (Signed)
1.  Stage IV bladder cancer with lung metastasis (DX 06/29/2017): -Status post 6 cycles of carboplatin and gemcitabine completed on 11/02/2017 -PET/CT scan on 11/18/2017 showing significant partial metabolic response -Maintenance gemcitabine day 1 and day 15 every 28 days started on 12/03/2017 -I discussed the results of the PET CT scan with the patient and his daughter in detail.  There is worsening of pulmonary metastatic disease.  Bilateral hydronephrosis and right ureteral mass are stable.  I have recommended a change in treatment at this time.  We talked about starting him on pembrolizumab 200 mg every 3 weeks until progression.  We talked about immune related side effects in detail.  We plan to start him tomorrow.  2.  CKD: His creatinine has gone up to 3.6 today.  This has been consistently going up since January of this year.  He is also having hematuria with clots.  He reportedly underwent cystoscopy last Tuesday by Dr. Diona Fanti.  I would contact him about future plan.  He might need percutaneous nephrostomy tubes.  He was referred to interventional radiology for stent placement by his urologist last year, but patient chose to defer the procedure.    3.  Anemia: This is from a combination of blood loss, chronic kidney disease and chemotherapy.  Today his hemoglobin is down to 5.2.  We will arrange for blood transfusion.

## 2018-03-08 NOTE — Progress Notes (Signed)
START ON PATHWAY REGIMEN - Bladder     A cycle is 21 days:     Pembrolizumab   **Always confirm dose/schedule in your pharmacy ordering system**    Patient Characteristics: Metastatic Disease, Second Line AJCC M Category: M1b AJCC N Category: NX AJCC T Category: TX Current evidence of distant metastases<= Yes AJCC 8 Stage Grouping: IVB Line of Therapy: Second Line  Intent of Therapy: Non-Curative / Palliative Intent, Discussed with Patient

## 2018-03-08 NOTE — Addendum Note (Signed)
Addended by: Joanne Gavel T on: 03/08/2018 04:03 PM   Modules accepted: Orders

## 2018-03-09 ENCOUNTER — Inpatient Hospital Stay (HOSPITAL_COMMUNITY): Payer: Medicare Other

## 2018-03-09 VITALS — BP 197/91 | HR 77 | Temp 98.5°F | Resp 20 | Wt 175.6 lb

## 2018-03-09 DIAGNOSIS — Z5112 Encounter for antineoplastic immunotherapy: Secondary | ICD-10-CM | POA: Diagnosis not present

## 2018-03-09 DIAGNOSIS — Z5189 Encounter for other specified aftercare: Secondary | ICD-10-CM

## 2018-03-09 DIAGNOSIS — C670 Malignant neoplasm of trigone of bladder: Secondary | ICD-10-CM

## 2018-03-09 DIAGNOSIS — C679 Malignant neoplasm of bladder, unspecified: Secondary | ICD-10-CM

## 2018-03-09 MED ORDER — ACETAMINOPHEN 325 MG PO TABS
650.0000 mg | ORAL_TABLET | Freq: Once | ORAL | Status: AC
  Administered 2018-03-09: 650 mg via ORAL

## 2018-03-09 MED ORDER — HEPARIN SOD (PORK) LOCK FLUSH 100 UNIT/ML IV SOLN
500.0000 [IU] | Freq: Every day | INTRAVENOUS | Status: AC | PRN
  Administered 2018-03-09: 500 [IU]
  Filled 2018-03-09: qty 5

## 2018-03-09 MED ORDER — DIPHENHYDRAMINE HCL 25 MG PO CAPS
ORAL_CAPSULE | ORAL | Status: AC
  Filled 2018-03-09: qty 1

## 2018-03-09 MED ORDER — ACETAMINOPHEN 325 MG PO TABS
ORAL_TABLET | ORAL | Status: AC
  Filled 2018-03-09: qty 2

## 2018-03-09 MED ORDER — SODIUM CHLORIDE 0.9 % IV SOLN
250.0000 mL | Freq: Once | INTRAVENOUS | Status: AC
  Administered 2018-03-09: 250 mL via INTRAVENOUS

## 2018-03-09 MED ORDER — FUROSEMIDE 10 MG/ML IJ SOLN
20.0000 mg | Freq: Once | INTRAMUSCULAR | Status: AC
  Administered 2018-03-09: 20 mg via INTRAVENOUS
  Filled 2018-03-09: qty 2

## 2018-03-09 MED ORDER — DIPHENHYDRAMINE HCL 25 MG PO CAPS
25.0000 mg | ORAL_CAPSULE | Freq: Once | ORAL | Status: AC
  Administered 2018-03-09: 25 mg via ORAL

## 2018-03-09 MED ORDER — SODIUM CHLORIDE 0.9 % IV SOLN
200.0000 mg | Freq: Once | INTRAVENOUS | Status: AC
  Administered 2018-03-09: 200 mg via INTRAVENOUS
  Filled 2018-03-09: qty 8

## 2018-03-09 MED ORDER — SODIUM CHLORIDE 0.9 % IV SOLN
Freq: Once | INTRAVENOUS | Status: AC
  Administered 2018-03-09: 14:00:00 via INTRAVENOUS

## 2018-03-09 MED ORDER — SODIUM CHLORIDE 0.9% FLUSH: 10.0000 mL | INTRAVENOUS | Status: DC | PRN

## 2018-03-09 NOTE — Progress Notes (Signed)
Tolerated blood transfusion and Keytruda infusion w/o adverse reaction.  Alert, in no distress.  VSS.  Discharged via wheelchair.

## 2018-03-10 ENCOUNTER — Encounter (HOSPITAL_COMMUNITY): Payer: Self-pay | Admitting: *Deleted

## 2018-03-10 ENCOUNTER — Other Ambulatory Visit: Payer: Self-pay | Admitting: Urology

## 2018-03-10 DIAGNOSIS — N1330 Unspecified hydronephrosis: Secondary | ICD-10-CM

## 2018-03-10 LAB — BPAM RBC
BLOOD PRODUCT EXPIRATION DATE: 201904182359
BLOOD PRODUCT EXPIRATION DATE: 201904192359
ISSUE DATE / TIME: 201904091042
ISSUE DATE / TIME: 201904091230
Unit Type and Rh: 9500
Unit Type and Rh: 9500

## 2018-03-10 LAB — TYPE AND SCREEN
ABO/RH(D): O POS
ANTIBODY SCREEN: NEGATIVE
UNIT DIVISION: 0
Unit division: 0

## 2018-03-10 NOTE — Progress Notes (Signed)
Telephoned pt for 24 hour f/u after receiving first Keytruda infusion.  Voicemail message left for pt to call the clinic with any questions or concerns.

## 2018-03-12 ENCOUNTER — Other Ambulatory Visit: Payer: Self-pay | Admitting: Radiology

## 2018-03-15 ENCOUNTER — Ambulatory Visit (HOSPITAL_COMMUNITY): Payer: Medicare Other

## 2018-03-15 ENCOUNTER — Other Ambulatory Visit: Payer: Self-pay | Admitting: Urology

## 2018-03-16 ENCOUNTER — Other Ambulatory Visit (HOSPITAL_COMMUNITY): Payer: Medicare Other

## 2018-03-16 ENCOUNTER — Ambulatory Visit (HOSPITAL_COMMUNITY): Payer: Medicare Other

## 2018-03-18 ENCOUNTER — Ambulatory Visit (HOSPITAL_COMMUNITY)
Admission: RE | Admit: 2018-03-18 | Discharge: 2018-03-18 | Disposition: A | Discharge status: Home / Self Care | Payer: Medicare Other | Source: Ambulatory Visit | Attending: Internal Medicine | Admitting: Internal Medicine

## 2018-03-18 ENCOUNTER — Other Ambulatory Visit (HOSPITAL_COMMUNITY): Payer: Self-pay | Admitting: Internal Medicine

## 2018-03-18 DIAGNOSIS — I739 Peripheral vascular disease, unspecified: Secondary | ICD-10-CM | POA: Diagnosis not present

## 2018-03-18 DIAGNOSIS — M25552 Pain in left hip: Secondary | ICD-10-CM

## 2018-03-22 ENCOUNTER — Ambulatory Visit (HOSPITAL_COMMUNITY): Payer: Medicare Other

## 2018-03-24 ENCOUNTER — Other Ambulatory Visit: Payer: Self-pay | Admitting: Physician Assistant

## 2018-03-25 ENCOUNTER — Other Ambulatory Visit (HOSPITAL_COMMUNITY): Payer: Self-pay | Admitting: Interventional Radiology

## 2018-03-25 ENCOUNTER — Encounter (HOSPITAL_COMMUNITY): Payer: Self-pay

## 2018-03-25 ENCOUNTER — Ambulatory Visit (HOSPITAL_COMMUNITY)
Admission: RE | Admit: 2018-03-25 | Discharge: 2018-03-25 | Disposition: A | Discharge status: Home / Self Care | Payer: Medicare Other | Source: Ambulatory Visit | Attending: Urology | Admitting: Urology

## 2018-03-25 DIAGNOSIS — N130 Hydronephrosis with ureteropelvic junction obstruction: Secondary | ICD-10-CM | POA: Diagnosis not present

## 2018-03-25 DIAGNOSIS — H9193 Unspecified hearing loss, bilateral: Secondary | ICD-10-CM | POA: Diagnosis not present

## 2018-03-25 DIAGNOSIS — Z79899 Other long term (current) drug therapy: Secondary | ICD-10-CM | POA: Insufficient documentation

## 2018-03-25 DIAGNOSIS — I110 Hypertensive heart disease with heart failure: Secondary | ICD-10-CM | POA: Diagnosis not present

## 2018-03-25 DIAGNOSIS — Z9889 Other specified postprocedural states: Secondary | ICD-10-CM | POA: Diagnosis not present

## 2018-03-25 DIAGNOSIS — Z8551 Personal history of malignant neoplasm of bladder: Secondary | ICD-10-CM | POA: Diagnosis not present

## 2018-03-25 DIAGNOSIS — M1990 Unspecified osteoarthritis, unspecified site: Secondary | ICD-10-CM | POA: Diagnosis not present

## 2018-03-25 DIAGNOSIS — J449 Chronic obstructive pulmonary disease, unspecified: Secondary | ICD-10-CM | POA: Insufficient documentation

## 2018-03-25 DIAGNOSIS — I509 Heart failure, unspecified: Secondary | ICD-10-CM | POA: Diagnosis not present

## 2018-03-25 DIAGNOSIS — Z888 Allergy status to other drugs, medicaments and biological substances status: Secondary | ICD-10-CM | POA: Insufficient documentation

## 2018-03-25 DIAGNOSIS — F329 Major depressive disorder, single episode, unspecified: Secondary | ICD-10-CM | POA: Insufficient documentation

## 2018-03-25 DIAGNOSIS — N1330 Unspecified hydronephrosis: Secondary | ICD-10-CM

## 2018-03-25 HISTORY — PX: IR NEPHROSTOMY PLACEMENT LEFT: IMG6063

## 2018-03-25 HISTORY — PX: IR NEPHROSTOMY PLACEMENT RIGHT: IMG6064

## 2018-03-25 LAB — COMPREHENSIVE METABOLIC PANEL
ALT: 8 U/L — AB (ref 17–63)
AST: 18 U/L (ref 15–41)
Albumin: 3.1 g/dL — ABNORMAL LOW (ref 3.5–5.0)
Alkaline Phosphatase: 59 U/L (ref 38–126)
Anion gap: 12 (ref 5–15)
BUN: 43 mg/dL — AB (ref 6–20)
CHLORIDE: 107 mmol/L (ref 101–111)
CO2: 23 mmol/L (ref 22–32)
CREATININE: 3.49 mg/dL — AB (ref 0.61–1.24)
Calcium: 8.7 mg/dL — ABNORMAL LOW (ref 8.9–10.3)
GFR calc Af Amer: 19 mL/min — ABNORMAL LOW (ref >60)
GFR calc non Af Amer: 16 mL/min — ABNORMAL LOW (ref >60)
GLUCOSE: 91 mg/dL (ref 65–99)
POTASSIUM: 3.7 mmol/L (ref 3.5–5.1)
SODIUM: 142 mmol/L (ref 135–145)
Total Bilirubin: 0.3 mg/dL (ref 0.3–1.2)
Total Protein: 7.1 g/dL (ref 6.5–8.1)

## 2018-03-25 LAB — CBC WITH DIFFERENTIAL/PLATELET
BASOS PCT: 0 %
Basophils Absolute: 0 10*3/uL (ref 0.0–0.1)
EOS PCT: 3 %
Eosinophils Absolute: 0.2 10*3/uL (ref 0.0–0.7)
HCT: 29.9 % — ABNORMAL LOW (ref 39.0–52.0)
Hemoglobin: 9.5 g/dL — ABNORMAL LOW (ref 13.0–17.0)
LYMPHS PCT: 12 %
Lymphs Abs: 0.9 10*3/uL (ref 0.7–4.0)
MCH: 32.4 pg (ref 26.0–34.0)
MCHC: 31.8 g/dL (ref 30.0–36.0)
MCV: 102 fL — AB (ref 78.0–100.0)
MONO ABS: 0.7 10*3/uL (ref 0.1–1.0)
Monocytes Relative: 10 %
Neutro Abs: 5.3 10*3/uL (ref 1.7–7.7)
Neutrophils Relative %: 75 %
PLATELETS: 280 10*3/uL (ref 150–400)
RBC: 2.93 MIL/uL — AB (ref 4.22–5.81)
RDW: 15.9 % — AB (ref 11.5–15.5)
WBC: 7 10*3/uL (ref 4.0–10.5)

## 2018-03-25 LAB — PROTIME-INR
INR: 0.94
PROTHROMBIN TIME: 12.5 s (ref 11.4–15.2)

## 2018-03-25 MED ORDER — HYDRALAZINE HCL 20 MG/ML IJ SOLN: 10.0000 mg | INTRAMUSCULAR | Status: DC | PRN

## 2018-03-25 MED ORDER — CIPROFLOXACIN IN D5W 400 MG/200ML IV SOLN
400.0000 mg | INTRAVENOUS | Status: AC
  Administered 2018-03-25: 400 mg via INTRAVENOUS

## 2018-03-25 MED ORDER — MIDAZOLAM HCL 2 MG/2ML IJ SOLN
INTRAMUSCULAR | Status: AC | PRN
  Administered 2018-03-25: 0.5 mg via INTRAVENOUS
  Administered 2018-03-25: 1 mg via INTRAVENOUS
  Administered 2018-03-25: 0.5 mg via INTRAVENOUS

## 2018-03-25 MED ORDER — HYDRALAZINE HCL 20 MG/ML IJ SOLN
INTRAMUSCULAR | Status: AC
  Filled 2018-03-25: qty 1

## 2018-03-25 MED ORDER — MIDAZOLAM HCL 2 MG/2ML IJ SOLN
INTRAMUSCULAR | Status: AC
  Filled 2018-03-25: qty 4

## 2018-03-25 MED ORDER — FENTANYL CITRATE (PF) 100 MCG/2ML IJ SOLN
INTRAMUSCULAR | Status: AC
  Filled 2018-03-25: qty 4

## 2018-03-25 MED ORDER — SODIUM CHLORIDE 0.9 % IV SOLN
INTRAVENOUS | Status: DC
  Administered 2018-03-25: 08:00:00 via INTRAVENOUS

## 2018-03-25 MED ORDER — IOPAMIDOL (ISOVUE-300) INJECTION 61%
50.0000 mL | Freq: Once | INTRAVENOUS | Status: AC | PRN
  Administered 2018-03-25: 15 mL

## 2018-03-25 MED ORDER — HYDROCODONE-ACETAMINOPHEN 5-325 MG PO TABS
1.0000 | ORAL_TABLET | ORAL | Status: DC | PRN
Start: 2018-03-25 — End: 2018-03-26
  Administered 2018-03-25: 1 via ORAL
  Filled 2018-03-25: qty 1

## 2018-03-25 MED ORDER — CIPROFLOXACIN IN D5W 400 MG/200ML IV SOLN
INTRAVENOUS | Status: AC
  Filled 2018-03-25: qty 200

## 2018-03-25 MED ORDER — FENTANYL CITRATE (PF) 100 MCG/2ML IJ SOLN
INTRAMUSCULAR | Status: AC | PRN
  Administered 2018-03-25 (×2): 25 ug via INTRAVENOUS
  Administered 2018-03-25: 50 ug via INTRAVENOUS

## 2018-03-25 MED ORDER — IOPAMIDOL (ISOVUE-300) INJECTION 61%
INTRAVENOUS | Status: AC
  Administered 2018-03-25: 15 mL
  Filled 2018-03-25: qty 50

## 2018-03-25 MED ORDER — HYDRALAZINE HCL 20 MG/ML IJ SOLN
INTRAMUSCULAR | Status: AC | PRN
  Administered 2018-03-25: 10 mg via INTRAVENOUS

## 2018-03-25 MED ORDER — LIDOCAINE HCL 1 % IJ SOLN
INTRAMUSCULAR | Status: AC
  Filled 2018-03-25: qty 20

## 2018-03-25 NOTE — H&P (Signed)
Referring Physician(s): [place]  Supervising Physician: Jacqulynn Cadet  Patient Status:  [facility]  Chief Complaint:  "I'm having kidney tubes put in "  Subjective: Pt familiar to IR service from prior port a cath placement on 06/18/17. He has a history of recurrent metastatic bladder cancer, now with hematuria, worsening function and bilateral hydronephrosis.  He presents today for bilateral percutaneous nephrostomies.  He currently denies fever, chest pain, abdominal/back pain.  He does have headaches, dyspnea with exertion, cough, left hip pain.  He continues to smoke. Past Medical History:  Diagnosis Date  . Arthritis   . Cancer [facility])    Bladder Cancer  . CHF (congestive heart failure) ([place])   . COPD (chronic obstructive pulmonary disease) ([place])   . Depression   . Hard of hearing    bilat   . Hypertension   . Malignant neoplasm of trigone of bladder ([place]) 03/25/2016  . PONV (postoperative nausea and vomiting)   . Shortness of breath dyspnea    heat; walking up set of stairs; pt states can walk up 12-14 stairs slowly without having to stop to catch his breath   Past Surgical History:  Procedure Laterality Date  . CYSTOSCOPY N/A 06/16/2016   Procedure: CYSTOSCOPY;  Surgeon: Franchot Gallo, MD;  Location: [facility];  Service: Urology;  Laterality: N/A;  . IR FLUORO GUIDE PORT INSERTION RIGHT  06/18/2017  . IR US GUIDE VASC ACCESS RIGHT  06/18/2017  . TRANSURETHRAL RESECTION OF BLADDER TUMOR N/A 03/25/2016   Procedure: TRANSURETHRAL RESECTION OF BLADDER TUMOR (TURBT);  Surgeon: Franchot Gallo, MD;  Location: [facility];  Service: Urology;  Laterality: N/A;  . TRANSURETHRAL RESECTION OF BLADDER TUMOR N/A 06/16/2016   Procedure: TRANSURETHRAL RESECTION OF BLADDER TUMOR (TURBT);  Surgeon: Franchot Gallo, MD;  Location: [facility];  Service: Urology;  Laterality: N/A;  . TRANSURETHRAL RESECTION OF BLADDER TUMOR N/A 12/16/2016   Procedure: TRANSURETHRAL RESECTION OF BLADDER  TUMOR (TURBT);  Surgeon: Franchot Gallo, MD;  Location: [facility];  Service: Urology;  Laterality: N/A;  . TRANSURETHRAL RESECTION OF BLADDER TUMOR N/A 05/11/2017   Procedure: TRANSURETHRAL RESECTION OF BLADDER TUMOR (TURBT) CLOT EVACUATION;  Surgeon: Franchot Gallo, MD;  Location: [facility];  Service: Urology;  Laterality: N/A;  1 HR [phone] BLUE [id number]      Allergies: Carboplatin  Medications: Prior to Admission medications   Medication Sig Start Date End Date Taking? Authorizing Provider  acetaminophen (TYLENOL) 500 MG tablet Take 500 mg by mouth as needed (PRN for pain).   Yes [provider]  Atezolizumab (TECENTRIQ IV) Inject into the vein. Every 21 days   Yes [provider]  carvedilol (COREG) 25 MG tablet Take 25 mg by mouth 2 (two) times daily.  01/31/16  Yes [provider]  cetirizine (ZYRTEC) 10 MG tablet Take 10 mg by mouth daily.   Yes [provider]  citalopram (CELEXA) 20 MG tablet Take 20 mg by mouth at bedtime.  01/31/16  Yes [provider]  fluticasone (FLONASE) 50 MCG/ACT nasal spray Place 2 sprays into both nostrils daily as needed for allergies or rhinitis.   Yes [provider]  ondansetron (ZOFRAN-ODT) 4 MG disintegrating tablet Take 1 tablet (4 mg total) by mouth every 8 (eight) hours as needed for nausea or vomiting. 02/24/18  Yes Derek Jack, MD  oxybutynin (DITROPAN) 5 MG tablet Take 1 tablet (5 mg total) by mouth every 6 (six) hours as needed for bladder spasms. 05/12/17  Yes McKenzie, Candee Furbish, MD  Diphenhyd-Hydrocort-Nystatin (FIRST-DUKES MOUTHWASH) SUSP Use as directed 5 mLs in the mouth or throat 4 (four) times daily -  before meals and at bedtime. Patient not taking: Reported on 03/08/2018 07/21/17   Holley Bouche, NP  lidocaine (XYLOCAINE) 2 % solution Swish and swallow 64ml three to four times a day as needed for throat pain. Patient not taking: Reported on 03/08/2018  08/17/17   Twana First, MD  prochlorperazine (COMPAZINE) 10 MG tablet Take 1 tablet (10 mg total) by mouth every 6 (six) hours as needed for nausea or vomiting. 12/17/17   Holley Bouche, NP     Vital Signs: Blood pressure 190/91, temp 99.2, heart rate 86, respirations 18, O2 sat 93% RA   Physical Exam awake, alert.  Chest with scattered wheezes.  Heart with regular rate and rhythm.  Abdomen soft, positive bowel sounds, nontender.  No significant lower extremity edema.  Imaging: No results found.  Labs:  CBC: Recent Labs    01/25/18 0932 02/22/18 1053 03/08/18 1058 03/25/18 0745  WBC 6.3 9.2 6.8 7.0  HGB 7.6* 7.5* 5.9* 9.5*  HCT 24.6* 23.3* 19.3* 29.9*  PLT 361 231 330 280    COAGS: Recent Labs    05/09/17 0714 06/18/17 1152 03/25/18 0745  INR 0.96 1.03 0.94  APTT  --  33  --     BMP: Recent Labs    01/25/18 0932 02/22/18 1053 03/08/18 1058 03/25/18 0745  NA 138 137 140 142  K 3.8 3.3* 3.6 3.7  CL 106 104 107 107  CO2 23 22 24 23   GLUCOSE 141* 107* 90 91  BUN 34* 36* 41* 43*  CALCIUM 8.4* 7.9* 7.5* 8.7*  CREATININE 2.61* 3.30* 3.61* 3.49*  GFRNONAA 23* 18* 16* 16*  GFRAA 27* 20* 18* 19*    LIVER FUNCTION TESTS: Recent Labs    01/25/18 0932 02/22/18 1053 03/08/18 1058 03/25/18 0745  BILITOT 0.3 0.5 0.4 0.3  AST 19 15 11* 18  ALT 8* 7* 7* 8*  ALKPHOS 66 60 56 59  PROT 6.2* 6.0* 6.0* 7.1  ALBUMIN 2.7* 2.5* 2.3* 3.1*    Assessment and Plan:  Pt with history of recurrent metastatic bladder cancer; now with hematuria, worsening function and bilateral hydronephrosis.  He presents today for bilateral percutaneous nephrostomies. Risks and benefits of procedure were discussed with the patient including, but not limited to, infection, bleeding, significant bleeding causing loss or decrease in renal function or damage to adjacent structures.   All of the patient's questions were answered, patient is agreeable to proceed.  Consent signed and in  chart.      Electronically Signed: D. Rowe Robert, PA-C 03/25/2018, 8:39 AM   I spent a total of 25 minutes at the the patient's bedside AND on the patient's hospital floor or unit, greater than 50% of which was counseling/coordinating care for bilateral percutaneous nephrostomies

## 2018-03-25 NOTE — Procedures (Signed)
Interventional Radiology Procedure Note  Procedure: Placement of bilateral 19J PCNs  Complications: None immediate  Estimated Blood Loss: < 25 mL  Recommendations: - Tubes to bag drainage - Observe for 4 hrs, watch for signs of bleeding/bacteremia - DC home  Signed,  Criselda Peaches, MD

## 2018-03-25 NOTE — Discharge Instructions (Signed)
Moderate Conscious Sedation, Adult, Care After These instructions provide you with information about caring for yourself after your procedure. Your health care provider may also give you more specific instructions. Your treatment has been planned according to current medical practices, but problems sometimes occur. Call your health care provider if you have any problems or questions after your procedure. What can I expect after the procedure? After your procedure, it is common:  To feel sleepy for several hours.  To feel clumsy and have poor balance for several hours.  To have poor judgment for several hours.  To vomit if you eat too soon.  Follow these instructions at home: For at least 24 hours after the procedure:   Do not: ? Participate in activities where you could fall or become injured. ? Drive. ? Use heavy machinery. ? Drink alcohol. ? Take sleeping pills or medicines that cause drowsiness. ? Make important decisions or sign legal documents. ? Take care of children on your own.  Rest. Eating and drinking  Follow the diet recommended by your health care provider.  If you vomit: ? Drink water, juice, or soup when you can drink without vomiting. ? Make sure you have little or no nausea before eating solid foods. General instructions  Have a responsible adult stay with you until you are awake and alert.  Take over-the-counter and prescription medicines only as told by your health care provider.  If you smoke, do not smoke without supervision.  Keep all follow-up visits as told by your health care provider. This is important. Contact a health care provider if:  You keep feeling nauseous or you keep vomiting.  You feel light-headed.  You develop a rash.  You have a fever. Get help right away if:  You have trouble breathing. This information is not intended to replace advice given to you by your health care provider. Make sure you discuss any questions you have  with your health care provider. Document Released: 09/07/2013 Document Revised: 04/21/2016 Document Reviewed: 03/08/2016 Elsevier Interactive Patient Education  2018 [employer].    Percutaneous Nephrostomy, Care After This sheet gives you information about how to care for yourself after your procedure. Your health care provider may also give you more specific instructions. If you have problems or questions, contact your health care provider. What can I expect after the procedure? After the procedure, it is common to have:  Some soreness where the nephrostomy tube was inserted (tube insertion site).  Blood-tinged drainage from the nephrostomy tube for the first 24 hours.  Follow these instructions at home: Activity  Return to your normal activities as told by your health care provider. Ask your health care provider what activities are safe for you.  Avoid activities that may cause the nephrostomy tubing to bend.  Do not take baths, swim, or use a hot tub until your health care provider approves. Ask your health care provider if you can take showers. Cover the nephrostomy tube dressing with a watertight covering when you take a shower.  Donot drive for 24 hours if you were given a medicine to help you relax (sedative). Care of the tube insertion site  Follow instructions from your health care provider about how to take care of your tube insertion site. Make sure you: ? Wash your hands with soap and water before you change your bandage (dressing). If soap and water are not available, use hand sanitizer. ? Change your dressing as told by your health care provider. Be careful not  to pull on the tube while removing the dressing. ? When you change the dressing, wash the skin around the tube, rinse well, and pat the skin dry.  How do I change the dressing around the nephrostomy tube? Change your dressing and clean your tube exit site as told by your health care provider. You may need to  change the dressing every day for the first 2 weeks after having a nephrostomy tube inserted. After the first 2 weeks, you may be told to change the dressing two times a week. Supplies needed:  Mild soap and water.  Split gauze pads, 4  4 inches (10 x 10 cm).  Gauze pads, 4  4 inches (10 x 10 cm).            Paper tape.  How to change the dressing: Because of the location of your nephrostomy tube, you may need help from another person to complete dressing changes. Follow these basic steps: 1. Wash hands with soap and water. 2. Gently remove the tape and dressing from around the nephrostomy tube. Be careful not to pull on the tube while removing the dressing. Avoid using scissors because they may damage the tube. 3. Wash the skin around the tube with mild soap and water, rinse well, and pat the skin dry with a clean cloth. 4. Check the skin around the drain for redness, swelling, pus, warmth, or a bad smell. 5. If the drain was sutured to the skin, check the suture to verify that it is still anchored in the skin. 6. Place two split gauze pads in and around the tube exit site. Do not apply ointments or alcohol to the site. 7. Place a gauze pad on top of the split gauze pad. 8. Coil the tube on top of the gauze. The tubing should rest on the gauze, not on the skin. 9. Place tape around each edge of the gauze pad. 10. Secure the nephrostomy tubing. Make sure that the tube does not kink or become pinched. The tubing should rest on the gauze pad, not on the skin. 11. Dispose of used supplies properly.   Check the tube insertion area every day for signs of infection. Check for: ? More redness, swelling, or pain. ? More fluid or blood. ? Warmth. ? Pus or a bad smell. Care of the nephrostomy tube and drainage bag  Always keep the tubing, the leg bag, or the bedside drainage bags below the level of the kidney so that your urine drains freely.  When connecting your nephrostomy tube to a  drainage bag, make sure that there are no kinks in the tubing and that your urine is draining freely. You may want to use an elastic bandage to wrap any exposed tubing that goes from the nephrostomy tube to any of the connecting tubes.  At night, you may want to connect your nephrostomy tube or the leg bag to a larger bedside drainage bag.  Follow instructions from your health care provider about how to empty or change the drainage bag.  Empty the drainage bag when it becomes ? full.  Replace the drainage bag and any extension tubing that is connected to your nephrostomy tube every 3 weeks or as often as told by your health care provider. Your health care provider will explain how to change the drainage bag and extension tubing. How do I empty the drainage bag? Empty the leg bag or bedside drainage bag whenever it becomes ? full. Also empty it before  you go to sleep. Most drainage bags have a drain at the bottom that allows urine to be emptied. Follow these basic steps: 1. Hold the drainage bag over a toilet or collection container. Use a measuring container if your health care provider told you to measure your urine. 2. Open the drain of the bag and allow the urine to drain out. 3. After all the urine has drained from the drainage bag, close the drain fully. 4. Flush the urine down the toilet. If a collection container was used, rinse the container. General instructions  Take over-the-counter and prescription medicines only as told by your health care provider.  Keep all follow-up visits as told by your health care provider. This is important. Contact a health care provider if:  You have problems with any of the valves or tubing.  You have persistent pain or soreness in your back.  You have more redness, swelling, or pain around your tube insertion site.  You have more fluid or blood coming from your tube insertion site.  Your tube insertion site feels warm to the touch.  You have  pus or a bad smell coming from your tube insertion site.  You have increased urine output or you feel burning when urinating. Get help right away if:  You have pain in your abdomen during the first week.  You have chest pain or have trouble breathing.  You have a new appearance of blood in your urine.  You have a fever or chills.  You have back pain that is not relieved by your medicine.  You have decreased urine output.  Your nephrostomy tube comes out. This information is not intended to replace advice given to you by your health care provider. Make sure you discuss any questions you have with your health care provider. Document Released: 07/10/2004 Document Revised: 08/29/2016 Document Reviewed: 08/29/2016 Elsevier Interactive Patient Education  [employer].

## 2018-03-25 NOTE — Sedation Documentation (Signed)
Dr. Laurence Ferrari made aware of pt's improved BP. No additional BP medications orders.

## 2018-03-29 ENCOUNTER — Other Ambulatory Visit (HOSPITAL_COMMUNITY): Payer: Medicare Other

## 2018-03-30 ENCOUNTER — Encounter (HOSPITAL_COMMUNITY): Payer: Self-pay | Admitting: Hematology

## 2018-03-30 ENCOUNTER — Inpatient Hospital Stay (HOSPITAL_COMMUNITY): Payer: Medicare Other

## 2018-03-30 ENCOUNTER — Encounter (HOSPITAL_COMMUNITY): Payer: Self-pay

## 2018-03-30 ENCOUNTER — Encounter (HOSPITAL_COMMUNITY)
Admission: RE | Admit: 2018-03-30 | Discharge: 2018-03-30 | Disposition: A | Discharge status: Home / Self Care | Payer: Medicare Other | Source: Ambulatory Visit | Attending: Urology | Admitting: Urology

## 2018-03-30 ENCOUNTER — Inpatient Hospital Stay (HOSPITAL_BASED_OUTPATIENT_CLINIC_OR_DEPARTMENT_OTHER): Payer: Medicare Other | Admitting: Hematology

## 2018-03-30 VITALS — BP 195/77 | HR 70 | Temp 97.7°F | Resp 20 | Wt 162.0 lb

## 2018-03-30 DIAGNOSIS — Z936 Other artificial openings of urinary tract status: Secondary | ICD-10-CM

## 2018-03-30 DIAGNOSIS — C7800 Secondary malignant neoplasm of unspecified lung: Secondary | ICD-10-CM | POA: Diagnosis not present

## 2018-03-30 DIAGNOSIS — Z5112 Encounter for antineoplastic immunotherapy: Secondary | ICD-10-CM

## 2018-03-30 DIAGNOSIS — C679 Malignant neoplasm of bladder, unspecified: Secondary | ICD-10-CM

## 2018-03-30 DIAGNOSIS — D649 Anemia, unspecified: Secondary | ICD-10-CM

## 2018-03-30 DIAGNOSIS — C670 Malignant neoplasm of trigone of bladder: Secondary | ICD-10-CM | POA: Diagnosis not present

## 2018-03-30 DIAGNOSIS — Z720 Tobacco use: Secondary | ICD-10-CM

## 2018-03-30 DIAGNOSIS — N189 Chronic kidney disease, unspecified: Secondary | ICD-10-CM

## 2018-03-30 LAB — COMPREHENSIVE METABOLIC PANEL
ALBUMIN: 2.9 g/dL — AB (ref 3.5–5.0)
ALT: 7 U/L — AB (ref 17–63)
ANION GAP: 12 (ref 5–15)
AST: 18 U/L (ref 15–41)
Alkaline Phosphatase: 61 U/L (ref 38–126)
BUN: 45 mg/dL — ABNORMAL HIGH (ref 6–20)
CHLORIDE: 103 mmol/L (ref 101–111)
CO2: 24 mmol/L (ref 22–32)
Calcium: 8.4 mg/dL — ABNORMAL LOW (ref 8.9–10.3)
Creatinine, Ser: 3.63 mg/dL — ABNORMAL HIGH (ref 0.61–1.24)
GFR calc non Af Amer: 16 mL/min — ABNORMAL LOW (ref >60)
GFR, EST AFRICAN AMERICAN: 18 mL/min — AB (ref >60)
GLUCOSE: 107 mg/dL — AB (ref 65–99)
Potassium: 3.6 mmol/L (ref 3.5–5.1)
SODIUM: 139 mmol/L (ref 135–145)
Total Bilirubin: 0.5 mg/dL (ref 0.3–1.2)
Total Protein: 6.8 g/dL (ref 6.5–8.1)

## 2018-03-30 LAB — CBC WITH DIFFERENTIAL/PLATELET
BASOS PCT: 0 %
Basophils Absolute: 0 10*3/uL (ref 0.0–0.1)
EOS ABS: 0.1 10*3/uL (ref 0.0–0.7)
EOS PCT: 2 %
HCT: 28.6 % — ABNORMAL LOW (ref 39.0–52.0)
HEMOGLOBIN: 8.9 g/dL — AB (ref 13.0–17.0)
Lymphocytes Relative: 9 %
Lymphs Abs: 0.5 10*3/uL — ABNORMAL LOW (ref 0.7–4.0)
MCH: 31.2 pg (ref 26.0–34.0)
MCHC: 31.1 g/dL (ref 30.0–36.0)
MCV: 100.4 fL — ABNORMAL HIGH (ref 78.0–100.0)
Monocytes Absolute: 0.6 10*3/uL (ref 0.1–1.0)
Monocytes Relative: 12 %
NEUTROS PCT: 77 %
Neutro Abs: 3.8 10*3/uL (ref 1.7–7.7)
PLATELETS: 248 10*3/uL (ref 150–400)
RBC: 2.85 MIL/uL — AB (ref 4.22–5.81)
RDW: 15.5 % (ref 11.5–15.5)
WBC: 5 10*3/uL (ref 4.0–10.5)

## 2018-03-30 LAB — TSH: TSH: 1.887 u[IU]/mL (ref 0.350–4.500)

## 2018-03-30 LAB — SAMPLE TO BLOOD BANK

## 2018-03-30 MED ORDER — SODIUM CHLORIDE 0.9 % IV SOLN
Freq: Once | INTRAVENOUS | Status: AC
  Administered 2018-03-30: 12:00:00 via INTRAVENOUS

## 2018-03-30 MED ORDER — HEPARIN SOD (PORK) LOCK FLUSH 100 UNIT/ML IV SOLN
500.0000 [IU] | Freq: Once | INTRAVENOUS | Status: AC | PRN
  Administered 2018-03-30: 500 [IU]

## 2018-03-30 MED ORDER — SODIUM CHLORIDE 0.9 % IV SOLN
200.0000 mg | Freq: Once | INTRAVENOUS | Status: AC
  Administered 2018-03-30: 200 mg via INTRAVENOUS
  Filled 2018-03-30: qty 8

## 2018-03-30 NOTE — Progress Notes (Signed)
Carl Murphy, [place] [address]   CLINIC:  Medical Oncology/Hematology  PCP:  Carl Murphy, [place] [address] [phone]   REASON FOR VISIT:  Follow-up for metastatic bladder cancer.  CURRENT THERAPY: Pembrolizumab every 3 weeks  BRIEF ONCOLOGIC HISTORY:  Oncology History   Per available records had bladder tumor removed but had extension into the muscularis propria. CT showed no extension beyond the bladder. Has seen 2 urologists in consultation. Carl Murphy about not having a cystectomy and ileal conduit.     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 No findings for local spread of disease or metastatic disease      03/25/2016 Surgery    TURBT 3cm right trigonal bladder tumor. followed by intravesical epirubicin instillation      03/25/2016 Pathology Results    bladder TURBT infiltrative high grade papillary urothelial carcinoma, invades the mulcularis propria (detrusor) no LVI       04/14/2016 Miscellaneous    Dr. Phebe Murphy consultation in [place] for consideration of cystectomy. patient also offered repeat resection with BCG, and CHEMO/XRT      06/17/2016 Pathology Results    TURB- high grade papillary urothelial carcinoma with focal stromal invasion       Radiation Therapy    Planned to be completed on 09/10/2016.      07/22/2016 -  Chemotherapy    The patient had palonosetron (ALOXI) injection 0.25 mg, 0.25 mg, Intravenous,  Once, 2 of 5 cycles Administration: 0.25 mg (07/28/2016)  CARBOplatin (PARAPLATIN) 160 mg in sodium chloride 0.9 % 100 mL chemo infusion, 160 mg (100 % of original dose 160.6 mg), Intravenous,  Once, 2 of 5 cycles Dose modification:   (original dose 160.6 mg, Cycle 1),   (original dose 160.6 mg, Cycle 1),   (original dose 160.6 mg, Cycle 1),   (original dose 160.6 mg, Cycle  2) Administration: 180 mg (07/28/2016)  for chemotherapy treatment.        04/07/2017 Relapse/Recurrence    CT Abd/pelvis: IMPRESSION: 1. Recurrent bladder carcinoma with synchronous disease in the distal right ureter causing severe right hydronephrosis and decreased right renal function. 2. Bilateral renal stones. 3. Aortic atherosclerosis (ICD10-170.0). Coronary artery calcification. 4. Small periumbilical hernia contains a knuckle of unobstructed small bowel.       06/10/2017 PET scan    1. Metachronous urothelial carcinoma within the distal right ureter, as on prior CT. 2. No evidence of nodal metastasis. 3. Bilateral hypermetabolic pulmonary nodules, favoring pulmonary metastasis. Especially given emphysema, 1 or more primary bronchogenic carcinomas cannot be excluded but are felt less likely. 4. Coronary artery atherosclerosis. Aortic Atherosclerosis (ICD10-I70.0). 5. Bladder not well evaluated secondary to hypermetabolic urine within.       06/18/2017 Procedure    Placement of single lumen port a cath via right internal jugular vein. The catheter tip lies at the cavo-atrial junction. A power injectable port a cath was placed and is ready for immediate use.      06/29/2017 -  Chemotherapy    Carboplatin/Gemcitabine every 21 days       08/14/2017 Imaging    CT C/A/P IMPRESSION: 1. Overall stable pulmonary nodules. Knee soft tissue rim around the cavitary lesion in the left upper lobe is slightly thinner. No new pulmonary nodules or progressive findings in the chest. 2. Stable advanced emphysematous changes and pulmonary  scarring. 3. No mediastinal or hilar mass or lymphadenopathy. 4. Improved CT appearance of the bladder. The asymmetric areas of bladder wall enhancement have improved. There is still diffuse irregular bladder wall thickening and distal right ureteral tumor appears stable. Stable right-sided hydroureteronephrosis. 5. Stable cirrhotic changes  involving the liver and stable liver cysts. 6. Stable scattered mesenteric and retroperitoneal and pelvic lymph nodes but no mass or overt adenopathy. 7. Stable severe/advanced atherosclerotic calcifications involving the thoracic and abdominal aorta and branch vessels.      11/18/2017 Imaging    PET CT: IMPRESSION: 1. Significant partial metabolic response. Bilateral mildly hypermetabolic pulmonary metastases are decreased in size and metabolism. No new or progressive hypermetabolic metastatic disease. 2. Stable hypermetabolic distal right ureteral tumor with marked right hydroureteronephrosis. 3. Chronic findings include: Aortic Atherosclerosis (ICD10-I70.0) and Emphysema (ICD10-J43.9). Three-vessel coronary atherosclerosis. Stable small fat containing umbilical hernia.      03/02/2018 Imaging    Whole body PET CT scan  IMPRESSION: 1. Progressive pulmonary metastatic disease with enlarging and progressively hypermetabolic pulmonary nodules as described above. 2. New nodularity at the left lung base is hypermetabolic but could be an inflammatory or infectious process. 3. Stable hypermetabolic tumor in the right distal ureter. 4. No evidence of metastatic disease involving the abdomen/pelvis or osseous structures. 5. Chronic bilateral hydroureteronephrosis and asymmetric bladder wall thickening. 6. Stable severe atherosclerotic disease      03/08/2018 -  Chemotherapy    The patient had pembrolizumab (KEYTRUDA) 200 mg in sodium chloride 0.9 % 50 mL chemo infusion, 200 mg, Intravenous, Once, 2 of 4 cycles Administration: 200 mg (03/09/2018)  for chemotherapy treatment.         CANCER STAGING: Cancer Staging Malignant neoplasm of trigone of bladder ([place]) Staging form: Urinary Bladder, AJCC 7th Edition - Clinical stage from 05/15/2016: Stage II (T2, N0, M0) - Signed by Baird Cancer, PA-C on 08/11/2016 - Pathologic stage from 06/29/2017: Stage IV (T2a, N0, M1) - Signed by  Baird Cancer, PA-C on 06/29/2017    HISTORY OF PRESENT ILLNESS:  [name] 72 y.o. male returns for follow-up and next cycle of embolism.  He denied any nausea, vomiting, diarrhea or skin rashes after his last treatment.  He noticed his appetite has come down.  He reports that he is drinking 1 can of Ensure a week, uses predominantly for constipation.  He had bilateral nephrostomy tubes done on 03/25/2018.  He noticed a right nephrostomy tube is draining bloody urine.  He denies any fevers or infections.  Energy levels are stable.  INTERVAL HISTORY:  Mr. Geise 72 y.o. male here for follow-up.    Here today with family. Due for next cycle of Keytruda today.   He had placement of bilateral PCNs with IR on 03/25/18. (R) PCN has bright red bloody output. (L) PCN has "normal clear" urine output per patient; he occasionally sees blood in the (L) side as well.  Endorses occasional (R) lower back pain at times, particularly with position changes.  He is scheduled with TURBT with Dr. Diona Fanti on 04/01/18.    Denies diarrhea/loose stools. Denies new cough, shortness of breath, no breathing changes. He has chronic cough, which is largely unchanged.  His breathing is worse "with all the pollen."  He is taking Zyrtec; he feels like it is helping. Used to take Claritin, which was not helpful.  His wife is concerned that "he doesn't move very much. He has a walker and cane, but doesn't use them."   Denies  N&V; "I didn't need to take any of the anti-nausea medicines at all."  He has not had any recent blood transfusions.  He feels like he is drinking plenty of fluids.  His appetite is "not good."  He feels like appetite was "always bad, but may have gotten a little worse."  He is drinking Ensure about 1 can per week.  "I use it for a laxative because it makes me have a bowel movement quickly after I drink it."   Recommended he drink at least 1 Ensure/Boost per day.  His family says that he is sleeping a lot and "is  sleeping through meals."        REVIEW OF SYSTEMS:  Review of Systems     PAST MEDICAL/SURGICAL HISTORY:  Past Medical History:  Diagnosis Date  . Arthritis   . Cancer [facility])    Bladder Cancer  . CHF (congestive heart failure) ([place])   . COPD (chronic obstructive pulmonary disease) ([place])   . Depression   . Hard of hearing    bilat   . Hypertension   . Malignant neoplasm of trigone of bladder ([place]) 03/25/2016  . PONV (postoperative nausea and vomiting)   . Shortness of breath dyspnea    heat; walking up set of stairs; pt states can walk up 12-14 stairs slowly without having to stop to catch his breath   Past Surgical History:  Procedure Laterality Date  . CYSTOSCOPY N/A 06/16/2016   Procedure: CYSTOSCOPY;  Surgeon: Franchot Gallo, MD;  Location: [facility];  Service: Urology;  Laterality: N/A;  . IR FLUORO GUIDE PORT INSERTION RIGHT  06/18/2017  . IR NEPHROSTOMY PLACEMENT LEFT  03/25/2018  . IR NEPHROSTOMY PLACEMENT RIGHT  03/25/2018  . IR US GUIDE VASC ACCESS RIGHT  06/18/2017  . TRANSURETHRAL RESECTION OF BLADDER TUMOR N/A 03/25/2016   Procedure: TRANSURETHRAL RESECTION OF BLADDER TUMOR (TURBT);  Surgeon: Franchot Gallo, MD;  Location: [facility];  Service: Urology;  Laterality: N/A;  . TRANSURETHRAL RESECTION OF BLADDER TUMOR N/A 06/16/2016   Procedure: TRANSURETHRAL RESECTION OF BLADDER TUMOR (TURBT);  Surgeon: Franchot Gallo, MD;  Location: [facility];  Service: Urology;  Laterality: N/A;  . TRANSURETHRAL RESECTION OF BLADDER TUMOR N/A 12/16/2016   Procedure: TRANSURETHRAL RESECTION OF BLADDER TUMOR (TURBT);  Surgeon: Franchot Gallo, MD;  Location: [facility];  Service: Urology;  Laterality: N/A;  . TRANSURETHRAL RESECTION OF BLADDER TUMOR N/A 05/11/2017   Procedure: TRANSURETHRAL RESECTION OF BLADDER TUMOR (TURBT) CLOT EVACUATION;  Surgeon: Franchot Gallo, MD;  Location: [facility];  Service: Urology;  Laterality: N/A;  1 HR [phone] BLUE MEDICARE-[id number]     SOCIAL  HISTORY:  Social History   Socioeconomic History  . Marital status: Married    Spouse name: Not on file  . Number of children: Not on file  . Years of education: Not on file  . Highest education level: Not on file  Occupational History  . Not on file  Social Needs  . Financial resource strain: Not on file  . Food insecurity:    Worry: Not on file    Inability: Not on file  . Transportation needs:    Medical: Not on file    Non-medical: Not on file  Tobacco Use  . Smoking status: Current Every Day Smoker    Packs/day: 2.00    Years: 50.00    Pack years: 100.00    Types: Cigarettes  . Smokeless tobacco: Never Used  Substance and Sexual Activity  . Alcohol use: No  Comment: occasionally  . Drug use: No  . Sexual activity: Never    Birth control/protection: None  Lifestyle  . Physical activity:    Days per week: Not on file    Minutes per session: Not on file  . Stress: Not on file  Relationships  . Social connections:    Talks on phone: Not on file    Gets together: Not on file    Attends religious service: Not on file    Active member of club or organization: Not on file    Attends meetings of clubs or organizations: Not on file    Relationship status: Not on file  . Intimate partner violence:    Fear of current or ex partner: Not on file    Emotionally abused: Not on file    Physically abused: Not on file    Forced sexual activity: Not on file  Other Topics Concern  . Not on file  Social History Narrative  . Not on file    FAMILY HISTORY:  Family History  Problem Relation Age of Onset  . Hypertension Mother   . Asthma Father   . Emphysema Father     CURRENT MEDICATIONS:  Outpatient Encounter Medications as of 03/30/2018  Medication Sig  . acetaminophen (TYLENOL) 500 MG tablet Take 500 mg by mouth as needed (PRN for pain).  Huey Bienenstock (TECENTRIQ IV) Inject into the vein. Every 21 days  . carvedilol (COREG) 25 MG tablet Take 25 mg by mouth 2  (two) times daily.   . cetirizine (ZYRTEC) 10 MG tablet Take 10 mg by mouth daily.  . citalopram (CELEXA) 20 MG tablet Take 20 mg by mouth at bedtime.   . Diphenhyd-Hydrocort-Nystatin (FIRST-DUKES MOUTHWASH) SUSP Use as directed 5 mLs in the mouth or throat 4 (four) times daily -  before meals and at bedtime. (Patient not taking: Reported on 03/08/2018)  . fluticasone (FLONASE) 50 MCG/ACT nasal spray Place 2 sprays into both nostrils daily as needed for allergies or rhinitis.  Marland Kitchen lidocaine (XYLOCAINE) 2 % solution Swish and swallow 35ml three to four times a day as needed for throat pain. (Patient not taking: Reported on 03/08/2018)  . ondansetron (ZOFRAN-ODT) 4 MG disintegrating tablet Take 1 tablet (4 mg total) by mouth every 8 (eight) hours as needed for nausea or vomiting.  Marland Kitchen oxybutynin (DITROPAN) 5 MG tablet Take 1 tablet (5 mg total) by mouth every 6 (six) hours as needed for bladder spasms.  . prochlorperazine (COMPAZINE) 10 MG tablet Take 1 tablet (10 mg total) by mouth every 6 (six) hours as needed for nausea or vomiting.   No facility-administered encounter medications on file as of 03/30/2018.     ALLERGIES:  Allergies  Allergen Reactions  . Carboplatin Shortness Of Breath    Reaction occurred after patient left Cancer Center. Pt reported extreme sob after leaving CC that eventually resolved on its own.     PHYSICAL EXAM:  ECOG Performance status: 2  I have reviewed his vitals. LABORATORY DATA:  I have reviewed the labs as listed.  CBC    Component Value Date/Time   WBC 5.0 03/30/2018 0950   RBC 2.85 (L) 03/30/2018 0950   HGB 8.9 (L) 03/30/2018 0950   HCT 28.6 (L) 03/30/2018 0950   PLT 248 03/30/2018 0950   MCV 100.4 (H) 03/30/2018 0950   MCH 31.2 03/30/2018 0950   MCHC 31.1 03/30/2018 0950   RDW 15.5 03/30/2018 0950   LYMPHSABS 0.5 (L) 03/30/2018 0350  MONOABS 0.6 03/30/2018 0950   EOSABS 0.1 03/30/2018 0950   BASOSABS 0.0 03/30/2018 0950   CMP Latest Ref Rng &  Units 03/30/2018 03/25/2018 03/08/2018  Glucose 65 - 99 mg/dL 107(H) 91 90  BUN 6 - 20 mg/dL 45(H) 43(H) 41(H)  Creatinine 0.61 - 1.24 mg/dL 3.63(H) 3.49(H) 3.61(H)  Sodium 135 - 145 mmol/L 139 142 140  Potassium 3.5 - 5.1 mmol/L 3.6 3.7 3.6  Chloride 101 - 111 mmol/L 103 107 107  CO2 22 - 32 mmol/L 24 23 24   Calcium 8.9 - 10.3 mg/dL 8.4(L) 8.7(L) 7.5(L)  Total Protein 6.5 - 8.1 g/dL 6.8 7.1 6.0(L)  Total Bilirubin 0.3 - 1.2 mg/dL 0.5 0.3 0.4  Alkaline Phos 38 - 126 U/L 61 59 56  AST 15 - 41 U/L 18 18 11(L)  ALT 17 - 63 U/L 7(L) 8(L) 7(L)       DIAGNOSTIC IMAGING:  I have personally reviewed the PET CT scan dated 03/02/2018 which showed progression in the lung metastasis.     ASSESSMENT & PLAN:   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  Stage IV bladder cancer with lung metastasis (DX 06/29/2017): -Status post 6 cycles of carboplatin and gemcitabine completed on 11/02/2017 -PET/CT scan on 11/18/2017 showing significant partial metabolic response -Maintenance gemcitabine day 1 and day 15 every 28 days started on 12/03/2017, PET CT scan showing worsening of pulmonary metastatic disease -Pembrolizumab started on 03/09/2018, tolerated very well except decrease in appetite.  Instructed to drink 1 can Ensure daily.  2.  CKD: His creatinine has gone up to 3.6 today.  He underwent bilateral percutaneous nephrostomy tube placement on 03/25/2018.  Right nephrostomy tube is draining bloody secretions.  Likely secondary to tumor in the right ureter.  He has an appointment to see Dr. Diona Fanti for TURBT on Thursday.  3.  Anemia: He had 2 units of blood transfusion 3 weeks ago.  His hemoglobin today is 8.5.  He does not require any transfusion.      Orders placed this encounter:  Orders Placed This Encounter  Procedures  . CBC with Differential  . Comprehensive metabolic panel  . Draw extra clot tube  . TSH  . Sample to Blood Bank    This note includes documentation from Mike Craze, NP, who was present during this patient's office visit and evaluation.  I have reviewed this note for its completeness and accuracy.  I have edited this note accordingly based on my findings and medical opinion.      Derek Jack, MD [place] [phone]

## 2018-03-30 NOTE — Patient Instructions (Signed)
[facility] at [facility] Discharge Instructions  Today you saw Dr. K.   Thank you for choosing [facility] at [facility] to provide your oncology and hematology care.  To afford each patient quality time with our provider, please arrive at least 15 minutes before your scheduled appointment time.   If you have a lab appointment with the Cancer Center please come in thru the  Main Entrance and check in at the main information desk  You need to re-schedule your appointment should you arrive 10 or more minutes late.  We strive to give you quality time with our providers, and arriving late affects you and other patients whose appointments are after yours.  Also, if you no show three or more times for appointments you may be dismissed from the clinic at the providers discretion.     Again, thank you for choosing [facility].  Our hope is that these requests will decrease the amount of time that you wait before being seen by our physicians.       _____________________________________________________________  Should you have questions after your visit to  Cancer Center, please contact our office at [phone] between the hours of 8:30 a.m. and 4:30 p.m.  Voicemails left after 4:30 p.m. will not be returned until the following business day.  For prescription refill requests, have your pharmacy contact our office.       Resources For Cancer Patients and their Caregivers ? American Cancer Society: Can assist with transportation, wigs, general needs, runs Look Good Feel Better.        1-888-227-6333 ? Cancer Care: Provides financial assistance, online support groups, medication/co-pay assistance.  1-800-813-HOPE (4673) ? [facility] Assists [place] cancer patients and their families through emotional , educational and financial support.  336-427-4357 ? [place] DSS Where to apply for food  stamps, Medicaid and utility assistance. 336-342-1394 ? [facility]: Transportation to medical appointments. 336-347-2287 ? Social Security Administration: May apply for disability if have a Stage IV cancer. 336-342-7796 1-800-772-1213 ? [place] Aging, Disability and Transit Services: Assists with nutrition, care and transit needs. 336-349-2343  Cancer Center Support Programs:   > Cancer Support Group  2nd Tuesday of the month 1pm-2pm, Journey Room   > Creative Journey  3rd Tuesday of the month 1130am-1pm, Journey Room    

## 2018-03-30 NOTE — Assessment & Plan Note (Signed)
1.  Stage IV bladder cancer with lung metastasis (DX 06/29/2017): -Status post 6 cycles of carboplatin and gemcitabine completed on 11/02/2017 -PET/CT scan on 11/18/2017 showing significant partial metabolic response -Maintenance gemcitabine day 1 and day 15 every 28 days started on 12/03/2017, PET CT scan showing worsening of pulmonary metastatic disease -Pembrolizumab started on 03/09/2018, tolerated very well except decrease in appetite.  Instructed to drink 1 can Ensure daily.  2.  CKD: His creatinine has gone up to 3.6 today.  He underwent bilateral percutaneous nephrostomy tube placement on 03/25/2018.  Right nephrostomy tube is draining bloody secretions.  Likely secondary to tumor in the right ureter.  He has an appointment to see Dr. Diona Fanti for TURBT on Thursday.  3.  Anemia: He had 2 units of blood transfusion 3 weeks ago.  His hemoglobin today is 8.5.  He does not require any transfusion.

## 2018-03-31 NOTE — Progress Notes (Signed)
Tolerated infusion w/o adverse reaction.  Alert, in no distress.  VSS.  Discharged via wheelchair in c/o spouse.  

## 2018-04-01 ENCOUNTER — Encounter (HOSPITAL_COMMUNITY): Payer: Self-pay | Admitting: *Deleted

## 2018-04-01 ENCOUNTER — Ambulatory Visit (HOSPITAL_COMMUNITY): Payer: Medicare Other | Admitting: Anesthesiology

## 2018-04-01 ENCOUNTER — Encounter (HOSPITAL_COMMUNITY)
Admission: RE | Disposition: A | Discharge status: Home / Self Care | Payer: Self-pay | Source: Ambulatory Visit | Attending: Urology

## 2018-04-01 ENCOUNTER — Ambulatory Visit (HOSPITAL_COMMUNITY)
Admission: RE | Admit: 2018-04-01 | Discharge: 2018-04-01 | Disposition: A | Discharge status: Home / Self Care | Payer: Medicare Other | Source: Ambulatory Visit | Attending: Urology | Admitting: Urology

## 2018-04-01 DIAGNOSIS — Z8551 Personal history of malignant neoplasm of bladder: Secondary | ICD-10-CM | POA: Diagnosis not present

## 2018-04-01 DIAGNOSIS — D494 Neoplasm of unspecified behavior of bladder: Secondary | ICD-10-CM | POA: Diagnosis not present

## 2018-04-01 DIAGNOSIS — M1990 Unspecified osteoarthritis, unspecified site: Secondary | ICD-10-CM | POA: Insufficient documentation

## 2018-04-01 DIAGNOSIS — C670 Malignant neoplasm of trigone of bladder: Secondary | ICD-10-CM | POA: Insufficient documentation

## 2018-04-01 DIAGNOSIS — I509 Heart failure, unspecified: Secondary | ICD-10-CM | POA: Insufficient documentation

## 2018-04-01 DIAGNOSIS — N1330 Unspecified hydronephrosis: Secondary | ICD-10-CM | POA: Diagnosis not present

## 2018-04-01 DIAGNOSIS — Z888 Allergy status to other drugs, medicaments and biological substances status: Secondary | ICD-10-CM | POA: Insufficient documentation

## 2018-04-01 DIAGNOSIS — F329 Major depressive disorder, single episode, unspecified: Secondary | ICD-10-CM | POA: Insufficient documentation

## 2018-04-01 DIAGNOSIS — F17210 Nicotine dependence, cigarettes, uncomplicated: Secondary | ICD-10-CM | POA: Insufficient documentation

## 2018-04-01 DIAGNOSIS — Z923 Personal history of irradiation: Secondary | ICD-10-CM | POA: Diagnosis not present

## 2018-04-01 DIAGNOSIS — I110 Hypertensive heart disease with heart failure: Secondary | ICD-10-CM | POA: Insufficient documentation

## 2018-04-01 DIAGNOSIS — Z9221 Personal history of antineoplastic chemotherapy: Secondary | ICD-10-CM | POA: Diagnosis not present

## 2018-04-01 DIAGNOSIS — C799 Secondary malignant neoplasm of unspecified site: Secondary | ICD-10-CM | POA: Insufficient documentation

## 2018-04-01 DIAGNOSIS — J449 Chronic obstructive pulmonary disease, unspecified: Secondary | ICD-10-CM | POA: Insufficient documentation

## 2018-04-01 DIAGNOSIS — Z936 Other artificial openings of urinary tract status: Secondary | ICD-10-CM | POA: Diagnosis not present

## 2018-04-01 DIAGNOSIS — R310 Gross hematuria: Secondary | ICD-10-CM | POA: Diagnosis not present

## 2018-04-01 HISTORY — PX: TRANSURETHRAL RESECTION OF BLADDER TUMOR: SHX2575

## 2018-04-01 LAB — HEMOGLOBIN AND HEMATOCRIT, BLOOD
HEMATOCRIT: 28.4 % — AB (ref 39.0–52.0)
Hemoglobin: 9 g/dL — ABNORMAL LOW (ref 13.0–17.0)

## 2018-04-01 SURGERY — TURBT (TRANSURETHRAL RESECTION OF BLADDER TUMOR)
Anesthesia: Spinal

## 2018-04-01 MED ORDER — PROPOFOL 10 MG/ML IV BOLUS
INTRAVENOUS | Status: DC | PRN
  Administered 2018-04-01: 10 mg via INTRAVENOUS

## 2018-04-01 MED ORDER — STERILE WATER FOR IRRIGATION IR SOLN
Status: DC | PRN
  Administered 2018-04-01: 500 mL
  Administered 2018-04-01: 3000 mL

## 2018-04-01 MED ORDER — MIDAZOLAM HCL 2 MG/2ML IJ SOLN: 0.5000 mg | Freq: Once | INTRAMUSCULAR | Status: DC | PRN

## 2018-04-01 MED ORDER — BUPIVACAINE IN DEXTROSE 0.75-8.25 % IT SOLN
INTRATHECAL | Status: DC | PRN
  Administered 2018-04-01: 15 mg via INTRATHECAL

## 2018-04-01 MED ORDER — ACETAMINOPHEN 10 MG/ML IV SOLN: 1000.0000 mg | Freq: Once | INTRAVENOUS | Status: DC | PRN

## 2018-04-01 MED ORDER — HYDROMORPHONE HCL 1 MG/ML IJ SOLN
0.2500 mg | INTRAMUSCULAR | Status: DC | PRN
  Administered 2018-04-01: 0.5 mg via INTRAVENOUS
  Filled 2018-04-01: qty 0.5

## 2018-04-01 MED ORDER — PROMETHAZINE HCL 25 MG/ML IJ SOLN: 6.2500 mg | INTRAMUSCULAR | Status: DC | PRN

## 2018-04-01 MED ORDER — IPRATROPIUM-ALBUTEROL 0.5-2.5 (3) MG/3ML IN SOLN
3.0000 mL | Freq: Four times a day (QID) | RESPIRATORY_TRACT | Status: DC
  Administered 2018-04-01: 3 mL via RESPIRATORY_TRACT

## 2018-04-01 MED ORDER — ONDANSETRON HCL 4 MG/2ML IJ SOLN
INTRAMUSCULAR | Status: AC
  Filled 2018-04-01: qty 2

## 2018-04-01 MED ORDER — LACTATED RINGERS IV SOLN
INTRAVENOUS | Status: DC
Start: 2018-04-01 — End: 2018-04-01

## 2018-04-01 MED ORDER — OXYCODONE HCL 5 MG PO TABS: 5.0000 mg | ORAL_TABLET | ORAL | 0 refills | Status: DC | PRN

## 2018-04-01 MED ORDER — CEFAZOLIN SODIUM-DEXTROSE 2-4 GM/100ML-% IV SOLN
2.0000 g | INTRAVENOUS | Status: AC
  Administered 2018-04-01: 2 g via INTRAVENOUS
  Filled 2018-04-01: qty 100

## 2018-04-01 MED ORDER — SODIUM CHLORIDE 0.9 % IR SOLN
Status: DC | PRN
  Administered 2018-04-01 (×2): 3000 mL

## 2018-04-01 MED ORDER — BUPIVACAINE IN DEXTROSE 0.75-8.25 % IT SOLN
INTRATHECAL | Status: AC
  Filled 2018-04-01: qty 2

## 2018-04-01 MED ORDER — LACTATED RINGERS IV SOLN
INTRAVENOUS | Status: DC
  Administered 2018-04-01: 11:00:00 via INTRAVENOUS

## 2018-04-01 MED ORDER — ONDANSETRON HCL 4 MG/2ML IJ SOLN: 4.0000 mg | Freq: Once | INTRAMUSCULAR | Status: DC

## 2018-04-01 MED ORDER — SODIUM CHLORIDE 0.9% FLUSH
INTRAVENOUS | Status: AC
  Filled 2018-04-01: qty 10

## 2018-04-01 MED ORDER — IPRATROPIUM-ALBUTEROL 0.5-2.5 (3) MG/3ML IN SOLN
RESPIRATORY_TRACT | Status: AC
  Filled 2018-04-01: qty 3

## 2018-04-01 MED ORDER — PROPOFOL 500 MG/50ML IV EMUL
INTRAVENOUS | Status: DC | PRN
  Administered 2018-04-01: 25 ug/kg/min via INTRAVENOUS

## 2018-04-01 SURGICAL SUPPLY — 22 items
BAG DRAIN URO TABLE W/ADPT NS (DRAPE) ×2 IMPLANT
BAG URINE DRAINAGE (UROLOGICAL SUPPLIES) ×2 IMPLANT
CATH FOLEY 2WAY SLVR  5CC 20FR (CATHETERS) ×1
CATH FOLEY 2WAY SLVR 5CC 20FR (CATHETERS) ×1 IMPLANT
CLOTH BEACON ORANGE TIMEOUT ST (SAFETY) ×2 IMPLANT
ELECT REM PT RETURN 9FT ADLT (ELECTROSURGICAL) ×2
ELECTRODE REM PT RTRN 9FT ADLT (ELECTROSURGICAL) ×1 IMPLANT
GLOVE BIO SURGEON STRL SZ8 (GLOVE) ×2 IMPLANT
GLOVE BIOGEL PI IND STRL 7.0 (GLOVE) ×2 IMPLANT
GLOVE BIOGEL PI INDICATOR 7.0 (GLOVE) ×2
GOWN STRL REUS W/TWL LRG LVL3 (GOWN DISPOSABLE) ×2 IMPLANT
GOWN STRL REUS W/TWL XL LVL3 (GOWN DISPOSABLE) ×2 IMPLANT
IV NS IRRIG 3000ML ARTHROMATIC (IV SOLUTION) ×6 IMPLANT
KIT TURNOVER CYSTO (KITS) ×2 IMPLANT
LOOP MONOPOLAR YLW (ELECTROSURGICAL) ×2 IMPLANT
MANIFOLD NEPTUNE II (INSTRUMENTS) ×2 IMPLANT
PACK CYSTO (CUSTOM PROCEDURE TRAY) ×2 IMPLANT
PAD ARMBOARD 7.5X6 YLW CONV (MISCELLANEOUS) ×2 IMPLANT
SYR 10ML LL (SYRINGE) ×2 IMPLANT
SYRINGE IRR TOOMEY STRL 70CC (SYRINGE) ×2 IMPLANT
WATER STERILE IRR 1000ML POUR (IV SOLUTION) ×2 IMPLANT
WATER STERILE IRR 3000ML UROMA (IV SOLUTION) ×4 IMPLANT

## 2018-04-01 NOTE — Anesthesia Postprocedure Evaluation (Signed)
Anesthesia Post Note  Patient: Carl Murphy  Procedure(s) Performed: TRANSURETHRAL RESECTION OF BLADDER TUMOR (TURBT) (N/A )  Patient location during evaluation: PACU Anesthesia Type: Spinal Level of consciousness: awake and alert and oriented Vital Signs Assessment: post-procedure vital signs reviewed and stable Respiratory status: spontaneous breathing Cardiovascular status: stable Postop Assessment: no apparent nausea or vomiting and spinal receding Anesthetic complications: no     Last Vitals:  Vitals:   04/01/18 1230 04/01/18 1245  BP: (!) 185/72 (!) 193/91  Pulse: 65 68  Resp: 17 16  Temp:    SpO2:      Last Pain:  Vitals:   04/01/18 1245  TempSrc:   PainSc: 0-No pain                 Marsean Elkhatib

## 2018-04-01 NOTE — Anesthesia Procedure Notes (Signed)
Procedure Name: MAC Date/Time: 04/01/2018 10:50 AM Performed by: Vista Deck, CRNA Pre-anesthesia Checklist: Patient identified, Emergency Drugs available, Suction available, Timeout performed and Patient being monitored Patient Re-evaluated:Patient Re-evaluated prior to induction Oxygen Delivery Method: Nasal Cannula

## 2018-04-01 NOTE — Anesthesia Postprocedure Evaluation (Signed)
Anesthesia Post Note  Patient: Carl Murphy  Procedure(s) Performed: TRANSURETHRAL RESECTION OF BLADDER TUMOR (TURBT) (N/A )  Patient location during evaluation: PACU Anesthesia Type: Spinal Level of consciousness: awake and alert Pain management: pain level controlled Vital Signs Assessment: post-procedure vital signs reviewed and stable Respiratory status: spontaneous breathing, nonlabored ventilation, respiratory function stable and patient connected to nasal cannula oxygen Cardiovascular status: blood pressure returned to baseline and stable Postop Assessment: no apparent nausea or vomiting Anesthetic complications: no     Last Vitals:  Vitals:   04/01/18 0949  BP: (!) 207/97  Pulse: 78  Resp: 20  Temp: 36.4 C  SpO2: 97%    Last Pain:  Vitals:   04/01/18 0949  TempSrc: Oral  PainSc: 0-No pain                 Benay Pike

## 2018-04-01 NOTE — Interval H&P Note (Signed)
History and Physical Interval Note:  04/01/2018 10:37 AM  Carl Murphy  has presented today for surgery, with the diagnosis of BLADDER CANCER, HEMATURIA  The various methods of treatment have been discussed with the patient and family. After consideration of risks, benefits and other options for treatment, the patient has consented to  Procedure(s) with comments: TRANSURETHRAL RESECTION OF BLADDER TUMOR (TURBT) (N/A) - 1 HR [phone] BLUE [id number] as a surgical intervention .  The patient's history has been reviewed, patient examined, no change in status, stable for surgery.  I have reviewed the patient's chart and labs.  Questions were answered to the patient's satisfaction.     Lillette Boxer Monchel Pollitt

## 2018-04-01 NOTE — Transfer of Care (Signed)
Immediate Anesthesia Transfer of Care Note  Patient: Carl Murphy  Procedure(s) Performed: TRANSURETHRAL RESECTION OF BLADDER TUMOR (TURBT) (N/A )  Patient Location: PACU  Anesthesia Type:Spinal  Level of Consciousness: awake, alert  and patient cooperative  Airway & Oxygen Therapy: Patient Spontanous Breathing and Patient connected to nasal cannula oxygen  Post-op Assessment: Report given to RN and Post -op Vital signs reviewed and stable  Post vital signs: Reviewed and stable  Last Vitals:  Vitals Value Taken Time  BP    Temp    Pulse    Resp    SpO2      Last Pain:  Vitals:   04/01/18 0949  TempSrc: Oral  PainSc: 0-No pain      Patients Stated Pain Goal: 5 (31/49/70 2637)  Complications: No apparent anesthesia complications

## 2018-04-01 NOTE — Discharge Instructions (Signed)
Remove catheter on Friday morning  Expect bloody drainage for a few days  Followup w/ oncologist next week for hemoglobin check.    Message left at [facility] of patient need of follow up for hemoglobin check next week        Transurethral Resection of Bladder Tumor, Care After Refer to this sheet in the next few weeks. These instructions provide you with information about caring for yourself after your procedure. Your health care provider may also give you more specific instructions. Your treatment has been planned according to current medical practices, but problems sometimes occur. Call your health care provider if you have any problems or questions after your procedure. What can I expect after the procedure? After the procedure, it is common to have:  A small amount of blood in your urine for up to 2 weeks.  Soreness or mild discomfort from your catheter. After your catheter is removed, you may have mild soreness, especially when urinating.  Pain in your lower abdomen.  Follow these instructions at home:  Medicines  Take over-the-counter and prescription medicines only as told by your health care provider.  Do not drive or operate heavy machinery while taking prescription pain medicine.  Do not drive for 24 hours if you received a sedative.  If you were prescribed antibiotic medicine, take it as told by your health care provider. Do not stop taking the antibiotic even if you start to feel better. Activity  Return to your normal activities as told by your health care provider. Ask your health care provider what activities are safe for you.  Do not lift anything that is heavier than 10 lb (4.5 kg) for as long as told by your health care provider.  Avoid intense physical activity for as long as told by your health care provider.  Walk at least one time every day. This helps to prevent blood clots. You may increase your physical activity gradually as you start  to feel better. General instructions  Do not drink alcohol for as long as told by your health care provider. This is especially important if you are taking prescription pain medicines.  Do not take baths, swim, or use a hot tub until your health care provider approves.  If you have a catheter, follow instructions from your health care provider about caring for your catheter and your drainage bag.  Drink enough fluid to keep your urine clear or pale yellow.  Wear compression stockings as told by your health care provider. These stockings help to prevent blood clots and reduce swelling in your legs.  Keep all follow-up visits as told by your health care provider. This is important. Contact a health care provider if:  You have pain that gets worse or does not improve with medicine.  You have blood in your urine for more than 2 weeks.  You have cloudy or bad-smelling urine.  You become constipated. Signs of constipation may include having: ? Fewer than three bowel movements in a week. ? Difficulty having a bowel movement. ? Stools that are dry, hard, or larger than normal.  You have a fever. Get help right away if:  You have: ? Severe pain. ? Bright-red blood in your urine. ? Blood clots in your urine. ? A lot of blood in your urine.  Your catheter has been removed and you are not able to urinate.  You have a catheter in place and the catheter is not draining urine. This information is not  intended to replace advice given to you by your health care provider. Make sure you discuss any questions you have with your health care provider. Document Released: 10/29/2015 Document Revised: 07/20/2016 Document Reviewed: 08/09/2015 Elsevier Interactive Patient Education  2018 [employer].

## 2018-04-01 NOTE — Anesthesia Preprocedure Evaluation (Addendum)
Anesthesia Evaluation  Patient identified by MRN, date of birth, ID band Patient awake    Reviewed: Allergy & Precautions, H&P , NPO status , Patient's Chart, lab work & pertinent test results, reviewed documented beta blocker date and time   Airway Mallampati: IV  TM Distance: >3 FB Neck ROM: full    Dental no notable dental hx.    Pulmonary neg pulmonary ROS, Current Smoker,    Pulmonary exam normal breath sounds clear to auscultation       Cardiovascular Exercise Tolerance: Good hypertension, negative cardio ROS Normal cardiovascular exam Rhythm:regular Rate:Normal     Neuro/Psych negative neurological ROS  negative psych ROS   GI/Hepatic negative GI ROS, Neg liver ROS,   Endo/Other  negative endocrine ROS  Renal/GU negative Renal ROS  negative genitourinary   Musculoskeletal   Abdominal   Peds  Hematology negative hematology ROS (+)   Anesthesia Other Findings No recent changes in health history No clinical complaints EKG... NSR with no acute changes  Reproductive/Obstetrics negative OB ROS                            Anesthesia Physical Anesthesia Plan  ASA: III  Anesthesia Plan: Spinal   Post-op Pain Management:    Induction:   PONV Risk Score and Plan:   Airway Management Planned:   Additional Equipment:   Intra-op Plan:   Post-operative Plan:   Informed Consent: I have reviewed the patients History and Physical, chart, labs and discussed the procedure including the risks, benefits and alternatives for the proposed anesthesia with the patient or authorized representative who has indicated his/her understanding and acceptance.   Dental Advisory Given  Plan Discussed with: CRNA  Anesthesia Plan Comments:        Anesthesia Quick Evaluation

## 2018-04-01 NOTE — Anesthesia Procedure Notes (Addendum)
Spinal  Patient location during procedure: OR Start time: 04/01/2018 10:55 AM End time: 04/01/2018 10:59 AM Staffing Anesthesiologist: Benay Pike, MD Preanesthetic Checklist Completed: patient identified, site marked, surgical consent, pre-op evaluation, timeout performed, IV checked, risks and benefits discussed and monitors and equipment checked Spinal Block Patient position: sitting Prep: Betadine Patient monitoring: heart rate, cardiac monitor, continuous pulse ox and blood pressure Approach: midline Location: L3-4 Injection technique: single-shot Needle Needle type: Spinocan  Needle gauge: 22 G Needle length: 9 cm Needle insertion depth: 4 cm Assessment Sensory level: T10 Additional Notes Tray GTIN: [id number] Lot#: [id number] Expire: 2019-05-31

## 2018-04-01 NOTE — H&P (Signed)
H&P  Chief Complaint: Blood in urine  History of Present Illness: 72 year old male presents today for palliative TUR bladder tumor.  The patient has recurrent high-grade muscle invasive bladder cancer.  He is status post prior resections, chemo/radiation and now is on salvage chemotherapy.  The patient has had persistent gross hematuria and cystoscopic evidence of recurrence of his bladder cancer.  Because of his persistent hematuria, he presents for TURP for palliative/hemostatic purposes.  He has had recent bilateral percutaneous nephrostomy tube placement for bilateral hydronephrosis.  Past Medical History:  Diagnosis Date  . Arthritis   . Cancer [facility])    Bladder Cancer  . CHF (congestive heart failure) ([place])   . COPD (chronic obstructive pulmonary disease) ([place])   . Depression   . Hard of hearing    bilat   . Hypertension   . Malignant neoplasm of trigone of bladder ([place]) 03/25/2016  . PONV (postoperative nausea and vomiting)   . Shortness of breath dyspnea    heat; walking up set of stairs; pt states can walk up 12-14 stairs slowly without having to stop to catch his breath    Past Surgical History:  Procedure Laterality Date  . CYSTOSCOPY N/A 06/16/2016   Procedure: CYSTOSCOPY;  Surgeon: Franchot Gallo, MD;  Location: [facility];  Service: Urology;  Laterality: N/A;  . IR FLUORO GUIDE PORT INSERTION RIGHT  06/18/2017  . IR NEPHROSTOMY PLACEMENT LEFT  03/25/2018  . IR NEPHROSTOMY PLACEMENT RIGHT  03/25/2018  . IR US GUIDE VASC ACCESS RIGHT  06/18/2017  . TRANSURETHRAL RESECTION OF BLADDER TUMOR N/A 03/25/2016   Procedure: TRANSURETHRAL RESECTION OF BLADDER TUMOR (TURBT);  Surgeon: Franchot Gallo, MD;  Location: [facility];  Service: Urology;  Laterality: N/A;  . TRANSURETHRAL RESECTION OF BLADDER TUMOR N/A 06/16/2016   Procedure: TRANSURETHRAL RESECTION OF BLADDER TUMOR (TURBT);  Surgeon: Franchot Gallo, MD;  Location: [facility];  Service: Urology;  Laterality: N/A;  . TRANSURETHRAL  RESECTION OF BLADDER TUMOR N/A 12/16/2016   Procedure: TRANSURETHRAL RESECTION OF BLADDER TUMOR (TURBT);  Surgeon: Franchot Gallo, MD;  Location: [facility];  Service: Urology;  Laterality: N/A;  . TRANSURETHRAL RESECTION OF BLADDER TUMOR N/A 05/11/2017   Procedure: TRANSURETHRAL RESECTION OF BLADDER TUMOR (TURBT) CLOT EVACUATION;  Surgeon: Franchot Gallo, MD;  Location: [facility];  Service: Urology;  Laterality: N/A;  1 HR [phone] BLUE [id number]    Home Medications:  Allergies as of 04/01/2018      Reactions   Carboplatin Shortness Of Breath   Reaction occurred after patient left Cancer Center. Pt reported extreme sob after leaving [facility] that eventually resolved on its own.      Medication List    Notice   Cannot display discharge medications because the patient has not yet been admitted.     Allergies:  Allergies  Allergen Reactions  . Carboplatin Shortness Of Breath    Reaction occurred after patient left Cancer Center. Pt reported extreme sob after leaving [facility] that eventually resolved on its own.    Family History  Problem Relation Age of Onset  . Hypertension Mother   . Asthma Father   . Emphysema Father     Social History:  reports that he has been smoking cigarettes.  He has a 100.00 pack-year smoking history. He has never used smokeless tobacco. He reports that he does not drink alcohol or use drugs.  ROS: A complete review of systems was performed.  All systems are negative except for pertinent findings as noted.  Physical Exam:  Vital  signs in last 24 hours:   Constitutional:  Alert and oriented, No acute distress Cardiovascular: Regular rate Respiratory: Use of accessory muscles present GI: Abdomen is soft, nontender, nondistended, no abdominal masses Genitourinary: No CVAT. Normal male phallus, testes are descended bilaterally and non-tender and without masses, scrotum is normal in appearance without lesions or masses, perineum is normal on  inspection. Lymphatic: No lymphadenopathy Neurologic: Grossly intact, no focal deficits Psychiatric: Normal mood and affect  Laboratory Data:  Recent Labs    03/30/18 0950  WBC 5.0  HGB 8.9*  HCT 28.6*  PLT 248    Recent Labs    03/30/18 0950  NA 139  K 3.6  CL 103  GLUCOSE 107*  BUN 45*  CALCIUM 8.4*  CREATININE 3.63*     Results for orders placed or performed in visit on 03/09/18 (from the past 24 hour(s))  BLOOD TRANSFUSION REPORT - SCANNED     Status: None   Collection Time: 03/31/18 11:04 AM   Narrative   Ordered by an unspecified provider.   No results found for this or any previous visit (from the past 240 hour(s)).  Renal Function: Recent Labs    03/25/18 0745 03/30/18 0950  CREATININE 3.49* 3.63*   CrCl cannot be calculated (Unknown ideal weight.).  Radiologic Imaging: No results found.  Impression/Assessment:  Progressive muscle invasive bladder cancer with recurrent hematuria/stranguria  Plan:  Palliative TURBT

## 2018-04-02 ENCOUNTER — Encounter (HOSPITAL_COMMUNITY): Payer: Self-pay | Admitting: Urology

## 2018-04-05 ENCOUNTER — Ambulatory Visit (HOSPITAL_COMMUNITY): Payer: Medicare Other

## 2018-04-06 ENCOUNTER — Other Ambulatory Visit (HOSPITAL_COMMUNITY): Payer: Self-pay | Admitting: *Deleted

## 2018-04-06 DIAGNOSIS — C679 Malignant neoplasm of bladder, unspecified: Secondary | ICD-10-CM

## 2018-04-07 ENCOUNTER — Inpatient Hospital Stay (HOSPITAL_COMMUNITY): Payer: Medicare Other | Attending: Oncology

## 2018-04-07 DIAGNOSIS — C679 Malignant neoplasm of bladder, unspecified: Secondary | ICD-10-CM

## 2018-04-07 DIAGNOSIS — I700 Atherosclerosis of aorta: Secondary | ICD-10-CM | POA: Insufficient documentation

## 2018-04-07 DIAGNOSIS — R439 Unspecified disturbances of smell and taste: Secondary | ICD-10-CM | POA: Diagnosis not present

## 2018-04-07 DIAGNOSIS — R630 Anorexia: Secondary | ICD-10-CM | POA: Insufficient documentation

## 2018-04-07 DIAGNOSIS — Z936 Other artificial openings of urinary tract status: Secondary | ICD-10-CM | POA: Insufficient documentation

## 2018-04-07 DIAGNOSIS — D649 Anemia, unspecified: Secondary | ICD-10-CM | POA: Insufficient documentation

## 2018-04-07 DIAGNOSIS — Z9181 History of falling: Secondary | ICD-10-CM | POA: Diagnosis not present

## 2018-04-07 DIAGNOSIS — R634 Abnormal weight loss: Secondary | ICD-10-CM | POA: Insufficient documentation

## 2018-04-07 DIAGNOSIS — R05 Cough: Secondary | ICD-10-CM | POA: Diagnosis not present

## 2018-04-07 DIAGNOSIS — R0602 Shortness of breath: Secondary | ICD-10-CM | POA: Insufficient documentation

## 2018-04-07 DIAGNOSIS — Z5112 Encounter for antineoplastic immunotherapy: Secondary | ICD-10-CM | POA: Insufficient documentation

## 2018-04-07 DIAGNOSIS — I2510 Atherosclerotic heart disease of native coronary artery without angina pectoris: Secondary | ICD-10-CM | POA: Insufficient documentation

## 2018-04-07 DIAGNOSIS — Z720 Tobacco use: Secondary | ICD-10-CM | POA: Diagnosis not present

## 2018-04-07 DIAGNOSIS — C670 Malignant neoplasm of trigone of bladder: Secondary | ICD-10-CM | POA: Insufficient documentation

## 2018-04-07 DIAGNOSIS — Z79899 Other long term (current) drug therapy: Secondary | ICD-10-CM | POA: Insufficient documentation

## 2018-04-07 DIAGNOSIS — C7800 Secondary malignant neoplasm of unspecified lung: Secondary | ICD-10-CM | POA: Diagnosis present

## 2018-04-07 DIAGNOSIS — N189 Chronic kidney disease, unspecified: Secondary | ICD-10-CM | POA: Diagnosis not present

## 2018-04-07 LAB — CBC WITH DIFFERENTIAL/PLATELET
BASOS ABS: 0 10*3/uL (ref 0.0–0.1)
Basophils Relative: 0 %
EOS ABS: 0 10*3/uL (ref 0.0–0.7)
Eosinophils Relative: 0 %
HCT: 27.2 % — ABNORMAL LOW (ref 39.0–52.0)
Hemoglobin: 8.6 g/dL — ABNORMAL LOW (ref 13.0–17.0)
LYMPHS ABS: 1.3 10*3/uL (ref 0.7–4.0)
LYMPHS PCT: 27 %
MCH: 31.4 pg (ref 26.0–34.0)
MCHC: 31.6 g/dL (ref 30.0–36.0)
MCV: 99.3 fL (ref 78.0–100.0)
Monocytes Absolute: 0.6 10*3/uL (ref 0.1–1.0)
Monocytes Relative: 12 %
Neutro Abs: 3 10*3/uL (ref 1.7–7.7)
Neutrophils Relative %: 61 %
PLATELETS: 246 10*3/uL (ref 150–400)
RBC: 2.74 MIL/uL — AB (ref 4.22–5.81)
RDW: 15.3 % (ref 11.5–15.5)
WBC: 4.8 10*3/uL (ref 4.0–10.5)

## 2018-04-07 LAB — COMPREHENSIVE METABOLIC PANEL
ALK PHOS: 63 U/L (ref 38–126)
ALT: 7 U/L — AB (ref 17–63)
AST: 17 U/L (ref 15–41)
Albumin: 2.8 g/dL — ABNORMAL LOW (ref 3.5–5.0)
Anion gap: 8 (ref 5–15)
BUN: 44 mg/dL — AB (ref 6–20)
CALCIUM: 8.2 mg/dL — AB (ref 8.9–10.3)
CHLORIDE: 106 mmol/L (ref 101–111)
CO2: 24 mmol/L (ref 22–32)
CREATININE: 3.33 mg/dL — AB (ref 0.61–1.24)
GFR calc non Af Amer: 17 mL/min — ABNORMAL LOW (ref >60)
GFR, EST AFRICAN AMERICAN: 20 mL/min — AB (ref >60)
Glucose, Bld: 102 mg/dL — ABNORMAL HIGH (ref 65–99)
Potassium: 4.1 mmol/L (ref 3.5–5.1)
SODIUM: 138 mmol/L (ref 135–145)
Total Bilirubin: 0.6 mg/dL (ref 0.3–1.2)
Total Protein: 6.6 g/dL (ref 6.5–8.1)

## 2018-04-07 LAB — SAMPLE TO BLOOD BANK

## 2018-04-07 LAB — LACTATE DEHYDROGENASE: LDH: 159 U/L (ref 98–192)

## 2018-04-07 NOTE — Op Note (Signed)
Preoperative diagnosis: High-grade, muscle invasive, progressive/metastatic bladder cancer  Postoperative diagnosis: Same  Principal procedure: TURBT, greater than 5 cm bladder tumor  Surgeon: Malessa Zartman  Anesthesia: Subarachnoid block  Drains: 20 French Foley catheter  Specimen: Bladder tumor fragments  Complications: None  Estimated blood loss: Less than 50 mL  Indications: 72 year old male with progressive, metastatic bladder cancer with bilateral hydronephrosis, status post recent nephrostomy tube placements bilaterally.  He has had persistent, significant gross hematuria with bladder pain.  He presents at this time for palliative resection and 3 of his bladder tumor.  Description of procedure: The patient was properly identified in the holding area.  He received preoperative IV antibiotics.  He was taken to the operating room where subarachnoid block was administered.  He was placed in the dorsolithotomy position.  Genitalia and perineum were prepped and draped.  Proper timeout was performed.  9 French resectoscope sheath was placed using the visual obturator.  Cursory inspection of the bladder was performed.  This revealed greater than 50% of the bladder wall involved with both nodular and papillary bladder cancer.  The bladder was quite contracted.  Using the cutting loop, I resected the visible bladder tumor down flat, approximating the normal urothelial level.  The tumor was greater than 5 cm in diameter.  There was not significant bleeding during the resection, although all resected areas were carefully cauterized.  Following completion of the resection, and establishing hemostasis, the fragments were irrigated from the bladder and sent for pathology labeled "bladder tumor".  There being adequate hemostasis and no further needle resection, the scope was removed.  I placed a 20 French Foley catheter and hooked this to dependent drainage.  The patient was then taken to the PACU in  stable condition, having tolerated the procedure well.

## 2018-04-08 ENCOUNTER — Telehealth (HOSPITAL_COMMUNITY): Payer: Self-pay

## 2018-04-08 NOTE — Telephone Encounter (Signed)
Called patient to let him know that it was ok to cut off the blood band bracelet. No blood given at this time per md. Informed him to call us or go to the emergency room if his symptoms get worse.

## 2018-04-16 ENCOUNTER — Other Ambulatory Visit (HOSPITAL_COMMUNITY): Payer: Self-pay

## 2018-04-16 DIAGNOSIS — C670 Malignant neoplasm of trigone of bladder: Secondary | ICD-10-CM

## 2018-04-16 NOTE — Progress Notes (Signed)
[place], [place] [address]   CLINIC:  Medical Oncology/Hematology  PCP:  [name], [place] [address] [phone]   REASON FOR VISIT:  Follow-up for Stage IV bladder cancer   CURRENT THERAPY: Keytruda every 3 weeks  BRIEF ONCOLOGIC HISTORY:  Oncology History   Per available records had bladder tumor removed but had extension into the muscularis propria. CT showed no extension beyond the bladder. Has seen 2 urologists in consultation. Adamant about not having a cystectomy and ileal conduit.     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 No findings for local spread of disease or metastatic disease      03/25/2016 Surgery    TURBT 3cm right trigonal bladder tumor. followed by intravesical epirubicin instillation      03/25/2016 Pathology Results    bladder TURBT infiltrative high grade papillary urothelial carcinoma, invades the mulcularis propria (detrusor) no LVI       04/14/2016 Miscellaneous    [name] consultation in [place] for consideration of cystectomy. patient also offered repeat resection with BCG, and CHEMO/XRT      06/17/2016 Pathology Results    TURB- high grade papillary urothelial carcinoma with focal stromal invasion       Radiation Therapy    Planned to be completed on 09/10/2016.      07/22/2016 -  Chemotherapy    The patient had palonosetron (ALOXI) injection 0.25 mg, 0.25 mg, Intravenous,  Once, 2 of 5 cycles Administration: 0.25 mg (07/28/2016)  CARBOplatin (PARAPLATIN) 160 mg in sodium chloride 0.9 % 100 mL chemo infusion, 160 mg (100 % of original dose 160.6 mg), Intravenous,  Once, 2 of 5 cycles Dose modification:   (original dose 160.6 mg, Cycle 1),   (original dose 160.6 mg, Cycle 1),   (original dose 160.6 mg, Cycle 1),   (original dose 160.6 mg, Cycle  2) Administration: 180 mg (07/28/2016)  for chemotherapy treatment.        04/07/2017 Relapse/Recurrence    CT Abd/pelvis: IMPRESSION: 1. Recurrent bladder carcinoma with synchronous disease in the distal right ureter causing severe right hydronephrosis and decreased right renal function. 2. Bilateral renal stones. 3. Aortic atherosclerosis (ICD10-170.0). Coronary artery calcification. 4. Small periumbilical hernia contains a knuckle of unobstructed small bowel.       06/10/2017 PET scan    1. Metachronous urothelial carcinoma within the distal right ureter, as on prior CT. 2. No evidence of nodal metastasis. 3. Bilateral hypermetabolic pulmonary nodules, favoring pulmonary metastasis. Especially given emphysema, 1 or more primary bronchogenic carcinomas cannot be excluded but are felt less likely. 4. Coronary artery atherosclerosis. Aortic Atherosclerosis (ICD10-I70.0). 5. Bladder not well evaluated secondary to hypermetabolic urine within.       06/18/2017 Procedure    Placement of single lumen port a cath via right internal jugular vein. The catheter tip lies at the cavo-atrial junction. A power injectable port a cath was placed and is ready for immediate use.      06/29/2017 -  Chemotherapy    Carboplatin/Gemcitabine every 21 days       08/14/2017 Imaging    CT C/A/P IMPRESSION: 1. Overall stable pulmonary nodules. Knee soft tissue rim around the cavitary lesion in the left upper lobe is slightly thinner. No new pulmonary nodules or progressive findings in the chest. 2. Stable advanced emphysematous changes  and pulmonary scarring. 3. No mediastinal or hilar mass or lymphadenopathy. 4. Improved CT appearance of the bladder. The asymmetric areas of bladder wall enhancement have improved. There is still diffuse irregular bladder wall thickening and distal right ureteral tumor appears stable. Stable right-sided hydroureteronephrosis. 5. Stable cirrhotic changes  involving the liver and stable liver cysts. 6. Stable scattered mesenteric and retroperitoneal and pelvic lymph nodes but no mass or overt adenopathy. 7. Stable severe/advanced atherosclerotic calcifications involving the thoracic and abdominal aorta and branch vessels.      11/18/2017 Imaging    PET CT: IMPRESSION: 1. Significant partial metabolic response. Bilateral mildly hypermetabolic pulmonary metastases are decreased in size and metabolism. No new or progressive hypermetabolic metastatic disease. 2. Stable hypermetabolic distal right ureteral tumor with marked right hydroureteronephrosis. 3. Chronic findings include: Aortic Atherosclerosis (ICD10-I70.0) and Emphysema (ICD10-J43.9). Three-vessel coronary atherosclerosis. Stable small fat containing umbilical hernia.      03/02/2018 Imaging    Whole body PET CT scan  IMPRESSION: 1. Progressive pulmonary metastatic disease with enlarging and progressively hypermetabolic pulmonary nodules as described above. 2. New nodularity at the left lung base is hypermetabolic but could be an inflammatory or infectious process. 3. Stable hypermetabolic tumor in the right distal ureter. 4. No evidence of metastatic disease involving the abdomen/pelvis or osseous structures. 5. Chronic bilateral hydroureteronephrosis and asymmetric bladder wall thickening. 6. Stable severe atherosclerotic disease      03/08/2018 -  Chemotherapy    The patient had pembrolizumab (KEYTRUDA) 200 mg in sodium chloride 0.9 % 50 mL chemo infusion, 200 mg, Intravenous, Once, 2 of 4 cycles Administration: 200 mg (03/09/2018), 200 mg (03/30/2018)  for chemotherapy treatment.           INTERVAL HISTORY:  Mr. Bulson 72 y.o. male returns for routine follow-up and consideration for next cycle of chemotherapy.   Due for cycle #3 of Keytruda today.   Since his last treatment, he underwent TURBT with Dr. Diona Fanti on 04/01/18. Surgical path revealed invasive  high-grade papillary urothelial carcinoma, with invasion into lamina propria at level below muscularis mucosa.   The nurses and his friend tell me that his breathing appears worse.  Mr. Elison seems to downplay his symptoms. States, "I do have a hard time breathing in the mornings, but it gets better as the day goes on."  His friend, who drove him to treatment today, states, "His breathing was so bad this morning when I picked him up that I wasn't sure he was going to make it to the car."  He continues to have cough as well, which is chronic. Denies hemoptysis.  He does occasional produce white phlegm with his cough.  Denies any fever/chills.  Reports that his feet have been swelling, off and on.   States that his PCP has managed his heart failure in the past with Lasix, "but I took all of those a month or two ago."    Reports that he has fallen x 3 since his last treatment.  First incident, he tripped. Second incident, he was seated and "when I went to stand up my right leg was asleep so I fell."  And the third incident, he states that he lost his balance. Denies any syncopal episodes. Denies any headaches or dizziness.  Denies hitting his head with any of these falls.  He did have a nosebleed with 1 fall, but it resolved on its own.  Ambulates with both walker and cane at home.   Denies any rash, diarrhea, constipation, nausea,  or vomiting.  Appetite 25%; energy levels 25%.  Reports drinking Ensure about once per week.  Weight stable in the past 3 weeks (lost ~1 lb).     REVIEW OF SYSTEMS:  Review of Systems - Oncology Per HPI. Otherwise 12 point ROS completed and negative except as stated above.   PAST MEDICAL/SURGICAL HISTORY:  Past Medical History:  Diagnosis Date  . Arthritis   . Cancer [facility])    Bladder Cancer  . CHF (congestive heart failure) ([place])   . COPD (chronic obstructive pulmonary disease) ([place])   . Depression   . Hard of hearing    bilat   . Hypertension   . Malignant  neoplasm of trigone of bladder ([place]) 03/25/2016  . PONV (postoperative nausea and vomiting)   . Shortness of breath dyspnea    heat; walking up set of stairs; pt states can walk up 12-14 stairs slowly without having to stop to catch his breath   Past Surgical History:  Procedure Laterality Date  . CYSTOSCOPY N/A 06/16/2016   Procedure: CYSTOSCOPY;  Surgeon: Franchot Gallo, MD;  Location: [facility];  Service: Urology;  Laterality: N/A;  . IR FLUORO GUIDE PORT INSERTION RIGHT  06/18/2017  . IR NEPHROSTOMY PLACEMENT LEFT  03/25/2018  . IR NEPHROSTOMY PLACEMENT RIGHT  03/25/2018  . IR US GUIDE VASC ACCESS RIGHT  06/18/2017  . TRANSURETHRAL RESECTION OF BLADDER TUMOR N/A 03/25/2016   Procedure: TRANSURETHRAL RESECTION OF BLADDER TUMOR (TURBT);  Surgeon: Franchot Gallo, MD;  Location: [facility];  Service: Urology;  Laterality: N/A;  . TRANSURETHRAL RESECTION OF BLADDER TUMOR N/A 06/16/2016   Procedure: TRANSURETHRAL RESECTION OF BLADDER TUMOR (TURBT);  Surgeon: Franchot Gallo, MD;  Location: [facility];  Service: Urology;  Laterality: N/A;  . TRANSURETHRAL RESECTION OF BLADDER TUMOR N/A 12/16/2016   Procedure: TRANSURETHRAL RESECTION OF BLADDER TUMOR (TURBT);  Surgeon: Franchot Gallo, MD;  Location: [facility];  Service: Urology;  Laterality: N/A;  . TRANSURETHRAL RESECTION OF BLADDER TUMOR N/A 05/11/2017   Procedure: TRANSURETHRAL RESECTION OF BLADDER TUMOR (TURBT) CLOT EVACUATION;  Surgeon: Franchot Gallo, MD;  Location: [facility];  Service: Urology;  Laterality: N/A;  [place] BLUE MEDICARE-[id number]  . TRANSURETHRAL RESECTION OF BLADDER TUMOR N/A 04/01/2018   Procedure: TRANSURETHRAL RESECTION OF BLADDER TUMOR (TURBT);  Surgeon: Franchot Gallo, MD;  Location: [facility];  Service: Urology;  Laterality: N/A;  1 HR [id number] BLUE MEDICARE-[id number]     SOCIAL HISTORY:  Social History   Socioeconomic History  . Marital status: Married    Spouse name: Not on file  . Number of  children: Not on file  . Years of education: Not on file  . Highest education level: Not on file  Occupational History  . Not on file  Social Needs  . Financial resource strain: Not on file  . Food insecurity:    Worry: Not on file    Inability: Not on file  . Transportation needs:    Medical: Not on file    Non-medical: Not on file  Tobacco Use  . Smoking status: Current Every Day Smoker    Packs/day: 2.00    Years: 50.00    Pack years: 100.00    Types: Cigarettes  . Smokeless tobacco: Never Used  Substance and Sexual Activity  . Alcohol use: No    Comment: occasionally  . Drug use: No  . Sexual activity: Never    Birth control/protection: None  Lifestyle  . Physical activity:    Days per week: Not on  file    Minutes per session: Not on file  . Stress: Not on file  Relationships  . Social connections:    Talks on phone: Not on file    Gets together: Not on file    Attends religious service: Not on file    Active member of club or organization: Not on file    Attends meetings of clubs or organizations: Not on file    Relationship status: Not on file  . Intimate partner violence:    Fear of current or ex partner: Not on file    Emotionally abused: Not on file    Physically abused: Not on file    Forced sexual activity: Not on file  Other Topics Concern  . Not on file  Social History Narrative  . Not on file    FAMILY HISTORY:  Family History  Problem Relation Age of Onset  . Hypertension Mother   . Asthma Father   . Emphysema Father     CURRENT MEDICATIONS:  Outpatient Encounter Medications as of 04/20/2018  Medication Sig  . acetaminophen (TYLENOL) 500 MG tablet Take 500 mg by mouth as needed (PRN for pain).  Marland Kitchen amLODipine (NORVASC) 5 MG tablet Take 5 mg by mouth daily.   Huey Bienenstock (TECENTRIQ IV) Inject into the vein. Every 21 days  . carvedilol (COREG) 25 MG tablet Take 25 mg by mouth 2 (two) times daily.   . cetirizine (ZYRTEC) 10 MG tablet Take  10 mg by mouth daily.  . citalopram (CELEXA) 20 MG tablet Take 20 mg by mouth at bedtime.   . Diphenhyd-Hydrocort-Nystatin (FIRST-DUKES MOUTHWASH) SUSP Use as directed 5 mLs in the mouth or throat 4 (four) times daily -  before meals and at bedtime. (Patient not taking: Reported on 03/08/2018)  . fluticasone (FLONASE) 50 MCG/ACT nasal spray Place 2 sprays into both nostrils daily as needed for allergies or rhinitis.  Marland Kitchen lidocaine (XYLOCAINE) 2 % solution Swish and swallow 64ml three to four times a day as needed for throat pain. (Patient not taking: Reported on 03/08/2018)  . ondansetron (ZOFRAN-ODT) 4 MG disintegrating tablet Take 1 tablet (4 mg total) by mouth every 8 (eight) hours as needed for nausea or vomiting. (Patient not taking: Reported on 04/20/2018)  . oxybutynin (DITROPAN) 5 MG tablet Take 1 tablet (5 mg total) by mouth every 6 (six) hours as needed for bladder spasms. (Patient not taking: Reported on 04/20/2018)  . oxyCODONE (ROXICODONE) 5 MG immediate release tablet Take 1 tablet (5 mg total) by mouth every 4 (four) hours as needed for severe pain. (Patient not taking: Reported on 04/20/2018)  . prochlorperazine (COMPAZINE) 10 MG tablet Take 1 tablet (10 mg total) by mouth every 6 (six) hours as needed for nausea or vomiting. (Patient not taking: Reported on 04/20/2018)   No facility-administered encounter medications on file as of 04/20/2018.     ALLERGIES:  Allergies  Allergen Reactions  . Carboplatin Shortness Of Breath    Reaction occurred after patient left Cancer Center. Pt reported extreme sob after leaving CC that eventually resolved on its own.     PHYSICAL EXAM:  ECOG Performance status: 1-2 - Symptomatic; may require occasional assistance  BP 135/60 Pulse 67 Resp 20 Temp 97.8 O2 sat 96%  Weight 161 lbs   Physical Exam  Constitutional: He is oriented to person, place, and time.  Chronically-ill appearing male in NAD -Seen in chemo chair in infusion area   HENT:    Head: Normocephalic.  Mouth/Throat:  Oropharynx is clear and moist.  Eyes: Conjunctivae are normal. No scleral icterus.  Neck: Normal range of motion. Neck supple.  Cardiovascular: Normal rate and regular rhythm.  Pulmonary/Chest: Effort normal. He has wheezes.  Effort normal at rest -Pleural rubs noted throughout with both inspiratory and expiratory wheezes appreciated.  -No respiratory distress noted at rest   Abdominal: Soft. Bowel sounds are normal. There is no tenderness.  Musculoskeletal: Normal range of motion. He exhibits no edema.  Lymphadenopathy:    He has no cervical adenopathy.  Neurological: He is alert and oriented to person, place, and time. No cranial nerve deficit.  Skin: Skin is warm and dry.  Psychiatric: He has a normal mood and affect. Judgment normal.  Nursing note and vitals reviewed.    LABORATORY DATA:  I have reviewed the labs as listed.  CBC    Component Value Date/Time   WBC 5.6 04/20/2018 0858   RBC 2.86 (L) 04/20/2018 0858   HGB 8.8 (L) 04/20/2018 0858   HCT 28.1 (L) 04/20/2018 0858   PLT 270 04/20/2018 0858   MCV 98.3 04/20/2018 0858   MCH 30.8 04/20/2018 0858   MCHC 31.3 04/20/2018 0858   RDW 14.2 04/20/2018 0858   LYMPHSABS 0.7 04/20/2018 0858   MONOABS 0.6 04/20/2018 0858   EOSABS 0.0 04/20/2018 0858   BASOSABS 0.0 04/20/2018 0858   CMP Latest Ref Rng & Units 04/20/2018 04/07/2018 03/30/2018  Glucose 65 - 99 mg/dL 115(H) 102(H) 107(H)  BUN 6 - 20 mg/dL 35(H) 44(H) 45(H)  Creatinine 0.61 - 1.24 mg/dL 2.90(H) 3.33(H) 3.63(H)  Sodium 135 - 145 mmol/L 136 138 139  Potassium 3.5 - 5.1 mmol/L 4.6 4.1 3.6  Chloride 101 - 111 mmol/L 105 106 103  CO2 22 - 32 mmol/L 25 24 24   Calcium 8.9 - 10.3 mg/dL 8.3(L) 8.2(L) 8.4(L)  Total Protein 6.5 - 8.1 g/dL 6.6 6.6 6.8  Total Bilirubin 0.3 - 1.2 mg/dL 0.3 0.6 0.5  Alkaline Phos 38 - 126 U/L 57 63 61  AST 15 - 41 U/L 14(L) 17 18  ALT 17 - 63 U/L 8(L) 7(L) 7(L)    PENDING LABS:    DIAGNOSTIC  IMAGING:    PATHOLOGY:  Recent TURBT: 04/01/18       ASSESSMENT & PLAN:   Stage IV bladder cancer: -Last PET scan was done on 03/01/18, which showed progressive pulmonary metastatic disease with enlarging and progressively hypermetabolic pulmonary nodules.  New nodularity at the left lung base is hypermetabolic, but could be an inflammatory or infectious process per radiologist.  Stable hypermetabolic tumor in the right distal ureter.  No evidence of metastatic disease involving the abdomen, pelvis or osseous structures.  Chronic bilateral hydroureteronephrosis and asymmetric bladder wall thickening.  Based on these findings, treatment was changed to [facility] on 03/09/18.  -Here for cycle #3 Keytruda today. While his labs satisfy treatment parameters, given his progressive shortness of breath and continued cough, I will hold treatment and obtain STAT CT chest to evaluate for immune-mediated pneumonitis.  I explained to the patient that it is often difficult to discern worsening emphysema/COPD, heart failure, and pneumonitis on physical exam.  Imaging helps [place] determine the likely cause of his progressive shortness of breath.  While I have low clinical suspicion for Keytruda-mediated lung changes, will hold treatment today nonetheless to obtain imaging.  Based on CT results, can give steroids if pneumonitis is suspected on imaging.   -Will bring Mr. Plancarte back in 1 week for follow-up with Dr. [name]  and re-challenge current cycle of Keytruda vs other treatment option based on CT findings. Patient agrees with this plan.      Addendum:                   *CT chest had to be done without contrast given patient's kidney function.  CT chest results reviewed after patient left clinic today and impression is as follows: The dominant cavitary nodule within the left upper lobe has increased in size in the interval (now 1.8 x 2.3 cm, previously 1.6 x 1.7 cm).  There is also been interval increase in size of  right middle lobe lung nodule (now 1.4 cm, previously 1 cm).  Other smaller nodules are relatively stable in the interval.  Diffuse bronchial wall thickening with emphysema; imaging findings suggestive of underlying COPD.  Aortic atherosclerosis and three-vessel coronary artery atherosclerotic calcifications noted.       Dispo:  -Keytruda held today.  -STAT CT chest to be done today.  -Return to cancer center in 1 week for follow-up with Dr. [name] and to re-challenge cycle #3 Keytruda.     All questions were answered to patient's stated satisfaction. Encouraged patient to call with any new concerns or questions before his next visit to the cancer center and we can certain see him sooner, if needed.       Orders placed this encounter:  No orders of the defined types were placed in this encounter.     Mike Craze, NP [place] [phone]

## 2018-04-20 ENCOUNTER — Inpatient Hospital Stay (HOSPITAL_COMMUNITY): Payer: Medicare Other

## 2018-04-20 ENCOUNTER — Encounter (HOSPITAL_COMMUNITY): Payer: Self-pay | Admitting: General Practice

## 2018-04-20 ENCOUNTER — Other Ambulatory Visit: Payer: Self-pay

## 2018-04-20 ENCOUNTER — Encounter (HOSPITAL_COMMUNITY): Payer: Self-pay

## 2018-04-20 ENCOUNTER — Inpatient Hospital Stay (HOSPITAL_BASED_OUTPATIENT_CLINIC_OR_DEPARTMENT_OTHER): Payer: Medicare Other | Admitting: Adult Health

## 2018-04-20 ENCOUNTER — Ambulatory Visit (HOSPITAL_COMMUNITY)
Admission: RE | Admit: 2018-04-20 | Discharge: 2018-04-20 | Disposition: A | Discharge status: Home / Self Care | Payer: Medicare Other | Source: Ambulatory Visit | Attending: Adult Health | Admitting: Adult Health

## 2018-04-20 ENCOUNTER — Other Ambulatory Visit (HOSPITAL_COMMUNITY): Payer: Self-pay | Admitting: Adult Health

## 2018-04-20 ENCOUNTER — Encounter (HOSPITAL_COMMUNITY): Payer: Self-pay | Admitting: Adult Health

## 2018-04-20 ENCOUNTER — Telehealth (HOSPITAL_COMMUNITY): Payer: Self-pay

## 2018-04-20 DIAGNOSIS — I2510 Atherosclerotic heart disease of native coronary artery without angina pectoris: Secondary | ICD-10-CM | POA: Insufficient documentation

## 2018-04-20 DIAGNOSIS — R0602 Shortness of breath: Secondary | ICD-10-CM

## 2018-04-20 DIAGNOSIS — C670 Malignant neoplasm of trigone of bladder: Secondary | ICD-10-CM | POA: Diagnosis not present

## 2018-04-20 DIAGNOSIS — Z5112 Encounter for antineoplastic immunotherapy: Secondary | ICD-10-CM | POA: Diagnosis not present

## 2018-04-20 DIAGNOSIS — R059 Cough, unspecified: Secondary | ICD-10-CM

## 2018-04-20 DIAGNOSIS — C679 Malignant neoplasm of bladder, unspecified: Secondary | ICD-10-CM

## 2018-04-20 DIAGNOSIS — R05 Cough: Secondary | ICD-10-CM

## 2018-04-20 DIAGNOSIS — J439 Emphysema, unspecified: Secondary | ICD-10-CM | POA: Diagnosis not present

## 2018-04-20 DIAGNOSIS — C7800 Secondary malignant neoplasm of unspecified lung: Secondary | ICD-10-CM | POA: Diagnosis not present

## 2018-04-20 DIAGNOSIS — I700 Atherosclerosis of aorta: Secondary | ICD-10-CM | POA: Diagnosis not present

## 2018-04-20 DIAGNOSIS — Z9181 History of falling: Secondary | ICD-10-CM | POA: Diagnosis not present

## 2018-04-20 LAB — CBC WITH DIFFERENTIAL/PLATELET
BASOS PCT: 0 %
Basophils Absolute: 0 10*3/uL (ref 0.0–0.1)
Eosinophils Absolute: 0 10*3/uL (ref 0.0–0.7)
Eosinophils Relative: 0 %
HCT: 28.1 % — ABNORMAL LOW (ref 39.0–52.0)
HEMOGLOBIN: 8.8 g/dL — AB (ref 13.0–17.0)
Lymphocytes Relative: 13 %
Lymphs Abs: 0.7 10*3/uL (ref 0.7–4.0)
MCH: 30.8 pg (ref 26.0–34.0)
MCHC: 31.3 g/dL (ref 30.0–36.0)
MCV: 98.3 fL (ref 78.0–100.0)
Monocytes Absolute: 0.6 10*3/uL (ref 0.1–1.0)
Monocytes Relative: 11 %
NEUTROS PCT: 76 %
Neutro Abs: 4.3 10*3/uL (ref 1.7–7.7)
Platelets: 270 10*3/uL (ref 150–400)
RBC: 2.86 MIL/uL — AB (ref 4.22–5.81)
RDW: 14.2 % (ref 11.5–15.5)
WBC: 5.6 10*3/uL (ref 4.0–10.5)

## 2018-04-20 LAB — COMPREHENSIVE METABOLIC PANEL
ALBUMIN: 2.8 g/dL — AB (ref 3.5–5.0)
ALK PHOS: 57 U/L (ref 38–126)
ALT: 8 U/L — AB (ref 17–63)
AST: 14 U/L — ABNORMAL LOW (ref 15–41)
Anion gap: 6 (ref 5–15)
BUN: 35 mg/dL — ABNORMAL HIGH (ref 6–20)
CALCIUM: 8.3 mg/dL — AB (ref 8.9–10.3)
CHLORIDE: 105 mmol/L (ref 101–111)
CO2: 25 mmol/L (ref 22–32)
CREATININE: 2.9 mg/dL — AB (ref 0.61–1.24)
GFR calc Af Amer: 24 mL/min — ABNORMAL LOW (ref >60)
GFR calc non Af Amer: 20 mL/min — ABNORMAL LOW (ref >60)
GLUCOSE: 115 mg/dL — AB (ref 65–99)
Potassium: 4.6 mmol/L (ref 3.5–5.1)
SODIUM: 136 mmol/L (ref 135–145)
Total Bilirubin: 0.3 mg/dL (ref 0.3–1.2)
Total Protein: 6.6 g/dL (ref 6.5–8.1)

## 2018-04-20 LAB — SAMPLE TO BLOOD BANK

## 2018-04-20 LAB — TSH: TSH: 0.713 u[IU]/mL (ref 0.350–4.500)

## 2018-04-20 MED ORDER — HEPARIN SOD (PORK) LOCK FLUSH 100 UNIT/ML IV SOLN
INTRAVENOUS | Status: AC
  Administered 2018-04-20: 500 [IU]
  Filled 2018-04-20: qty 5

## 2018-04-20 MED ORDER — SODIUM CHLORIDE 0.9% FLUSH: 10.0000 mL | INTRAVENOUS | Status: DC | PRN

## 2018-04-20 MED ORDER — HEPARIN SOD (PORK) LOCK FLUSH 100 UNIT/ML IV SOLN: 500.0000 [IU] | Freq: Once | INTRAVENOUS | Status: DC

## 2018-04-20 NOTE — Progress Notes (Signed)
[facility] CSW Progress Note  Brief check in visit w patient in infusion room, patient accompanied by support person Lelon Frohlich.  Provided calendar, encouraged attendance at support group for information and emotional support dealing w advancec cancer.  Patient has significant support from family who live locally.  No concerns at present. Appreciated visit.  Edwyna Shell, LCSW Clinical Social Worker Phone:  [phone]

## 2018-04-20 NOTE — Telephone Encounter (Signed)
Radiology called stating patient was scheduled for CT of chest with contrast today but patients cre was 2.9 and egfr was 20. He cannot have contrast. Reviewed with Dr. Walden Field, who okayed CT of chest without contrast. Notified radiology.

## 2018-04-20 NOTE — Progress Notes (Signed)
Carl Murphy presented for Portacath access and flush. Portacath located rt chest wall accessed with  H 20 needle. Good blood return present. Portacath flushed with 61ml NS.  Procedure without incident. Patient tolerated procedure well.  Pt going to CT for stat CT. Needle in place. Biopatch in place. Remo Lipps RN in CT to remove port needle after CT scan.   Chemo held today due to increased shortness of breath today and inspiratory/expiratory wheezes.

## 2018-04-21 ENCOUNTER — Telehealth (HOSPITAL_COMMUNITY): Payer: Self-pay | Admitting: Adult Health

## 2018-04-21 NOTE — Telephone Encounter (Signed)
Attempted to reach patient to review recent CT chest results done on 04/20/18.   Unable to reach patient; left voicemail asking for return call.  Awaiting return call from patient.    Mike Craze, NP [place] [phone]

## 2018-04-26 ENCOUNTER — Other Ambulatory Visit (HOSPITAL_COMMUNITY): Payer: Self-pay | Admitting: *Deleted

## 2018-04-26 DIAGNOSIS — C670 Malignant neoplasm of trigone of bladder: Secondary | ICD-10-CM

## 2018-04-28 ENCOUNTER — Encounter (HOSPITAL_COMMUNITY): Payer: Self-pay | Admitting: Hematology

## 2018-04-28 ENCOUNTER — Other Ambulatory Visit (HOSPITAL_COMMUNITY): Payer: Self-pay

## 2018-04-28 ENCOUNTER — Inpatient Hospital Stay (HOSPITAL_BASED_OUTPATIENT_CLINIC_OR_DEPARTMENT_OTHER): Payer: Medicare Other | Admitting: Hematology

## 2018-04-28 ENCOUNTER — Inpatient Hospital Stay (HOSPITAL_COMMUNITY): Payer: Medicare Other

## 2018-04-28 VITALS — BP 135/74 | HR 65 | Temp 97.4°F | Resp 18

## 2018-04-28 VITALS — BP 128/57 | HR 63 | Temp 97.5°F | Resp 18 | Wt 160.1 lb

## 2018-04-28 DIAGNOSIS — C670 Malignant neoplasm of trigone of bladder: Secondary | ICD-10-CM

## 2018-04-28 DIAGNOSIS — R439 Unspecified disturbances of smell and taste: Secondary | ICD-10-CM

## 2018-04-28 DIAGNOSIS — Z936 Other artificial openings of urinary tract status: Secondary | ICD-10-CM

## 2018-04-28 DIAGNOSIS — D62 Acute posthemorrhagic anemia: Secondary | ICD-10-CM

## 2018-04-28 DIAGNOSIS — D649 Anemia, unspecified: Secondary | ICD-10-CM | POA: Diagnosis not present

## 2018-04-28 DIAGNOSIS — N189 Chronic kidney disease, unspecified: Secondary | ICD-10-CM | POA: Diagnosis not present

## 2018-04-28 DIAGNOSIS — R634 Abnormal weight loss: Secondary | ICD-10-CM

## 2018-04-28 DIAGNOSIS — C7800 Secondary malignant neoplasm of unspecified lung: Secondary | ICD-10-CM

## 2018-04-28 DIAGNOSIS — R8290 Unspecified abnormal findings in urine: Secondary | ICD-10-CM

## 2018-04-28 DIAGNOSIS — R630 Anorexia: Secondary | ICD-10-CM

## 2018-04-28 DIAGNOSIS — Z5112 Encounter for antineoplastic immunotherapy: Secondary | ICD-10-CM | POA: Diagnosis not present

## 2018-04-28 LAB — CBC WITH DIFFERENTIAL/PLATELET
BASOS ABS: 0 10*3/uL (ref 0.0–0.1)
Basophils Relative: 0 %
EOS ABS: 0 10*3/uL (ref 0.0–0.7)
EOS PCT: 0 %
HCT: 27.4 % — ABNORMAL LOW (ref 39.0–52.0)
HEMOGLOBIN: 8.7 g/dL — AB (ref 13.0–17.0)
LYMPHS ABS: 0.7 10*3/uL (ref 0.7–4.0)
LYMPHS PCT: 14 %
MCH: 30.7 pg (ref 26.0–34.0)
MCHC: 31.8 g/dL (ref 30.0–36.0)
MCV: 96.8 fL (ref 78.0–100.0)
Monocytes Absolute: 0.7 10*3/uL (ref 0.1–1.0)
Monocytes Relative: 14 %
NEUTROS PCT: 72 %
Neutro Abs: 3.6 10*3/uL (ref 1.7–7.7)
PLATELETS: 263 10*3/uL (ref 150–400)
RBC: 2.83 MIL/uL — AB (ref 4.22–5.81)
RDW: 14.4 % (ref 11.5–15.5)
WBC: 4.9 10*3/uL (ref 4.0–10.5)

## 2018-04-28 LAB — COMPREHENSIVE METABOLIC PANEL
ALK PHOS: 59 U/L (ref 38–126)
ALT: 7 U/L — AB (ref 17–63)
AST: 13 U/L — AB (ref 15–41)
Albumin: 2.7 g/dL — ABNORMAL LOW (ref 3.5–5.0)
Anion gap: 7 (ref 5–15)
BUN: 43 mg/dL — AB (ref 6–20)
CALCIUM: 8.4 mg/dL — AB (ref 8.9–10.3)
CHLORIDE: 108 mmol/L (ref 101–111)
CO2: 23 mmol/L (ref 22–32)
Creatinine, Ser: 3.14 mg/dL — ABNORMAL HIGH (ref 0.61–1.24)
GFR calc non Af Amer: 19 mL/min — ABNORMAL LOW (ref >60)
GFR, EST AFRICAN AMERICAN: 22 mL/min — AB (ref >60)
GLUCOSE: 107 mg/dL — AB (ref 65–99)
Potassium: 4.1 mmol/L (ref 3.5–5.1)
SODIUM: 138 mmol/L (ref 135–145)
Total Bilirubin: 0.3 mg/dL (ref 0.3–1.2)
Total Protein: 6.6 g/dL (ref 6.5–8.1)

## 2018-04-28 LAB — LACTATE DEHYDROGENASE: LDH: 129 U/L (ref 98–192)

## 2018-04-28 LAB — SAMPLE TO BLOOD BANK

## 2018-04-28 MED ORDER — SODIUM CHLORIDE 0.9 % IV SOLN
Freq: Once | INTRAVENOUS | Status: AC
Start: 2018-04-28 — End: 2018-04-28
  Administered 2018-04-28: 10:00:00 via INTRAVENOUS

## 2018-04-28 MED ORDER — HEPARIN SOD (PORK) LOCK FLUSH 100 UNIT/ML IV SOLN
500.0000 [IU] | Freq: Once | INTRAVENOUS | Status: AC | PRN
  Administered 2018-04-28: 500 [IU]

## 2018-04-28 MED ORDER — SODIUM CHLORIDE 0.9 % IV SOLN
200.0000 mg | Freq: Once | INTRAVENOUS | Status: AC
  Administered 2018-04-28: 200 mg via INTRAVENOUS
  Filled 2018-04-28: qty 8

## 2018-04-28 NOTE — Progress Notes (Signed)
Okay to tx today per Dr. [name] after review of labs and examination.    Tolerated infusion w/o adverse reaction.  Alert, in no distress.  VSS.  Discharged via wheelchair in c/o spouse.

## 2018-04-28 NOTE — Progress Notes (Signed)
[address] [place], [place] [address]   CLINIC:  Medical Oncology/Hematology  PCP:  [name], [place] [address] [phone]   REASON FOR VISIT:  Follow-up for metastatic bladder cancer.  CURRENT THERAPY: Pembrolizumab every 3 weeks.  BRIEF ONCOLOGIC HISTORY:  Oncology History   Per available records had bladder tumor removed but had extension into the muscularis propria. CT showed no extension beyond the bladder. Has seen 2 urologists in consultation. Adamant about not having a cystectomy and ileal conduit.     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 No findings for local spread of disease or metastatic disease      03/25/2016 Surgery    TURBT 3cm right trigonal bladder tumor. followed by intravesical epirubicin instillation      03/25/2016 Pathology Results    bladder TURBT infiltrative high grade papillary urothelial carcinoma, invades the mulcularis propria (detrusor) no LVI       04/14/2016 Miscellaneous    [name] consultation in [place] for consideration of cystectomy. patient also offered repeat resection with BCG, and CHEMO/XRT      06/17/2016 Pathology Results    TURB- high grade papillary urothelial carcinoma with focal stromal invasion       Radiation Therapy    Planned to be completed on 09/10/2016.      07/22/2016 -  Chemotherapy    The patient had palonosetron (ALOXI) injection 0.25 mg, 0.25 mg, Intravenous,  Once, 2 of 5 cycles Administration: 0.25 mg (07/28/2016)  CARBOplatin (PARAPLATIN) 160 mg in sodium chloride 0.9 % 100 mL chemo infusion, 160 mg (100 % of original dose 160.6 mg), Intravenous,  Once, 2 of 5 cycles Dose modification:   (original dose 160.6 mg, Cycle 1),   (original dose 160.6 mg, Cycle 1),   (original dose 160.6 mg, Cycle 1),   (original dose 160.6 mg, Cycle  2) Administration: 180 mg (07/28/2016)  for chemotherapy treatment.        04/07/2017 Relapse/Recurrence    CT Abd/pelvis: IMPRESSION: 1. Recurrent bladder carcinoma with synchronous disease in the distal right ureter causing severe right hydronephrosis and decreased right renal function. 2. Bilateral renal stones. 3. Aortic atherosclerosis (ICD10-170.0). Coronary artery calcification. 4. Small periumbilical hernia contains a knuckle of unobstructed small bowel.       06/10/2017 PET scan    1. Metachronous urothelial carcinoma within the distal right ureter, as on prior CT. 2. No evidence of nodal metastasis. 3. Bilateral hypermetabolic pulmonary nodules, favoring pulmonary metastasis. Especially given emphysema, 1 or more primary bronchogenic carcinomas cannot be excluded but are felt less likely. 4. Coronary artery atherosclerosis. Aortic Atherosclerosis (ICD10-I70.0). 5. Bladder not well evaluated secondary to hypermetabolic urine within.       06/18/2017 Procedure    Placement of single lumen port a cath via right internal jugular vein. The catheter tip lies at the cavo-atrial junction. A power injectable port a cath was placed and is ready for immediate use.      06/29/2017 -  Chemotherapy    Carboplatin/Gemcitabine every 21 days       08/14/2017 Imaging    CT C/A/P IMPRESSION: 1. Overall stable pulmonary nodules. Knee soft tissue rim around the cavitary lesion in the left upper lobe is slightly thinner. No new pulmonary nodules or progressive findings in the chest. 2. Stable advanced emphysematous changes and pulmonary  scarring. 3. No mediastinal or hilar mass or lymphadenopathy. 4. Improved CT appearance of the bladder. The asymmetric areas of bladder wall enhancement have improved. There is still diffuse irregular bladder wall thickening and distal right ureteral tumor appears stable. Stable right-sided hydroureteronephrosis. 5. Stable cirrhotic changes  involving the liver and stable liver cysts. 6. Stable scattered mesenteric and retroperitoneal and pelvic lymph nodes but no mass or overt adenopathy. 7. Stable severe/advanced atherosclerotic calcifications involving the thoracic and abdominal aorta and branch vessels.      11/18/2017 Imaging    PET CT: IMPRESSION: 1. Significant partial metabolic response. Bilateral mildly hypermetabolic pulmonary metastases are decreased in size and metabolism. No new or progressive hypermetabolic metastatic disease. 2. Stable hypermetabolic distal right ureteral tumor with marked right hydroureteronephrosis. 3. Chronic findings include: Aortic Atherosclerosis (ICD10-I70.0) and Emphysema (ICD10-J43.9). Three-vessel coronary atherosclerosis. Stable small fat containing umbilical hernia.      03/02/2018 Imaging    Whole body PET CT scan  IMPRESSION: 1. Progressive pulmonary metastatic disease with enlarging and progressively hypermetabolic pulmonary nodules as described above. 2. New nodularity at the left lung base is hypermetabolic but could be an inflammatory or infectious process. 3. Stable hypermetabolic tumor in the right distal ureter. 4. No evidence of metastatic disease involving the abdomen/pelvis or osseous structures. 5. Chronic bilateral hydroureteronephrosis and asymmetric bladder wall thickening. 6. Stable severe atherosclerotic disease      03/08/2018 -  Chemotherapy    The patient had pembrolizumab (KEYTRUDA) 200 mg in sodium chloride 0.9 % 50 mL chemo infusion, 200 mg, Intravenous, Once, 3 of 4 cycles Administration: 200 mg (03/09/2018), 200 mg (03/30/2018), 200 mg (04/28/2018)  for chemotherapy treatment.         CANCER STAGING: Cancer Staging Malignant neoplasm of trigone of bladder ([facility]) Staging form: Urinary Bladder, AJCC 7th Edition - Clinical stage from 05/15/2016: Stage II (T2, N0, M0) - Signed by Baird Cancer, PA-C on 08/11/2016 - Pathologic stage from  06/29/2017: Stage IV (T2a, N0, M1) - Signed by Baird Cancer, PA-C on 06/29/2017    INTERVAL HISTORY:  Mr. Stanbery 72 y.o. male returns for next cycle of immunotherapy.  His immunotherapy with pembrolizumab was held last week because of shortness of breath.  A CT scan was ordered.  This was to rule out pneumonitis.  This was done on 04/20/2018.  Patient's primary doctor gave him albuterol inhaler.  He used it one time and felt better.  His energy levels are about the same.  He is continuing to have pink urine through the right nephrostomy bag.  Does not report any pain.  He does not have any appetite and lost his taste sensation.  He is only drinking 1 Ensure per week as it causes diarrhea.  He lost about 6 pounds in the last 1 month.  REVIEW OF SYSTEMS:  Review of Systems  Constitutional: Positive for appetite change, fatigue and unexpected weight change.  Respiratory: Positive for shortness of breath.   Genitourinary: Positive for hematuria.   All other systems reviewed and are negative.    PAST MEDICAL/SURGICAL HISTORY:  Past Medical History:  Diagnosis Date  . Arthritis   . Cancer [facility])    Bladder Cancer  . CHF (congestive heart failure) ([facility])   . COPD (chronic obstructive pulmonary disease) ([facility])   . Depression   . Hard of hearing    bilat   . Hypertension   . Malignant neoplasm of trigone of bladder ([facility]) 03/25/2016  . PONV (postoperative nausea and vomiting)   .  Shortness of breath dyspnea    heat; walking up set of stairs; pt states can walk up 12-14 stairs slowly without having to stop to catch his breath   Past Surgical History:  Procedure Laterality Date  . CYSTOSCOPY N/A 06/16/2016   Procedure: CYSTOSCOPY;  Surgeon: Franchot Gallo, MD;  Location: [facility];  Service: Urology;  Laterality: N/A;  . IR FLUORO GUIDE PORT INSERTION RIGHT  06/18/2017  . IR NEPHROSTOMY PLACEMENT LEFT  03/25/2018  . IR NEPHROSTOMY PLACEMENT RIGHT  03/25/2018  . IR US GUIDE VASC ACCESS RIGHT   06/18/2017  . TRANSURETHRAL RESECTION OF BLADDER TUMOR N/A 03/25/2016   Procedure: TRANSURETHRAL RESECTION OF BLADDER TUMOR (TURBT);  Surgeon: Franchot Gallo, MD;  Location: [facility];  Service: Urology;  Laterality: N/A;  . TRANSURETHRAL RESECTION OF BLADDER TUMOR N/A 06/16/2016   Procedure: TRANSURETHRAL RESECTION OF BLADDER TUMOR (TURBT);  Surgeon: Franchot Gallo, MD;  Location: [facility];  Service: Urology;  Laterality: N/A;  . TRANSURETHRAL RESECTION OF BLADDER TUMOR N/A 12/16/2016   Procedure: TRANSURETHRAL RESECTION OF BLADDER TUMOR (TURBT);  Surgeon: Franchot Gallo, MD;  Location: [facility];  Service: Urology;  Laterality: N/A;  . TRANSURETHRAL RESECTION OF BLADDER TUMOR N/A 05/11/2017   Procedure: TRANSURETHRAL RESECTION OF BLADDER TUMOR (TURBT) CLOT EVACUATION;  Surgeon: Franchot Gallo, MD;  Location: [facility];  Service: Urology;  Laterality: N/A;  [facility] BLUE MEDICARE-[id number]  . TRANSURETHRAL RESECTION OF BLADDER TUMOR N/A 04/01/2018   Procedure: TRANSURETHRAL RESECTION OF BLADDER TUMOR (TURBT);  Surgeon: Franchot Gallo, MD;  Location: [facility];  Service: Urology;  Laterality: N/A;  1 HR [phone] BLUE MEDICARE-[id number]     SOCIAL HISTORY:  Social History   Socioeconomic History  . Marital status: Married    Spouse name: Not on file  . Number of children: Not on file  . Years of education: Not on file  . Highest education level: Not on file  Occupational History  . Not on file  Social Needs  . Financial resource strain: Not on file  . Food insecurity:    Worry: Not on file    Inability: Not on file  . Transportation needs:    Medical: Not on file    Non-medical: Not on file  Tobacco Use  . Smoking status: Current Every Day Smoker    Packs/day: 2.00    Years: 50.00    Pack years: 100.00    Types: Cigarettes  . Smokeless tobacco: Never Used  Substance and Sexual Activity  . Alcohol use: No    Comment: occasionally  . Drug use: No  .  Sexual activity: Never    Birth control/protection: None  Lifestyle  . Physical activity:    Days per week: Not on file    Minutes per session: Not on file  . Stress: Not on file  Relationships  . Social connections:    Talks on phone: Not on file    Gets together: Not on file    Attends religious service: Not on file    Active member of club or organization: Not on file    Attends meetings of clubs or organizations: Not on file    Relationship status: Not on file  . Intimate partner violence:    Fear of current or ex partner: Not on file    Emotionally abused: Not on file    Physically abused: Not on file    Forced sexual activity: Not on file  Other Topics Concern  . Not on file  Social  History Narrative  . Not on file    FAMILY HISTORY:  Family History  Problem Relation Age of Onset  . Hypertension Mother   . Asthma Father   . Emphysema Father     CURRENT MEDICATIONS:  Outpatient Encounter Medications as of 04/28/2018  Medication Sig  . acetaminophen (TYLENOL) 500 MG tablet Take 500 mg by mouth as needed (PRN for pain).  Marland Kitchen amLODipine (NORVASC) 5 MG tablet Take 5 mg by mouth daily.   Huey Bienenstock (TECENTRIQ IV) Inject into the vein. Every 21 days  . carvedilol (COREG) 25 MG tablet Take 25 mg by mouth 2 (two) times daily.   . cetirizine (ZYRTEC) 10 MG tablet Take 10 mg by mouth daily.  . citalopram (CELEXA) 20 MG tablet Take 20 mg by mouth at bedtime.   . Diphenhyd-Hydrocort-Nystatin (FIRST-DUKES MOUTHWASH) SUSP Use as directed 5 mLs in the mouth or throat 4 (four) times daily -  before meals and at bedtime. (Patient not taking: Reported on 03/08/2018)  . fluticasone (FLONASE) 50 MCG/ACT nasal spray Place 2 sprays into both nostrils daily as needed for allergies or rhinitis.  Marland Kitchen lidocaine (XYLOCAINE) 2 % solution Swish and swallow 12m three to four times a day as needed for throat pain. (Patient not taking: Reported on 03/08/2018)  . ondansetron (ZOFRAN-ODT) 4 MG  disintegrating tablet Take 1 tablet (4 mg total) by mouth every 8 (eight) hours as needed for nausea or vomiting. (Patient not taking: Reported on 04/20/2018)  . oxybutynin (DITROPAN) 5 MG tablet Take 1 tablet (5 mg total) by mouth every 6 (six) hours as needed for bladder spasms. (Patient not taking: Reported on 04/20/2018)  . oxyCODONE (ROXICODONE) 5 MG immediate release tablet Take 1 tablet (5 mg total) by mouth every 4 (four) hours as needed for severe pain. (Patient not taking: Reported on 04/20/2018)  . prochlorperazine (COMPAZINE) 10 MG tablet Take 1 tablet (10 mg total) by mouth every 6 (six) hours as needed for nausea or vomiting. (Patient not taking: Reported on 04/20/2018)  . VENTOLIN HFA 108 (90 Base) MCG/ACT inhaler    No facility-administered encounter medications on file as of 04/28/2018.     ALLERGIES:  Allergies  Allergen Reactions  . Carboplatin Shortness Of Breath    Reaction occurred after patient left [facility]. Pt reported extreme sob after leaving [facility] that eventually resolved on its own.     PHYSICAL EXAM:  ECOG Performance status: 1  Vitals:   04/28/18 0842  BP: (!) 128/57  Pulse: 63  Resp: 18  Temp: (!) 97.5 F (36.4 C)  SpO2: 98%   Filed Weights   04/28/18 0842  Weight: 160 lb 1.6 oz (72.6 kg)    Physical Exam HEENT shows oropharynx has no thrush.  No mucositis. Chest is bilaterally clear to auscultation. Cardiovascular S1-S2 regular rate and rhythm. Extremities no edema or cyanosis. Right nephrostomy bag has pinkish urine.  LABORATORY DATA:  I have reviewed the labs as listed.  CBC    Component Value Date/Time   WBC 4.9 04/28/2018 0810   RBC 2.83 (L) 04/28/2018 0810   HGB 8.7 (L) 04/28/2018 0810   HCT 27.4 (L) 04/28/2018 0810   PLT 263 04/28/2018 0810   MCV 96.8 04/28/2018 0810   MCH 30.7 04/28/2018 0810   MCHC 31.8 04/28/2018 0810   RDW 14.4 04/28/2018 0810   LYMPHSABS 0.7 04/28/2018 0810   MONOABS 0.7 04/28/2018 0810   EOSABS 0.0  04/28/2018 0810   BASOSABS 0.0 04/28/2018 0810  CMP Latest Ref Rng & Units 04/28/2018 04/20/2018 04/07/2018  Glucose 65 - 99 mg/dL 107(H) 115(H) 102(H)  BUN 6 - 20 mg/dL 43(H) 35(H) 44(H)  Creatinine 0.61 - 1.24 mg/dL 3.14(H) 2.90(H) 3.33(H)  Sodium 135 - 145 mmol/L 138 136 138  Potassium 3.5 - 5.1 mmol/L 4.1 4.6 4.1  Chloride 101 - 111 mmol/L 108 105 106  CO2 22 - 32 mmol/L _0 Calcium 8.9 - 10.3 mg/dL 8.4(L) 8.3(L) 8.2(L)  Total Protein 6.5 - 8.1 g/dL 6.6 6.6 6.6  Total Bilirubin 0.3 - 1.2 mg/dL 0.3 0.3 0.6  Alkaline Phos 38 - 126 U/L 59 57 63  AST 15 - 41 U/L 13(L) 14(L) 17  ALT 17 - 63 U/L 7(L) 8(L) 7(L)       DIAGNOSTIC IMAGING:  I have independently reviewed CT scan images dated 04/20/2018 and agree with radiologist report.     ASSESSMENT & PLAN:   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  Stage IV bladder cancer with lung metastasis (DX 06/29/2017): -Status post 6 cycles of carboplatin and gemcitabine completed on 11/02/2017 -PET/CT scan on 11/18/2017 showing significant partial metabolic response -Maintenance gemcitabine day 1 and day 15 every 28 days started on 12/03/2017, PET CT scan showing worsening of pulmonary metastatic disease -Pembrolizumab started on 03/09/2018, tolerated very well except decrease in appetite.  Cycle 2 was on 03/30/2018. -She came in last week for cycle 3.  However he was very short of breath.  Treatment was held and a CT scan was ordered.  This was to rule out pneumonitis.  I discussed the results of the CT scan of the chest from 04/20/2018 which showed no evidence of pneumonitis.  Left upper lobe cavitary nodule has slightly increased in size measuring 1.8 x 2.3 cm.  This was measuring 1.6 x 1.7 cm on previous PET scan on 03/01/2018.  Right upper lobe nodule was stable.  Right middle lobe lung nodule increased to 1.4 from 1 cm. - He underwent TURBT on 04/01/2018, biopsy was consistent with invasive high-grade papillary urothelial  carcinoma. -His breathing improved after his PMD gave albuterol inhaler.  He has used it only one time.  He will continue that as prescribed.  Today he may proceed with next cycle of pembrolizumab.  I do not think the findings on the CT scan represent progression, as the increase in size is few millimeters and a CT with contrast was compared to PET/CT without contrast. - I have also talked to him about new FGFR3 receptor inhibitor Erdafitinib (response rates close to 30%) which was be approved recently.  I have recommended sending his tumor for foundation 1 testing.  He is agreeable to this.  I will see him back in 3 weeks for follow-up.  2.  CKD: His creatinine has gone up to 3.14 today.  He underwent bilateral percutaneous nephrostomy tube placement on 03/25/2018.  Right nephrostomy tube is draining bloody secretions.  Likely secondary to tumor in the right ureter.  He underwent TURBT on 04/01/2018.  3.  Anemia: He had 2 units of blood transfusion few weeks ago.  His hemoglobin today is 8.7.  He does not require any transfusion.  I will check his ferritin, iron panel to see if he benefits from parenteral iron.  We will also consider erythropoiesis stimulating agents.  4.  Weight loss: -She lost about 6 pounds in the last 1 month.  He does not have any appetite or taste.  He is drinking 1 Ensure per  week.  More than that causes diarrhea.  I have suggested him to try to boost instead.  If he continues to lose, we will consider appetite stimulants like Marinol.      Orders placed this encounter:  Orders Placed This Encounter  Procedures  . CBC with Differential  . Comprehensive metabolic panel  . Lactate dehydrogenase  . Iron and TIBC  . Ferritin  . Vitamin B12      Derek Jack, [place] [phone]

## 2018-04-28 NOTE — Assessment & Plan Note (Addendum)
1.  Stage IV bladder cancer with lung metastasis (DX 06/29/2017): -Status post 6 cycles of carboplatin and gemcitabine completed on 11/02/2017 -PET/CT scan on 11/18/2017 showing significant partial metabolic response -Maintenance gemcitabine day 1 and day 15 every 28 days started on 12/03/2017, PET CT scan showing worsening of pulmonary metastatic disease -Pembrolizumab started on 03/09/2018, tolerated very well except decrease in appetite.  Cycle 2 was on 03/30/2018. -She came in last week for cycle 3.  However he was very short of breath.  Treatment was held and a CT scan was ordered.  This was to rule out pneumonitis.  I discussed the results of the CT scan of the chest from 04/20/2018 which showed no evidence of pneumonitis.  Left upper lobe cavitary nodule has slightly increased in size measuring 1.8 x 2.3 cm.  This was measuring 1.6 x 1.7 cm on previous PET scan on 03/01/2018.  Right upper lobe nodule was stable.  Right middle lobe lung nodule increased to 1.4 from 1 cm. - He underwent TURBT on 04/01/2018, biopsy was consistent with invasive high-grade papillary urothelial carcinoma. -His breathing improved after his PMD gave albuterol inhaler.  He has used it only one time.  He will continue that as prescribed.  Today he may proceed with next cycle of pembrolizumab.  I do not think the findings on the CT scan represent progression, as the increase in size is few millimeters and a CT with contrast was compared to PET/CT without contrast. - I have also talked to him about new FGFR3 receptor inhibitor Erdafitinib (response rates close to 30%) which was be approved recently.  I have recommended sending his tumor for foundation 1 testing.  He is agreeable to this.  I will see him back in 3 weeks for follow-up.  2.  CKD: His creatinine has gone up to 3.14 today.  He underwent bilateral percutaneous nephrostomy tube placement on 03/25/2018.  Right nephrostomy tube is draining bloody secretions.  Likely secondary to  tumor in the right ureter.  He underwent TURBT on 04/01/2018.  3.  Anemia: He had 2 units of blood transfusion few weeks ago.  His hemoglobin today is 8.7.  He does not require any transfusion.  I will check his ferritin, iron panel to see if he benefits from parenteral iron.  We will also consider erythropoiesis stimulating agents.  4.  Weight loss: -She lost about 6 pounds in the last 1 month.  He does not have any appetite or taste.  He is drinking 1 Ensure per week.  More than that causes diarrhea.  I have suggested him to try to boost instead.  If he continues to lose, we will consider appetite stimulants like Marinol.

## 2018-05-04 ENCOUNTER — Ambulatory Visit (INDEPENDENT_AMBULATORY_CARE_PROVIDER_SITE_OTHER): Payer: Medicare Other | Admitting: Urology

## 2018-05-04 DIAGNOSIS — C678 Malignant neoplasm of overlapping sites of bladder: Secondary | ICD-10-CM | POA: Diagnosis not present

## 2018-05-19 ENCOUNTER — Encounter (HOSPITAL_COMMUNITY): Payer: Self-pay | Admitting: Hematology

## 2018-05-19 ENCOUNTER — Inpatient Hospital Stay (HOSPITAL_BASED_OUTPATIENT_CLINIC_OR_DEPARTMENT_OTHER): Payer: Medicare Other | Admitting: Hematology

## 2018-05-19 ENCOUNTER — Inpatient Hospital Stay (HOSPITAL_COMMUNITY): Payer: Medicare Other | Attending: Hematology

## 2018-05-19 ENCOUNTER — Inpatient Hospital Stay (HOSPITAL_COMMUNITY): Payer: Medicare Other

## 2018-05-19 ENCOUNTER — Other Ambulatory Visit: Payer: Self-pay

## 2018-05-19 VITALS — BP 139/73 | HR 58 | Temp 97.8°F | Resp 18

## 2018-05-19 DIAGNOSIS — C670 Malignant neoplasm of trigone of bladder: Secondary | ICD-10-CM | POA: Diagnosis not present

## 2018-05-19 DIAGNOSIS — C7800 Secondary malignant neoplasm of unspecified lung: Secondary | ICD-10-CM | POA: Diagnosis present

## 2018-05-19 DIAGNOSIS — Z936 Other artificial openings of urinary tract status: Secondary | ICD-10-CM

## 2018-05-19 DIAGNOSIS — R5383 Other fatigue: Secondary | ICD-10-CM

## 2018-05-19 DIAGNOSIS — Z720 Tobacco use: Secondary | ICD-10-CM

## 2018-05-19 DIAGNOSIS — R197 Diarrhea, unspecified: Secondary | ICD-10-CM | POA: Diagnosis not present

## 2018-05-19 DIAGNOSIS — Z5112 Encounter for antineoplastic immunotherapy: Secondary | ICD-10-CM | POA: Diagnosis not present

## 2018-05-19 DIAGNOSIS — N189 Chronic kidney disease, unspecified: Secondary | ICD-10-CM | POA: Diagnosis not present

## 2018-05-19 DIAGNOSIS — M545 Low back pain: Secondary | ICD-10-CM | POA: Diagnosis not present

## 2018-05-19 DIAGNOSIS — N184 Chronic kidney disease, stage 4 (severe): Secondary | ICD-10-CM

## 2018-05-19 DIAGNOSIS — D649 Anemia, unspecified: Secondary | ICD-10-CM | POA: Diagnosis not present

## 2018-05-19 LAB — CBC WITH DIFFERENTIAL/PLATELET
BASOS PCT: 0 %
Basophils Absolute: 0 10*3/uL (ref 0.0–0.1)
Eosinophils Absolute: 0 10*3/uL (ref 0.0–0.7)
Eosinophils Relative: 0 %
HEMATOCRIT: 26.2 % — AB (ref 39.0–52.0)
HEMOGLOBIN: 8.3 g/dL — AB (ref 13.0–17.0)
LYMPHS PCT: 13 %
Lymphs Abs: 0.9 10*3/uL (ref 0.7–4.0)
MCH: 30.4 pg (ref 26.0–34.0)
MCHC: 31.7 g/dL (ref 30.0–36.0)
MCV: 96 fL (ref 78.0–100.0)
MONO ABS: 0.7 10*3/uL (ref 0.1–1.0)
MONOS PCT: 10 %
NEUTROS PCT: 77 %
Neutro Abs: 5 10*3/uL (ref 1.7–7.7)
Platelets: 229 10*3/uL (ref 150–400)
RBC: 2.73 MIL/uL — ABNORMAL LOW (ref 4.22–5.81)
RDW: 15.6 % — ABNORMAL HIGH (ref 11.5–15.5)
WBC: 6.5 10*3/uL (ref 4.0–10.5)

## 2018-05-19 LAB — COMPREHENSIVE METABOLIC PANEL
ALBUMIN: 3 g/dL — AB (ref 3.5–5.0)
ALT: 8 U/L — ABNORMAL LOW (ref 17–63)
ANION GAP: 11 (ref 5–15)
AST: 15 U/L (ref 15–41)
Alkaline Phosphatase: 59 U/L (ref 38–126)
BILIRUBIN TOTAL: 0.3 mg/dL (ref 0.3–1.2)
BUN: 54 mg/dL — ABNORMAL HIGH (ref 6–20)
CALCIUM: 7.9 mg/dL — AB (ref 8.9–10.3)
CO2: 22 mmol/L (ref 22–32)
Chloride: 107 mmol/L (ref 101–111)
Creatinine, Ser: 3.97 mg/dL — ABNORMAL HIGH (ref 0.61–1.24)
GFR calc Af Amer: 16 mL/min — ABNORMAL LOW (ref >60)
GFR, EST NON AFRICAN AMERICAN: 14 mL/min — AB (ref >60)
GLUCOSE: 99 mg/dL (ref 65–99)
Potassium: 4.6 mmol/L (ref 3.5–5.1)
Sodium: 140 mmol/L (ref 135–145)
TOTAL PROTEIN: 6.9 g/dL (ref 6.5–8.1)

## 2018-05-19 LAB — IRON AND TIBC
Iron: 54 ug/dL (ref 45–182)
Saturation Ratios: 18 % (ref 17.9–39.5)
TIBC: 297 ug/dL (ref 250–450)
UIBC: 243 ug/dL

## 2018-05-19 LAB — SAMPLE TO BLOOD BANK

## 2018-05-19 LAB — VITAMIN B12: VITAMIN B 12: 232 pg/mL (ref 180–914)

## 2018-05-19 LAB — FERRITIN: Ferritin: 131 ng/mL (ref 24–336)

## 2018-05-19 LAB — LACTATE DEHYDROGENASE: LDH: 143 U/L (ref 98–192)

## 2018-05-19 MED ORDER — SODIUM CHLORIDE 0.9 % IV SOLN
510.0000 mg | Freq: Once | INTRAVENOUS | Status: AC
  Administered 2018-05-19: 510 mg via INTRAVENOUS
  Filled 2018-05-19: qty 17

## 2018-05-19 MED ORDER — HEPARIN SOD (PORK) LOCK FLUSH 100 UNIT/ML IV SOLN
500.0000 [IU] | Freq: Once | INTRAVENOUS | Status: AC | PRN
  Administered 2018-05-19: 500 [IU]

## 2018-05-19 MED ORDER — SODIUM CHLORIDE 0.9 % IV SOLN
200.0000 mg | Freq: Once | INTRAVENOUS | Status: AC
  Administered 2018-05-19: 200 mg via INTRAVENOUS
  Filled 2018-05-19: qty 8

## 2018-05-19 MED ORDER — SODIUM CHLORIDE 0.9 % IV SOLN
Freq: Once | INTRAVENOUS | Status: AC
  Administered 2018-05-19: 11:00:00 via INTRAVENOUS

## 2018-05-19 NOTE — Assessment & Plan Note (Addendum)
1.  Stage IV bladder cancer with lung metastasis (DX 06/29/2017): Foundation 1 shows MS-stable, TMB-intermediate,HRAS-G12S (clinical trails with binimetinib, cobimetinib, trametinib) -Status post 6 cycles of carboplatin and gemcitabine completed on 11/02/2017 -PET/CT scan on 11/18/2017 showing significant partial metabolic response -Maintenance gemcitabine day 1 and day 15 every 28 days started on 12/03/2017, PET CT scan showing worsening of pulmonary metastatic disease -3 cycles of Keytruda from 03/09/2018 through 04/28/2018 - CT scan of the chest on 04/20/2018 which showed no evidence of pneumonitis.  Left upper lobe cavitary nodule has slightly increased in size measuring 1.8 x 2.3 cm.  This was measuring 1.6 x 1.7 cm on previous PET scan on 03/01/2018.  Right upper lobe nodule was stable.  Right middle lobe lung nodule increased to 1.4 from 1 cm.  This was done for increased shortness of breath, which improved with inhalers prescribed by his PMD.  I did not think the findings on the CT scan represented progression as the increase in size was too small and a CT with contrast was compared to PET/CT without contrast. - He underwent TURBT on 04/01/2018, biopsy was consistent with invasive high-grade papillary urothelial carcinoma. -I have discussed the results of the foundation 1 testing.  Unfortunately he does not have any actionable mutations. -He will proceed with cycle 4 of Keytruda today.  I will arrange CT scans prior to next visit.  2.  CKD: Creatinine went up to 3.9 today.  He underwent bilateral percutaneous nephrostomy tube placement on 03/25/2018.  He underwent TURBT on 04/01/2018.  Right nephrostomy tube is not putting out much.  Left nephrostomy tube is draining well.  He has tube changes scheduled tomorrow by IR at [facility].  3.  Anemia: He is requiring intermittent transfusions.  I will give him Feraheme infusion at each visit x2.  4.  Weight loss: -He is able to eat well as he has developed taste  for food.  His weight has gone up by 8 pounds.  Part of this could be fluid retention.  He is drinking 1 Ensure a week.

## 2018-05-19 NOTE — Progress Notes (Signed)
Carl Murphy, [place] [address]   CLINIC:  Medical Oncology/Hematology  PCP:  Carl Murphy, [place] [address] [phone]   REASON FOR VISIT:  Follow-up for metastatic bladder Murphy.  CURRENT THERAPY: Keytruda every 3 weeks.  BRIEF ONCOLOGIC HISTORY:  Oncology History   Per available records had bladder tumor removed but had extension into the muscularis propria. CT showed no extension beyond the bladder. Has seen 2 urologists in consultation. Carl Murphy about not having a cystectomy and ileal conduit.     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 No findings for local spread of disease or metastatic disease      03/25/2016 Surgery    TURBT 3cm right trigonal bladder tumor. followed by intravesical epirubicin instillation      03/25/2016 Pathology Results    bladder TURBT infiltrative high grade papillary urothelial carcinoma, invades the mulcularis propria (detrusor) no LVI       04/14/2016 Miscellaneous    Carl Murphy consultation in [place] for consideration of cystectomy. patient also offered repeat resection with BCG, and CHEMO/XRT      06/17/2016 Pathology Results    TURB- high grade papillary urothelial carcinoma with focal stromal invasion       Radiation Therapy    Planned to be completed on 09/10/2016.      07/22/2016 -  Chemotherapy    The patient had palonosetron (ALOXI) injection 0.25 mg, 0.25 mg, Intravenous,  Once, 2 of 5 cycles Administration: 0.25 mg (07/28/2016)  CARBOplatin (PARAPLATIN) 160 mg in sodium chloride 0.9 % 100 mL chemo infusion, 160 mg (100 % of original dose 160.6 mg), Intravenous,  Once, 2 of 5 cycles Dose modification:   (original dose 160.6 mg, Cycle 1),   (original dose 160.6 mg, Cycle 1),   (original dose 160.6 mg, Cycle 1),   (original dose 160.6 mg, Cycle  2) Administration: 180 mg (07/28/2016)  for chemotherapy treatment.        04/07/2017 Relapse/Recurrence    CT Abd/pelvis: IMPRESSION: 1. Recurrent bladder carcinoma with synchronous disease in the distal right ureter causing severe right hydronephrosis and decreased right renal function. 2. Bilateral renal stones. 3. Aortic atherosclerosis (ICD10-170.0). Coronary artery calcification. 4. Small periumbilical hernia contains a knuckle of unobstructed small bowel.       06/10/2017 PET scan    1. Metachronous urothelial carcinoma within the distal right ureter, as on prior CT. 2. No evidence of nodal metastasis. 3. Bilateral hypermetabolic pulmonary nodules, favoring pulmonary metastasis. Especially given emphysema, 1 or more primary bronchogenic carcinomas cannot be excluded but are felt less likely. 4. Coronary artery atherosclerosis. Aortic Atherosclerosis (ICD10-I70.0). 5. Bladder not well evaluated secondary to hypermetabolic urine within.       06/18/2017 Procedure    Placement of single lumen port a cath via right internal jugular vein. The catheter tip lies at the cavo-atrial junction. A power injectable port a cath was placed and is ready for immediate use.      06/29/2017 -  Chemotherapy    Carboplatin/Gemcitabine every 21 days       08/14/2017 Imaging    CT C/A/P IMPRESSION: 1. Overall stable pulmonary nodules. Knee soft tissue rim around the cavitary lesion in the left upper lobe is slightly thinner. No new pulmonary nodules or progressive findings in the chest. 2. Stable advanced emphysematous changes and pulmonary  scarring. 3. No mediastinal or hilar mass or lymphadenopathy. 4. Improved CT appearance of the bladder. The asymmetric areas of bladder wall enhancement have improved. There is still diffuse irregular bladder wall thickening and distal right ureteral tumor appears stable. Stable right-sided hydroureteronephrosis. 5. Stable cirrhotic changes  involving the liver and stable liver cysts. 6. Stable scattered mesenteric and retroperitoneal and pelvic lymph nodes but no mass or overt adenopathy. 7. Stable severe/advanced atherosclerotic calcifications involving the thoracic and abdominal aorta and branch vessels.      11/18/2017 Imaging    PET CT: IMPRESSION: 1. Significant partial metabolic response. Bilateral mildly hypermetabolic pulmonary metastases are decreased in size and metabolism. No new or progressive hypermetabolic metastatic disease. 2. Stable hypermetabolic distal right ureteral tumor with marked right hydroureteronephrosis. 3. Chronic findings include: Aortic Atherosclerosis (ICD10-I70.0) and Emphysema (ICD10-J43.9). Three-vessel coronary atherosclerosis. Stable small fat containing umbilical hernia.      03/02/2018 Imaging    Whole body PET CT scan  IMPRESSION: 1. Progressive pulmonary metastatic disease with enlarging and progressively hypermetabolic pulmonary nodules as described above. 2. New nodularity at the left lung base is hypermetabolic but could be an inflammatory or infectious process. 3. Stable hypermetabolic tumor in the right distal ureter. 4. No evidence of metastatic disease involving the abdomen/pelvis or osseous structures. 5. Chronic bilateral hydroureteronephrosis and asymmetric bladder wall thickening. 6. Stable severe atherosclerotic disease      03/08/2018 -  Chemotherapy    The patient had pembrolizumab (KEYTRUDA) 200 mg in sodium chloride 0.9 % 50 mL chemo infusion, 200 mg, Intravenous, Once, 3 of 4 cycles Administration: 200 mg (03/09/2018), 200 mg (03/30/2018), 200 mg (04/28/2018)  for chemotherapy treatment.         Murphy STAGING: Murphy Staging Malignant neoplasm of trigone of bladder ([place]) Staging form: Urinary Bladder, AJCC 7th Edition - Clinical stage from 05/15/2016: Stage II (T2, N0, M0) - Signed by Carl Cancer, PA-C on 08/11/2016 - Pathologic stage from  06/29/2017: Stage IV (T2a, N0, M1) - Signed by Carl Cancer, PA-C on 06/29/2017    INTERVAL HISTORY:  Carl Murphy 72 y.o. male returns for next cycle of immunotherapy.  Overall he is feeling great as he is eating better.  His taste buds improved.  He went fishing one day in the last 3 weeks.  He has occasional diarrhea.  He drinks Ensure 1 can a week, as drinking more would give him diarrhea.  Denies any nausea or vomiting.  Fatigue is stable.  Complained of low back pain which started this morning.  No fevers or infections noted.    REVIEW OF SYSTEMS:  Review of Systems  Constitutional: Positive for fatigue.  Cardiovascular: Positive for leg swelling.  Gastrointestinal: Positive for diarrhea.  Musculoskeletal: Positive for back pain.  Hematological: Bruises/bleeds easily.  All other systems reviewed and are negative.    PAST MEDICAL/SURGICAL HISTORY:  Past Medical History:  Diagnosis Date  . Arthritis   . [facility])    Bladder Murphy  . CHF (congestive heart failure) ([facility])   . COPD (chronic obstructive pulmonary disease) ([facility])   . Depression   . Hard of hearing    bilat   . Hypertension   . Malignant neoplasm of trigone of bladder ([facility]) 03/25/2016  . PONV (postoperative nausea and vomiting)   . Shortness of breath dyspnea    heat; walking up set of stairs; pt states can walk up 12-14 stairs slowly without having to stop to catch his breath   Past Surgical History:  Procedure Laterality Date  . CYSTOSCOPY N/A 06/16/2016   Procedure: CYSTOSCOPY;  Surgeon: Franchot Gallo, MD;  Location: [facility];  Service: Urology;  Laterality: N/A;  . IR FLUORO GUIDE PORT INSERTION RIGHT  06/18/2017  . IR NEPHROSTOMY PLACEMENT LEFT  03/25/2018  . IR NEPHROSTOMY PLACEMENT RIGHT  03/25/2018  . IR US GUIDE VASC ACCESS RIGHT  06/18/2017  . TRANSURETHRAL RESECTION OF BLADDER TUMOR N/A 03/25/2016   Procedure: TRANSURETHRAL RESECTION OF BLADDER TUMOR (TURBT);  Surgeon: Franchot Gallo, MD;   Location: [facility];  Service: Urology;  Laterality: N/A;  . TRANSURETHRAL RESECTION OF BLADDER TUMOR N/A 06/16/2016   Procedure: TRANSURETHRAL RESECTION OF BLADDER TUMOR (TURBT);  Surgeon: Franchot Gallo, MD;  Location: [facility];  Service: Urology;  Laterality: N/A;  . TRANSURETHRAL RESECTION OF BLADDER TUMOR N/A 12/16/2016   Procedure: TRANSURETHRAL RESECTION OF BLADDER TUMOR (TURBT);  Surgeon: Franchot Gallo, MD;  Location: [facility];  Service: Urology;  Laterality: N/A;  . TRANSURETHRAL RESECTION OF BLADDER TUMOR N/A 05/11/2017   Procedure: TRANSURETHRAL RESECTION OF BLADDER TUMOR (TURBT) CLOT EVACUATION;  Surgeon: Franchot Gallo, MD;  Location: [facility];  Service: Urology;  Laterality: N/A;  [place] BLUE MEDICARE-[id number]  . TRANSURETHRAL RESECTION OF BLADDER TUMOR N/A 04/01/2018   Procedure: TRANSURETHRAL RESECTION OF BLADDER TUMOR (TURBT);  Surgeon: Franchot Gallo, MD;  Location: [facility];  Service: Urology;  Laterality: N/A;  1 HR [phone] BLUE MEDICARE-[id number]     SOCIAL HISTORY:  Social History   Socioeconomic History  . Marital status: Married    Spouse name: Not on file  . Number of children: Not on file  . Years of education: Not on file  . Highest education level: Not on file  Occupational History  . Not on file  Social Needs  . Financial resource strain: Not on file  . Food insecurity:    Worry: Not on file    Inability: Not on file  . Transportation needs:    Medical: Not on file    Non-medical: Not on file  Tobacco Use  . Smoking status: Current Every Day Smoker    Packs/day: 2.00    Years: 50.00    Pack years: 100.00    Types: Cigarettes  . Smokeless tobacco: Never Used  Substance and Sexual Activity  . Alcohol use: No    Comment: occasionally  . Drug use: No  . Sexual activity: Never    Birth control/protection: None  Lifestyle  . Physical activity:    Days per week: Not on file    Minutes per session: Not on file  .  Stress: Not on file  Relationships  . Social connections:    Talks on phone: Not on file    Gets together: Not on file    Attends religious service: Not on file    Active member of club or organization: Not on file    Attends meetings of clubs or organizations: Not on file    Relationship status: Not on file  . Intimate partner violence:    Fear of current or ex partner: Not on file    Emotionally abused: Not on file    Physically abused: Not on file    Forced sexual activity: Not on file  Other Topics Concern  . Not on file  Social History Narrative  . Not on file    FAMILY HISTORY:  Family History  Problem Relation Age of Onset  . Hypertension Mother   . Asthma Father   . Emphysema Father  CURRENT MEDICATIONS:  Outpatient Encounter Medications as of 05/19/2018  Medication Sig  . amLODipine (NORVASC) 5 MG tablet Take 5 mg by mouth daily.   Huey Bienenstock (TECENTRIQ IV) Inject into the vein. Every 21 days  . carvedilol (COREG) 25 MG tablet Take 25 mg by mouth 2 (two) times daily.   . cetirizine (ZYRTEC) 10 MG tablet Take 10 mg by mouth daily.  . citalopram (CELEXA) 20 MG tablet Take 20 mg by mouth at bedtime.   . fluticasone (FLONASE) 50 MCG/ACT nasal spray Place 2 sprays into both nostrils daily as needed for allergies or rhinitis.  Marland Kitchen oxybutynin (DITROPAN) 5 MG tablet Take 1 tablet (5 mg total) by mouth every 6 (six) hours as needed for bladder spasms.  . prochlorperazine (COMPAZINE) 10 MG tablet Take 1 tablet (10 mg total) by mouth every 6 (six) hours as needed for nausea or vomiting.  . VENTOLIN HFA 108 (90 Base) MCG/ACT inhaler   . acetaminophen (TYLENOL) 500 MG tablet Take 500 mg by mouth as needed (PRN for pain).  . ondansetron (ZOFRAN-ODT) 4 MG disintegrating tablet Take 1 tablet (4 mg total) by mouth every 8 (eight) hours as needed for nausea or vomiting. (Patient not taking: Reported on 04/20/2018)  . oxyCODONE (ROXICODONE) 5 MG immediate release tablet Take 1  tablet (5 mg total) by mouth every 4 (four) hours as needed for severe pain. (Patient not taking: Reported on 04/20/2018)  . [DISCONTINUED] Diphenhyd-Hydrocort-Nystatin (FIRST-DUKES MOUTHWASH) SUSP Use as directed 5 mLs in the mouth or throat 4 (four) times daily -  before meals and at bedtime. (Patient not taking: Reported on 03/08/2018)  . [DISCONTINUED] lidocaine (XYLOCAINE) 2 % solution Swish and swallow 47m three to four times a day as needed for throat pain. (Patient not taking: Reported on 03/08/2018)   No facility-administered encounter medications on file as of 05/19/2018.     ALLERGIES:  Allergies  Allergen Reactions  . Carboplatin Shortness Of Breath    Reaction occurred after patient left [facility]. Pt reported extreme sob after leaving [facility] that eventually resolved on its own.     PHYSICAL EXAM:  ECOG Performance status: 1  Vitals:   05/19/18 0955  BP: (!) 105/54  Pulse: 60  Resp: 20  Temp: 97.7 F (36.5 C)  SpO2: 90%   Filed Weights   05/19/18 0955  Weight: 168 lb 1.6 oz (76.2 kg)    Physical Exam HEENT: No mucositis or thrush. Extremities: 1+ edema bilaterally.  LABORATORY DATA:  I have reviewed the labs as listed.  CBC    Component Value Date/Time   WBC 6.5 05/19/2018 0911   RBC 2.73 (L) 05/19/2018 0911   HGB 8.3 (L) 05/19/2018 0911   HCT 26.2 (L) 05/19/2018 0911   PLT 229 05/19/2018 0911   MCV 96.0 05/19/2018 0911   MCH 30.4 05/19/2018 0911   MCHC 31.7 05/19/2018 0911   RDW 15.6 (H) 05/19/2018 0911   LYMPHSABS 0.9 05/19/2018 0911   MONOABS 0.7 05/19/2018 0911   EOSABS 0.0 05/19/2018 0911   BASOSABS 0.0 05/19/2018 0911   CMP Latest Ref Rng & Units 05/19/2018 04/28/2018 04/20/2018  Glucose 65 - 99 mg/dL 99 107(H) 115(H)  BUN 6 - 20 mg/dL 54(H) 43(H) 35(H)  Creatinine 0.61 - 1.24 mg/dL 3.97(H) 3.14(H) 2.90(H)  Sodium 135 - 145 mmol/L 140 138 136  Potassium 3.5 - 5.1 mmol/L 4.6 4.1 4.6  Chloride 101 - 111 mmol/L 107 108 105  CO2 22 - 32 mmol/L 22  23 25  Calcium 8.9 - 10.3 mg/dL 7.9(L) 8.4(L) 8.3(L)  Total Protein 6.5 - 8.1 g/dL 6.9 6.6 6.6  Total Bilirubin 0.3 - 1.2 mg/dL 0.3 0.3 0.3  Alkaline Phos 38 - 126 U/L 59 59 57  AST 15 - 41 U/L 15 13(L) 14(L)  ALT 17 - 63 U/L 8(L) 7(L) 8(L)       ASSESSMENT & PLAN:   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  Stage IV bladder Murphy with lung metastasis (DX 06/29/2017): Foundation 1 shows MS-stable, TMB-intermediate,HRAS-G12S (clinical trails with binimetinib, cobimetinib, trametinib) -Status post 6 cycles of carboplatin and gemcitabine completed on 11/02/2017 -PET/CT scan on 11/18/2017 showing significant partial metabolic response -Maintenance gemcitabine day 1 and day 15 every 28 days started on 12/03/2017, PET CT scan showing worsening of pulmonary metastatic disease -3 cycles of Keytruda from 03/09/2018 through 04/28/2018 - CT scan of the chest on 04/20/2018 which showed no evidence of pneumonitis.  Left upper lobe cavitary nodule has slightly increased in size measuring 1.8 x 2.3 cm.  This was measuring 1.6 x 1.7 cm on previous PET scan on 03/01/2018.  Right upper lobe nodule was stable.  Right middle lobe lung nodule increased to 1.4 from 1 cm.  This was done for increased shortness of breath, which improved with inhalers prescribed by his PMD.  I did not think the findings on the CT scan represented progression as the increase in size was too small and a CT with contrast was compared to PET/CT without contrast. - He underwent TURBT on 04/01/2018, biopsy was consistent with invasive high-grade papillary urothelial carcinoma. -I have discussed the results of the foundation 1 testing.  Unfortunately he does not have any actionable mutations. -He will proceed with cycle 4 of Keytruda today.  I will arrange CT scans prior to next visit.  2.  CKD: Creatinine went up to 3.9 today.  He underwent bilateral percutaneous nephrostomy tube placement on 03/25/2018.  He underwent TURBT on 04/01/2018.   Right nephrostomy tube is not putting out much.  Left nephrostomy tube is draining well.  He has tube changes scheduled tomorrow by IR [facility].  3.  Anemia: He is requiring intermittent transfusions.  I will give him Feraheme infusion at each visit x2.  4.  Weight loss: -He is able to eat well as he has developed taste for food.  His weight has gone up by 8 pounds.  Part of this could be fluid retention.  He is drinking 1 Ensure a week.      Orders placed this encounter:  No orders of the defined types were placed in this encounter.     Derek Jack, MD [place] [phone]

## 2018-05-19 NOTE — Progress Notes (Signed)
Tolerated infusions w/o adverse reaction.  Alert, in no distress.  VSS.  Discharged via wheelchair in c/o family.  

## 2018-05-20 ENCOUNTER — Encounter (HOSPITAL_COMMUNITY): Payer: Self-pay | Admitting: Diagnostic Radiology

## 2018-05-20 ENCOUNTER — Other Ambulatory Visit (HOSPITAL_COMMUNITY): Payer: Self-pay | Admitting: Interventional Radiology

## 2018-05-20 ENCOUNTER — Ambulatory Visit (HOSPITAL_COMMUNITY)
Admission: RE | Admit: 2018-05-20 | Discharge: 2018-05-20 | Disposition: A | Discharge status: Home / Self Care | Payer: Medicare Other | Source: Ambulatory Visit | Attending: Interventional Radiology | Admitting: Interventional Radiology

## 2018-05-20 ENCOUNTER — Other Ambulatory Visit (HOSPITAL_COMMUNITY): Payer: Self-pay | Admitting: Diagnostic Radiology

## 2018-05-20 DIAGNOSIS — N1330 Unspecified hydronephrosis: Secondary | ICD-10-CM

## 2018-05-20 DIAGNOSIS — C679 Malignant neoplasm of bladder, unspecified: Secondary | ICD-10-CM | POA: Insufficient documentation

## 2018-05-20 DIAGNOSIS — Z436 Encounter for attention to other artificial openings of urinary tract: Secondary | ICD-10-CM | POA: Diagnosis not present

## 2018-05-20 HISTORY — PX: IR NEPHROSTOMY EXCHANGE LEFT: IMG6069

## 2018-05-20 HISTORY — PX: IR NEPHROSTOMY EXCHANGE RIGHT: IMG6070

## 2018-05-20 MED ORDER — IOPAMIDOL (ISOVUE-300) INJECTION 61%
50.0000 mL | Freq: Once | INTRAVENOUS | Status: AC | PRN
  Administered 2018-05-20: 15 mL

## 2018-05-20 MED ORDER — IOPAMIDOL (ISOVUE-300) INJECTION 61%
INTRAVENOUS | Status: AC
  Administered 2018-05-20: 15 mL
  Filled 2018-05-20: qty 50

## 2018-05-20 MED ORDER — LIDOCAINE HCL 1 % IJ SOLN
INTRAMUSCULAR | Status: AC | PRN
  Administered 2018-05-20: 10 mL

## 2018-05-20 MED ORDER — LIDOCAINE HCL 1 % IJ SOLN
INTRAMUSCULAR | Status: AC
  Filled 2018-05-20: qty 20

## 2018-05-21 ENCOUNTER — Encounter (HOSPITAL_COMMUNITY): Payer: Self-pay | Admitting: Hematology

## 2018-06-07 ENCOUNTER — Inpatient Hospital Stay (HOSPITAL_COMMUNITY): Payer: Medicare Other | Attending: Hematology

## 2018-06-07 ENCOUNTER — Ambulatory Visit (HOSPITAL_COMMUNITY)
Admission: RE | Admit: 2018-06-07 | Discharge: 2018-06-07 | Disposition: A | Discharge status: Home / Self Care | Payer: Medicare Other | Source: Ambulatory Visit | Attending: Hematology | Admitting: Hematology

## 2018-06-07 DIAGNOSIS — C7802 Secondary malignant neoplasm of left lung: Secondary | ICD-10-CM | POA: Diagnosis not present

## 2018-06-07 DIAGNOSIS — Z936 Other artificial openings of urinary tract status: Secondary | ICD-10-CM | POA: Insufficient documentation

## 2018-06-07 DIAGNOSIS — I313 Pericardial effusion (noninflammatory): Secondary | ICD-10-CM | POA: Insufficient documentation

## 2018-06-07 DIAGNOSIS — J432 Centrilobular emphysema: Secondary | ICD-10-CM | POA: Diagnosis not present

## 2018-06-07 DIAGNOSIS — C7801 Secondary malignant neoplasm of right lung: Secondary | ICD-10-CM | POA: Diagnosis not present

## 2018-06-07 DIAGNOSIS — M799 Soft tissue disorder, unspecified: Secondary | ICD-10-CM | POA: Insufficient documentation

## 2018-06-07 DIAGNOSIS — I700 Atherosclerosis of aorta: Secondary | ICD-10-CM | POA: Insufficient documentation

## 2018-06-07 DIAGNOSIS — D649 Anemia, unspecified: Secondary | ICD-10-CM | POA: Diagnosis not present

## 2018-06-07 DIAGNOSIS — C7800 Secondary malignant neoplasm of unspecified lung: Secondary | ICD-10-CM | POA: Diagnosis not present

## 2018-06-07 DIAGNOSIS — Z720 Tobacco use: Secondary | ICD-10-CM | POA: Diagnosis not present

## 2018-06-07 DIAGNOSIS — C670 Malignant neoplasm of trigone of bladder: Secondary | ICD-10-CM | POA: Diagnosis present

## 2018-06-07 DIAGNOSIS — N189 Chronic kidney disease, unspecified: Secondary | ICD-10-CM | POA: Diagnosis not present

## 2018-06-07 DIAGNOSIS — Z5111 Encounter for antineoplastic chemotherapy: Secondary | ICD-10-CM | POA: Diagnosis not present

## 2018-06-07 DIAGNOSIS — R5383 Other fatigue: Secondary | ICD-10-CM | POA: Diagnosis not present

## 2018-06-07 DIAGNOSIS — R200 Anesthesia of skin: Secondary | ICD-10-CM | POA: Diagnosis not present

## 2018-06-07 LAB — CBC WITH DIFFERENTIAL/PLATELET
BASOS ABS: 0 10*3/uL (ref 0.0–0.1)
Basophils Relative: 0 %
EOS ABS: 0 10*3/uL (ref 0.0–0.7)
EOS PCT: 0 %
HCT: 28 % — ABNORMAL LOW (ref 39.0–52.0)
Hemoglobin: 8.8 g/dL — ABNORMAL LOW (ref 13.0–17.0)
LYMPHS ABS: 0.7 10*3/uL (ref 0.7–4.0)
LYMPHS PCT: 12 %
MCH: 31.1 pg (ref 26.0–34.0)
MCHC: 31.4 g/dL (ref 30.0–36.0)
MCV: 98.9 fL (ref 78.0–100.0)
MONO ABS: 0.5 10*3/uL (ref 0.1–1.0)
Monocytes Relative: 9 %
Neutro Abs: 4.4 10*3/uL (ref 1.7–7.7)
Neutrophils Relative %: 79 %
PLATELETS: 218 10*3/uL (ref 150–400)
RBC: 2.83 MIL/uL — AB (ref 4.22–5.81)
RDW: 17.2 % — AB (ref 11.5–15.5)
WBC: 5.6 10*3/uL (ref 4.0–10.5)

## 2018-06-07 LAB — IRON AND TIBC
IRON: 69 ug/dL (ref 45–182)
SATURATION RATIOS: 24 % (ref 17.9–39.5)
TIBC: 290 ug/dL (ref 250–450)
UIBC: 221 ug/dL

## 2018-06-07 LAB — FERRITIN: Ferritin: 277 ng/mL (ref 24–336)

## 2018-06-07 LAB — COMPREHENSIVE METABOLIC PANEL
ALT: 8 U/L (ref 0–44)
AST: 14 U/L — ABNORMAL LOW (ref 15–41)
Albumin: 3.1 g/dL — ABNORMAL LOW (ref 3.5–5.0)
Alkaline Phosphatase: 50 U/L (ref 38–126)
Anion gap: 7 (ref 5–15)
BUN: 52 mg/dL — ABNORMAL HIGH (ref 8–23)
CHLORIDE: 113 mmol/L — AB (ref 98–111)
CO2: 21 mmol/L — AB (ref 22–32)
Calcium: 8.2 mg/dL — ABNORMAL LOW (ref 8.9–10.3)
Creatinine, Ser: 4.01 mg/dL — ABNORMAL HIGH (ref 0.61–1.24)
GFR calc Af Amer: 16 mL/min — ABNORMAL LOW (ref >60)
GFR calc non Af Amer: 14 mL/min — ABNORMAL LOW (ref >60)
Glucose, Bld: 93 mg/dL (ref 70–99)
POTASSIUM: 5.9 mmol/L — AB (ref 3.5–5.1)
SODIUM: 141 mmol/L (ref 135–145)
Total Bilirubin: 0.4 mg/dL (ref 0.3–1.2)
Total Protein: 7 g/dL (ref 6.5–8.1)

## 2018-06-07 LAB — FOLATE: Folate: 5.2 ng/mL — ABNORMAL LOW (ref >5.9)

## 2018-06-07 LAB — VITAMIN B12: VITAMIN B 12: 277 pg/mL (ref 180–914)

## 2018-06-08 ENCOUNTER — Other Ambulatory Visit (HOSPITAL_COMMUNITY): Payer: Self-pay

## 2018-06-08 DIAGNOSIS — N184 Chronic kidney disease, stage 4 (severe): Secondary | ICD-10-CM

## 2018-06-08 DIAGNOSIS — C670 Malignant neoplasm of trigone of bladder: Secondary | ICD-10-CM

## 2018-06-09 ENCOUNTER — Inpatient Hospital Stay (HOSPITAL_BASED_OUTPATIENT_CLINIC_OR_DEPARTMENT_OTHER): Payer: Medicare Other | Admitting: Hematology

## 2018-06-09 ENCOUNTER — Encounter (HOSPITAL_COMMUNITY): Payer: Self-pay | Admitting: Hematology

## 2018-06-09 ENCOUNTER — Other Ambulatory Visit (HOSPITAL_COMMUNITY): Payer: Self-pay

## 2018-06-09 ENCOUNTER — Inpatient Hospital Stay (HOSPITAL_COMMUNITY): Payer: Medicare Other

## 2018-06-09 ENCOUNTER — Other Ambulatory Visit: Payer: Self-pay

## 2018-06-09 VITALS — BP 115/53 | HR 55 | Temp 97.6°F | Resp 20 | Wt 171.0 lb

## 2018-06-09 DIAGNOSIS — Z9289 Personal history of other medical treatment: Secondary | ICD-10-CM

## 2018-06-09 DIAGNOSIS — C670 Malignant neoplasm of trigone of bladder: Secondary | ICD-10-CM

## 2018-06-09 DIAGNOSIS — D649 Anemia, unspecified: Secondary | ICD-10-CM

## 2018-06-09 DIAGNOSIS — R200 Anesthesia of skin: Secondary | ICD-10-CM | POA: Diagnosis not present

## 2018-06-09 DIAGNOSIS — N189 Chronic kidney disease, unspecified: Secondary | ICD-10-CM

## 2018-06-09 DIAGNOSIS — Z720 Tobacco use: Secondary | ICD-10-CM

## 2018-06-09 DIAGNOSIS — N184 Chronic kidney disease, stage 4 (severe): Secondary | ICD-10-CM

## 2018-06-09 DIAGNOSIS — R5383 Other fatigue: Secondary | ICD-10-CM | POA: Diagnosis not present

## 2018-06-09 DIAGNOSIS — C7800 Secondary malignant neoplasm of unspecified lung: Secondary | ICD-10-CM

## 2018-06-09 DIAGNOSIS — Z5111 Encounter for antineoplastic chemotherapy: Secondary | ICD-10-CM | POA: Diagnosis not present

## 2018-06-09 DIAGNOSIS — Z936 Other artificial openings of urinary tract status: Secondary | ICD-10-CM | POA: Diagnosis not present

## 2018-06-09 LAB — CBC WITH DIFFERENTIAL/PLATELET
BASOS PCT: 0 %
Basophils Absolute: 0 10*3/uL (ref 0.0–0.1)
Eosinophils Absolute: 0 10*3/uL (ref 0.0–0.7)
Eosinophils Relative: 0 %
HEMATOCRIT: 27.8 % — AB (ref 39.0–52.0)
HEMOGLOBIN: 8.8 g/dL — AB (ref 13.0–17.0)
Lymphocytes Relative: 12 %
Lymphs Abs: 0.7 10*3/uL (ref 0.7–4.0)
MCH: 31 pg (ref 26.0–34.0)
MCHC: 31.7 g/dL (ref 30.0–36.0)
MCV: 97.9 fL (ref 78.0–100.0)
MONOS PCT: 6 %
Monocytes Absolute: 0.4 10*3/uL (ref 0.1–1.0)
NEUTROS ABS: 4.7 10*3/uL (ref 1.7–7.7)
Neutrophils Relative %: 82 %
Platelets: 227 10*3/uL (ref 150–400)
RBC: 2.84 MIL/uL — AB (ref 4.22–5.81)
RDW: 17.1 % — ABNORMAL HIGH (ref 11.5–15.5)
WBC: 5.8 10*3/uL (ref 4.0–10.5)

## 2018-06-09 LAB — COMPREHENSIVE METABOLIC PANEL
ALBUMIN: 3.1 g/dL — AB (ref 3.5–5.0)
ALK PHOS: 53 U/L (ref 38–126)
ALT: 6 U/L (ref 0–44)
AST: 15 U/L (ref 15–41)
Anion gap: 9 (ref 5–15)
BILIRUBIN TOTAL: 0.3 mg/dL (ref 0.3–1.2)
BUN: 54 mg/dL — ABNORMAL HIGH (ref 8–23)
CALCIUM: 7.9 mg/dL — AB (ref 8.9–10.3)
CO2: 19 mmol/L — AB (ref 22–32)
CREATININE: 4.48 mg/dL — AB (ref 0.61–1.24)
Chloride: 110 mmol/L (ref 98–111)
GFR calc Af Amer: 14 mL/min — ABNORMAL LOW (ref >60)
GFR calc non Af Amer: 12 mL/min — ABNORMAL LOW (ref >60)
GLUCOSE: 132 mg/dL — AB (ref 70–99)
Potassium: 5.1 mmol/L (ref 3.5–5.1)
Sodium: 138 mmol/L (ref 135–145)
TOTAL PROTEIN: 7 g/dL (ref 6.5–8.1)

## 2018-06-09 LAB — SAMPLE TO BLOOD BANK

## 2018-06-09 NOTE — Progress Notes (Signed)
Pt seen by Dr. [name] today. New chemo medication to be started when authorization is received so pt to return this Friday for [name] and Feraheme. Pt verbalized understanding and discharged via wheelchair in stable condition accompanied by his daughter

## 2018-06-09 NOTE — Assessment & Plan Note (Signed)
1.  Stage IV bladder cancer with lung metastasis (DX 06/29/2017): Foundation 1 shows MS-stable, TMB-intermediate,HRAS-G12S (clinical trails with binimetinib, cobimetinib, trametinib) - Carboplatin and radiation therapy from 07/22/2016 through 09/08/2016 for local disease -Status post 6 cycles of carboplatin and gemcitabine completed on 11/02/2017 after development of metastatic disease -PET/CT scan on 11/18/2017 showing significant partial metabolic response -Maintenance gemcitabine day 1 and day 15 every 28 days started on 12/03/2017, PET CT scan showing worsening of pulmonary metastatic disease - CT scan of the chest on 04/20/2018 which showed no evidence of pneumonitis.  Left upper lobe cavitary nodule has slightly increased in size measuring 1.8 x 2.3 cm.  This was measuring 1.6 x 1.7 cm on previous PET scan on 03/01/2018.  Right upper lobe nodule was stable.  Right middle lobe lung nodule increased to 1.4 from 1 cm.  This was done for increased shortness of breath, which improved with inhalers prescribed by his PMD.  I did not think the findings on the CT scan represented progression as the increase in size was too small and a CT with contrast was compared to PET/CT without contrast. - He underwent TURBT on 04/01/2018, biopsy was consistent with invasive high-grade papillary urothelial carcinoma. - 4 cycles of Keytruda from 03/09/2018 through 05/19/2018. -I reviewed the results of the CT scan of the chest, abdomen and pelvis dated 06/07/2018 which showed increase in largest of the lung metastasis by 1 cm and several other lesions by 5 to 7 mm. -Hence I have recommended changing his treatment at this time.  We will discontinue Keytruda.  We will start him on [name] at 260 mg/m every 3 weeks with [name] support.  We will obtain authorization from his insurance.  We discussed the side effects in detail.  He will be scheduled for his treatment later this week.  2.  CKD:He underwent bilateral percutaneous  nephrostomy tube placement on 03/25/2018.  He underwent TURBT on 04/01/2018.  Right nephrostomy tube is not putting out much.  Left nephrostomy tube is draining well.  He had nephrostomy tubes changed recently.  3.  Anemia: He is requiring intermittent transfusions.  I have given Feraheme first infusion on 05/19/2018.  We will give second infusion with his chemotherapy.  4.  Weight loss: -He is able to eat well as he has developed taste for food.  His weight has gone up by 8 pounds.  Part of this could be fluid retention.  He is drinking 1 Ensure a week.

## 2018-06-09 NOTE — Progress Notes (Signed)
Carl Murphy, [place] [address]   CLINIC:  Medical Oncology/Hematology  PCP:  [name], [place] [address] [phone]   REASON FOR VISIT:  Follow-up for metastatic bladder cancer  CURRENT THERAPY: Keytruda every 3 weeks  BRIEF ONCOLOGIC HISTORY:  Oncology History   Per available records had bladder tumor removed but had extension into the muscularis propria. CT showed no extension beyond the bladder. Has seen 2 urologists in consultation. Adamant about not having a cystectomy and ileal conduit.     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 No findings for local spread of disease or metastatic disease      03/25/2016 Surgery    TURBT 3cm right trigonal bladder tumor. followed by intravesical epirubicin instillation      03/25/2016 Pathology Results    bladder TURBT infiltrative high grade papillary urothelial carcinoma, invades the mulcularis propria (detrusor) no LVI       04/14/2016 Miscellaneous    [name] consultation in [place] for consideration of cystectomy. patient also offered repeat resection with BCG, and CHEMO/XRT      06/17/2016 Pathology Results    TURB- high grade papillary urothelial carcinoma with focal stromal invasion       Radiation Therapy    Planned to be completed on 09/10/2016.      07/22/2016 -  Chemotherapy    The patient had palonosetron (ALOXI) injection 0.25 mg, 0.25 mg, Intravenous,  Once, 2 of 5 cycles Administration: 0.25 mg (07/28/2016)  CARBOplatin (PARAPLATIN) 160 mg in sodium chloride 0.9 % 100 mL chemo infusion, 160 mg (100 % of original dose 160.6 mg), Intravenous,  Once, 2 of 5 cycles Dose modification:   (original dose 160.6 mg, Cycle 1),   (original dose 160.6 mg, Cycle 1),   (original dose 160.6 mg, Cycle 1),   (original dose 160.6 mg, Cycle  2) Administration: 180 mg (07/28/2016)  for chemotherapy treatment.        04/07/2017 Relapse/Recurrence    CT Abd/pelvis: IMPRESSION: 1. Recurrent bladder carcinoma with synchronous disease in the distal right ureter causing severe right hydronephrosis and decreased right renal function. 2. Bilateral renal stones. 3. Aortic atherosclerosis (ICD10-170.0). Coronary artery calcification. 4. Small periumbilical hernia contains a knuckle of unobstructed small bowel.       06/10/2017 PET scan    1. Metachronous urothelial carcinoma within the distal right ureter, as on prior CT. 2. No evidence of nodal metastasis. 3. Bilateral hypermetabolic pulmonary nodules, favoring pulmonary metastasis. Especially given emphysema, 1 or more primary bronchogenic carcinomas cannot be excluded but are felt less likely. 4. Coronary artery atherosclerosis. Aortic Atherosclerosis (ICD10-I70.0). 5. Bladder not well evaluated secondary to hypermetabolic urine within.       06/18/2017 Procedure    Placement of single lumen port a cath via right internal jugular vein. The catheter tip lies at the cavo-atrial junction. A power injectable port a cath was placed and is ready for immediate use.      06/29/2017 -  Chemotherapy    Carboplatin/Gemcitabine every 21 days       08/14/2017 Imaging    CT C/A/P IMPRESSION: 1. Overall stable pulmonary nodules. Knee soft tissue rim around the cavitary lesion in the left upper lobe is slightly thinner. No new pulmonary nodules or progressive findings in the chest. 2. Stable advanced emphysematous changes and pulmonary  scarring. 3. No mediastinal or hilar mass or lymphadenopathy. 4. Improved CT appearance of the bladder. The asymmetric areas of bladder wall enhancement have improved. There is still diffuse irregular bladder wall thickening and distal right ureteral tumor appears stable. Stable right-sided hydroureteronephrosis. 5. Stable cirrhotic changes  involving the liver and stable liver cysts. 6. Stable scattered mesenteric and retroperitoneal and pelvic lymph nodes but no mass or overt adenopathy. 7. Stable severe/advanced atherosclerotic calcifications involving the thoracic and abdominal aorta and branch vessels.      11/18/2017 Imaging    PET CT: IMPRESSION: 1. Significant partial metabolic response. Bilateral mildly hypermetabolic pulmonary metastases are decreased in size and metabolism. No new or progressive hypermetabolic metastatic disease. 2. Stable hypermetabolic distal right ureteral tumor with marked right hydroureteronephrosis. 3. Chronic findings include: Aortic Atherosclerosis (ICD10-I70.0) and Emphysema (ICD10-J43.9). Three-vessel coronary atherosclerosis. Stable small fat containing umbilical hernia.      03/02/2018 Imaging    Whole body PET CT scan  IMPRESSION: 1. Progressive pulmonary metastatic disease with enlarging and progressively hypermetabolic pulmonary nodules as described above. 2. New nodularity at the left lung base is hypermetabolic but could be an inflammatory or infectious process. 3. Stable hypermetabolic tumor in the right distal ureter. 4. No evidence of metastatic disease involving the abdomen/pelvis or osseous structures. 5. Chronic bilateral hydroureteronephrosis and asymmetric bladder wall thickening. 6. Stable severe atherosclerotic disease      03/08/2018 -  Chemotherapy    The patient had pembrolizumab (KEYTRUDA) 200 mg in sodium chloride 0.9 % 50 mL chemo infusion, 200 mg, Intravenous, Once, 4 of 4 cycles Administration: 200 mg (03/09/2018), 200 mg (03/30/2018), 200 mg (04/28/2018), 200 mg (05/19/2018)  for chemotherapy treatment.         CANCER STAGING: Cancer Staging Malignant neoplasm of trigone of bladder ([employer]) Staging form: Urinary Bladder, AJCC 7th Edition - Clinical stage from 05/15/2016: Stage II (T2, N0, M0) - Signed by Baird Cancer, PA-C on 08/11/2016 - Pathologic  stage from 06/29/2017: Stage IV (T2a, N0, M1) - Signed by Baird Cancer, PA-C on 06/29/2017    INTERVAL HISTORY:  Carl Murphy 72 y.o. male returns for routine follow-up for metastatic bladder cancer. Patient is here today with his daughter. We discussed his CT results today and discussed his treatment plan. Patient is feeling fatigued today. Appetite is about 50% and is improved since last visit. He is still drinking 1 can of ensure a week. His tubes were changed last week and are draining very little. Patient has slight tingling in his hands and has problems buttoning his shirt with his fingers. Denies numbness or tingling in his feet. Denies nausea,vomiting, or diarrhea. Denies any new pains. Denies any recent fevers or infections.    REVIEW OF SYSTEMS:  Review of Systems  Constitutional: Positive for fatigue.  HENT:  Negative.   Eyes: Negative.   Respiratory: Positive for cough.   Cardiovascular: Negative.   Gastrointestinal: Negative.   Endocrine: Negative.   Genitourinary: Negative.    Musculoskeletal: Negative.   Skin: Positive for itching.  Neurological: Negative.   Hematological: Bruises/bleeds easily.  Psychiatric/Behavioral: Positive for sleep disturbance.     PAST MEDICAL/SURGICAL HISTORY:  Past Medical History:  Diagnosis Date  . Arthritis   . Cancer [facility])    Bladder Cancer  . CHF (congestive heart failure) ([place])   . COPD (chronic obstructive pulmonary disease) ([place])   . Depression   . Hard of hearing    bilat   . Hypertension   . Malignant neoplasm  of trigone of bladder ([place]) 03/25/2016  . PONV (postoperative nausea and vomiting)   . Shortness of breath dyspnea    heat; walking up set of stairs; pt states can walk up 12-14 stairs slowly without having to stop to catch his breath   Past Surgical History:  Procedure Laterality Date  . CYSTOSCOPY N/A 06/16/2016   Procedure: CYSTOSCOPY;  Surgeon: Franchot Gallo, MD;  Location: [facility];  Service: Urology;   Laterality: N/A;  . IR FLUORO GUIDE PORT INSERTION RIGHT  06/18/2017  . IR NEPHROSTOMY EXCHANGE LEFT  05/20/2018  . IR NEPHROSTOMY EXCHANGE RIGHT  05/20/2018  . IR NEPHROSTOMY PLACEMENT LEFT  03/25/2018  . IR NEPHROSTOMY PLACEMENT RIGHT  03/25/2018  . IR US GUIDE VASC ACCESS RIGHT  06/18/2017  . TRANSURETHRAL RESECTION OF BLADDER TUMOR N/A 03/25/2016   Procedure: TRANSURETHRAL RESECTION OF BLADDER TUMOR (TURBT);  Surgeon: Franchot Gallo, MD;  Location: [facility];  Service: Urology;  Laterality: N/A;  . TRANSURETHRAL RESECTION OF BLADDER TUMOR N/A 06/16/2016   Procedure: TRANSURETHRAL RESECTION OF BLADDER TUMOR (TURBT);  Surgeon: Franchot Gallo, MD;  Location: [facility];  Service: Urology;  Laterality: N/A;  . TRANSURETHRAL RESECTION OF BLADDER TUMOR N/A 12/16/2016   Procedure: TRANSURETHRAL RESECTION OF BLADDER TUMOR (TURBT);  Surgeon: Franchot Gallo, MD;  Location: [facility];  Service: Urology;  Laterality: N/A;  . TRANSURETHRAL RESECTION OF BLADDER TUMOR N/A 05/11/2017   Procedure: TRANSURETHRAL RESECTION OF BLADDER TUMOR (TURBT) CLOT EVACUATION;  Surgeon: Franchot Gallo, MD;  Location: [facility];  Service: Urology;  Laterality: N/A;  [place] BLUE MEDICARE-[id number]  . TRANSURETHRAL RESECTION OF BLADDER TUMOR N/A 04/01/2018   Procedure: TRANSURETHRAL RESECTION OF BLADDER TUMOR (TURBT);  Surgeon: Franchot Gallo, MD;  Location: [facility];  Service: Urology;  Laterality: N/A;  1 HR [id number] BLUE MEDICARE-[id number]     SOCIAL HISTORY:  Social History   Socioeconomic History  . Marital status: Married    Spouse name: Not on file  . Number of children: Not on file  . Years of education: Not on file  . Highest education level: Not on file  Occupational History  . Not on file  Social Needs  . Financial resource strain: Not on file  . Food insecurity:    Worry: Not on file    Inability: Not on file  . Transportation needs:    Medical: Not on file    Non-medical: Not  on file  Tobacco Use  . Smoking status: Current Every Day Smoker    Packs/day: 2.00    Years: 50.00    Pack years: 100.00    Types: Cigarettes  . Smokeless tobacco: Never Used  Substance and Sexual Activity  . Alcohol use: No    Comment: occasionally  . Drug use: No  . Sexual activity: Never    Birth control/protection: None  Lifestyle  . Physical activity:    Days per week: Not on file    Minutes per session: Not on file  . Stress: Not on file  Relationships  . Social connections:    Talks on phone: Not on file    Gets together: Not on file    Attends religious service: Not on file    Active member of club or organization: Not on file    Attends meetings of clubs or organizations: Not on file    Relationship status: Not on file  . Intimate partner violence:    Fear of current or ex partner: Not on file    Emotionally  abused: Not on file    Physically abused: Not on file    Forced sexual activity: Not on file  Other Topics Concern  . Not on file  Social History Narrative  . Not on file    FAMILY HISTORY:  Family History  Problem Relation Age of Onset  . Hypertension Mother   . Asthma Father   . Emphysema Father     CURRENT MEDICATIONS:  Outpatient Encounter Medications as of 06/09/2018  Medication Sig  . acetaminophen (TYLENOL) 500 MG tablet Take 500 mg by mouth as needed (PRN for pain).  Marland Kitchen amLODipine (NORVASC) 5 MG tablet Take 5 mg by mouth daily.   Huey Bienenstock (TECENTRIQ IV) Inject into the vein. Every 21 days  . carvedilol (COREG) 25 MG tablet Take 25 mg by mouth 2 (two) times daily.   . cetirizine (ZYRTEC) 10 MG tablet Take 10 mg by mouth daily.  . citalopram (CELEXA) 20 MG tablet Take 20 mg by mouth at bedtime.   . fluticasone (FLONASE) 50 MCG/ACT nasal spray Place 2 sprays into both nostrils daily as needed for allergies or rhinitis.  Marland Kitchen ondansetron (ZOFRAN-ODT) 4 MG disintegrating tablet Take 1 tablet (4 mg total) by mouth every 8 (eight) hours as  needed for nausea or vomiting.  Marland Kitchen oxybutynin (DITROPAN) 5 MG tablet Take 1 tablet (5 mg total) by mouth every 6 (six) hours as needed for bladder spasms.  Marland Kitchen oxyCODONE (ROXICODONE) 5 MG immediate release tablet Take 1 tablet (5 mg total) by mouth every 4 (four) hours as needed for severe pain.  Marland Kitchen prochlorperazine (COMPAZINE) 10 MG tablet Take 1 tablet (10 mg total) by mouth every 6 (six) hours as needed for nausea or vomiting.  Enid Cutter HFA 108 (90 Base) MCG/ACT inhaler    No facility-administered encounter medications on file as of 06/09/2018.     ALLERGIES:  Allergies  Allergen Reactions  . Carboplatin Shortness Of Breath    Reaction occurred after patient left Cancer Center. Pt reported extreme sob after leaving CC that eventually resolved on its own.     PHYSICAL EXAM:  ECOG Performance status: 1  Vitals:   06/09/18 1029  BP: (!) 115/53  Pulse: (!) 55  Resp: 20  Temp: 97.6 F (36.4 C)  SpO2: 94%   Filed Weights   06/09/18 1029  Weight: 171 lb (77.6 kg)    Physical Exam   LABORATORY DATA:  I have reviewed the labs as listed.  CBC    Component Value Date/Time   WBC 5.8 06/09/2018 0943   RBC 2.84 (L) 06/09/2018 0943   HGB 8.8 (L) 06/09/2018 0943   HCT 27.8 (L) 06/09/2018 0943   PLT 227 06/09/2018 0943   MCV 97.9 06/09/2018 0943   MCH 31.0 06/09/2018 0943   MCHC 31.7 06/09/2018 0943   RDW 17.1 (H) 06/09/2018 0943   LYMPHSABS 0.7 06/09/2018 0943   MONOABS 0.4 06/09/2018 0943   EOSABS 0.0 06/09/2018 0943   BASOSABS 0.0 06/09/2018 0943   CMP Latest Ref Rng & Units 06/09/2018 06/07/2018 05/19/2018  Glucose 70 - 99 mg/dL 132(H) 93 99  BUN 8 - 23 mg/dL 54(H) 52(H) 54(H)  Creatinine 0.61 - 1.24 mg/dL 4.48(H) 4.01(H) 3.97(H)  Sodium 135 - 145 mmol/L 138 141 140  Potassium 3.5 - 5.1 mmol/L 5.1 5.9(H) 4.6  Chloride 98 - 111 mmol/L 110 113(H) 107  CO2 22 - 32 mmol/L 19(L) 21(L) 22  Calcium 8.9 - 10.3 mg/dL 7.9(L) 8.2(L) 7.9(L)  Total Protein 6.5 -  8.1 g/dL 7.0 7.0  6.9  Total Bilirubin 0.3 - 1.2 mg/dL 0.3 0.4 0.3  Alkaline Phos 38 - 126 U/L 53 50 59  AST 15 - 41 U/L 15 14(L) 15  ALT 0 - 44 U/L 6 8 8(L)       DIAGNOSTIC IMAGING:  I have independently reviewed the images of his CT scan on 72,019 and discussed with the patient and his daughter.     ASSESSMENT & PLAN:   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  Stage IV bladder cancer with lung metastasis (DX 06/29/2017): Foundation 1 shows MS-stable, TMB-intermediate,HRAS-G12S (clinical trails with binimetinib, cobimetinib, trametinib) - Carboplatin and radiation therapy from 07/22/2016 through 09/08/2016 for local disease -Status post 6 cycles of carboplatin and gemcitabine completed on 11/02/2017 after development of metastatic disease -PET/CT scan on 11/18/2017 showing significant partial metabolic response -Maintenance gemcitabine day 1 and day 15 every 28 days started on 12/03/2017, PET CT scan showing worsening of pulmonary metastatic disease - CT scan of the chest on 04/20/2018 which showed no evidence of pneumonitis.  Left upper lobe cavitary nodule has slightly increased in size measuring 1.8 x 2.3 cm.  This was measuring 1.6 x 1.7 cm on previous PET scan on 03/01/2018.  Right upper lobe nodule was stable.  Right middle lobe lung nodule increased to 1.4 from 1 cm.  This was done for increased shortness of breath, which improved with inhalers prescribed by his PMD.  I did not think the findings on the CT scan represented progression as the increase in size was too small and a CT with contrast was compared to PET/CT without contrast. - He underwent TURBT on 04/01/2018, biopsy was consistent with invasive high-grade papillary urothelial carcinoma. - 4 cycles of Keytruda from 03/09/2018 through 05/19/2018. -I reviewed the results of the CT scan of the chest, abdomen and pelvis dated 06/07/2018 which showed increase in largest of the lung metastasis by 1 cm and several other lesions by 5 to 7 mm. -Hence  I have recommended changing his treatment at this time.  We will discontinue Keytruda.  We will start him on [name] at 260 mg/m every 3 weeks with [name] support.  We will obtain authorization from his insurance.  We discussed the side effects in detail.  He will be scheduled for his treatment later this week.  2.  CKD:He underwent bilateral percutaneous nephrostomy tube placement on 03/25/2018.  He underwent TURBT on 04/01/2018.  Right nephrostomy tube is not putting out much.  Left nephrostomy tube is draining well.  He had nephrostomy tubes changed recently.  3.  Anemia: He is requiring intermittent transfusions.  I have given Feraheme first infusion on 05/19/2018.  We will give second infusion with his chemotherapy.  4.  Weight loss: -He is able to eat well as he has developed taste for food.  His weight has gone up by 8 pounds.  Part of this could be fluid retention.  He is drinking 1 Ensure a week.      Orders placed this encounter:  Orders Placed This Encounter  Procedures  . CBC with Differential/Platelet  . Comprehensive metabolic panel      Derek Jack, MD [place] [phone]

## 2018-06-11 ENCOUNTER — Encounter (HOSPITAL_COMMUNITY): Payer: Self-pay | Admitting: Hematology

## 2018-06-11 ENCOUNTER — Inpatient Hospital Stay (HOSPITAL_COMMUNITY): Payer: Medicare Other

## 2018-06-11 ENCOUNTER — Other Ambulatory Visit: Payer: Self-pay

## 2018-06-11 ENCOUNTER — Encounter (HOSPITAL_COMMUNITY): Payer: Self-pay

## 2018-06-11 VITALS — BP 109/42 | HR 50 | Temp 97.7°F | Resp 18 | Wt 171.6 lb

## 2018-06-11 DIAGNOSIS — Z5111 Encounter for antineoplastic chemotherapy: Secondary | ICD-10-CM | POA: Diagnosis not present

## 2018-06-11 DIAGNOSIS — C670 Malignant neoplasm of trigone of bladder: Secondary | ICD-10-CM

## 2018-06-11 MED ORDER — SODIUM CHLORIDE 0.9 % IV SOLN
Freq: Once | INTRAVENOUS | Status: AC
  Administered 2018-06-11: 12:00:00 via INTRAVENOUS

## 2018-06-11 MED ORDER — SODIUM CHLORIDE 0.9 % IV SOLN: Freq: Once | INTRAVENOUS | Status: DC

## 2018-06-11 MED ORDER — SODIUM CHLORIDE 0.9 % IV SOLN
INTRAVENOUS | Status: DC
  Administered 2018-06-11: 12:00:00 via INTRAVENOUS

## 2018-06-11 MED ORDER — HEPARIN SOD (PORK) LOCK FLUSH 100 UNIT/ML IV SOLN
500.0000 [IU] | Freq: Once | INTRAVENOUS | Status: AC | PRN
  Administered 2018-06-11: 500 [IU]

## 2018-06-11 MED ORDER — ONDANSETRON 8 MG PO TBDP
8.0000 mg | ORAL_TABLET | Freq: Once | ORAL | Status: AC
  Administered 2018-06-11: 8 mg via ORAL
  Filled 2018-06-11: qty 1

## 2018-06-11 MED ORDER — PACLITAXEL PROTEIN-BOUND CHEMO INJECTION 100 MG
75.0000 mg/m2 | Freq: Once | Status: AC
  Administered 2018-06-11: 150 mg via INTRAVENOUS
  Filled 2018-06-11: qty 30

## 2018-06-11 MED ORDER — SODIUM CHLORIDE 0.9 % IV SOLN
510.0000 mg | Freq: Once | INTRAVENOUS | Status: AC
  Administered 2018-06-11: 510 mg via INTRAVENOUS
  Filled 2018-06-11: qty 17

## 2018-06-11 NOTE — Progress Notes (Signed)
Faxed patient records to [phone] for referral to Dr. Lowanda Foster.

## 2018-06-11 NOTE — Progress Notes (Signed)
Labs reviewed with Dr. [name] - okay to tx today per MD.  Orders rec'd for nephrology referral, Feraheme 510 mg IV x 1 dose, and NS 1L over 2 hours.   Tolerated infusions w/o adverse reaction.  Alert, in no distress.  VSS.  Discharged via wheelchair in c/o daughter.

## 2018-06-14 ENCOUNTER — Telehealth (HOSPITAL_COMMUNITY): Payer: Self-pay | Admitting: *Deleted

## 2018-06-14 NOTE — Telephone Encounter (Signed)
Pt's daughter called stating that her father usually has medication at home to take after his treatment. He was not given anything to take at home and she wants to make sure that this is right. Dr. Marthann Schiller last note doesn't say anything about decadron or anything other than [name]. Please advise!

## 2018-06-14 NOTE — Telephone Encounter (Signed)
I spoke with Dr. [name] about this pt and he stated that he took care of this over the weekend.

## 2018-06-18 ENCOUNTER — Inpatient Hospital Stay (HOSPITAL_COMMUNITY): Payer: Medicare Other

## 2018-06-18 ENCOUNTER — Encounter (HOSPITAL_COMMUNITY): Payer: Self-pay

## 2018-06-18 ENCOUNTER — Other Ambulatory Visit: Payer: Self-pay

## 2018-06-18 VITALS — BP 117/48 | HR 50 | Temp 97.5°F | Resp 18 | Ht 69.0 in | Wt 175.0 lb

## 2018-06-18 DIAGNOSIS — Z5111 Encounter for antineoplastic chemotherapy: Secondary | ICD-10-CM | POA: Diagnosis not present

## 2018-06-18 DIAGNOSIS — C670 Malignant neoplasm of trigone of bladder: Secondary | ICD-10-CM

## 2018-06-18 DIAGNOSIS — J441 Chronic obstructive pulmonary disease with (acute) exacerbation: Secondary | ICD-10-CM

## 2018-06-18 DIAGNOSIS — N184 Chronic kidney disease, stage 4 (severe): Secondary | ICD-10-CM

## 2018-06-18 LAB — CBC WITH DIFFERENTIAL/PLATELET
BASOS PCT: 0 %
Basophils Absolute: 0 10*3/uL (ref 0.0–0.1)
Eosinophils Absolute: 0 10*3/uL (ref 0.0–0.7)
Eosinophils Relative: 0 %
HCT: 24 % — ABNORMAL LOW (ref 39.0–52.0)
Hemoglobin: 7.7 g/dL — ABNORMAL LOW (ref 13.0–17.0)
LYMPHS ABS: 0.6 10*3/uL — AB (ref 0.7–4.0)
Lymphocytes Relative: 15 %
MCH: 30.9 pg (ref 26.0–34.0)
MCHC: 32.1 g/dL (ref 30.0–36.0)
MCV: 96.4 fL (ref 78.0–100.0)
MONO ABS: 0.3 10*3/uL (ref 0.1–1.0)
MONOS PCT: 6 %
NEUTROS ABS: 3.3 10*3/uL (ref 1.7–7.7)
Neutrophils Relative %: 79 %
Platelets: 194 10*3/uL (ref 150–400)
RBC: 2.49 MIL/uL — ABNORMAL LOW (ref 4.22–5.81)
RDW: 17.4 % — AB (ref 11.5–15.5)
WBC: 4.2 10*3/uL (ref 4.0–10.5)

## 2018-06-18 LAB — COMPREHENSIVE METABOLIC PANEL
ALBUMIN: 2.9 g/dL — AB (ref 3.5–5.0)
ALT: 7 U/L (ref 0–44)
ANION GAP: 7 (ref 5–15)
AST: 17 U/L (ref 15–41)
Alkaline Phosphatase: 47 U/L (ref 38–126)
BILIRUBIN TOTAL: 0.3 mg/dL (ref 0.3–1.2)
BUN: 46 mg/dL — ABNORMAL HIGH (ref 8–23)
CALCIUM: 8.1 mg/dL — AB (ref 8.9–10.3)
CO2: 19 mmol/L — ABNORMAL LOW (ref 22–32)
Chloride: 112 mmol/L — ABNORMAL HIGH (ref 98–111)
Creatinine, Ser: 3.73 mg/dL — ABNORMAL HIGH (ref 0.61–1.24)
GFR, EST AFRICAN AMERICAN: 18 mL/min — AB (ref >60)
GFR, EST NON AFRICAN AMERICAN: 15 mL/min — AB (ref >60)
Glucose, Bld: 90 mg/dL (ref 70–99)
POTASSIUM: 5.5 mmol/L — AB (ref 3.5–5.1)
Sodium: 138 mmol/L (ref 135–145)
TOTAL PROTEIN: 6.4 g/dL — AB (ref 6.5–8.1)

## 2018-06-18 MED ORDER — SODIUM CHLORIDE 0.9 % IV SOLN
Freq: Once | INTRAVENOUS | Status: AC
  Administered 2018-06-18: 10:00:00 via INTRAVENOUS

## 2018-06-18 MED ORDER — SODIUM CHLORIDE 0.9 % IV SOLN: Freq: Once | INTRAVENOUS | Status: DC

## 2018-06-18 MED ORDER — HEPARIN SOD (PORK) LOCK FLUSH 100 UNIT/ML IV SOLN
500.0000 [IU] | Freq: Once | INTRAVENOUS | Status: AC | PRN
  Administered 2018-06-18: 500 [IU]

## 2018-06-18 MED ORDER — PACLITAXEL PROTEIN-BOUND CHEMO INJECTION 100 MG
75.0000 mg/m2 | Freq: Once | INTRAVENOUS | Status: AC
  Administered 2018-06-18: 150 mg via INTRAVENOUS
  Filled 2018-06-18: qty 30

## 2018-06-18 MED ORDER — ONDANSETRON 8 MG PO TBDP
8.0000 mg | ORAL_TABLET | Freq: Once | ORAL | Status: AC
  Administered 2018-06-18: 8 mg via ORAL
  Filled 2018-06-18: qty 1

## 2018-06-18 MED ORDER — IPRATROPIUM-ALBUTEROL 0.5-2.5 (3) MG/3ML IN SOLN: 3.0000 mL | Freq: Four times a day (QID) | RESPIRATORY_TRACT | 1 refills | Status: AC | PRN

## 2018-06-18 NOTE — Progress Notes (Signed)
Labs reviewed with Dr. Walden Field - she is aware of hgb, K+, and creatinine results.  Orders rec'd for NS 500 ml bolus.  Okay to tx today per MD.  Referral was sent last week to nephrology.  Per pt, he has not been contacted with appt yet.   Tolerated infusion w/o adverse reaction.  Alert, in no distress.  VSS.  Discharged via wheelchair in c/o daughter.

## 2018-06-18 NOTE — Progress Notes (Signed)
Patient has wheezing and states that his inhaler is not helping. Patient states that he doesn't think the medication is actually reaching his lungs. Patient has tried using a spacer as well. Dr. [name] wants patient to receive Duoneb every 6 hours as needed for wheezing. Patient has COPD. Dx code: J44.9. Duoneb called into [place].

## 2018-06-25 ENCOUNTER — Other Ambulatory Visit (HOSPITAL_COMMUNITY): Payer: Self-pay

## 2018-06-25 ENCOUNTER — Other Ambulatory Visit: Payer: Self-pay

## 2018-06-25 ENCOUNTER — Inpatient Hospital Stay (HOSPITAL_COMMUNITY): Payer: Medicare Other

## 2018-06-25 ENCOUNTER — Encounter (HOSPITAL_COMMUNITY): Payer: Self-pay

## 2018-06-25 VITALS — BP 114/60 | HR 47 | Temp 97.7°F | Resp 18 | Wt 172.2 lb

## 2018-06-25 DIAGNOSIS — N184 Chronic kidney disease, stage 4 (severe): Secondary | ICD-10-CM

## 2018-06-25 DIAGNOSIS — C670 Malignant neoplasm of trigone of bladder: Secondary | ICD-10-CM

## 2018-06-25 DIAGNOSIS — Z5111 Encounter for antineoplastic chemotherapy: Secondary | ICD-10-CM | POA: Diagnosis not present

## 2018-06-25 DIAGNOSIS — Z9289 Personal history of other medical treatment: Secondary | ICD-10-CM

## 2018-06-25 LAB — CBC WITH DIFFERENTIAL/PLATELET
Basophils Absolute: 0 10*3/uL (ref 0.0–0.1)
Basophils Relative: 0 %
Eosinophils Absolute: 0 10*3/uL (ref 0.0–0.7)
Eosinophils Relative: 0 %
HCT: 24.2 % — ABNORMAL LOW (ref 39.0–52.0)
Hemoglobin: 7.8 g/dL — ABNORMAL LOW (ref 13.0–17.0)
Lymphocytes Relative: 16 %
Lymphs Abs: 0.5 10*3/uL — ABNORMAL LOW (ref 0.7–4.0)
MCH: 31.5 pg (ref 26.0–34.0)
MCHC: 32.2 g/dL (ref 30.0–36.0)
MCV: 97.6 fL (ref 78.0–100.0)
MONO ABS: 0.4 10*3/uL (ref 0.1–1.0)
Monocytes Relative: 13 %
Neutro Abs: 2.1 10*3/uL (ref 1.7–7.7)
Neutrophils Relative %: 71 %
Platelets: 216 10*3/uL (ref 150–400)
RBC: 2.48 MIL/uL — ABNORMAL LOW (ref 4.22–5.81)
RDW: 18.1 % — ABNORMAL HIGH (ref 11.5–15.5)
WBC: 3 10*3/uL — ABNORMAL LOW (ref 4.0–10.5)

## 2018-06-25 LAB — COMPREHENSIVE METABOLIC PANEL
ALT: 8 U/L (ref 0–44)
AST: 16 U/L (ref 15–41)
Albumin: 2.9 g/dL — ABNORMAL LOW (ref 3.5–5.0)
Alkaline Phosphatase: 44 U/L (ref 38–126)
Anion gap: 9 (ref 5–15)
BUN: 47 mg/dL — ABNORMAL HIGH (ref 8–23)
CALCIUM: 8.1 mg/dL — AB (ref 8.9–10.3)
CO2: 17 mmol/L — ABNORMAL LOW (ref 22–32)
Chloride: 110 mmol/L (ref 98–111)
Creatinine, Ser: 4.63 mg/dL — ABNORMAL HIGH (ref 0.61–1.24)
GFR calc Af Amer: 13 mL/min — ABNORMAL LOW (ref >60)
GFR calc non Af Amer: 12 mL/min — ABNORMAL LOW (ref >60)
Glucose, Bld: 115 mg/dL — ABNORMAL HIGH (ref 70–99)
Potassium: 4.9 mmol/L (ref 3.5–5.1)
Sodium: 136 mmol/L (ref 135–145)
Total Bilirubin: 0.3 mg/dL (ref 0.3–1.2)
Total Protein: 6.6 g/dL (ref 6.5–8.1)

## 2018-06-25 MED ORDER — ONDANSETRON 8 MG PO TBDP
8.0000 mg | ORAL_TABLET | Freq: Once | ORAL | Status: AC
  Administered 2018-06-25: 8 mg via ORAL
  Filled 2018-06-25: qty 1

## 2018-06-25 MED ORDER — PACLITAXEL PROTEIN-BOUND CHEMO INJECTION 100 MG
75.0000 mg/m2 | Freq: Once | Status: AC
  Administered 2018-06-25: 150 mg via INTRAVENOUS
  Filled 2018-06-25: qty 30

## 2018-06-25 MED ORDER — SODIUM CHLORIDE 0.9 % IV SOLN: Freq: Once | INTRAVENOUS | Status: DC

## 2018-06-25 MED ORDER — HEPARIN SOD (PORK) LOCK FLUSH 100 UNIT/ML IV SOLN
500.0000 [IU] | Freq: Once | INTRAVENOUS | Status: AC | PRN
  Administered 2018-06-25: 500 [IU]
  Filled 2018-06-25: qty 5

## 2018-06-25 MED ORDER — SODIUM CHLORIDE 0.9% FLUSH
10.0000 mL | INTRAVENOUS | Status: DC | PRN
  Administered 2018-06-25: 10 mL
  Filled 2018-06-25: qty 10

## 2018-06-25 MED ORDER — SODIUM CHLORIDE 0.9 % IV SOLN
Freq: Once | INTRAVENOUS | Status: AC
  Administered 2018-06-25: 11:00:00 via INTRAVENOUS

## 2018-06-25 NOTE — Progress Notes (Signed)
Reviewed labs with Dr. [name] today. Hemoglobin 7.8. Creatinine 4.63 BUN 47. Proceed with treatment today. Will come back next week for labs and possible blood transfusion.  Treatment given per orders. Patient tolerated it well without problems. Vitals stable and discharged home from clinic via wheelchair.  Follow up as scheduled.

## 2018-06-25 NOTE — Patient Instructions (Signed)
[facility] Discharge Instructions for Patients Receiving Chemotherapy   Beginning January 23rd 2017 lab work for the Cancer Center will be done in the  Main lab at [facility] on 1st floor. If you have a lab appointment with the Cancer Center please come in thru the  Main Entrance and check in at the main information desk   Today you received the following chemotherapy agents   To help prevent nausea and vomiting after your treatment, we encourage you to take your nausea medication     If you develop nausea and vomiting, or diarrhea that is not controlled by your medication, call the clinic.  The clinic phone number is [phone]. Office hours are Monday-Friday 8:30am-5:00pm.  BELOW ARE SYMPTOMS THAT SHOULD BE REPORTED IMMEDIATELY:  *FEVER GREATER THAN 101.0 F  *CHILLS WITH OR WITHOUT FEVER  NAUSEA AND VOMITING THAT IS NOT CONTROLLED WITH YOUR NAUSEA MEDICATION  *UNUSUAL SHORTNESS OF BREATH  *UNUSUAL BRUISING OR BLEEDING  TENDERNESS IN MOUTH AND THROAT WITH OR WITHOUT PRESENCE OF ULCERS  *URINARY PROBLEMS  *BOWEL PROBLEMS  UNUSUAL RASH Items with * indicate a potential emergency and should be followed up as soon as possible. If you have an emergency after office hours please contact your primary care physician or go to the nearest emergency department.  Please call the clinic during office hours if you have any questions or concerns.   You may also contact the Patient Navigator at [phone] should you have any questions or need assistance in obtaining follow up care.      Resources For Cancer Patients and their Caregivers ? American Cancer Society: Can assist with transportation, wigs, general needs, runs Look Good Feel Better.        1-888-227-6333 ? Cancer Care: Provides financial assistance, online support groups, medication/co-pay assistance.  1-800-813-HOPE (4673) ? [facility] Assists [place] cancer  patients and their families through emotional , educational and financial support.  336-427-4357 ? [place] DSS Where to apply for food stamps, Medicaid and utility assistance. 336-342-1394 ? [facility]: Transportation to medical appointments. 336-347-2287 ? Social Security Administration: May apply for disability if have a Stage IV cancer. 336-342-7796 1-800-772-1213 ? [place] Aging, Disability and Transit Services: Assists with nutrition, care and transit needs. 336-349-2343         

## 2018-06-28 ENCOUNTER — Inpatient Hospital Stay (HOSPITAL_COMMUNITY): Payer: Medicare Other

## 2018-06-28 DIAGNOSIS — Z5111 Encounter for antineoplastic chemotherapy: Secondary | ICD-10-CM | POA: Diagnosis not present

## 2018-06-28 DIAGNOSIS — C670 Malignant neoplasm of trigone of bladder: Secondary | ICD-10-CM

## 2018-06-28 DIAGNOSIS — N184 Chronic kidney disease, stage 4 (severe): Secondary | ICD-10-CM

## 2018-06-28 DIAGNOSIS — Z9289 Personal history of other medical treatment: Secondary | ICD-10-CM

## 2018-06-28 LAB — COMPREHENSIVE METABOLIC PANEL
ALBUMIN: 3 g/dL — AB (ref 3.5–5.0)
ALK PHOS: 53 U/L (ref 38–126)
ALT: 8 U/L (ref 0–44)
ANION GAP: 7 (ref 5–15)
AST: 15 U/L (ref 15–41)
BILIRUBIN TOTAL: 0.5 mg/dL (ref 0.3–1.2)
BUN: 46 mg/dL — AB (ref 8–23)
CALCIUM: 7.6 mg/dL — AB (ref 8.9–10.3)
CO2: 17 mmol/L — ABNORMAL LOW (ref 22–32)
CREATININE: 4.49 mg/dL — AB (ref 0.61–1.24)
Chloride: 111 mmol/L (ref 98–111)
GFR calc Af Amer: 14 mL/min — ABNORMAL LOW (ref >60)
GFR calc non Af Amer: 12 mL/min — ABNORMAL LOW (ref >60)
GLUCOSE: 89 mg/dL (ref 70–99)
Potassium: 4.9 mmol/L (ref 3.5–5.1)
Sodium: 135 mmol/L (ref 135–145)
TOTAL PROTEIN: 6.8 g/dL (ref 6.5–8.1)

## 2018-06-28 LAB — PREPARE RBC (CROSSMATCH)

## 2018-06-28 LAB — CBC WITH DIFFERENTIAL/PLATELET
BASOS PCT: 0 %
Basophils Absolute: 0 10*3/uL (ref 0.0–0.1)
Eosinophils Absolute: 0 10*3/uL (ref 0.0–0.7)
Eosinophils Relative: 0 %
HEMATOCRIT: 24.2 % — AB (ref 39.0–52.0)
HEMOGLOBIN: 7.9 g/dL — AB (ref 13.0–17.0)
LYMPHS ABS: 0.4 10*3/uL — AB (ref 0.7–4.0)
Lymphocytes Relative: 14 %
MCH: 31.3 pg (ref 26.0–34.0)
MCHC: 32.6 g/dL (ref 30.0–36.0)
MCV: 96 fL (ref 78.0–100.0)
Monocytes Absolute: 0.1 10*3/uL (ref 0.1–1.0)
Monocytes Relative: 4 %
NEUTROS ABS: 2.7 10*3/uL (ref 1.7–7.7)
NEUTROS PCT: 82 %
Platelets: 236 10*3/uL (ref 150–400)
RBC: 2.52 MIL/uL — ABNORMAL LOW (ref 4.22–5.81)
RDW: 18.2 % — ABNORMAL HIGH (ref 11.5–15.5)
WBC: 3.3 10*3/uL — ABNORMAL LOW (ref 4.0–10.5)

## 2018-06-28 LAB — SAMPLE TO BLOOD BANK

## 2018-06-29 ENCOUNTER — Inpatient Hospital Stay (HOSPITAL_COMMUNITY): Payer: Medicare Other

## 2018-06-29 ENCOUNTER — Encounter (HOSPITAL_COMMUNITY): Payer: Self-pay

## 2018-06-29 DIAGNOSIS — Z5111 Encounter for antineoplastic chemotherapy: Secondary | ICD-10-CM | POA: Diagnosis not present

## 2018-06-29 DIAGNOSIS — C670 Malignant neoplasm of trigone of bladder: Secondary | ICD-10-CM

## 2018-06-29 MED ORDER — SODIUM CHLORIDE 0.9% IV SOLUTION
250.0000 mL | Freq: Once | INTRAVENOUS | Status: AC
  Administered 2018-06-29: 250 mL via INTRAVENOUS

## 2018-06-29 MED ORDER — HEPARIN SOD (PORK) LOCK FLUSH 100 UNIT/ML IV SOLN
500.0000 [IU] | Freq: Every day | INTRAVENOUS | Status: AC | PRN
  Administered 2018-06-29: 500 [IU]
  Filled 2018-06-29: qty 5

## 2018-06-29 MED ORDER — ACETAMINOPHEN 325 MG PO TABS
650.0000 mg | ORAL_TABLET | Freq: Once | ORAL | Status: AC
  Administered 2018-06-29: 650 mg via ORAL
  Filled 2018-06-29: qty 2

## 2018-06-29 MED ORDER — SODIUM CHLORIDE 0.9% FLUSH
10.0000 mL | INTRAVENOUS | Status: AC | PRN
  Administered 2018-06-29: 10 mL

## 2018-06-29 MED ORDER — DIPHENHYDRAMINE HCL 25 MG PO CAPS
25.0000 mg | ORAL_CAPSULE | Freq: Once | ORAL | Status: AC
  Administered 2018-06-29: 25 mg via ORAL
  Filled 2018-06-29: qty 1

## 2018-06-29 NOTE — Progress Notes (Signed)
Carl Murphy tolerated PRBC transfusion without incident or complaint. VSS before, during, and after administration. Discharged in satisfactory condition via wheelchair accompanied by family members.

## 2018-06-29 NOTE — Patient Instructions (Signed)
[facility] at [facility] Discharge Instructions  Received 1 unit of blood today. Follow-up as scheduled. Call clinic for any questions or concerns   Thank you for choosing [facility] at [facility] to provide your oncology and hematology care.  To afford each patient quality time with our provider, please arrive at least 15 minutes before your scheduled appointment time.   If you have a lab appointment with the Cancer Center please come in thru the  Main Entrance and check in at the main information desk  You need to re-schedule your appointment should you arrive 10 or more minutes late.  We strive to give you quality time with our providers, and arriving late affects you and other patients whose appointments are after yours.  Also, if you no show three or more times for appointments you may be dismissed from the clinic at the providers discretion.     Again, thank you for choosing [facility].  Our hope is that these requests will decrease the amount of time that you wait before being seen by our physicians.       _____________________________________________________________  Should you have questions after your visit to [facility], please contact our office at [phone] between the hours of 8:00 a.m. and 4:30 p.m.  Voicemails left after 4:00 p.m. will not be returned until the following business day.  For prescription refill requests, have your pharmacy contact our office and allow 72 hours.    Cancer Center Support Programs:   > Cancer Support Group  2nd Tuesday of the month 1pm-2pm, Journey Room   

## 2018-06-30 ENCOUNTER — Ambulatory Visit (HOSPITAL_COMMUNITY): Payer: Medicare Other | Admitting: Hematology

## 2018-06-30 ENCOUNTER — Ambulatory Visit (HOSPITAL_COMMUNITY): Payer: Medicare Other

## 2018-06-30 ENCOUNTER — Other Ambulatory Visit (HOSPITAL_COMMUNITY): Payer: Medicare Other

## 2018-06-30 LAB — TYPE AND SCREEN
ABO/RH(D): O POS
ANTIBODY SCREEN: NEGATIVE
Unit division: 0

## 2018-06-30 LAB — BPAM RBC
BLOOD PRODUCT EXPIRATION DATE: 201909022359
ISSUE DATE / TIME: 201907300912
UNIT TYPE AND RH: 5100

## 2018-07-02 ENCOUNTER — Other Ambulatory Visit (HOSPITAL_COMMUNITY): Payer: Self-pay

## 2018-07-02 ENCOUNTER — Other Ambulatory Visit (HOSPITAL_COMMUNITY): Payer: Medicare Other

## 2018-07-02 ENCOUNTER — Ambulatory Visit (HOSPITAL_COMMUNITY): Payer: Medicare Other | Admitting: Hematology

## 2018-07-02 ENCOUNTER — Telehealth (HOSPITAL_COMMUNITY): Payer: Self-pay

## 2018-07-02 ENCOUNTER — Ambulatory Visit (HOSPITAL_COMMUNITY): Payer: Medicare Other

## 2018-07-02 MED ORDER — SUCRALFATE 1 GM/10ML PO SUSP: ORAL | 2 refills | Status: AC

## 2018-07-02 NOTE — Telephone Encounter (Signed)
Pt's daughter called about pt's complaint of 2 mouth sores that are bothering him especially when he tries to eat. Sent Carafate script to [facility] and instructed pt's daughter on how to use it so she could inform her dad. She verbalized understanding and will call back if this doesn't help

## 2018-07-09 ENCOUNTER — Inpatient Hospital Stay (HOSPITAL_BASED_OUTPATIENT_CLINIC_OR_DEPARTMENT_OTHER): Payer: Medicare Other | Admitting: Hematology

## 2018-07-09 ENCOUNTER — Inpatient Hospital Stay (HOSPITAL_COMMUNITY): Payer: Medicare Other

## 2018-07-09 ENCOUNTER — Other Ambulatory Visit: Payer: Self-pay

## 2018-07-09 ENCOUNTER — Inpatient Hospital Stay (HOSPITAL_COMMUNITY): Payer: Medicare Other | Attending: Hematology

## 2018-07-09 ENCOUNTER — Encounter (HOSPITAL_COMMUNITY): Payer: Self-pay | Admitting: Hematology

## 2018-07-09 ENCOUNTER — Encounter (HOSPITAL_COMMUNITY): Payer: Self-pay

## 2018-07-09 DIAGNOSIS — J441 Chronic obstructive pulmonary disease with (acute) exacerbation: Secondary | ICD-10-CM

## 2018-07-09 DIAGNOSIS — Z936 Other artificial openings of urinary tract status: Secondary | ICD-10-CM

## 2018-07-09 DIAGNOSIS — Z9289 Personal history of other medical treatment: Secondary | ICD-10-CM

## 2018-07-09 DIAGNOSIS — C670 Malignant neoplasm of trigone of bladder: Secondary | ICD-10-CM

## 2018-07-09 DIAGNOSIS — R634 Abnormal weight loss: Secondary | ICD-10-CM | POA: Insufficient documentation

## 2018-07-09 DIAGNOSIS — N189 Chronic kidney disease, unspecified: Secondary | ICD-10-CM | POA: Insufficient documentation

## 2018-07-09 DIAGNOSIS — C7800 Secondary malignant neoplasm of unspecified lung: Secondary | ICD-10-CM

## 2018-07-09 DIAGNOSIS — R630 Anorexia: Secondary | ICD-10-CM

## 2018-07-09 DIAGNOSIS — Z720 Tobacco use: Secondary | ICD-10-CM | POA: Insufficient documentation

## 2018-07-09 DIAGNOSIS — R0602 Shortness of breath: Secondary | ICD-10-CM

## 2018-07-09 DIAGNOSIS — N184 Chronic kidney disease, stage 4 (severe): Secondary | ICD-10-CM

## 2018-07-09 DIAGNOSIS — C679 Malignant neoplasm of bladder, unspecified: Secondary | ICD-10-CM

## 2018-07-09 DIAGNOSIS — D649 Anemia, unspecified: Secondary | ICD-10-CM | POA: Insufficient documentation

## 2018-07-09 DIAGNOSIS — R3981 Functional urinary incontinence: Secondary | ICD-10-CM

## 2018-07-09 LAB — CBC WITH DIFFERENTIAL/PLATELET
Basophils Absolute: 0 10*3/uL (ref 0.0–0.1)
Basophils Relative: 0 %
Eosinophils Absolute: 0 10*3/uL (ref 0.0–0.7)
Eosinophils Relative: 0 %
HCT: 23.6 % — ABNORMAL LOW (ref 39.0–52.0)
HEMOGLOBIN: 7.6 g/dL — AB (ref 13.0–17.0)
LYMPHS ABS: 0.5 10*3/uL — AB (ref 0.7–4.0)
Lymphocytes Relative: 8 %
MCH: 30.6 pg (ref 26.0–34.0)
MCHC: 32.2 g/dL (ref 30.0–36.0)
MCV: 95.2 fL (ref 78.0–100.0)
MONOS PCT: 11 %
Monocytes Absolute: 0.6 10*3/uL (ref 0.1–1.0)
NEUTROS ABS: 4.5 10*3/uL (ref 1.7–7.7)
NEUTROS PCT: 81 %
Platelets: 209 10*3/uL (ref 150–400)
RBC: 2.48 MIL/uL — ABNORMAL LOW (ref 4.22–5.81)
RDW: 18.8 % — ABNORMAL HIGH (ref 11.5–15.5)
WBC: 5.7 10*3/uL (ref 4.0–10.5)

## 2018-07-09 LAB — COMPREHENSIVE METABOLIC PANEL
ALBUMIN: 2.9 g/dL — AB (ref 3.5–5.0)
ALK PHOS: 45 U/L (ref 38–126)
ALT: 9 U/L (ref 0–44)
ANION GAP: 8 (ref 5–15)
AST: 14 U/L — ABNORMAL LOW (ref 15–41)
BILIRUBIN TOTAL: 0.5 mg/dL (ref 0.3–1.2)
BUN: 50 mg/dL — ABNORMAL HIGH (ref 8–23)
CALCIUM: 8.1 mg/dL — AB (ref 8.9–10.3)
CO2: 19 mmol/L — AB (ref 22–32)
CREATININE: 4.82 mg/dL — AB (ref 0.61–1.24)
Chloride: 111 mmol/L (ref 98–111)
GFR calc Af Amer: 13 mL/min — ABNORMAL LOW (ref >60)
GFR calc non Af Amer: 11 mL/min — ABNORMAL LOW (ref >60)
GLUCOSE: 97 mg/dL (ref 70–99)
Potassium: 5.1 mmol/L (ref 3.5–5.1)
SODIUM: 138 mmol/L (ref 135–145)
TOTAL PROTEIN: 6.6 g/dL (ref 6.5–8.1)

## 2018-07-09 LAB — SAMPLE TO BLOOD BANK

## 2018-07-09 LAB — PREPARE RBC (CROSSMATCH)

## 2018-07-09 MED ORDER — DIPHENHYDRAMINE HCL 25 MG PO CAPS
25.0000 mg | ORAL_CAPSULE | Freq: Once | ORAL | Status: AC
  Administered 2018-07-09: 25 mg via ORAL
  Filled 2018-07-09: qty 1

## 2018-07-09 MED ORDER — IPRATROPIUM-ALBUTEROL 0.5-2.5 (3) MG/3ML IN SOLN
3.0000 mL | Freq: Once | RESPIRATORY_TRACT | Status: AC
  Administered 2018-07-09: 3 mL via RESPIRATORY_TRACT
  Filled 2018-07-09: qty 3

## 2018-07-09 MED ORDER — HEPARIN SOD (PORK) LOCK FLUSH 100 UNIT/ML IV SOLN
500.0000 [IU] | Freq: Every day | INTRAVENOUS | Status: AC | PRN
  Administered 2018-07-09: 500 [IU]

## 2018-07-09 MED ORDER — SODIUM CHLORIDE 0.9% FLUSH
10.0000 mL | INTRAVENOUS | Status: AC | PRN
  Administered 2018-07-09: 10 mL

## 2018-07-09 MED ORDER — ACETAMINOPHEN 325 MG PO TABS
650.0000 mg | ORAL_TABLET | Freq: Once | ORAL | Status: AC
  Administered 2018-07-09: 650 mg via ORAL
  Filled 2018-07-09: qty 2

## 2018-07-09 MED ORDER — SODIUM CHLORIDE 0.9% IV SOLUTION
250.0000 mL | Freq: Once | INTRAVENOUS | Status: AC
  Administered 2018-07-09: 250 mL via INTRAVENOUS

## 2018-07-09 MED ORDER — HEPARIN SOD (PORK) LOCK FLUSH 100 UNIT/ML IV SOLN
INTRAVENOUS | Status: AC
  Filled 2018-07-09: qty 5

## 2018-07-09 NOTE — Progress Notes (Signed)
Patient to receive 2 units of blood today. No chemo today. Patient to be changed to Day 1 and Day 15 thus omitting Day 8 of chemo moving forward. Chemo to resume next Friday 07/16/18. Following chemo with MD follow up on Thursday 07/29/18.

## 2018-07-09 NOTE — Patient Instructions (Signed)
[facility]  Discharge Instructions:   _______________________________________________________________  Thank you for choosing [facility] at [facility] to provide your oncology and hematology care.  To afford each patient quality time with our providers, please arrive at least 15 minutes before your scheduled appointment.  You need to re-schedule your appointment if you arrive 10 or more minutes late.  We strive to give you quality time with our providers, and arriving late affects you and other patients whose appointments are after yours.  Also, if you no show three or more times for appointments you may be dismissed from the clinic.  Again, thank you for choosing [facility] at [facility]. Our hope is that these requests will allow you access to exceptional care and in a timely manner. _______________________________________________________________  If you have questions after your visit, please contact our office at [phone] between the hours of 8:30 a.m. and 5:00 p.m. Voicemails left after 4:30 p.m. will not be returned until the following business day. _______________________________________________________________  For prescription refill requests, have your pharmacy contact our office. _______________________________________________________________  Recommendations made by the consultant and any test results will be sent to your referring physician. _______________________________________________________________ 

## 2018-07-09 NOTE — Progress Notes (Signed)
[address], [place] [address]   CLINIC:  Medical Oncology/Hematology  PCP:  [name], [address] [address] [phone]   REASON FOR VISIT:  Follow-up for metastatic bladder cancer  CURRENT THERAPY: [name] every 3 weeks with neulasta support  BRIEF ONCOLOGIC HISTORY:  Oncology History   Per available records had bladder tumor removed but had extension into the muscularis propria. CT showed no extension beyond the bladder. Has seen 2 urologists in consultation. Adamant about not having a cystectomy and ileal conduit.     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 No findings for local spread of disease or metastatic disease    03/25/2016 Surgery    TURBT 3cm right trigonal bladder tumor. followed by intravesical epirubicin instillation    03/25/2016 Pathology Results    bladder TURBT infiltrative high grade papillary urothelial carcinoma, invades the mulcularis propria (detrusor) no LVI     04/14/2016 Miscellaneous    [name] consultation in [facility] for consideration of cystectomy. patient also offered repeat resection with BCG, and CHEMO/XRT    06/17/2016 Pathology Results    TURB- high grade papillary urothelial carcinoma with focal stromal invasion     Radiation Therapy    Planned to be completed on 09/10/2016.    07/22/2016 -  Chemotherapy    The patient had palonosetron (ALOXI) injection 0.25 mg, 0.25 mg, Intravenous,  Once, 2 of 5 cycles Administration: 0.25 mg (07/28/2016)  CARBOplatin (PARAPLATIN) 160 mg in sodium chloride 0.9 % 100 mL chemo infusion, 160 mg (100 % of original dose 160.6 mg), Intravenous,  Once, 2 of 5 cycles Dose modification:   (original dose 160.6 mg, Cycle 1),   (original dose 160.6 mg, Cycle 1),   (original dose 160.6 mg, Cycle 1),   (original dose 160.6 mg, Cycle  2) Administration: 180 mg (07/28/2016)  for chemotherapy treatment.      04/07/2017 Relapse/Recurrence    CT Abd/pelvis: IMPRESSION: 1. Recurrent bladder carcinoma with synchronous disease in the distal right ureter causing severe right hydronephrosis and decreased right renal function. 2. Bilateral renal stones. 3. Aortic atherosclerosis (ICD10-170.0). Coronary artery calcification. 4. Small periumbilical hernia contains a knuckle of unobstructed small bowel.     06/10/2017 PET scan    1. Metachronous urothelial carcinoma within the distal right ureter, as on prior CT. 2. No evidence of nodal metastasis. 3. Bilateral hypermetabolic pulmonary nodules, favoring pulmonary metastasis. Especially given emphysema, 1 or more primary bronchogenic carcinomas cannot be excluded but are felt less likely. 4. Coronary artery atherosclerosis. Aortic Atherosclerosis (ICD10-I70.0). 5. Bladder not well evaluated secondary to hypermetabolic urine within.     06/18/2017 Procedure    Placement of single lumen port a cath via right internal jugular vein. The catheter tip lies at the cavo-atrial junction. A power injectable port a cath was placed and is ready for immediate use.    06/29/2017 -  Chemotherapy    Carboplatin/Gemcitabine every 21 days     08/14/2017 Imaging    CT C/A/P IMPRESSION: 1. Overall stable pulmonary nodules. Knee soft tissue rim around the cavitary lesion in the left upper lobe is slightly thinner. No new pulmonary nodules or progressive findings in the chest. 2. Stable advanced emphysematous changes and pulmonary scarring. 3. No mediastinal or hilar mass or lymphadenopathy. 4. Improved CT appearance of the bladder. The asymmetric areas  of bladder wall enhancement have improved. There is still diffuse irregular bladder wall thickening and distal right ureteral tumor appears stable. Stable right-sided hydroureteronephrosis. 5. Stable cirrhotic changes involving the liver  and stable liver cysts. 6. Stable scattered mesenteric and retroperitoneal and pelvic lymph nodes but no mass or overt adenopathy. 7. Stable severe/advanced atherosclerotic calcifications involving the thoracic and abdominal aorta and branch vessels.    11/18/2017 Imaging    PET CT: IMPRESSION: 1. Significant partial metabolic response. Bilateral mildly hypermetabolic pulmonary metastases are decreased in size and metabolism. No new or progressive hypermetabolic metastatic disease. 2. Stable hypermetabolic distal right ureteral tumor with marked right hydroureteronephrosis. 3. Chronic findings include: Aortic Atherosclerosis (ICD10-I70.0) and Emphysema (ICD10-J43.9). Three-vessel coronary atherosclerosis. Stable small fat containing umbilical hernia.    03/02/2018 Imaging    Whole body PET CT scan  IMPRESSION: 1. Progressive pulmonary metastatic disease with enlarging and progressively hypermetabolic pulmonary nodules as described above. 2. New nodularity at the left lung base is hypermetabolic but could be an inflammatory or infectious process. 3. Stable hypermetabolic tumor in the right distal ureter. 4. No evidence of metastatic disease involving the abdomen/pelvis or osseous structures. 5. Chronic bilateral hydroureteronephrosis and asymmetric bladder wall thickening. 6. Stable severe atherosclerotic disease    03/08/2018 - 06/08/2018 Chemotherapy    The patient had pembrolizumab (KEYTRUDA) 200 mg in sodium chloride 0.9 % 50 mL chemo infusion, 200 mg, Intravenous, Once, 4 of 4 cycles Administration: 200 mg (03/09/2018), 200 mg (03/30/2018), 200 mg (04/28/2018), 200 mg (05/19/2018)  for chemotherapy treatment.     06/10/2018 -  Chemotherapy    The patient had ondansetron (ZOFRAN) 8 mg in sodium chloride 0.9 % 50 mL IVPB, , Intravenous,  Once, 1 of 4 cycles PACLitaxel-protein bound ([name]) chemo infusion 150 mg, 75 mg/m2 = 150 mg (100 % of original dose 75 mg/m2), Intravenous, Once,  1 of 4 cycles Dose modification: 75 mg/m2 (original dose 75 mg/m2, Cycle 1, Reason: Other (see comments)), 100 mg/m2 (original dose 75 mg/m2, Cycle 1, Reason: Other (see comments)), 75 mg/m2 (original dose 75 mg/m2, Cycle 1, Reason: Other (see comments)) Administration: 150 mg (06/11/2018), 150 mg (06/18/2018), 150 mg (06/25/2018)  for chemotherapy treatment.       CANCER STAGING: Cancer Staging Malignant neoplasm of trigone of bladder ([place]) Staging form: Urinary Bladder, AJCC 7th Edition - Clinical stage from 05/15/2016: Stage II (T2, N0, M0) - Signed by Baird Cancer, PA-C on 08/11/2016 - Pathologic stage from 06/29/2017: Stage IV (T2a, N0, M1) - Signed by Baird Cancer, PA-C on 06/29/2017    INTERVAL HISTORY:  Mr. Sox 72 y.o. male returns for routine follow-up with metastatic bladder cancer. Patient is here today with his daughter. He is sleeping about 20 hours a day. When he is awake he takes his breathing treatments for his SOB. He fatigues very easily. He still has his bilateral nephrostomy tubes. The left tube puts out more urine then the right. He is still voiding some and having urinary spasms. He is unable to control his bowel or bladder at that time. He does not get the urge to void. Patient still lives at home alone. He has his daughter checking on him frequently. They also help him with any errands or ADLs if he should need help. Patient appetite is decreased he has had a 6 pound weight loss. His energy levels are 25%.    REVIEW OF SYSTEMS:  Review of Systems  Constitutional: Positive for fatigue.  HENT:  Negative.  Respiratory: Positive for cough and shortness of breath.   Cardiovascular: Negative.   Gastrointestinal: Negative.   Endocrine: Negative.   Genitourinary: Positive for bladder incontinence.   Musculoskeletal: Negative.   Skin: Negative.   Neurological: Negative.   Hematological: Negative.   Psychiatric/Behavioral: Negative.      PAST  MEDICAL/SURGICAL HISTORY:  Past Medical History:  Diagnosis Date  . Arthritis   . Cancer [facility])    Bladder Cancer  . CHF (congestive heart failure) ([place])   . COPD (chronic obstructive pulmonary disease) ([place])   . Depression   . Hard of hearing    bilat   . Hypertension   . Malignant neoplasm of trigone of bladder ([place]) 03/25/2016  . PONV (postoperative nausea and vomiting)   . Shortness of breath dyspnea    heat; walking up set of stairs; pt states can walk up 12-14 stairs slowly without having to stop to catch his breath   Past Surgical History:  Procedure Laterality Date  . CYSTOSCOPY N/A 06/16/2016   Procedure: CYSTOSCOPY;  Surgeon: Franchot Gallo, MD;  Location: [facility];  Service: Urology;  Laterality: N/A;  . IR FLUORO GUIDE PORT INSERTION RIGHT  06/18/2017  . IR NEPHROSTOMY EXCHANGE LEFT  05/20/2018  . IR NEPHROSTOMY EXCHANGE RIGHT  05/20/2018  . IR NEPHROSTOMY PLACEMENT LEFT  03/25/2018  . IR NEPHROSTOMY PLACEMENT RIGHT  03/25/2018  . IR US GUIDE VASC ACCESS RIGHT  06/18/2017  . TRANSURETHRAL RESECTION OF BLADDER TUMOR N/A 03/25/2016   Procedure: TRANSURETHRAL RESECTION OF BLADDER TUMOR (TURBT);  Surgeon: Franchot Gallo, MD;  Location: [facility];  Service: Urology;  Laterality: N/A;  . TRANSURETHRAL RESECTION OF BLADDER TUMOR N/A 06/16/2016   Procedure: TRANSURETHRAL RESECTION OF BLADDER TUMOR (TURBT);  Surgeon: Franchot Gallo, MD;  Location: [facility];  Service: Urology;  Laterality: N/A;  . TRANSURETHRAL RESECTION OF BLADDER TUMOR N/A 12/16/2016   Procedure: TRANSURETHRAL RESECTION OF BLADDER TUMOR (TURBT);  Surgeon: Franchot Gallo, MD;  Location: [facility];  Service: Urology;  Laterality: N/A;  . TRANSURETHRAL RESECTION OF BLADDER TUMOR N/A 05/11/2017   Procedure: TRANSURETHRAL RESECTION OF BLADDER TUMOR (TURBT) CLOT EVACUATION;  Surgeon: Franchot Gallo, MD;  Location: [facility];  Service: Urology;  Laterality: N/A;  [place] BLUE MEDICARE-[id number]  . TRANSURETHRAL  RESECTION OF BLADDER TUMOR N/A 04/01/2018   Procedure: TRANSURETHRAL RESECTION OF BLADDER TUMOR (TURBT);  Surgeon: Franchot Gallo, MD;  Location: [facility];  Service: Urology;  Laterality: N/A;  1 HR [phone] BLUE MEDICARE-[id number]     SOCIAL HISTORY:  Social History   Socioeconomic History  . Marital status: Married    Spouse name: Not on file  . Number of children: Not on file  . Years of education: Not on file  . Highest education level: Not on file  Occupational History  . Not on file  Social Needs  . Financial resource strain: Not on file  . Food insecurity:    Worry: Not on file    Inability: Not on file  . Transportation needs:    Medical: Not on file    Non-medical: Not on file  Tobacco Use  . Smoking status: Current Every Day Smoker    Packs/day: 2.00    Years: 50.00    Pack years: 100.00    Types: Cigarettes  . Smokeless tobacco: Never Used  Substance and Sexual Activity  . Alcohol use: No    Comment: occasionally  . Drug use: No  . Sexual activity: Never    Birth control/protection: None  Lifestyle  . Physical activity:    Days per week: Not on file    Minutes per session: Not on file  . Stress: Not on file  Relationships  . Social connections:    Talks on phone: Not on file    Gets together: Not on file    Attends religious service: Not on file    Active member of club or organization: Not on file    Attends meetings of clubs or organizations: Not on file    Relationship status: Not on file  . Intimate partner violence:    Fear of current or ex partner: Not on file    Emotionally abused: Not on file    Physically abused: Not on file    Forced sexual activity: Not on file  Other Topics Concern  . Not on file  Social History Narrative  . Not on file    FAMILY HISTORY:  Family History  Problem Relation Age of Onset  . Hypertension Mother   . Asthma Father   . Emphysema Father     CURRENT MEDICATIONS:  Outpatient Encounter  Medications as of 07/09/2018  Medication Sig  . acetaminophen (TYLENOL) 500 MG tablet Take 500 mg by mouth as needed (PRN for pain).  Marland Kitchen amLODipine (NORVASC) 5 MG tablet Take 5 mg by mouth daily.   . carvedilol (COREG) 25 MG tablet Take 25 mg by mouth 2 (two) times daily.   . cetirizine (ZYRTEC) 10 MG tablet Take 10 mg by mouth daily.  . citalopram (CELEXA) 20 MG tablet Take 20 mg by mouth at bedtime.   . fluticasone (FLONASE) 50 MCG/ACT nasal spray Place 2 sprays into both nostrils daily as needed for allergies or rhinitis.  Marland Kitchen ipratropium-albuterol (DUONEB) 0.5-2.5 (3) MG/3ML SOLN Take 3 mLs by nebulization every 6 (six) hours as needed.  . ondansetron (ZOFRAN-ODT) 4 MG disintegrating tablet Take 1 tablet (4 mg total) by mouth every 8 (eight) hours as needed for nausea or vomiting. (Patient not taking: Reported on 07/09/2018)  . oxybutynin (DITROPAN) 5 MG tablet Take 1 tablet (5 mg total) by mouth every 6 (six) hours as needed for bladder spasms.  . prochlorperazine (COMPAZINE) 10 MG tablet Take 1 tablet (10 mg total) by mouth every 6 (six) hours as needed for nausea or vomiting. (Patient not taking: Reported on 07/09/2018)  . sucralfate (CARAFATE) 1 GM/10ML suspension Carafate 1gm/44m and Viscous lidocaine 2% 1:1 mixture. Swish and swallow 1 tablespoon four times a day  . VENTOLIN HFA 108 (90 Base) MCG/ACT inhaler Inhale 1-2 puffs into the lungs every 4 (four) hours as needed.   . [DISCONTINUED] oxyCODONE (ROXICODONE) 5 MG immediate release tablet Take 1 tablet (5 mg total) by mouth every 4 (four) hours as needed for severe pain. (Patient not taking: Reported on 07/09/2018)   Facility-Administered Encounter Medications as of 07/09/2018  Medication  . ipratropium-albuterol (DUONEB) 0.5-2.5 (3) MG/3ML nebulizer solution 3 mL    ALLERGIES:  Allergies  Allergen Reactions  . Carboplatin Shortness Of Breath    Reaction occurred after patient left Cancer Center. Pt reported extreme sob after leaving CC  that eventually resolved on its own.     PHYSICAL EXAM:  ECOG Performance status: 1  Vital Signs: BP: 100/49, P:58, R:20, TEMP:97.7, SAT:95%  Physical Exam  Constitutional: He is oriented to person, place, and time.  Cardiovascular: Normal rate, regular rhythm and normal heart sounds.  Pulmonary/Chest: He has wheezes.  Neurological: He is alert and oriented to person, place, and time.  Skin: Skin is warm and dry.     LABORATORY DATA:  I have reviewed the labs as listed.  CBC    Component Value Date/Time   WBC 5.7 07/09/2018 1007   RBC 2.48 (L) 07/09/2018 1007   HGB 7.6 (L) 07/09/2018 1007   HCT 23.6 (L) 07/09/2018 1007   PLT 209 07/09/2018 1007   MCV 95.2 07/09/2018 1007   MCH 30.6 07/09/2018 1007   MCHC 32.2 07/09/2018 1007   RDW 18.8 (H) 07/09/2018 1007   LYMPHSABS 0.5 (L) 07/09/2018 1007   MONOABS 0.6 07/09/2018 1007   EOSABS 0.0 07/09/2018 1007   BASOSABS 0.0 07/09/2018 1007   CMP Latest Ref Rng & Units 07/09/2018 06/28/2018 06/25/2018  Glucose 70 - 99 mg/dL 97 89 115(H)  BUN 8 - 23 mg/dL 50(H) 46(H) 47(H)  Creatinine 0.61 - 1.24 mg/dL 4.82(H) 4.49(H) 4.63(H)  Sodium 135 - 145 mmol/L 138 135 136  Potassium 3.5 - 5.1 mmol/L 5.1 4.9 4.9  Chloride 98 - 111 mmol/L 111 111 110  CO2 22 - 32 mmol/L 19(L) 17(L) 17(L)  Calcium 8.9 - 10.3 mg/dL 8.1(L) 7.6(L) 8.1(L)  Total Protein 6.5 - 8.1 g/dL 6.6 6.8 6.6  Total Bilirubin 0.3 - 1.2 mg/dL 0.5 0.5 0.3  Alkaline Phos 38 - 126 U/L 45 53 44  AST 15 - 41 U/L 14(L) 15 16  ALT 0 - 44 U/L _0 ASSESSMENT & PLAN:   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  Stage IV bladder cancer with lung metastasis (DX 06/29/2017): Foundation 1 shows MS-stable, TMB-intermediate,HRAS-G12S (clinical trails with binimetinib, cobimetinib, trametinib) - Carboplatin and radiation therapy from 07/22/2016 through 09/08/2016 for local disease -Status post 6 cycles of carboplatin and gemcitabine completed on 11/02/2017 after  development of metastatic disease -PET/CT scan on 11/18/2017 showing significant partial metabolic response -Maintenance gemcitabine day 1 and day 15 every 28 days started on 12/03/2017, PET CT scan showing worsening of pulmonary metastatic disease - CT scan of the chest on 04/20/2018 which showed no evidence of pneumonitis.  Left upper lobe cavitary nodule has slightly increased in size measuring 1.8 x 2.3 cm.  This was measuring 1.6 x 1.7 cm on previous PET scan on 03/01/2018.  Right upper lobe nodule was stable.  Right middle lobe lung nodule increased to 1.4 from 1 cm.  This was done for increased shortness of breath, which improved with inhalers prescribed by his PMD.  I did not think the findings on the CT scan represented progression as the increase in size was too small and a CT with contrast was compared to PET/CT without contrast. - He underwent TURBT on 04/01/2018, biopsy was consistent with invasive high-grade papillary urothelial carcinoma. - 4 cycles of Keytruda from 03/09/2018 through 05/19/2018. -I reviewed the results of the CT scan of the chest, abdomen and pelvis dated 06/07/2018 which showed increase in largest of the lung metastasis by 1 cm and several other lesions by 5 to 7 mm. - [name] at 75 mg/m day 1, 8, 15 every 28 days started on 06/11/2018.  He felt very tired and had one fall since then.  He received 1 unit of PRBC on 06/29/2018.  He is sleeping most part of the day. -I will hold on starting cycle 2 today.  He will receive 2 units of blood transfusion.  I will change his chemotherapy to day 1 and day 15 every 28 days. -We will schedule day 1 chemotherapy next week.  I will see him back on day 15 treatment.  2.  CKD:He underwent bilateral percutaneous nephrostomy tube placement on 03/25/2018.  He underwent TURBT on 04/01/2018.  Right nephrostomy tube is not putting out much.  Left nephrostomy tube is draining well.  He will have nephrostomy tubes changed soon.  He is putting out clots  through his penis.  3.  Anemia: He received 2 Feraheme infusions on 05/19/2018 and 06/11/2018.  Today he will receive 2 units of blood transfusion.  4.  Weight loss: -He has lost a couple of pounds since 06/11/2018.      Orders placed this encounter:  No orders of the defined types were placed in this encounter.     Derek Jack, MD [place] [phone]

## 2018-07-09 NOTE — Progress Notes (Signed)
Patient tolerated blood transfusions with no complaints voiced. Port site clean and dry with no bruising or swelling noted at site.  Good blood return noted before and after administration of blood transfusions.  Band aid applied.  VSs with discharge and left by wheelchair with family. No s/s of distress noted.

## 2018-07-09 NOTE — Assessment & Plan Note (Signed)
1.  Stage IV bladder cancer with lung metastasis (DX 06/29/2017): Foundation 1 shows MS-stable, TMB-intermediate,HRAS-G12S (clinical trails with binimetinib, cobimetinib, trametinib) - Carboplatin and radiation therapy from 07/22/2016 through 09/08/2016 for local disease -Status post 6 cycles of carboplatin and gemcitabine completed on 11/02/2017 after development of metastatic disease -PET/CT scan on 11/18/2017 showing significant partial metabolic response -Maintenance gemcitabine day 1 and day 15 every 28 days started on 12/03/2017, PET CT scan showing worsening of pulmonary metastatic disease - CT scan of the chest on 04/20/2018 which showed no evidence of pneumonitis.  Left upper lobe cavitary nodule has slightly increased in size measuring 1.8 x 2.3 cm.  This was measuring 1.6 x 1.7 cm on previous PET scan on 03/01/2018.  Right upper lobe nodule was stable.  Right middle lobe lung nodule increased to 1.4 from 1 cm.  This was done for increased shortness of breath, which improved with inhalers prescribed by his PMD.  I did not think the findings on the CT scan represented progression as the increase in size was too small and a CT with contrast was compared to PET/CT without contrast. - He underwent TURBT on 04/01/2018, biopsy was consistent with invasive high-grade papillary urothelial carcinoma. - 4 cycles of Keytruda from 03/09/2018 through 05/19/2018. -I reviewed the results of the CT scan of the chest, abdomen and pelvis dated 06/07/2018 which showed increase in largest of the lung metastasis by 1 cm and several other lesions by 5 to 7 mm. - [name] at 75 mg/m day 1, 8, 15 every 28 days started on 06/11/2018.  He felt very tired and had one fall since then.  He received 1 unit of PRBC on 06/29/2018.  He is sleeping most part of the day. -I will hold on starting cycle 2 today.  He will receive 2 units of blood transfusion.  I will change his chemotherapy to day 1 and day 15 every 28 days. -We will schedule day 1  chemotherapy next week.  I will see him back on day 15 treatment.  2.  CKD:He underwent bilateral percutaneous nephrostomy tube placement on 03/25/2018.  He underwent TURBT on 04/01/2018.  Right nephrostomy tube is not putting out much.  Left nephrostomy tube is draining well.  He will have nephrostomy tubes changed soon.  He is putting out clots through his penis.  3.  Anemia: He received 2 Feraheme infusions on 05/19/2018 and 06/11/2018.  Today he will receive 2 units of blood transfusion.  4.  Weight loss: -He has lost a couple of pounds since 06/11/2018.

## 2018-07-09 NOTE — Patient Instructions (Signed)
[facility] at [facility] Discharge Instructions     Thank you for choosing [facility] at [facility] to provide your oncology and hematology care.  To afford each patient quality time with our provider, please arrive at least 15 minutes before your scheduled appointment time.   If you have a lab appointment with the Cancer Center please come in thru the  Main Entrance and check in at the main information desk  You need to re-schedule your appointment should you arrive 10 or more minutes late.  We strive to give you quality time with our providers, and arriving late affects you and other patients whose appointments are after yours.  Also, if you no show three or more times for appointments you may be dismissed from the clinic at the providers discretion.     Again, thank you for choosing [facility].  Our hope is that these requests will decrease the amount of time that you wait before being seen by our physicians.       _____________________________________________________________  Should you have questions after your visit to [facility], please contact our office at [phone] between the hours of 8:00 a.m. and 4:30 p.m.  Voicemails left after 4:00 p.m. will not be returned until the following business day.  For prescription refill requests, have your pharmacy contact our office and allow 72 hours.    Cancer Center Support Programs:   > Cancer Support Group  2nd Tuesday of the month 1pm-2pm, Journey Room    

## 2018-07-10 LAB — TYPE AND SCREEN
ABO/RH(D): O POS
ANTIBODY SCREEN: NEGATIVE
UNIT DIVISION: 0
UNIT DIVISION: 0

## 2018-07-10 LAB — BPAM RBC
Blood Product Expiration Date: 201909022359
Blood Product Expiration Date: 201909062359
ISSUE DATE / TIME: 201908091419
ISSUE DATE / TIME: 201908091236
Unit Type and Rh: 5100
Unit Type and Rh: 5100

## 2018-07-15 ENCOUNTER — Inpatient Hospital Stay (HOSPITAL_COMMUNITY): Admission: RE | Admit: 2018-07-15 | Payer: Medicare Other | Source: Ambulatory Visit

## 2018-07-16 ENCOUNTER — Inpatient Hospital Stay (HOSPITAL_COMMUNITY): Payer: Medicare Other

## 2018-07-16 ENCOUNTER — Encounter (HOSPITAL_COMMUNITY): Payer: Self-pay

## 2018-07-16 ENCOUNTER — Other Ambulatory Visit: Payer: Self-pay

## 2018-07-16 DIAGNOSIS — N184 Chronic kidney disease, stage 4 (severe): Secondary | ICD-10-CM

## 2018-07-16 DIAGNOSIS — C670 Malignant neoplasm of trigone of bladder: Secondary | ICD-10-CM | POA: Diagnosis not present

## 2018-07-16 LAB — CBC WITH DIFFERENTIAL/PLATELET
BASOS ABS: 0 10*3/uL (ref 0.0–0.1)
Basophils Relative: 0 %
EOS ABS: 0 10*3/uL (ref 0.0–0.7)
EOS PCT: 0 %
HCT: 28 % — ABNORMAL LOW (ref 39.0–52.0)
HEMOGLOBIN: 9 g/dL — AB (ref 13.0–17.0)
Lymphocytes Relative: 7 %
Lymphs Abs: 0.4 10*3/uL — ABNORMAL LOW (ref 0.7–4.0)
MCH: 29.9 pg (ref 26.0–34.0)
MCHC: 32.1 g/dL (ref 30.0–36.0)
MCV: 93 fL (ref 78.0–100.0)
Monocytes Absolute: 0.6 10*3/uL (ref 0.1–1.0)
Monocytes Relative: 10 %
NEUTROS PCT: 83 %
Neutro Abs: 5 10*3/uL (ref 1.7–7.7)
PLATELETS: 214 10*3/uL (ref 150–400)
RBC: 3.01 MIL/uL — AB (ref 4.22–5.81)
RDW: 18.7 % — ABNORMAL HIGH (ref 11.5–15.5)
WBC: 6.1 10*3/uL (ref 4.0–10.5)

## 2018-07-16 LAB — COMPREHENSIVE METABOLIC PANEL
ALBUMIN: 3.2 g/dL — AB (ref 3.5–5.0)
ALK PHOS: 54 U/L (ref 38–126)
ALT: 9 U/L (ref 0–44)
AST: 16 U/L (ref 15–41)
Anion gap: 10 (ref 5–15)
BUN: 49 mg/dL — AB (ref 8–23)
CALCIUM: 8.3 mg/dL — AB (ref 8.9–10.3)
CHLORIDE: 110 mmol/L (ref 98–111)
CO2: 18 mmol/L — ABNORMAL LOW (ref 22–32)
CREATININE: 4.72 mg/dL — AB (ref 0.61–1.24)
GFR calc non Af Amer: 11 mL/min — ABNORMAL LOW (ref >60)
GFR, EST AFRICAN AMERICAN: 13 mL/min — AB (ref >60)
GLUCOSE: 91 mg/dL (ref 70–99)
Potassium: 4.6 mmol/L (ref 3.5–5.1)
SODIUM: 138 mmol/L (ref 135–145)
Total Bilirubin: 0.5 mg/dL (ref 0.3–1.2)
Total Protein: 7.1 g/dL (ref 6.5–8.1)

## 2018-07-16 NOTE — Progress Notes (Signed)
Pt reports having no energy - states it took him 3 hours to get up and get dressed today; per his spouse, it took him 5 hours yesterday to get up and attempt to get himself dressed, but he was never able to get himself dressed and ready.  This is shared with Dr. [name], as well as lab results from today.  Will defer tx x 1 week per MD. Pt and spouse notified and are in agreement with this plan.  Instructed pt to call the clinic the day before his scheduled tx next week to let us know if his energy level has improved.

## 2018-07-22 ENCOUNTER — Ambulatory Visit (HOSPITAL_COMMUNITY): Payer: Medicare Other | Admitting: Hematology

## 2018-07-22 ENCOUNTER — Ambulatory Visit (HOSPITAL_COMMUNITY): Payer: Medicare Other

## 2018-07-22 ENCOUNTER — Other Ambulatory Visit (HOSPITAL_COMMUNITY): Payer: Medicare Other

## 2018-07-24 ENCOUNTER — Inpatient Hospital Stay (HOSPITAL_COMMUNITY)
Admission: EM | Admit: 2018-07-24 | Discharge: 2018-07-26 | DRG: 698 | Discharge status: Against Medical Advice | Payer: Medicare Other | Attending: Internal Medicine | Admitting: Internal Medicine

## 2018-07-24 ENCOUNTER — Other Ambulatory Visit: Payer: Self-pay

## 2018-07-24 ENCOUNTER — Encounter (HOSPITAL_COMMUNITY): Payer: Self-pay | Admitting: *Deleted

## 2018-07-24 ENCOUNTER — Emergency Department (HOSPITAL_COMMUNITY): Payer: Medicare Other

## 2018-07-24 DIAGNOSIS — J309 Allergic rhinitis, unspecified: Secondary | ICD-10-CM | POA: Diagnosis present

## 2018-07-24 DIAGNOSIS — H9190 Unspecified hearing loss, unspecified ear: Secondary | ICD-10-CM | POA: Diagnosis present

## 2018-07-24 DIAGNOSIS — Z888 Allergy status to other drugs, medicaments and biological substances status: Secondary | ICD-10-CM | POA: Diagnosis not present

## 2018-07-24 DIAGNOSIS — D62 Acute posthemorrhagic anemia: Secondary | ICD-10-CM | POA: Diagnosis present

## 2018-07-24 DIAGNOSIS — N135 Crossing vessel and stricture of ureter without hydronephrosis: Secondary | ICD-10-CM

## 2018-07-24 DIAGNOSIS — E872 Acidosis, unspecified: Secondary | ICD-10-CM | POA: Diagnosis present

## 2018-07-24 DIAGNOSIS — I130 Hypertensive heart and chronic kidney disease with heart failure and stage 1 through stage 4 chronic kidney disease, or unspecified chronic kidney disease: Secondary | ICD-10-CM | POA: Diagnosis present

## 2018-07-24 DIAGNOSIS — Z9181 History of falling: Secondary | ICD-10-CM | POA: Diagnosis not present

## 2018-07-24 DIAGNOSIS — T83012A Breakdown (mechanical) of nephrostomy catheter, initial encounter: Secondary | ICD-10-CM | POA: Diagnosis present

## 2018-07-24 DIAGNOSIS — N179 Acute kidney failure, unspecified: Secondary | ICD-10-CM | POA: Diagnosis present

## 2018-07-24 DIAGNOSIS — N136 Pyonephrosis: Secondary | ICD-10-CM | POA: Diagnosis present

## 2018-07-24 DIAGNOSIS — E43 Unspecified severe protein-calorie malnutrition: Secondary | ICD-10-CM | POA: Diagnosis present

## 2018-07-24 DIAGNOSIS — Z7951 Long term (current) use of inhaled steroids: Secondary | ICD-10-CM | POA: Diagnosis not present

## 2018-07-24 DIAGNOSIS — T83022A Displacement of nephrostomy catheter, initial encounter: Principal | ICD-10-CM | POA: Diagnosis present

## 2018-07-24 DIAGNOSIS — N1330 Unspecified hydronephrosis: Secondary | ICD-10-CM | POA: Diagnosis present

## 2018-07-24 DIAGNOSIS — J449 Chronic obstructive pulmonary disease, unspecified: Secondary | ICD-10-CM | POA: Diagnosis present

## 2018-07-24 DIAGNOSIS — C7989 Secondary malignant neoplasm of other specified sites: Secondary | ICD-10-CM | POA: Diagnosis present

## 2018-07-24 DIAGNOSIS — F419 Anxiety disorder, unspecified: Secondary | ICD-10-CM | POA: Diagnosis present

## 2018-07-24 DIAGNOSIS — I509 Heart failure, unspecified: Secondary | ICD-10-CM | POA: Diagnosis present

## 2018-07-24 DIAGNOSIS — F17210 Nicotine dependence, cigarettes, uncomplicated: Secondary | ICD-10-CM | POA: Diagnosis present

## 2018-07-24 DIAGNOSIS — R310 Gross hematuria: Secondary | ICD-10-CM | POA: Diagnosis present

## 2018-07-24 DIAGNOSIS — M1990 Unspecified osteoarthritis, unspecified site: Secondary | ICD-10-CM | POA: Diagnosis present

## 2018-07-24 DIAGNOSIS — Z720 Tobacco use: Secondary | ICD-10-CM | POA: Diagnosis present

## 2018-07-24 DIAGNOSIS — I9589 Other hypotension: Secondary | ICD-10-CM | POA: Diagnosis present

## 2018-07-24 DIAGNOSIS — R319 Hematuria, unspecified: Secondary | ICD-10-CM | POA: Insufficient documentation

## 2018-07-24 DIAGNOSIS — T83098A Other mechanical complication of other indwelling urethral catheter, initial encounter: Secondary | ICD-10-CM | POA: Diagnosis not present

## 2018-07-24 DIAGNOSIS — N3289 Other specified disorders of bladder: Secondary | ICD-10-CM | POA: Diagnosis present

## 2018-07-24 DIAGNOSIS — E861 Hypovolemia: Secondary | ICD-10-CM | POA: Diagnosis present

## 2018-07-24 DIAGNOSIS — N184 Chronic kidney disease, stage 4 (severe): Secondary | ICD-10-CM | POA: Diagnosis present

## 2018-07-24 DIAGNOSIS — F329 Major depressive disorder, single episode, unspecified: Secondary | ICD-10-CM | POA: Diagnosis present

## 2018-07-24 DIAGNOSIS — Y732 Prosthetic and other implants, materials and accessory gastroenterology and urology devices associated with adverse incidents: Secondary | ICD-10-CM | POA: Diagnosis present

## 2018-07-24 DIAGNOSIS — F17200 Nicotine dependence, unspecified, uncomplicated: Secondary | ICD-10-CM | POA: Diagnosis present

## 2018-07-24 DIAGNOSIS — R58 Hemorrhage, not elsewhere classified: Secondary | ICD-10-CM | POA: Diagnosis present

## 2018-07-24 DIAGNOSIS — Z8249 Family history of ischemic heart disease and other diseases of the circulatory system: Secondary | ICD-10-CM | POA: Diagnosis not present

## 2018-07-24 DIAGNOSIS — Z825 Family history of asthma and other chronic lower respiratory diseases: Secondary | ICD-10-CM | POA: Diagnosis not present

## 2018-07-24 DIAGNOSIS — C670 Malignant neoplasm of trigone of bladder: Secondary | ICD-10-CM | POA: Diagnosis present

## 2018-07-24 DIAGNOSIS — N189 Chronic kidney disease, unspecified: Secondary | ICD-10-CM | POA: Diagnosis present

## 2018-07-24 DIAGNOSIS — Z6825 Body mass index (BMI) 25.0-25.9, adult: Secondary | ICD-10-CM

## 2018-07-24 DIAGNOSIS — R531 Weakness: Secondary | ICD-10-CM

## 2018-07-24 LAB — CBC WITH DIFFERENTIAL/PLATELET
BASOS PCT: 0 %
Basophils Absolute: 0 10*3/uL (ref 0.0–0.1)
EOS ABS: 0 10*3/uL (ref 0.0–0.7)
EOS PCT: 0 %
HCT: 15.2 % — ABNORMAL LOW (ref 39.0–52.0)
Hemoglobin: 5.1 g/dL — CL (ref 13.0–17.0)
LYMPHS ABS: 0.4 10*3/uL — AB (ref 0.7–4.0)
Lymphocytes Relative: 8 %
MCH: 30.7 pg (ref 26.0–34.0)
MCHC: 33.6 g/dL (ref 30.0–36.0)
MCV: 91.6 fL (ref 78.0–100.0)
MONOS PCT: 9 %
Monocytes Absolute: 0.4 10*3/uL (ref 0.1–1.0)
Neutro Abs: 4 10*3/uL (ref 1.7–7.7)
Neutrophils Relative %: 83 %
Platelets: 186 10*3/uL (ref 150–400)
RBC: 1.66 MIL/uL — ABNORMAL LOW (ref 4.22–5.81)
RDW: 19 % — AB (ref 11.5–15.5)
WBC: 4.8 10*3/uL (ref 4.0–10.5)

## 2018-07-24 LAB — COMPREHENSIVE METABOLIC PANEL
ALBUMIN: 2.7 g/dL — AB (ref 3.5–5.0)
ALK PHOS: 55 U/L (ref 38–126)
ALT: 9 U/L (ref 0–44)
AST: 14 U/L — ABNORMAL LOW (ref 15–41)
Anion gap: 9 (ref 5–15)
BUN: 64 mg/dL — ABNORMAL HIGH (ref 8–23)
CALCIUM: 7.6 mg/dL — AB (ref 8.9–10.3)
CHLORIDE: 111 mmol/L (ref 98–111)
CO2: 18 mmol/L — AB (ref 22–32)
Creatinine, Ser: 5.52 mg/dL — ABNORMAL HIGH (ref 0.61–1.24)
GFR calc non Af Amer: 9 mL/min — ABNORMAL LOW (ref >60)
GFR, EST AFRICAN AMERICAN: 11 mL/min — AB (ref >60)
Glucose, Bld: 94 mg/dL (ref 70–99)
Potassium: 4.7 mmol/L (ref 3.5–5.1)
SODIUM: 138 mmol/L (ref 135–145)
Total Bilirubin: 0.3 mg/dL (ref 0.3–1.2)
Total Protein: 6.1 g/dL — ABNORMAL LOW (ref 6.5–8.1)

## 2018-07-24 LAB — CBG MONITORING, ED: Glucose-Capillary: 95 mg/dL (ref 70–99)

## 2018-07-24 LAB — PREPARE RBC (CROSSMATCH)

## 2018-07-24 MED ORDER — ACETAMINOPHEN 650 MG RE SUPP: 650.0000 mg | Freq: Four times a day (QID) | RECTAL | Status: DC | PRN

## 2018-07-24 MED ORDER — SODIUM CHLORIDE 0.9% FLUSH: 3.0000 mL | INTRAVENOUS | Status: DC | PRN

## 2018-07-24 MED ORDER — SODIUM CHLORIDE 0.9% IV SOLUTION: Freq: Once | INTRAVENOUS | Status: DC

## 2018-07-24 MED ORDER — ACETAMINOPHEN 325 MG PO TABS
650.0000 mg | ORAL_TABLET | Freq: Four times a day (QID) | ORAL | Status: DC | PRN
  Administered 2018-07-25 – 2018-07-26 (×3): 650 mg via ORAL
  Filled 2018-07-24 (×3): qty 2

## 2018-07-24 MED ORDER — ONDANSETRON HCL 4 MG PO TABS: 4.0000 mg | ORAL_TABLET | Freq: Four times a day (QID) | ORAL | Status: DC | PRN

## 2018-07-24 MED ORDER — CITALOPRAM HYDROBROMIDE 10 MG PO TABS
20.0000 mg | ORAL_TABLET | Freq: Every day | ORAL | Status: DC
  Administered 2018-07-25 – 2018-07-26 (×2): 20 mg via ORAL
  Filled 2018-07-24 (×2): qty 2

## 2018-07-24 MED ORDER — NICOTINE 21 MG/24HR TD PT24
21.0000 mg | MEDICATED_PATCH | Freq: Every day | TRANSDERMAL | Status: DC
  Administered 2018-07-24 – 2018-07-26 (×3): 21 mg via TRANSDERMAL
  Filled 2018-07-24 (×3): qty 1

## 2018-07-24 MED ORDER — SODIUM CHLORIDE 0.9 % IV SOLN
INTRAVENOUS | Status: DC
  Administered 2018-07-24 – 2018-07-25 (×3): via INTRAVENOUS
  Administered 2018-07-26: 1000 mL via INTRAVENOUS

## 2018-07-24 MED ORDER — ONDANSETRON HCL 4 MG/2ML IJ SOLN: 4.0000 mg | Freq: Four times a day (QID) | INTRAMUSCULAR | Status: DC | PRN

## 2018-07-24 MED ORDER — ENSURE ENLIVE PO LIQD: 237.0000 mL | Freq: Two times a day (BID) | ORAL | Status: DC

## 2018-07-24 MED ORDER — SODIUM CHLORIDE 0.9 % IV BOLUS
1000.0000 mL | Freq: Once | INTRAVENOUS | Status: AC
  Administered 2018-07-24: 1000 mL via INTRAVENOUS

## 2018-07-24 MED ORDER — IPRATROPIUM-ALBUTEROL 0.5-2.5 (3) MG/3ML IN SOLN: 3.0000 mL | Freq: Four times a day (QID) | RESPIRATORY_TRACT | Status: DC | PRN

## 2018-07-24 MED ORDER — SODIUM CHLORIDE 0.9% FLUSH
3.0000 mL | Freq: Two times a day (BID) | INTRAVENOUS | Status: DC
  Administered 2018-07-25 (×2): 3 mL via INTRAVENOUS

## 2018-07-24 MED ORDER — SODIUM CHLORIDE 0.9 % IV SOLN: 250.0000 mL | INTRAVENOUS | Status: DC | PRN

## 2018-07-24 MED ORDER — SODIUM CHLORIDE 0.9% IV SOLUTION
Freq: Once | INTRAVENOUS | Status: AC
  Administered 2018-07-24: 16:00:00 via INTRAVENOUS

## 2018-07-24 MED ORDER — FLUTICASONE PROPIONATE 50 MCG/ACT NA SUSP: 2.0000 | Freq: Every day | NASAL | Status: DC | PRN

## 2018-07-24 NOTE — ED Triage Notes (Signed)
Pt bib family and was recommend by his doctors to be evaluated. Pt has bilateral nephrostomy tubes x 4 months.  When pt has been getting bladder spasms, pt has been urinating blood and having blood clots x 1.5 months.  Family reports the right nephrostomy tube has come out.  Hx bladder CA and COPD.  Last chemo was 3 weeks ago.  Pt has been getting blood transfusion at the [facility] center in [place].

## 2018-07-24 NOTE — ED Notes (Signed)
ED TO INPATIENT HANDOFF REPORT  Name/Age/Gender Carl Murphy 72 y.o. male  Code Status    Code Status Orders  (From admission, onward)         Start     Ordered   07/24/18 1641  Limited resuscitation (code)  Continuous    Question Answer Comment  In the event of cardiac or respiratory ARREST: Initiate Code Blue, Call Rapid Response Yes   In the event of cardiac or respiratory ARREST: Perform CPR Yes   In the event of cardiac or respiratory ARREST: Perform Intubation/Mechanical Ventilation No   In the event of cardiac or respiratory ARREST: Use NIPPV/BiPAp only if indicated Yes   In the event of cardiac or respiratory ARREST: Administer ACLS medications if indicated Yes   In the event of cardiac or respiratory ARREST: Perform Defibrillation or Cardioversion if indicated Yes      07/24/18 1641        Code Status History    Date Active Date Inactive Code Status Order ID Comments User Context   05/07/2017 1714 05/12/2017 1631 Full Code [id number]  Samuella Cota, MD Inpatient      Home/SNF/Other Home  Chief Complaint (Ca pt) hypoglycemia; loose nephrostomy tube   Level of Care/Admitting Diagnosis ED Disposition    ED Disposition Condition [place]: [place] [100102]  Level of Care: Stepdown [14]  Admit to SDU based on following criteria: Hemodynamic compromise or significant risk of instability:  Patient requiring short term acute titration and management of vasoactive drips, and invasive monitoring (i.e., CVP and Arterial line).  Diagnosis: Acute blood loss anemia [803212]  Admitting Physician: [place], [place]  Attending Physician: Debbe Odea [[id number]]  Estimated length of stay: past midnight tomorrow  Certification:: I certify this patient will need inpatient services for at least 2 midnights  PT Class (Do Not Modify): Inpatient [101]  PT Acc Code (Do Not Modify): Private [1]       Medical History Past  Medical History:  Diagnosis Date  . Arthritis   . Cancer [facility])    Bladder Cancer  . CHF (congestive heart failure) ([place])   . COPD (chronic obstructive pulmonary disease) ([place])   . Depression   . Hard of hearing    bilat   . Hypertension   . Malignant neoplasm of trigone of bladder ([place]) 03/25/2016  . PONV (postoperative nausea and vomiting)   . Shortness of breath dyspnea    heat; walking up set of stairs; pt states can walk up 12-14 stairs slowly without having to stop to catch his breath    Allergies Allergies  Allergen Reactions  . Carboplatin Shortness Of Breath    Reaction occurred after patient left Cancer Center. Pt reported extreme sob after leaving [facility] that eventually resolved on its own.    IV Location/Drains/Wounds Patient Lines/Drains/Airways Status   Active Line/Drains/Airways    Name:   Placement date:   Placement time:   Site:   Days:   Implanted Port Right Chest   -    -    Chest      Nephrostomy Right 10 Fr.   05/20/18    1448    Right   65   Nephrostomy Left 10 Fr.   05/20/18    1449    Left   65   Urethral Catheter Dr. Diona Fanti Coude 24 Fr.   05/11/17    1857    Coude   439  Urethral Catheter Dr. Diona Fanti Latex;Straight-tip;Double-lumen 20 Fr.   04/01/18    1155    Latex;Straight-tip;Double-lumen   114   Airway   -    -        Incision (Closed) 03/25/16 Penis Other (Comment)   03/25/16    1423     851   Incision (Closed) 12/16/16 Perineum Other (Comment)   12/16/16    0823     585   Incision (Closed) 05/11/17 Penis Other (Comment)   05/11/17    1824     439   Incision (Closed) 04/01/18 Penis Other (Comment)   04/01/18    1144     114          Labs/Imaging Results for orders placed or performed during the hospital encounter of 07/24/18 (from the past 48 hour(s))  CBG monitoring, ED     Status: None   Collection Time: 07/24/18  1:39 PM  Result Value Ref Range   Glucose-Capillary 95 70 - 99 mg/dL  CBC with Differential     Status: Abnormal    Collection Time: 07/24/18  2:21 PM  Result Value Ref Range   WBC 4.8 4.0 - 10.5 K/uL   RBC 1.66 (L) 4.22 - 5.81 MIL/uL   Hemoglobin 5.1 (LL) 13.0 - 17.0 g/dL    Comment: CRITICAL RESULT CALLED TO, READ BACK BY AND VERIFIED WITH: PAI,M RN [phone] [id number] C[place],N    HCT 15.2 (L) 39.0 - 52.0 %   MCV 91.6 78.0 - 100.0 fL   MCH 30.7 26.0 - 34.0 pg   MCHC 33.6 30.0 - 36.0 g/dL   RDW 19.0 (H) 11.5 - 15.5 %   Platelets 186 150 - 400 K/uL   Neutrophils Relative % 83 %   Neutro Abs 4.0 1.7 - 7.7 K/uL   Lymphocytes Relative 8 %   Lymphs Abs 0.4 (L) 0.7 - 4.0 K/uL   Monocytes Relative 9 %   Monocytes Absolute 0.4 0.1 - 1.0 K/uL   Eosinophils Relative 0 %   Eosinophils Absolute 0.0 0.0 - 0.7 K/uL   Basophils Relative 0 %   Basophils Absolute 0.0 0.0 - 0.1 K/uL    Comment: Performed at [facility], [place] [address] [place], [place] [address]  Comprehensive metabolic panel     Status: Abnormal   Collection Time: 07/24/18  2:21 PM  Result Value Ref Range   Sodium 138 135 - 145 mmol/L   Potassium 4.7 3.5 - 5.1 mmol/L   Chloride 111 98 - 111 mmol/L   CO2 18 (L) 22 - 32 mmol/L   Glucose, Bld 94 70 - 99 mg/dL   BUN 64 (H) 8 - 23 mg/dL   Creatinine, Ser 5.52 (H) 0.61 - 1.24 mg/dL   Calcium 7.6 (L) 8.9 - 10.3 mg/dL   Total Protein 6.1 (L) 6.5 - 8.1 g/dL   Albumin 2.7 (L) 3.5 - 5.0 g/dL   AST 14 (L) 15 - 41 U/L   ALT 9 0 - 44 U/L   Alkaline Phosphatase 55 38 - 126 U/L   Total Bilirubin 0.3 0.3 - 1.2 mg/dL   GFR calc non Af Amer 9 (L) >60 mL/min   GFR calc Af Amer 11 (L) >60 mL/min    Comment: (NOTE) The eGFR has been calculated using the CKD EPI equation. This calculation has not been validated in all clinical situations. eGFR's persistently <60 mL/min signify possible Chronic Kidney Disease.    Anion gap 9 5 - 15  Comment: Performed at [facility], [place] [address] [place], [place] [address]  Type and screen     Status: None (Preliminary result)    Collection Time: 07/24/18  3:10 PM  Result Value Ref Range   ABO/RH(D) O POS    Antibody Screen NEG    Sample Expiration 07/27/2018    Unit Number [id number]    Blood Component Type RED CELLS,LR    Unit division 00    Status of Unit ISSUED    Unit tag comment VERBAL ORDERS PER DR MESSNER    Transfusion Status OK TO TRANSFUSE    Crossmatch Result COMPATIBLE    Unit Number [id number]    Blood Component Type RED CELLS,LR    Unit division 00    Status of Unit ALLOCATED    Transfusion Status OK TO TRANSFUSE    Crossmatch Result Compatible    Unit Number [id number]    Blood Component Type RBC LR PHER2    Unit division 00    Status of Unit ALLOCATED    Transfusion Status OK TO TRANSFUSE    Crossmatch Result Compatible   Prepare RBC     Status: None   Collection Time: 07/24/18  3:10 PM  Result Value Ref Range   Order Confirmation      ORDER PROCESSED BY BLOOD BANK Performed at [facility], [place] [address], [place], [place] [address]   Prepare RBC     Status: None   Collection Time: 07/24/18  4:00 PM  Result Value Ref Range   Order Confirmation      ORDER PROCESSED BY BLOOD BANK Performed at [place] [address] [place], [place] [address]    Dg Abdomen 1 View  Result Date: 07/24/2018 CLINICAL DATA:  Urinating blood. Patient reports right nephrostomy tube has come out. EXAM: ABDOMEN - 1 VIEW COMPARISON:  CT scan June 07, 2018 FINDINGS: The right nephrostomy tube history ane Jan configuration. There is now a kink in the distal tube suggesting it is at least partially pulled out. The left nephrostomy tube appears to be in expected location. No other acute abnormalities. IMPRESSION: Findings suggesting the right nephrostomy tube has been at least partially pulled out of the renal collecting system. Electronically Signed   By: Dorise Bullion III M.D   On: 07/24/2018 16:05   Dg Chest Port 1 View  Result Date: 07/24/2018 CLINICAL  DATA:  Bladder cancer metastatic the lungs. EXAM: PORTABLE CHEST 1 VIEW COMPARISON:  CT scan June 07, 2018 FINDINGS: The known mass in the left upper lobe is again identified. Other smaller nodules on the recent CT scan are not appreciated on this study. A right Port-A-Cath is stable. No pneumothorax. Mild cardiomegaly. No other interval changes. IMPRESSION: Known mass in the left upper lobe. Other known pulmonary metastases not well visualized on this chest x-ray. No acute abnormalities. Electronically Signed   By: Dorise Bullion III M.D   On: 07/24/2018 16:01    Pending Labs Unresulted Labs (From admission, onward)    Start     Ordered   07/25/18 5053  Basic metabolic panel  Tomorrow morning,   R     07/24/18 1641   07/25/18 0500  CBC  Tomorrow morning,   R     07/24/18 1641   07/24/18 1641  HIV antibody (Routine Testing)  Once,   R     07/24/18 1641   07/24/18 1448  Urine culture  STAT,   STAT  07/24/18 1447   07/24/18 1411  Urinalysis, Routine w reflex microscopic  Once,   R     07/24/18 1410          Vitals/Pain Today's Vitals   07/24/18 1627 07/24/18 1639 07/24/18 1650 07/24/18 1706  BP: (!) 88/48 (!) 93/49 (!) 87/47 (!) 102/52  Pulse: (!) 45 (!) 48 (!) 46 (!) 48  Resp: 16 16 15 16   Temp:      TempSrc:      SpO2: 92% 91% 90% 92%  Weight:      Height:      PainSc:        Isolation Precautions No active isolations  Medications Medications  nicotine (NICODERM CQ - dosed in mg/24 hours) patch 21 mg (has no administration in time range)  0.9 %  sodium chloride infusion (Manually program via Guardrails IV Fluids) ( Intravenous Not Given 07/24/18 1552)  sodium chloride flush (NS) 0.9 % injection 3 mL (has no administration in time range)  sodium chloride flush (NS) 0.9 % injection 3 mL (has no administration in time range)  0.9 %  sodium chloride infusion (has no administration in time range)  ondansetron (ZOFRAN) tablet 4 mg (has no administration in time range)    Or   ondansetron (ZOFRAN) injection 4 mg (has no administration in time range)  acetaminophen (TYLENOL) tablet 650 mg (has no administration in time range)    Or  acetaminophen (TYLENOL) suppository 650 mg (has no administration in time range)  citalopram (CELEXA) tablet 20 mg (has no administration in time range)  ipratropium-albuterol (DUONEB) 0.5-2.5 (3) MG/3ML nebulizer solution 3 mL (has no administration in time range)  fluticasone (FLONASE) 50 MCG/ACT nasal spray 2 spray (has no administration in time range)  ENSURE liquid 237 mL (has no administration in time range)  sodium chloride 0.9 % bolus 1,000 mL (0 mLs Intravenous Stopped 07/24/18 1541)  0.9 %  sodium chloride infusion (Manually program via Guardrails IV Fluids) ( Intravenous New Bag/Given 07/24/18 1628)    Mobility walks

## 2018-07-24 NOTE — H&P (Addendum)
History and Physical    Carl Murphy  IDP:[id number]  DOB: [birth year]  DOA: 07/24/2018 PCP: [name], MD   Patient coming from: home  Chief Complaint: weak, urinating clots  HPI: Carl Murphy is a 72 y.o. male with medical history of bladder cancer with nephrostomy tubes, COPD, smoker, HTN who is hard of hearing and presents at the encouragement of his family for feeling very weak and urinating clots. He has been urinating blood for about 1 month now and his urologists are aware and have been treating but "scraping his bladder". However, this past week, the daughter states, his bleeding has been worse and he is passing clots. The patient was asked to come to the ED by his Urologist office earlier this week but he did not. Today he is noted to have a Hb of 5.1 and SBP in 80s and is being admitted for transfusions.  When inquiring about presyncope or syncope, he denies it but does tell me he has been falling. He fell earlier this week because he "became too weak" and had a fall a few weeks ago.  He also has had urostomy tubes for 2 months now. The bag has fallen off of his right tube and they have not noted much urine in the bag of the left tube.  Of note, while I am examining him and the tubes, I not that there is urine leaking out from the right tubes as the bed is soaked under it. I also note a significant amount of urine on the bed under his left leg where his urostomy bag is attached to his leg. The bag is empty.   ED Course: Hb 5.1, Cr 5.52  Review of Systems:  He has had a very poor appetite, sleeps about 20 hrs a day and has lost " a lot" of weight.  All other systems reviewed and apart from HPI, are negative.  Past Medical History:  Diagnosis Date  . Arthritis   . Cancer [facility])    Bladder Cancer  . CHF (congestive heart failure) ([place])   . COPD (chronic obstructive pulmonary disease) ([place])   . Depression   . Hard of hearing    bilat   . Hypertension   . Malignant  neoplasm of trigone of bladder ([place]) 03/25/2016  . PONV (postoperative nausea and vomiting)   . Shortness of breath dyspnea    heat; walking up set of stairs; pt states can walk up 12-14 stairs slowly without having to stop to catch his breath    Past Surgical History:  Procedure Laterality Date  . CYSTOSCOPY N/A 06/16/2016   Procedure: CYSTOSCOPY;  Surgeon: Franchot Gallo, MD;  Location: [facility];  Service: Urology;  Laterality: N/A;  . IR FLUORO GUIDE PORT INSERTION RIGHT  06/18/2017  . IR NEPHROSTOMY EXCHANGE LEFT  05/20/2018  . IR NEPHROSTOMY EXCHANGE RIGHT  05/20/2018  . IR NEPHROSTOMY PLACEMENT LEFT  03/25/2018  . IR NEPHROSTOMY PLACEMENT RIGHT  03/25/2018  . IR US GUIDE VASC ACCESS RIGHT  06/18/2017  . TRANSURETHRAL RESECTION OF BLADDER TUMOR N/A 03/25/2016   Procedure: TRANSURETHRAL RESECTION OF BLADDER TUMOR (TURBT);  Surgeon: Franchot Gallo, MD;  Location: [facility];  Service: Urology;  Laterality: N/A;  . TRANSURETHRAL RESECTION OF BLADDER TUMOR N/A 06/16/2016   Procedure: TRANSURETHRAL RESECTION OF BLADDER TUMOR (TURBT);  Surgeon: Franchot Gallo, MD;  Location: [facility];  Service: Urology;  Laterality: N/A;  . TRANSURETHRAL RESECTION OF BLADDER TUMOR N/A 12/16/2016   Procedure: TRANSURETHRAL RESECTION OF  BLADDER TUMOR (TURBT);  Surgeon: Franchot Gallo, MD;  Location: [facility];  Service: Urology;  Laterality: N/A;  . TRANSURETHRAL RESECTION OF BLADDER TUMOR N/A 05/11/2017   Procedure: TRANSURETHRAL RESECTION OF BLADDER TUMOR (TURBT) CLOT EVACUATION;  Surgeon: Franchot Gallo, MD;  Location: [facility];  Service: Urology;  Laterality: N/A;  [place] BLUE MEDICARE-[id number]  . TRANSURETHRAL RESECTION OF BLADDER TUMOR N/A 04/01/2018   Procedure: TRANSURETHRAL RESECTION OF BLADDER TUMOR (TURBT);  Surgeon: Franchot Gallo, MD;  Location: [facility];  Service: Urology;  Laterality: N/A;  1 HR [phone] BLUE MEDICARE-[id number]    Social History:   reports that he has  been smoking cigarettes. He has a 100.00 pack-year smoking history. He has never used smokeless tobacco. He reports that he drank alcohol. He reports that he does not use drugs.  Allergies  Allergen Reactions  . Carboplatin Shortness Of Breath    Reaction occurred after patient left Cancer Center. Pt reported extreme sob after leaving CC that eventually resolved on its own.    Family History  Problem Relation Age of Onset  . Hypertension Mother   . Asthma Father   . Emphysema Father      Prior to Admission medications   Medication Sig Start Date End Date Taking? Authorizing Provider  acetaminophen (TYLENOL) 500 MG tablet Take 1,000 mg by mouth daily.    Yes [provider]  amLODipine (NORVASC) 5 MG tablet Take 5 mg by mouth daily.  04/09/18  Yes [provider]  carvedilol (COREG) 25 MG tablet Take 25 mg by mouth 2 (two) times daily.  01/31/16  Yes [provider]  cetirizine (ZYRTEC) 10 MG tablet Take 10 mg by mouth daily.   Yes [provider]  citalopram (CELEXA) 20 MG tablet Take 20 mg by mouth daily.  01/31/16  Yes [provider]  ENSURE (ENSURE) Take 237 mLs by mouth once a week.   Yes [provider]  fluticasone (FLONASE) 50 MCG/ACT nasal spray Place 2 sprays into both nostrils daily as needed for allergies or rhinitis.   Yes [provider]  ipratropium-albuterol (DUONEB) 0.5-2.5 (3) MG/3ML SOLN Take 3 mLs by nebulization every 6 (six) hours as needed. 06/18/18  Yes Derek Jack, MD  lidocaine (XYLOCAINE) 2 % solution Use as directed 15 mLs in the mouth or throat every 6 (six) hours as needed for mouth pain.  07/07/18  Yes [provider]  VENTOLIN HFA 108 (90 Base) MCG/ACT inhaler Inhale 1-2 puffs into the lungs every 4 (four) hours as needed for wheezing or shortness of breath.  04/27/18  Yes [provider]  oxybutynin (DITROPAN) 5 MG tablet Take 1 tablet (5 mg total) by mouth every 6 (six) hours  as needed for bladder spasms. Patient not taking: Reported on 07/24/2018 05/12/17   Cleon Gustin, MD  sucralfate (CARAFATE) 1 GM/10ML suspension Carafate 1gm/5ml and Viscous lidocaine 2% 1:1 mixture. Swish and swallow 1 tablespoon four times a day Patient not taking: Reported on 07/24/2018 07/02/18   Derek Jack, MD    Physical Exam: Wt Readings from Last 3 Encounters:  07/24/18 74.8 kg  07/16/18 74.5 kg  07/09/18 76.7 kg   Vitals:   07/24/18 1627 07/24/18 1639 07/24/18 1650 07/24/18 1706  BP: (!) 88/48 (!) 93/49 (!) 87/47 (!) 102/52  Pulse: (!) 45 (!) 48 (!) 46 (!) 48  Resp: 16 16 15 16   Temp:      TempSrc:      SpO2: 92% 91% 90% 92%  Weight:      Height:          Constitutional:  Calm & comfortable Eyes: PERRLA, lids and conjunctivae pale ENT:  Mucous membranes are very dry Pharynx clear of exudate   Neck: Supple, no masses  Respiratory:  Mild rhonchi bilaterally Normal respiratory effort.  Cardiovascular:  S1 & S2 heard, regular rate and rhythm No Murmurs Abdomen:  Non distended No tenderness, No masses Bowel sounds normal Extremities:  No clubbing / cyanosis No pedal edema No joint deformity    Skin:  No rashes, lesions or ulcers- many bruises  Neurologic:  AAO x 3 CN 2-12 grossly intact Sensation intact Strength 5/5 in all 4 extremities Psychiatric:  Normal Mood and affect    Labs on Admission: I have personally reviewed following labs and imaging studies  CBC: Recent Labs  Lab 07/24/18 1421  WBC 4.8  NEUTROABS 4.0  HGB 5.1*  HCT 15.2*  MCV 91.6  PLT 355   Basic Metabolic Panel: Recent Labs  Lab 07/24/18 1421  NA 138  K 4.7  CL 111  CO2 18*  GLUCOSE 94  BUN 64*  CREATININE 5.52*  CALCIUM 7.6*   GFR: Estimated Creatinine Clearance: 12 mL/min (A) (by C-G formula based on SCr of 5.52 mg/dL (H)). Liver Function Tests: Recent Labs  Lab 07/24/18 1421  AST 14*  ALT 9  ALKPHOS 55  BILITOT 0.3  PROT 6.1*  ALBUMIN  2.7*   No results for input(s): LIPASE, AMYLASE in the last 168 hours. No results for input(s): AMMONIA in the last 168 hours. Coagulation Profile: No results for input(s): INR, PROTIME in the last 168 hours. Cardiac Enzymes: No results for input(s): CKTOTAL, CKMB, CKMBINDEX, TROPONINI in the last 168 hours. BNP (last 3 results) No results for input(s): PROBNP in the last 8760 hours. HbA1C: No results for input(s): HGBA1C in the last 72 hours. CBG: Recent Labs  Lab 07/24/18 1339  GLUCAP 95   Lipid Profile: No results for input(s): CHOL, HDL, LDLCALC, TRIG, CHOLHDL, LDLDIRECT in the last 72 hours. Thyroid Function Tests: No results for input(s): TSH, T4TOTAL, FREET4, T3FREE, THYROIDAB in the last 72 hours. Anemia Panel: No results for input(s): VITAMINB12, FOLATE, FERRITIN, TIBC, IRON, RETICCTPCT in the last 72 hours. Urine analysis: No results found for: COLORURINE, APPEARANCEUR, LABSPEC, PHURINE, GLUCOSEU, HGBUR, BILIRUBINUR, KETONESUR, PROTEINUR, UROBILINOGEN, NITRITE, LEUKOCYTESUR Sepsis Labs: @LABRCNTIP (procalcitonin:4,lacticidven:4) )No results found for this or any previous visit (from the past 240 hour(s)).   Radiological Exams on Admission: Dg Abdomen 1 View  Result Date: 07/24/2018 CLINICAL DATA:  Urinating blood. Patient reports right nephrostomy tube has come out. EXAM: ABDOMEN - 1 VIEW COMPARISON:  CT scan June 07, 2018 FINDINGS: The right nephrostomy tube history ane Jan configuration. There is now a kink in the distal tube suggesting it is at least partially pulled out. The left nephrostomy tube appears to be in expected location. No other acute abnormalities. IMPRESSION: Findings suggesting the right nephrostomy tube has been at least partially pulled out of the renal collecting system. Electronically Signed   By: Dorise Bullion III M.D   On: 07/24/2018 16:05   Dg Chest Port 1 View  Result Date: 07/24/2018 CLINICAL DATA:  Bladder cancer metastatic the lungs. EXAM:  PORTABLE CHEST 1 VIEW COMPARISON:  CT scan June 07, 2018 FINDINGS: The known mass in the left upper lobe is again identified. Other smaller nodules on the recent CT scan are not appreciated on this study. A right Port-A-Cath is stable. No pneumothorax.  Mild cardiomegaly. No other interval changes. IMPRESSION: Known mass in the left upper lobe. Other known pulmonary metastases not well visualized on this chest x-ray. No acute abnormalities. Electronically Signed   By: Dorise Bullion III M.D   On: 07/24/2018 16:01      Assessment/Plan Principal Problem:   Acute blood loss anemia/  Hematuria - nephrology has been called and will evaluate the patient - he has been ordered 2 U PRBC to be transfused urgently due to hypotension - further blood has been placed on hold- will assesedd his Hb after the 2 units and decide if he needs more blood tonight  Active Problems: Hypotension - SBP in 80s - very alert, mentation normal - due to acute blood loss in setting of antihypertensive use - hold Norvasc and Coreg - transfuse blood as mentioned  CKD 4 with AKI - hydrate/ replace blood, and follow    Malignant neoplasm of trigone of bladder  - further evaluation from Urology - will likely need nephrostomy tubes changed as they are leaking and are about 1 wk past due for change- will defer decision to Urology      Tobacco abuse - nicotine patch ordered    COPD (chronic obstructive pulmonary disease) - he sounds congested on exam and he states he chronically has a cough- possibly has chronic bronchitis - cont Duonebs  Weight loss/ severe protein calorie malnutrition - will order Ensure and obtain a nutrition eval  Depression/ anxiety - cont Celexa   HOH   DVT prophylaxis: SCDs Code Status: does not want to be intubated but would like CPAP/ BiPAP and cardiac resuscitation   Family Communication: daughter and wife  Disposition Plan: SDU  Consults called: Urology called by ED  Admission  status: inpatient     Debbe Odea MD [facility] Pager: [email or web address] Password [email or web address] 7PM-7AM, please contact night-coverage   07/24/2018, 5:15 PM

## 2018-07-24 NOTE — ED Provider Notes (Signed)
Emergency Department Provider Note   I have reviewed the triage vital signs and the nursing notes.   HISTORY  Chief Complaint Anemia   HPI Carl Murphy is a 72 y.o. male with multiple medical problems but most notably for known bladder cancer that has been known to bleed.  He has bilateral nephrostomy tubes as well.  From the best that I can tell for medical records and family and patient history he recently had a procedure to remove some of the cancer to reduce bleeding back in May.  He has diminished output just in general out of his right nephrostomy tube and it has been hooked up recently.  He also has a left nephrostomy tube is been draining urine but has been diminished over the last couple days.  Over the last couple days he has skin color changes increased weakness and fatigue and dizziness with ambulation and movement.  Is been known to have anemia in the past and thus his doctor sent him here for further evaluation.  Patient has had any fever, nausea or vomiting just generalized weakness.  Had a couple falls but no assistant pain from those. Family states he appears grayer than normal.  No other associated or modifying symptoms.    Past Medical History:  Diagnosis Date  . Arthritis   . Cancer [facility])    Bladder Cancer  . CHF (congestive heart failure) ([place])   . COPD (chronic obstructive pulmonary disease) ([place])   . Depression   . Hard of hearing    bilat   . Hypertension   . Malignant neoplasm of trigone of bladder ([place]) 03/25/2016  . PONV (postoperative nausea and vomiting)   . Shortness of breath dyspnea    heat; walking up set of stairs; pt states can walk up 12-14 stairs slowly without having to stop to catch his breath    Patient Active Problem List   Diagnosis Date Noted  . Hematuria 07/24/2018  . CKD (chronic kidney disease) 05/19/2018  . Productive cough 07/24/2017  . Mucositis oral 07/21/2017  . Acute blood loss anemia 05/07/2017  . Gross hematuria  05/07/2017  . AKI (acute kidney injury) ([place]) 05/07/2017  . COPD (chronic obstructive pulmonary disease) ([place]) 05/07/2017  . Tobacco dependence 05/07/2017  . Tobacco abuse 05/15/2016  . Malignant neoplasm of trigone of bladder ([place]) 03/25/2016    Past Surgical History:  Procedure Laterality Date  . CYSTOSCOPY N/A 06/16/2016   Procedure: CYSTOSCOPY;  Surgeon: Franchot Gallo, MD;  [facility]: [facility];  Service: Urology;  Laterality: N/A;  . IR FLUORO GUIDE PORT INSERTION RIGHT  06/18/2017  . IR NEPHROSTOMY EXCHANGE LEFT  05/20/2018  . IR NEPHROSTOMY EXCHANGE RIGHT  05/20/2018  . IR NEPHROSTOMY PLACEMENT LEFT  03/25/2018  . IR NEPHROSTOMY PLACEMENT RIGHT  03/25/2018  . IR US GUIDE VASC ACCESS RIGHT  06/18/2017  . TRANSURETHRAL RESECTION OF BLADDER TUMOR N/A 03/25/2016   Procedure: TRANSURETHRAL RESECTION OF BLADDER TUMOR (TURBT);  Surgeon: Franchot Gallo, MD;  Location: [facility];  Service: Urology;  Laterality: N/A;  . TRANSURETHRAL RESECTION OF BLADDER TUMOR N/A 06/16/2016   Procedure: TRANSURETHRAL RESECTION OF BLADDER TUMOR (TURBT);  Surgeon: Franchot Gallo, MD;  [facility];  Service: Urology;  Laterality: N/A;  . TRANSURETHRAL RESECTION OF BLADDER TUMOR N/A 12/16/2016   Procedure: TRANSURETHRAL RESECTION OF BLADDER TUMOR (TURBT);  Surgeon: Franchot Gallo, MD;  Location: [facility];  Service: Urology;  Laterality: N/A;  . TRANSURETHRAL RESECTION OF BLADDER TUMOR N/A 05/11/2017   Procedure:  TRANSURETHRAL RESECTION OF BLADDER TUMOR (TURBT) CLOT EVACUATION;  Surgeon: Franchot Gallo, MD;  [facility];  Service: Urology;  Laterality: N/A;  [place] BLUE MEDICARE-[id number]  . TRANSURETHRAL RESECTION OF BLADDER TUMOR N/A 04/01/2018   Procedure: TRANSURETHRAL RESECTION OF BLADDER TUMOR (TURBT);  Surgeon: Franchot Gallo, MD;  Location[facility];  Service: Urology;  Laterality: N/A;  1 HR [phone] BLUE MEDICARE-[id number]      Allergies Carboplatin  Family  History  Problem Relation Age of Onset  . Hypertension Mother   . Asthma Father   . Emphysema Father     Social History Social History   Tobacco Use  . Smoking status: Current Every Day Smoker    Packs/day: 2.00    Years: 50.00    Pack years: 100.00    Types: Cigarettes  . Smokeless tobacco: Never Used  Substance Use Topics  . Alcohol use: Not Currently    Comment: occasionally  . Drug use: No    Review of Systems  All other systems negative except as documented in the HPI. All pertinent positives and negatives as reviewed in the HPI. ____________________________________________   PHYSICAL EXAM:  VITAL SIGNS: ED Triage Vitals  Enc Vitals Group     BP 07/24/18 1345 (!) 95/53     Pulse Rate 07/24/18 1345 (!) 43     Resp 07/24/18 1345 16     Temp 07/24/18 1356 (!) 95 F (35 C)     Temp Source 07/24/18 1356 Rectal     SpO2 07/24/18 1345 94 %     Weight 07/24/18 1346 165 lb (74.8 kg)     Height 07/24/18 1346 5\' 8"  (1.727 m)    Constitutional: Alert and oriented. Chronically and acutely ill-appearing but no acute distress. Eyes: Conjunctivae are pale. PERRL. EOMI. Head: Atraumatic. Nose: No congestion/rhinnorhea. Mouth/Throat: Mucous membranes are dry and pale.  Oropharynx non-erythematous. Neck: No stridor.  No meningeal signs.   Cardiovascular: bradycardic rate, regular rhythm. Good peripheral circulation. Grossly normal heart sounds.   Respiratory: Normal respiratory effort.  No retractions. Lungs CTAB. Gastrointestinal: Soft and nontender. No distention.  Musculoskeletal: No lower extremity tenderness nor edema. No gross deformities of extremities. -bilateral nephrostomy tubes sutured in place still. Neurologic:  Normal speech and language. No gross focal neurologic deficits are appreciated.  Skin:  Skin is warm, dry and intact. Pale. No rash noted.   ____________________________________________   LABS (all labs ordered are listed, but only abnormal  results are displayed)  Labs Reviewed  CBC WITH DIFFERENTIAL/PLATELET - Abnormal; Notable for the following components:      Result Value   RBC 1.66 (*)    Hemoglobin 5.1 (*)    HCT 15.2 (*)    RDW 19.0 (*)    Lymphs Abs 0.4 (*)    All other components within normal limits  COMPREHENSIVE METABOLIC PANEL - Abnormal; Notable for the following components:   CO2 18 (*)    BUN 64 (*)    Creatinine, Ser 5.52 (*)    Calcium 7.6 (*)    Total Protein 6.1 (*)    Albumin 2.7 (*)    AST 14 (*)    GFR calc non Af Amer 9 (*)    GFR calc Af Amer 11 (*)    All other components within normal limits  BASIC METABOLIC PANEL - Abnormal; Notable for the following components:   Chloride 115 (*)    CO2 17 (*)    BUN 58 (*)    Creatinine, Ser  4.84 (*)    Calcium 7.2 (*)    GFR calc non Af Amer 11 (*)    GFR calc Af Amer 13 (*)    All other components within normal limits  CBC - Abnormal; Notable for the following components:   RBC 2.62 (*)    Hemoglobin 8.1 (*)    HCT 23.2 (*)    RDW 17.7 (*)    All other components within normal limits  CBC - Abnormal; Notable for the following components:   RBC 2.45 (*)    Hemoglobin 7.6 (*)    HCT 22.1 (*)    RDW 17.0 (*)    All other components within normal limits  URINE CULTURE  URINE CULTURE  URINALYSIS, ROUTINE W REFLEX MICROSCOPIC  HIV ANTIBODY (ROUTINE TESTING)  URINALYSIS, ROUTINE W REFLEX MICROSCOPIC  CBG MONITORING, ED  TYPE AND SCREEN  PREPARE RBC (CROSSMATCH)  PREPARE RBC (CROSSMATCH)   ____________________________________________  EKG   EKG Interpretation  Date/Time:    Ventricular Rate:    PR Interval:    QRS Duration:   QT Interval:    QTC Calculation:   R Axis:     Text Interpretation:         ____________________________________________  RADIOLOGY  Dg Abdomen 1 View  Result Date: 07/24/2018 CLINICAL DATA:  Urinating blood. Patient reports right nephrostomy tube has come out. EXAM: ABDOMEN - 1 VIEW COMPARISON:   CT scan June 07, 2018 FINDINGS: The right nephrostomy tube history ane Jan configuration. There is now a kink in the distal tube suggesting it is at least partially pulled out. The left nephrostomy tube appears to be in expected location. No other acute abnormalities. IMPRESSION: Findings suggesting the right nephrostomy tube has been at least partially pulled out of the renal collecting system. Electronically Signed   By: Dorise Bullion III M.D   On: 07/24/2018 16:05   Dg Chest Port 1 View  Result Date: 07/24/2018 CLINICAL DATA:  Bladder cancer metastatic the lungs. EXAM: PORTABLE CHEST 1 VIEW COMPARISON:  CT scan June 07, 2018 FINDINGS: The known mass in the left upper lobe is again identified. Other smaller nodules on the recent CT scan are not appreciated on this study. A right Port-A-Cath is stable. No pneumothorax. Mild cardiomegaly. No other interval changes. IMPRESSION: Known mass in the left upper lobe. Other known pulmonary metastases not well visualized on this chest x-ray. No acute abnormalities. Electronically Signed   By: Dorise Bullion III M.D   On: 07/24/2018 16:01    ____________________________________________   PROCEDURES  Procedure(s) performed:   Procedures  CRITICAL CARE Performed by: Merrily Pew Total critical care time: 40 minutes Critical care time was exclusive of separately billable procedures and treating other patients. Critical care was necessary to treat or prevent imminent or life-threatening deterioration. Critical care was time spent personally by me on the following activities: development of treatment plan with patient and/or surrogate as well as nursing, discussions with consultants, evaluation of patient's response to treatment, examination of patient, obtaining history from patient or surrogate, ordering and performing treatments and interventions, ordering and review of laboratory studies, ordering and review of radiographic studies, pulse oximetry and  re-evaluation of patient's condition.  ____________________________________________   INITIAL IMPRESSION / ASSESSMENT AND PLAN / ED COURSE  Patient found to have acute blood loss anemia secondary to this hemorrhagic mass in his bladder.  2 units of release blood were given secondary to his maps being low with this.  I discussed with Dr. Junious Silk  with urology who will see the patient but suggested resuscitation prior to any kind of intervention.  I discussed with triad who will admit.    Pertinent labs & imaging results that were available during my care of the patient were reviewed by me and considered in my medical decision making (see chart for details).  ____________________________________________  FINAL CLINICAL IMPRESSION(S) / ED DIAGNOSES  Final diagnoses:  Anemia due to acute blood loss  Hypotension due to hypovolemia  Nephrostomy tube displaced ([place])     MEDICATIONS GIVEN DURING THIS VISIT:  Medications  nicotine (NICODERM CQ - dosed in mg/24 hours) patch 21 mg (21 mg Transdermal Patch Applied 07/24/18 2116)  0.9 %  sodium chloride infusion (Manually program via Guardrails IV Fluids) ( Intravenous Not Given 07/24/18 1552)  sodium chloride flush (NS) 0.9 % injection 3 mL (3 mLs Intravenous Not Given 07/24/18 2024)  sodium chloride flush (NS) 0.9 % injection 3 mL (has no administration in time range)  0.9 %  sodium chloride infusion (has no administration in time range)  ondansetron (ZOFRAN) tablet 4 mg (has no administration in time range)    Or  ondansetron (ZOFRAN) injection 4 mg (has no administration in time range)  acetaminophen (TYLENOL) tablet 650 mg (has no administration in time range)    Or  acetaminophen (TYLENOL) suppository 650 mg (has no administration in time range)  citalopram (CELEXA) tablet 20 mg (has no administration in time range)  ipratropium-albuterol (DUONEB) 0.5-2.5 (3) MG/3ML nebulizer solution 3 mL (has no administration in time range)    fluticasone (FLONASE) 50 MCG/ACT nasal spray 2 spray (has no administration in time range)  feeding supplement (ENSURE ENLIVE) (ENSURE ENLIVE) liquid 237 mL (has no administration in time range)  0.9 %  sodium chloride infusion ( Intravenous Rate/Dose Verify 07/25/18 0500)  sodium bicarbonate tablet 650 mg (has no administration in time range)  sodium chloride 0.9 % bolus 1,000 mL (0 mLs Intravenous Stopped 07/24/18 1541)  0.9 %  sodium chloride infusion (Manually program via Guardrails IV Fluids) ( Intravenous New Bag/Given 07/24/18 1628)     NEW OUTPATIENT MEDICATIONS STARTED DURING THIS VISIT:  Current Discharge Medication List      Note:  This note was prepared with assistance of Dragon voice recognition software. Occasional wrong-word or sound-a-like substitutions may have occurred due to the inherent limitations of voice recognition software.   Lynsie Mcwatters, Corene Cornea, MD 07/25/18 [phone]

## 2018-07-24 NOTE — ED Notes (Signed)
CRITICAL VALUE STICKER  CRITICAL VALUE: Critical hemoglobin of 5.1  RECEIVER (on-site recipient of call): Carl Murphy, [place] NOTIFIED: 07/24/18 @ 15:03  MESSENGER (representative from lab): Gershon Crane  MD NOTIFIED: Dr. Richardean Chimera  TIME OF NOTIFICATION: 15:04  RESPONSE: Waiting for response

## 2018-07-24 NOTE — Consult Note (Addendum)
Consultation: Gross hematuria, bilateral hydronephrosis Requested by: Dr. Merrily Pew  History of Present Illness: Carl Murphy is a patient of Dr. Diona Fanti with a history of metastatic muscle invasive bladder cancer who underwent palliative TURBT last on May 2019. He has bilateral ureteral obstruction and bilateral nephrostomy tubes. He is currently under the care of Dr. [name] and recently transitioned from [facility] to [name] with [name] support. Patient admitted with hemoglobin of 5.1and creatinine of 5.52 with his baseline around 4.5. The patient has had episodes of urgency and voiding clots over the past 5 weeks which seemed worse last week. Also, his family reports he was due for nephrostomy tube exchange last week. The right bag fell off and the tube is just draining into the bed, the left bag is leaking urine. Both tubes look grimy.  Past Medical History:  Diagnosis Date  . Arthritis   . Cancer [facility])    Bladder Cancer  . CHF (congestive heart failure) ([place])   . COPD (chronic obstructive pulmonary disease) ([place])   . Depression   . Hard of hearing    bilat   . Hypertension   . Malignant neoplasm of trigone of bladder ([place]) 03/25/2016  . PONV (postoperative nausea and vomiting)   . Shortness of breath dyspnea    heat; walking up set of stairs; pt states can walk up 12-14 stairs slowly without having to stop to catch his breath   Past Surgical History:  Procedure Laterality Date  . CYSTOSCOPY N/A 06/16/2016   Procedure: CYSTOSCOPY;  Surgeon: Franchot Gallo, MD;  [facility];  Service: Urology;  Laterality: N/A;  . IR FLUORO GUIDE PORT INSERTION RIGHT  06/18/2017  . IR NEPHROSTOMY EXCHANGE LEFT  05/20/2018  . IR NEPHROSTOMY EXCHANGE RIGHT  05/20/2018  . IR NEPHROSTOMY PLACEMENT LEFT  03/25/2018  . IR NEPHROSTOMY PLACEMENT RIGHT  03/25/2018  . IR US GUIDE VASC ACCESS RIGHT  06/18/2017  . TRANSURETHRAL RESECTION OF BLADDER TUMOR N/A 03/25/2016   Procedure: TRANSURETHRAL  RESECTION OF BLADDER TUMOR (TURBT);  Surgeon: Franchot Gallo, MD;  Location[facility];  Service: Urology;  Laterality: N/A;  . TRANSURETHRAL RESECTION OF BLADDER TUMOR N/A 06/16/2016   Procedure: TRANSURETHRAL RESECTION OF BLADDER TUMOR (TURBT);  Surgeon: Franchot Gallo, MD;  [facility];  Service: Urology;  Laterality: N/A;  . TRANSURETHRAL RESECTION OF BLADDER TUMOR N/A 12/16/2016   Procedure: TRANSURETHRAL RESECTION OF BLADDER TUMOR (TURBT);  Surgeon: Franchot Gallo, MD;  [facility];  Service: Urology;  Laterality: N/A;  . TRANSURETHRAL RESECTION OF BLADDER TUMOR N/A 05/11/2017   Procedure: TRANSURETHRAL RESECTION OF BLADDER TUMOR (TURBT) CLOT EVACUATION;  Surgeon: Franchot Gallo, MD;  [facility];  Service: Urology;  Laterality: N/A;  [place] BLUE MEDICARE-[id number]  . TRANSURETHRAL RESECTION OF BLADDER TUMOR N/A 04/01/2018   Procedure: TRANSURETHRAL RESECTION OF BLADDER TUMOR (TURBT);  Surgeon: Franchot Gallo, MD;  [facility];  Service: Urology;  Laterality: N/A;  1 HR [phone] BLUE MEDICARE-[id number]    Home Medications:   (Not in a hospital admission) Allergies:  Allergies  Allergen Reactions  . Carboplatin Shortness Of Breath    Reaction occurred after patient left [facility]. Pt reported extreme sob after leaving [facility] that eventually resolved on its own.    Family History  Problem Relation Age of Onset  . Hypertension Mother   . Asthma Father   . Emphysema Father    Social History:  reports that he has been smoking cigarettes. He has a 100.00 pack-year smoking history.  He has never used smokeless tobacco. He reports that he drank alcohol. He reports that he does not use drugs.  ROS: A complete review of systems was performed.  All systems are negative except for pertinent findings as noted. Review of Systems  Respiratory: Positive for cough.      Physical Exam:  Vital signs in last 24 hours: Temp:  [95 F (35  C)] 95 F (35 C) (08/24 1356) Pulse Rate:  [43-48] 48 (08/24 1706) Resp:  [15-16] 16 (08/24 1706) BP: (82-102)/(47-53) 102/52 (08/24 1706) SpO2:  [90 %-94 %] 92 % (08/24 1706) Weight:  [74.8 kg] 74.8 kg (08/24 1346) General:  Alert and oriented, No acute distress HEENT: Normocephalic, atraumatic Cardiovascular: Regular rate and rhythm Lungs: Regular rate and effort Abdomen: Soft, nontender, nondistended, no abdominal masses Back: No CVA tenderness, Right nephrostomy draining freely onto bed, left nephrostomy bag leaking. Urine appears clear on both sides.  Extremities: No edema Neurologic: Grossly intact  Laboratory Data:  Results for orders placed or performed during the hospital encounter of 07/24/18 (from the past 24 hour(s))  CBG monitoring, ED     Status: None   Collection Time: 07/24/18  1:39 PM  Result Value Ref Range   Glucose-Capillary 95 70 - 99 mg/dL  CBC with Differential     Status: Abnormal   Collection Time: 07/24/18  2:21 PM  Result Value Ref Range   WBC 4.8 4.0 - 10.5 K/uL   RBC 1.66 (L) 4.22 - 5.81 MIL/uL   Hemoglobin 5.1 (LL) 13.0 - 17.0 g/dL   HCT 15.2 (L) 39.0 - 52.0 %   MCV 91.6 78.0 - 100.0 fL   MCH 30.7 26.0 - 34.0 pg   MCHC 33.6 30.0 - 36.0 g/dL   RDW 19.0 (H) 11.5 - 15.5 %   Platelets 186 150 - 400 K/uL   Neutrophils Relative % 83 %   Neutro Abs 4.0 1.7 - 7.7 K/uL   Lymphocytes Relative 8 %   Lymphs Abs 0.4 (L) 0.7 - 4.0 K/uL   Monocytes Relative 9 %   Monocytes Absolute 0.4 0.1 - 1.0 K/uL   Eosinophils Relative 0 %   Eosinophils Absolute 0.0 0.0 - 0.7 K/uL   Basophils Relative 0 %   Basophils Absolute 0.0 0.0 - 0.1 K/uL  Comprehensive metabolic panel     Status: Abnormal   Collection Time: 07/24/18  2:21 PM  Result Value Ref Range   Sodium 138 135 - 145 mmol/L   Potassium 4.7 3.5 - 5.1 mmol/L   Chloride 111 98 - 111 mmol/L   CO2 18 (L) 22 - 32 mmol/L   Glucose, Bld 94 70 - 99 mg/dL   BUN 64 (H) 8 - 23 mg/dL   Creatinine, Ser 5.52 (H)  0.61 - 1.24 mg/dL   Calcium 7.6 (L) 8.9 - 10.3 mg/dL   Total Protein 6.1 (L) 6.5 - 8.1 g/dL   Albumin 2.7 (L) 3.5 - 5.0 g/dL   AST 14 (L) 15 - 41 U/L   ALT 9 0 - 44 U/L   Alkaline Phosphatase 55 38 - 126 U/L   Total Bilirubin 0.3 0.3 - 1.2 mg/dL   GFR calc non Af Amer 9 (L) >60 mL/min   GFR calc Af Amer 11 (L) >60 mL/min   Anion gap 9 5 - 15  Type and screen     Status: None (Preliminary result)   Collection Time: 07/24/18  3:10 PM  Result Value Ref Range   ABO/RH(D) O POS  Antibody Screen NEG    Sample Expiration 07/27/2018    Unit Number [id number]    Blood Component Type RED CELLS,LR    Unit division 00    Status of Unit ISSUED    Unit tag comment VERBAL ORDERS PER DR MESSNER    Transfusion Status OK TO TRANSFUSE    Crossmatch Result COMPATIBLE    Unit Number [id number]    Blood Component Type RED CELLS,LR    Unit division 00    Status of Unit ALLOCATED    Transfusion Status OK TO TRANSFUSE    Crossmatch Result Compatible    Unit Number [id number]    Blood Component Type RBC LR PHER2    Unit division 00    Status of Unit ALLOCATED    Transfusion Status OK TO TRANSFUSE    Crossmatch Result Compatible   Prepare RBC     Status: None   Collection Time: 07/24/18  3:10 PM  Result Value Ref Range   Order Confirmation      ORDER PROCESSED BY BLOOD BANK Performed at [facility], [place] [address], [place], [place] [address]   Prepare RBC     Status: None   Collection Time: 07/24/18  4:00 PM  Result Value Ref Range   Order Confirmation      ORDER PROCESSED BY BLOOD BANK Performed at [facility] [address], [place], [place] [address]    No results found for this or any previous visit (from the past 240 hour(s)). Creatinine: Recent Labs    07/24/18 1421  CREATININE 5.52*    Impression/Assessment/plan:  1) gross hematuria-patient may need palliative TURBT. Anemia certainly multifactorial. Currently, undergoing  blood transfusion. Will follow.   2) bilateral ureteral obstruction with bilateral nephrostomy tubes-I put in an order for interventional radiology to exchange the tubes. I asked his ED nurse to get a nephrostomy bag next door from radiology to connect to the right tube in the meantime.   Carl Murphy 07/24/2018, 5:10 PM

## 2018-07-25 ENCOUNTER — Inpatient Hospital Stay (HOSPITAL_COMMUNITY): Payer: Medicare Other

## 2018-07-25 DIAGNOSIS — Z720 Tobacco use: Secondary | ICD-10-CM

## 2018-07-25 DIAGNOSIS — D62 Acute posthemorrhagic anemia: Secondary | ICD-10-CM

## 2018-07-25 DIAGNOSIS — I9589 Other hypotension: Secondary | ICD-10-CM

## 2018-07-25 DIAGNOSIS — E872 Acidosis, unspecified: Secondary | ICD-10-CM | POA: Diagnosis present

## 2018-07-25 DIAGNOSIS — N135 Crossing vessel and stricture of ureter without hydronephrosis: Secondary | ICD-10-CM

## 2018-07-25 DIAGNOSIS — J449 Chronic obstructive pulmonary disease, unspecified: Secondary | ICD-10-CM

## 2018-07-25 DIAGNOSIS — T83098A Other mechanical complication of other indwelling urethral catheter, initial encounter: Secondary | ICD-10-CM

## 2018-07-25 DIAGNOSIS — R58 Hemorrhage, not elsewhere classified: Secondary | ICD-10-CM | POA: Diagnosis present

## 2018-07-25 DIAGNOSIS — R310 Gross hematuria: Secondary | ICD-10-CM

## 2018-07-25 DIAGNOSIS — C670 Malignant neoplasm of trigone of bladder: Secondary | ICD-10-CM

## 2018-07-25 DIAGNOSIS — N1330 Unspecified hydronephrosis: Secondary | ICD-10-CM

## 2018-07-25 DIAGNOSIS — T83022A Displacement of nephrostomy catheter, initial encounter: Principal | ICD-10-CM

## 2018-07-25 DIAGNOSIS — N179 Acute kidney failure, unspecified: Secondary | ICD-10-CM

## 2018-07-25 DIAGNOSIS — N184 Chronic kidney disease, stage 4 (severe): Secondary | ICD-10-CM

## 2018-07-25 LAB — CBC
HCT: 23.2 % — ABNORMAL LOW (ref 39.0–52.0)
HEMATOCRIT: 22.1 % — AB (ref 39.0–52.0)
HEMOGLOBIN: 7.6 g/dL — AB (ref 13.0–17.0)
HEMOGLOBIN: 8.1 g/dL — AB (ref 13.0–17.0)
MCH: 30.9 pg (ref 26.0–34.0)
MCH: 31 pg (ref 26.0–34.0)
MCHC: 34.9 g/dL (ref 30.0–36.0)
MCHC: 34.4 g/dL (ref 30.0–36.0)
MCV: 88.5 fL (ref 78.0–100.0)
MCV: 90.2 fL (ref 78.0–100.0)
Platelets: 174 10*3/uL (ref 150–400)
Platelets: 212 10*3/uL (ref 150–400)
RBC: 2.45 MIL/uL — ABNORMAL LOW (ref 4.22–5.81)
RBC: 2.62 MIL/uL — AB (ref 4.22–5.81)
RDW: 17 % — ABNORMAL HIGH (ref 11.5–15.5)
RDW: 17.7 % — ABNORMAL HIGH (ref 11.5–15.5)
WBC: 4.5 10*3/uL (ref 4.0–10.5)
WBC: 5.4 10*3/uL (ref 4.0–10.5)

## 2018-07-25 LAB — URINALYSIS, ROUTINE W REFLEX MICROSCOPIC
Bilirubin Urine: NEGATIVE
GLUCOSE, UA: NEGATIVE mg/dL
KETONES UR: 5 mg/dL — AB
Nitrite: NEGATIVE
Protein, ur: 100 mg/dL — AB
Specific Gravity, Urine: 1.009 (ref 1.005–1.030)
pH: 8 (ref 5.0–8.0)

## 2018-07-25 LAB — MRSA PCR SCREENING: MRSA by PCR: NEGATIVE

## 2018-07-25 LAB — BASIC METABOLIC PANEL
ANION GAP: 7 (ref 5–15)
BUN: 58 mg/dL — ABNORMAL HIGH (ref 8–23)
CHLORIDE: 115 mmol/L — AB (ref 98–111)
CO2: 17 mmol/L — AB (ref 22–32)
Calcium: 7.2 mg/dL — ABNORMAL LOW (ref 8.9–10.3)
Creatinine, Ser: 4.84 mg/dL — ABNORMAL HIGH (ref 0.61–1.24)
GFR calc non Af Amer: 11 mL/min — ABNORMAL LOW (ref >60)
GFR, EST AFRICAN AMERICAN: 13 mL/min — AB (ref >60)
GLUCOSE: 80 mg/dL (ref 70–99)
Potassium: 4.6 mmol/L (ref 3.5–5.1)
Sodium: 139 mmol/L (ref 135–145)

## 2018-07-25 LAB — HEMOGLOBIN AND HEMATOCRIT, BLOOD
HEMATOCRIT: 24.1 % — AB (ref 39.0–52.0)
Hemoglobin: 8.4 g/dL — ABNORMAL LOW (ref 13.0–17.0)

## 2018-07-25 LAB — HIV ANTIBODY (ROUTINE TESTING W REFLEX): HIV Screen 4th Generation wRfx: NONREACTIVE

## 2018-07-25 MED ORDER — BELLADONNA ALKALOIDS-OPIUM 16.2-60 MG RE SUPP
1.0000 | Freq: Four times a day (QID) | RECTAL | Status: DC | PRN
  Filled 2018-07-25 (×2): qty 1

## 2018-07-25 MED ORDER — LORATADINE 10 MG PO TABS
10.0000 mg | ORAL_TABLET | Freq: Every day | ORAL | Status: DC
  Administered 2018-07-25 – 2018-07-26 (×2): 10 mg via ORAL
  Filled 2018-07-25 (×2): qty 1

## 2018-07-25 MED ORDER — TIOTROPIUM BROMIDE MONOHYDRATE 18 MCG IN CAPS
18.0000 ug | ORAL_CAPSULE | Freq: Every day | RESPIRATORY_TRACT | Status: DC
  Filled 2018-07-25 (×2): qty 5

## 2018-07-25 MED ORDER — SODIUM BICARBONATE 650 MG PO TABS
650.0000 mg | ORAL_TABLET | Freq: Two times a day (BID) | ORAL | Status: DC
  Administered 2018-07-25 – 2018-07-26 (×3): 650 mg via ORAL
  Filled 2018-07-25 (×3): qty 1

## 2018-07-25 MED ORDER — SODIUM CHLORIDE 0.9 % IV SOLN
1.0000 g | INTRAVENOUS | Status: DC
  Administered 2018-07-25 – 2018-07-26 (×2): 1 g via INTRAVENOUS
  Filled 2018-07-25 (×2): qty 1

## 2018-07-25 MED ORDER — IPRATROPIUM-ALBUTEROL 0.5-2.5 (3) MG/3ML IN SOLN: 3.0000 mL | RESPIRATORY_TRACT | Status: DC | PRN

## 2018-07-25 MED ORDER — MOMETASONE FURO-FORMOTEROL FUM 100-5 MCG/ACT IN AERO
2.0000 | INHALATION_SPRAY | Freq: Two times a day (BID) | RESPIRATORY_TRACT | Status: DC
  Administered 2018-07-25 – 2018-07-26 (×3): 2 via RESPIRATORY_TRACT
  Filled 2018-07-25: qty 8.8

## 2018-07-25 MED ORDER — OXYBUTYNIN CHLORIDE 5 MG PO TABS
5.0000 mg | ORAL_TABLET | Freq: Four times a day (QID) | ORAL | Status: DC | PRN
  Administered 2018-07-25 – 2018-07-26 (×4): 5 mg via ORAL
  Filled 2018-07-25 (×4): qty 1

## 2018-07-25 NOTE — Progress Notes (Signed)
PROGRESS NOTE    Carl Murphy  [name] DOB: 1946/09/27 DOA: 07/24/2018 PCP: [name], MD    Brief Narrative:  Carl Murphy is a 72 y.o. male with medical history of bladder cancer with nephrostomy tubes, COPD, smoker, HTN who is hard of hearing and presents at the encouragement of his family for feeling very weak and urinating clots. He has been urinating blood for about 1 month now and his urologists are aware and have been treating but "scraping his bladder". However, this past week, the daughter states, his bleeding has been worse and he is passing clots. The patient was asked to come to the ED by his Urologist office earlier this week but he did not. Today he is noted to have a Hb of 5.1 and SBP in 80s and is being admitted for transfusions.  When inquiring about presyncope or syncope, he denies it but does tell me he has been falling. He fell earlier this week because he "became too weak" and had a fall a few weeks ago.  He also has had urostomy tubes for 2 months now. The bag has fallen off of his right tube and they have not noted much urine in the bag of the left tube.  Of note, while I am examining him and the tubes, I not that there is urine leaking out from the right tubes as the bed is soaked under it. I also note a significant amount of urine on the bed under his left leg where his urostomy bag is attached to his leg. The bag is empty.   ED Course: Hb 5.1, Cr 5.52   Assessment & Plan:   Principal Problem:   Acute blood loss anemia Active Problems:   Gross hematuria   Hypotension due to blood loss   Malignant neoplasm of trigone of bladder (HCC)   Tobacco abuse   COPD (chronic obstructive pulmonary disease) (HCC)   Tobacco dependence   CKD (chronic kidney disease)   Bilateral hydronephrosis   Malfunction of nephrostomy tube (HCC)   Acidosis   Acute renal failure superimposed on stage 4 chronic kidney disease (HCC)   Anemia due to acute blood loss  Nephrostomy tube displaced (HCC)   Ureteral obstruction   Hydronephrosis  #1 gross hematuria/acute blood loss anemia Patient presented with ongoing gross hematuria noted to have a hemoglobin of 5.1.  Patient with history of metastatic muscle invasive bladder cancer status post palliative TURBT May 2019.  Patient with ongoing gross hematuria passing blood clots associated with significant bladder spasms.  Status post 2 units packed red blood cells hemoglobin currently at 8.1 from 5.1 on admission.  Last hemoglobin was 9.0 on 07/16/2018.  Patient may benefit from palliative TURBT.  Neurology has been consulted and are following.  2.  Hypotension Secondary to problem #1.  Blood pressure improved with transfusion of 2 units packed red blood cells and IV fluids.  Check a UA with cultures and sensitivities to rule out UTI.  Chest x-ray negative for any acute infiltrates.  Patient noted to have a temperature of 95 and placed on the bear hugger.  Decrease IV fluids to 75 cc/h.  Follow.  3.  Bilateral hydronephrosis/bilateral ureteral obstruction with bilateral nephrostomy tubes/nephrostomy tube malfunction On admission noted that right back fell off nephrostomy tube which was draining in the bed and left nephrostomy tube leaking.  Urine somewhat cloudy.  Check a UA with cultures and sensitivities.  Patient seen in consultation by urology who have ordered exchange  of nephrostomy tubes per IR.  4.  History of metastatic muscle invasive bladder cancer status post palliative TURBT May 2019 Patient under the care of Dr. [name], oncology and recently transitioned from [facility] to [name] with [name] support.  Per daughter patient also with palliative care following.  Per urology.  Will have palliative care to reassess for goals of care.  5.  COPD Somewhat stable.  Will place on Dulera and Spiriva.  Duo nebs as needed.  Place on Claritin.  Continue Flonase.  6.  Bladder spasms Place on B and O  suppositories every 6 hours as needed.  7.  Tobacco abuse Continue nicotine patch.  Tobacco cessation.  8.  Acidosis Likely secondary to chronic kidney disease.  Placed on bicarb tablets twice daily  9.  Acute on chronic kidney disease stage IV Baseline creatinine currently 4.5.  Creatinine on admission was 5.52 and felt likely secondary to prerenal azotemia as patient noted to be hypotensive with hematuria.  Renal function trending back down with hydration.  Decrease IV fluids to 75 cc/h.  10.  Hypertension Antihypertensive medications on hold as patient had presented with hypotension.  Follow.   DVT prophylaxis: SCDs Code Status: Partial Family Communication: Updated patient and daughter at bedside. Disposition Plan: Remain in the stepdown unit.   Consultants:   Urology: Dr. Junious Silk 07/24/2018  Procedures:   Chest x-ray 07/24/2018  Abdominal films 07/24/2018  Transfused 2 units packed red blood cells 07/24/2018  Antimicrobials:   None   Subjective: Patient laying in bed.  States some improvement with generalized weakness he had presented with.  Still with gross hematuria and passing clots.  Down soaked with urine.  Denies any chest pain.  No shortness of breath at the moment.  Complaining of significant bladder spasms which occurred just prior to passing clots.    Objective: Vitals:   07/25/18 0000 07/25/18 0400 07/25/18 0402 07/25/18 0800  BP: (!) 102/44 (!) 132/52    Pulse: (!) 50 (!) 54    Resp: 16 16    Temp:   97.7 F (36.5 C) 98.1 F (36.7 C)  TempSrc:   Axillary Axillary  SpO2: 96% 98%    Weight:      Height:        Intake/Output Summary (Last 24 hours) at 07/25/2018 1002 Last data filed at 07/25/2018 0500 Gross per 24 hour  Intake 2582.02 ml  Output -  Net 2582.02 ml   Filed Weights   07/24/18 1346 07/24/18 1800  Weight: 74.8 kg 73.3 kg    Examination:  General exam: Ashen.  Pale. Respiratory system: Some scattered coarse breath sounds,  minimal wheezing.  No rhonchi.  No crackles anterior lung fields.  Respiratory effort normal. Cardiovascular system: S1 & S2 heard, RRR. No JVD, murmurs, rubs, gallops or clicks. No pedal edema. Gastrointestinal system: Abdomen is nondistended, soft and nontender. No organomegaly or masses felt. Normal bowel sounds heard. Central nervous system: Alert and oriented. No focal neurological deficits. Extremities: Symmetric 5 x 5 power. Genitourinary: Blood clot noted coming out through the meatus of penis.  Dried blood noted on sheets. Skin: No rashes, lesions or ulcers Psychiatry: Judgement and insight appear normal. Mood & affect appropriate.     Data Reviewed: I have personally reviewed following labs and imaging studies  CBC: Recent Labs  Lab 07/24/18 1421 07/24/18 2330 07/25/18 0611  WBC 4.8 4.5 5.4  NEUTROABS 4.0  --   --   HGB 5.1* 7.6* 8.1*  HCT 15.2* 22.1* 23.2*  MCV 91.6 90.2 88.5  PLT 186 174 998   Basic Metabolic Panel: Recent Labs  Lab 07/24/18 1421 07/25/18 0611  NA 138 139  K 4.7 4.6  CL 111 115*  CO2 18* 17*  GLUCOSE 94 80  BUN 64* 58*  CREATININE 5.52* 4.84*  CALCIUM 7.6* 7.2*   GFR: Estimated Creatinine Clearance: 13.3 mL/min (A) (by C-G formula based on SCr of 4.84 mg/dL (H)). Liver Function Tests: Recent Labs  Lab 07/24/18 1421  AST 14*  ALT 9  ALKPHOS 55  BILITOT 0.3  PROT 6.1*  ALBUMIN 2.7*   No results for input(s): LIPASE, AMYLASE in the last 168 hours. No results for input(s): AMMONIA in the last 168 hours. Coagulation Profile: No results for input(s): INR, PROTIME in the last 168 hours. Cardiac Enzymes: No results for input(s): CKTOTAL, CKMB, CKMBINDEX, TROPONINI in the last 168 hours. BNP (last 3 results) No results for input(s): PROBNP in the last 8760 hours. HbA1C: No results for input(s): HGBA1C in the last 72 hours. CBG: Recent Labs  Lab 07/24/18 1339  GLUCAP 95   Lipid Profile: No results for input(s): CHOL, HDL,  LDLCALC, TRIG, CHOLHDL, LDLDIRECT in the last 72 hours. Thyroid Function Tests: No results for input(s): TSH, T4TOTAL, FREET4, T3FREE, THYROIDAB in the last 72 hours. Anemia Panel: No results for input(s): VITAMINB12, FOLATE, FERRITIN, TIBC, IRON, RETICCTPCT in the last 72 hours. Sepsis Labs: No results for input(s): PROCALCITON, LATICACIDVEN in the last 168 hours.  No results found for this or any previous visit (from the past 240 hour(s)).       Radiology Studies: Dg Abdomen 1 View  Result Date: 07/24/2018 CLINICAL DATA:  Urinating blood. Patient reports right nephrostomy tube has come out. EXAM: ABDOMEN - 1 VIEW COMPARISON:  CT scan June 07, 2018 FINDINGS: The right nephrostomy tube history ane Jan configuration. There is now a kink in the distal tube suggesting it is at least partially pulled out. The left nephrostomy tube appears to be in expected location. No other acute abnormalities. IMPRESSION: Findings suggesting the right nephrostomy tube has been at least partially pulled out of the renal collecting system. Electronically Signed   By: Dorise Bullion III M.D   On: 07/24/2018 16:05   Dg Chest Port 1 View  Result Date: 07/24/2018 CLINICAL DATA:  Bladder cancer metastatic the lungs. EXAM: PORTABLE CHEST 1 VIEW COMPARISON:  CT scan June 07, 2018 FINDINGS: The known mass in the left upper lobe is again identified. Other smaller nodules on the recent CT scan are not appreciated on this study. A right Port-A-Cath is stable. No pneumothorax. Mild cardiomegaly. No other interval changes. IMPRESSION: Known mass in the left upper lobe. Other known pulmonary metastases not well visualized on this chest x-ray. No acute abnormalities. Electronically Signed   By: Dorise Bullion III M.D   On: 07/24/2018 16:01        Scheduled Meds: . sodium chloride   Intravenous Once  . citalopram  20 mg Oral Daily  . feeding supplement (ENSURE ENLIVE)  237 mL Oral BID BM  . loratadine  10 mg Oral Daily    . nicotine  21 mg Transdermal Daily  . sodium bicarbonate  650 mg Oral BID  . sodium chloride flush  3 mL Intravenous Q12H   Continuous Infusions: . sodium chloride    . sodium chloride 75 mL/hr at 07/25/18 0916     LOS: 1 day    Time spent: 40 mins    Irine Seal,  MD [facility] Pager [phone]  If 7PM-7AM, please contact night-coverage [email or web address] Password [email or web address] 07/25/2018, 10:02 AM

## 2018-07-25 NOTE — Progress Notes (Signed)
Referring Physician(s): Dr Cline Cools  Supervising Physician: Marybelle Killings  Patient Status:  [facility] - In-pt  Chief Complaint:  Gross hematuria Urothelial Ca  Subjective:  Bilat PCNs placed in IR 03/2018 Was "scheduled for routine exchange last week" Now in hospital for hematuria; clots Hg 5.0 on admission Bags seem to be leaking    Allergies: Carboplatin  Medications: Prior to Admission medications   Medication Sig Start Date End Date Taking? Authorizing Provider  acetaminophen (TYLENOL) 500 MG tablet Take 1,000 mg by mouth daily.    Yes [provider]  amLODipine (NORVASC) 5 MG tablet Take 5 mg by mouth daily.  04/09/18  Yes [provider]  carvedilol (COREG) 25 MG tablet Take 25 mg by mouth 2 (two) times daily.  01/31/16  Yes [provider]  cetirizine (ZYRTEC) 10 MG tablet Take 10 mg by mouth daily.   Yes [provider]  citalopram (CELEXA) 20 MG tablet Take 20 mg by mouth daily.  01/31/16  Yes [provider]  ENSURE (ENSURE) Take 237 mLs by mouth once a week.   Yes [provider]  fluticasone (FLONASE) 50 MCG/ACT nasal spray Place 2 sprays into both nostrils daily as needed for allergies or rhinitis.   Yes [provider]  ipratropium-albuterol (DUONEB) 0.5-2.5 (3) MG/3ML SOLN Take 3 mLs by nebulization every 6 (six) hours as needed. 06/18/18  Yes Derek Jack, MD  lidocaine (XYLOCAINE) 2 % solution Use as directed 15 mLs in the mouth or throat every 6 (six) hours as needed for mouth pain.  07/07/18  Yes [provider]  VENTOLIN HFA 108 (90 Base) MCG/ACT inhaler Inhale 1-2 puffs into the lungs every 4 (four) hours as needed for wheezing or shortness of breath.  04/27/18  Yes [provider]  oxybutynin (DITROPAN) 5 MG tablet Take 1 tablet (5 mg total) by mouth every 6 (six) hours as needed for bladder spasms. Patient not taking: Reported on 07/24/2018 05/12/17   Cleon Gustin, MD    sucralfate (CARAFATE) 1 GM/10ML suspension Carafate 1gm/81ml and Viscous lidocaine 2% 1:1 mixture. Swish and swallow 1 tablespoon four times a day Patient not taking: Reported on 07/24/2018 07/02/18   Derek Jack, MD     Vital Signs: BP (!) 149/61 (BP Location: Left Arm)   Pulse (!) 55   Temp 98.8 F (37.1 C) (Oral)   Resp 20   Ht 5\' 7"  (1.702 m)   Wt 161 lb 9.6 oz (73.3 kg)   SpO2 100%   BMI 25.31 kg/m   Physical Exam  Constitutional: He is oriented to person, place, and time.  Abdominal: Soft.  Musculoskeletal: Normal range of motion.  Neurological: He is alert and oriented to person, place, and time.  Skin: Skin is warm and dry.  Bilat PCNs intact; dressings both wet; blood tinged   Psychiatric: He has a normal mood and affect. His behavior is normal. Judgment and thought content normal.  Vitals reviewed.   Imaging: Dg Abdomen 1 View  Result Date: 07/24/2018 CLINICAL DATA:  Urinating blood. Patient reports right nephrostomy tube has come out. EXAM: ABDOMEN - 1 VIEW COMPARISON:  CT scan June 07, 2018 FINDINGS: The right nephrostomy tube history ane Jan configuration. There is now a kink in the distal tube suggesting it is at least partially pulled out. The left nephrostomy tube appears to be in expected location. No other acute abnormalities. IMPRESSION: Findings suggesting the right nephrostomy tube has been at least partially pulled out  of the renal collecting system. Electronically Signed   By: Dorise Bullion III M.D   On: 07/24/2018 16:05   Dg Chest Port 1 View  Result Date: 07/24/2018 CLINICAL DATA:  Bladder cancer metastatic the lungs. EXAM: PORTABLE CHEST 1 VIEW COMPARISON:  CT scan June 07, 2018 FINDINGS: The known mass in the left upper lobe is again identified. Other smaller nodules on the recent CT scan are not appreciated on this study. A right Port-A-Cath is stable. No pneumothorax. Mild cardiomegaly. No other interval changes. IMPRESSION: Known mass in the  left upper lobe. Other known pulmonary metastases not well visualized on this chest x-ray. No acute abnormalities. Electronically Signed   By: Dorise Bullion III M.D   On: 07/24/2018 16:01    Labs:  CBC: Recent Labs    07/16/18 1055 07/24/18 1421 07/24/18 2330 07/25/18 0611 07/25/18 1100  WBC 6.1 4.8 4.5 5.4  --   HGB 9.0* 5.1* 7.6* 8.1* 8.4*  HCT 28.0* 15.2* 22.1* 23.2* 24.1*  PLT 214 186 174 212  --     COAGS: Recent Labs    03/25/18 0745  INR 0.94    BMP: Recent Labs    07/09/18 1007 07/16/18 1055 07/24/18 1421 07/25/18 0611  NA 138 138 138 139  K 5.1 4.6 4.7 4.6  CL 111 110 111 115*  CO2 19* 18* 18* 17*  GLUCOSE 97 91 94 80  BUN 50* 49* 64* 58*  CALCIUM 8.1* 8.3* 7.6* 7.2*  CREATININE 4.82* 4.72* 5.52* 4.84*  GFRNONAA 11* 11* 9* 11*  GFRAA 13* 13* 11* 13*    LIVER FUNCTION TESTS: Recent Labs    06/28/18 1241 07/09/18 1007 07/16/18 1055 07/24/18 1421  BILITOT 0.5 0.5 0.5 0.3  AST 15 14* 16 14*  ALT 8 9 9 9   ALKPHOS 53 45 54 55  PROT 6.8 6.6 7.1 6.1*  ALBUMIN 3.0* 2.9* 3.2* 2.7*    Assessment and Plan:  Bladder Ca Bilat PCNs placed in IR 03/2018 Hematuria Need routine change Tubes may be leaking Pt is aware of B PCN exchange planned for 8/26 Agreeable to proceed Consent signed andin chart  Electronically Signed: Jillisa Harris A, PA-C 07/25/2018, 1:16 PM   I spent a total of 15 Minutes at the the patient's bedside AND on the patient's hospital floor or unit, greater than 50% of which was counseling/coordinating care for B PCN exchange

## 2018-07-25 NOTE — Progress Notes (Signed)
Subjective: Patient reports no specific complaints.  Objective: Vital signs in last 24 hours: Temp:  [95 F (35 C)-98.1 F (36.7 C)] 98.1 F (36.7 C) (08/25 0800) Pulse Rate:  [43-55] 55 (08/25 0800) Resp:  [14-20] 20 (08/25 0800) BP: (82-149)/(41-62) 149/61 (08/25 0800) SpO2:  [90 %-100 %] 100 % (08/25 0800) Weight:  [73.3 kg-74.8 kg] 73.3 kg (08/24 1800)  Intake/Output from previous day: 08/24 0701 - 08/25 0700 In: 2582 [I.V.:1039.5; Blood:542.5; IV FUXNATFTD:3220] Out: -  Intake/Output this shift: No intake/output data recorded.  Physical Exam:  No acute distress Alert and oriented Watching TV Abdomen soft and nontender, bladder not distended GU: Blood clots per meatus  Lab Results: Recent Labs    07/24/18 1421 07/24/18 2330 07/25/18 0611  HGB 5.1* 7.6* 8.1*  HCT 15.2* 22.1* 23.2*   BMET Recent Labs    07/24/18 1421 07/25/18 0611  NA 138 139  K 4.7 4.6  CL 111 115*  CO2 18* 17*  GLUCOSE 94 80  BUN 64* 58*  CREATININE 5.52* 4.84*  CALCIUM 7.6* 7.2*   No results for input(s): LABPT, INR in the last 72 hours. No results for input(s): LABURIN in the last 72 hours. Results for orders placed or performed during the hospital encounter of 11/03/17  Culture, Urine     Status: Abnormal   Collection Time: 11/03/17  4:00 PM  Result Value Ref Range Status   Specimen Description URINE, RANDOM  Final   Special Requests NONE  Final   Culture MULTIPLE SPECIES PRESENT, SUGGEST RECOLLECTION (A)  Final   Report Status 11/05/2017 FINAL  Final    Studies/Results: Dg Abdomen 1 View  Result Date: 07/24/2018 CLINICAL DATA:  Urinating blood. Patient reports right nephrostomy tube has come out. EXAM: ABDOMEN - 1 VIEW COMPARISON:  CT scan June 07, 2018 FINDINGS: The right nephrostomy tube history ane Jan configuration. There is now a kink in the distal tube suggesting it is at least partially pulled out. The left nephrostomy tube appears to be in expected location. No  other acute abnormalities. IMPRESSION: Findings suggesting the right nephrostomy tube has been at least partially pulled out of the renal collecting system. Electronically Signed   By: Dorise Bullion III M.D   On: 07/24/2018 16:05   Dg Chest Port 1 View  Result Date: 07/24/2018 CLINICAL DATA:  Bladder cancer metastatic the lungs. EXAM: PORTABLE CHEST 1 VIEW COMPARISON:  CT scan June 07, 2018 FINDINGS: The known mass in the left upper lobe is again identified. Other smaller nodules on the recent CT scan are not appreciated on this study. A right Port-A-Cath is stable. No pneumothorax. Mild cardiomegaly. No other interval changes. IMPRESSION: Known mass in the left upper lobe. Other known pulmonary metastases not well visualized on this chest x-ray. No acute abnormalities. Electronically Signed   By: Dorise Bullion III M.D   On: 07/24/2018 16:01    Assessment/Plan: Carl Murphy hematuria- I discussed with patient placement of a catheter, flushing the bladder and starting CBI and he said "hell no".  He has not done well with catheters in the past.  I am going to stage the kidneys and the bladder with noncontrast CT as he may need palliative TURBT this week.  -Stage IV urothelial carcinoma-currently under the care of Dr. Delton Murphy and recently transitioned from [facility] to [name] with [name] support due to disease progression on 05/2018 CT. Agree with palliative care consult.  -bilateral ureteral obstruction with bilateral nephrostomy tubes-I put in an order for interventional radiology  to exchange the tubes.    LOS: 1 day   Carl Murphy 07/25/2018, 11:19 AM

## 2018-07-26 LAB — CBC WITH DIFFERENTIAL/PLATELET
Basophils Absolute: 0 10*3/uL (ref 0.0–0.1)
Basophils Relative: 0 %
EOS PCT: 0 %
Eosinophils Absolute: 0 10*3/uL (ref 0.0–0.7)
HEMATOCRIT: 20.9 % — AB (ref 39.0–52.0)
Hemoglobin: 7.4 g/dL — ABNORMAL LOW (ref 13.0–17.0)
LYMPHS PCT: 12 %
Lymphs Abs: 0.6 10*3/uL — ABNORMAL LOW (ref 0.7–4.0)
MCH: 31.2 pg (ref 26.0–34.0)
MCHC: 35.4 g/dL (ref 30.0–36.0)
MCV: 88.2 fL (ref 78.0–100.0)
MONO ABS: 0.3 10*3/uL (ref 0.1–1.0)
MONOS PCT: 7 %
NEUTROS ABS: 3.9 10*3/uL (ref 1.7–7.7)
Neutrophils Relative %: 81 %
PLATELETS: 205 10*3/uL (ref 150–400)
RBC: 2.37 MIL/uL — ABNORMAL LOW (ref 4.22–5.81)
RDW: 18 % — AB (ref 11.5–15.5)
WBC: 4.8 10*3/uL (ref 4.0–10.5)

## 2018-07-26 LAB — RENAL FUNCTION PANEL
Albumin: 2.2 g/dL — ABNORMAL LOW (ref 3.5–5.0)
Anion gap: 8 (ref 5–15)
BUN: 47 mg/dL — AB (ref 8–23)
CO2: 17 mmol/L — AB (ref 22–32)
CREATININE: 4.31 mg/dL — AB (ref 0.61–1.24)
Calcium: 7.1 mg/dL — ABNORMAL LOW (ref 8.9–10.3)
Chloride: 114 mmol/L — ABNORMAL HIGH (ref 98–111)
GFR calc Af Amer: 15 mL/min — ABNORMAL LOW (ref >60)
GFR calc non Af Amer: 13 mL/min — ABNORMAL LOW (ref >60)
Glucose, Bld: 79 mg/dL (ref 70–99)
POTASSIUM: 4.4 mmol/L (ref 3.5–5.1)
Phosphorus: 3.3 mg/dL (ref 2.5–4.6)
Sodium: 139 mmol/L (ref 135–145)

## 2018-07-26 LAB — URINE CULTURE

## 2018-07-26 MED ORDER — HEPARIN SOD (PORK) LOCK FLUSH 100 UNIT/ML IV SOLN
500.0000 [IU] | INTRAVENOUS | Status: AC | PRN
  Administered 2018-07-26: 500 [IU]

## 2018-07-26 MED ORDER — BOOST PLUS PO LIQD
237.0000 mL | Freq: Three times a day (TID) | ORAL | Status: DC
  Filled 2018-07-26: qty 237

## 2018-07-26 MED ORDER — OXYBUTYNIN CHLORIDE 5 MG PO TABS
5.0000 mg | ORAL_TABLET | Freq: Four times a day (QID) | ORAL | Status: DC
  Administered 2018-07-26: 5 mg via ORAL
  Filled 2018-07-26: qty 1

## 2018-07-26 MED ORDER — BELLADONNA ALKALOIDS-OPIUM 16.2-60 MG RE SUPP
1.0000 | Freq: Four times a day (QID) | RECTAL | Status: DC
  Administered 2018-07-26: 1 via RECTAL
  Filled 2018-07-26: qty 1

## 2018-07-26 MED ORDER — OXYBUTYNIN CHLORIDE 5 MG PO TABS: 5.0000 mg | ORAL_TABLET | Freq: Four times a day (QID) | ORAL | Status: DC | PRN

## 2018-07-26 NOTE — Discharge Summary (Signed)
Physician Discharge Summary  CHANCELLOR VANDERLOOP TWS:[id number] DOB: [birth year] DOA: 07/24/2018  PCP: [name], MD      Patient left AMA  Admit date: 07/24/2018 Discharge date: 07/26/2018  Time spent: 15 minutes  Recommendations for Outpatient Follow-up:  1. Patient left AMA   Discharge Diagnoses:  Principal Problem:   Acute blood loss anemia Active Problems:   Gross hematuria   Hypotension due to blood loss   Malignant neoplasm of trigone of bladder (HCC)   Tobacco abuse   COPD (chronic obstructive pulmonary disease) (HCC)   Tobacco dependence   CKD (chronic kidney disease)   Bilateral hydronephrosis   Malfunction of nephrostomy tube (HCC)   Acidosis   Acute renal failure superimposed on stage 4 chronic kidney disease (HCC)   Anemia due to acute blood loss   Nephrostomy tube displaced [facility])   Ureteral obstruction   Hydronephrosis   Discharge Condition: Patient left AMA  Diet recommendation: Patient left AMA  Filed Weights   07/24/18 1346 07/24/18 1800  Weight: 74.8 kg 73.3 kg    History of present illness:  Per Dr. Dwaine Deter is a 72 y.o. male with medical history of bladder cancer with nephrostomy tubes, COPD, smoker, HTN who is hard of hearing and presents at the encouragement of his family for feeling very weak and urinating clots. He has been urinating blood for about 1 month now and his urologists are aware and have been treating but "scraping his bladder". However, this past week, the daughter states, his bleeding has been worse and he is passing clots. The patient was asked to come to the ED by his Urologist office earlier this week but he did not. Today he is noted to have a Hb of 5.1 and SBP in 80s and is being admitted for transfusions.  When inquiring about presyncope or syncope, he denies it but does tell me he has been falling. He fell earlier this week because he "became too weak" and had a fall a few weeks ago.  He also has had urostomy tubes for  2 months now. The bag has fallen off of his right tube and they have not noted much urine in the bag of the left tube.  Of note, while I am examining him and the tubes, I not that there is urine leaking out from the right tubes as the bed is soaked under it. I also note a significant amount of urine on the bed under his left leg where his urostomy bag is attached to his leg. The bag is empty.   ED Course: Hb 5.1, Cr 5.52  Hospital Course:  Patient left AMA. For the rest of the hospitalization please see progress note from 07/26/2018.  Procedures:  See progress note from 07/26/2018  Consultations:  See progress note from 07/26/2018.  Discharge Exam: Vitals:   07/26/18 1500 07/26/18 1514  BP:    Pulse: 65 66  Resp: 14 19  Temp:    SpO2: 99% 99%    General: NAD Cardiovascular: RRR Respiratory: CTAB anterior lung fields.  Discharge Instructions    Patient left AMA Allergies  Allergen Reactions  . Carboplatin Shortness Of Breath    Reaction occurred after patient left [facility]. Pt reported extreme sob after leaving [facility] that eventually resolved on its own.      The results of significant diagnostics from this hospitalization (including imaging, microbiology, ancillary and laboratory) are listed below for reference.    Significant Diagnostic Studies: Ct Abdomen Pelvis  Wo Contrast  Result Date: 07/25/2018 CLINICAL DATA:  Invasive bladder cancer EXAM: CT ABDOMEN AND PELVIS WITHOUT CONTRAST TECHNIQUE: Multidetector CT imaging of the abdomen and pelvis was performed following the standard protocol without IV contrast. COMPARISON:  06/07/2018 FINDINGS: Lower chest: 1.8 cm nodule/metastasis in the anterior left upper lobe, previously 1.9 cm. Small bilateral pleural effusions. Emphysematous changes Cardiomegaly. Small pericardial effusion. Three vessel coronary atherosclerosis. Hepatobiliary: Unenhanced liver is unremarkable. Gallbladder is unremarkable. No intrahepatic or  extrahepatic ductal dilatation. Pancreas: Within normal limits. Spleen: Within normal limits. Adrenals/Urinary Tract: Mild thickening of the bilateral renal glands. Right renal cortical scarring/atrophy. Indwelling percutaneous nephrostomy catheter. Stable mild soft tissue fullness of the right sacral ureter (coronal image 56), poorly evaluated on unenhanced CT. No hydronephrosis. Left percutaneous nephrostomy catheter. Left renal vascular calcifications. Mild fullness of the left renal collecting system without frank hydronephrosis. Irregular bladder thickening. Layering posterior hyperdensity suggests hemorrhage (series 2/image 76). Possible calcified lesion on the left (series 2/image 74). Stomach/Bowel: Stomach is within normal limits. No evidence of bowel obstruction. Normal appendix (series 2/image 62). Sigmoid diverticulosis, without evidence of diverticulitis. Vascular/Lymphatic: No evidence of abdominal aortic aneurysm. Atherosclerotic calcifications of the abdominal aorta and branch vessels. No suspicious abdominopelvic lymphadenopathy. Reproductive: Prostate is grossly unremarkable. Other: Mild perihepatic ascites. No pelvic ascites. No frank peritoneal nodularity. Small fat containing periumbilical hernia (series 2/image 56). Musculoskeletal: Visualized osseous structures are within normal limits. IMPRESSION: Irregular bladder thickening with layering posterior hemorrhage and possible calcified bladder lesion in this patient with known bladder cancer. Stable soft tissue fullness of the right sacral ureter, poorly evaluated on unenhanced CT, upper tract disease not excluded. Indwelling bilateral percutaneous nephrostomy catheters. Fullness of the left renal collecting system without frank hydronephrosis. 1.8 cm left upper lobe pulmonary metastasis, incompletely visualized. Small bilateral pleural effusions. Additional ancillary findings as above. Electronically Signed   By: Julian Hy M.D.   On:  07/25/2018 14:46   Dg Abdomen 1 View  Result Date: 07/24/2018 CLINICAL DATA:  Urinating blood. Patient reports right nephrostomy tube has come out. EXAM: ABDOMEN - 1 VIEW COMPARISON:  CT scan June 07, 2018 FINDINGS: The right nephrostomy tube history ane Jan configuration. There is now a kink in the distal tube suggesting it is at least partially pulled out. The left nephrostomy tube appears to be in expected location. No other acute abnormalities. IMPRESSION: Findings suggesting the right nephrostomy tube has been at least partially pulled out of the renal collecting system. Electronically Signed   By: Dorise Bullion III M.D   On: 07/24/2018 16:05   Dg Chest Port 1 View  Result Date: 07/24/2018 CLINICAL DATA:  Bladder cancer metastatic the lungs. EXAM: PORTABLE CHEST 1 VIEW COMPARISON:  CT scan June 07, 2018 FINDINGS: The known mass in the left upper lobe is again identified. Other smaller nodules on the recent CT scan are not appreciated on this study. A right Port-A-Cath is stable. No pneumothorax. Mild cardiomegaly. No other interval changes. IMPRESSION: Known mass in the left upper lobe. Other known pulmonary metastases not well visualized on this chest x-ray. No acute abnormalities. Electronically Signed   By: Dorise Bullion III M.D   On: 07/24/2018 16:01    Microbiology: Recent Results (from the past 240 hour(s))  MRSA PCR Screening     Status: None   Collection Time: 07/24/18  3:34 PM  Result Value Ref Range Status   MRSA by PCR NEGATIVE NEGATIVE Final    Comment:        The  GeneXpert MRSA Assay (FDA approved for NASAL specimens only), is one component of a comprehensive MRSA colonization surveillance program. It is not intended to diagnose MRSA infection nor to guide or monitor treatment for MRSA infections. Performed at [facility], [place] [address], [place], [place] [address]   Culture, Urine     Status: Abnormal   Collection Time: 07/25/18  9:25 AM  Result  Value Ref Range Status   Specimen Description   Final    URINE, RANDOM Performed at [facility] [address], [place], [place] [address]    Special Requests   Final    NONE Performed at [facility], [place] [address] [place],  [address]    Culture MULTIPLE SPECIES PRESENT, SUGGEST RECOLLECTION (A)  Final   Report Status 07/26/2018 FINAL  Final     Labs: Basic Metabolic Panel: Recent Labs  Lab 07/24/18 1421 07/25/18 0611 07/26/18 0340  NA 138 139 139  K 4.7 4.6 4.4  CL 111 115* 114*  CO2 18* 17* 17*  GLUCOSE 94 80 79  BUN 64* 58* 47*  CREATININE 5.52* 4.84* 4.31*  CALCIUM 7.6* 7.2* 7.1*  PHOS  --   --  3.3   Liver Function Tests: Recent Labs  Lab 07/24/18 1421 07/26/18 0340  AST 14*  --   ALT 9  --   ALKPHOS 55  --   BILITOT 0.3  --   PROT 6.1*  --   ALBUMIN 2.7* 2.2*   No results for input(s): LIPASE, AMYLASE in the last 168 hours. No results for input(s): AMMONIA in the last 168 hours. CBC: Recent Labs  Lab 07/24/18 1421 07/24/18 2330 07/25/18 0611 07/25/18 1100 07/26/18 0340  WBC 4.8 4.5 5.4  --  4.8  NEUTROABS 4.0  --   --   --  3.9  HGB 5.1* 7.6* 8.1* 8.4* 7.4*  HCT 15.2* 22.1* 23.2* 24.1* 20.9*  MCV 91.6 90.2 88.5  --  88.2  PLT 186 174 212  --  205   Cardiac Enzymes: No results for input(s): CKTOTAL, CKMB, CKMBINDEX, TROPONINI in the last 168 hours. BNP: BNP (last 3 results) No results for input(s): BNP in the last 8760 hours.  ProBNP (last 3 results) No results for input(s): PROBNP in the last 8760 hours.  CBG: Recent Labs  Lab 07/24/18 1339  GLUCAP 95       Signed:  Irine Seal MD.  [facility] 07/26/2018, 4:54 PM

## 2018-07-26 NOTE — Progress Notes (Signed)
Initial Nutrition Assessment  DOCUMENTATION CODES:   Not applicable  INTERVENTION:   Boost Plus chocolate TID- Each supplement provides 360kcal and 14g protein.    NUTRITION DIAGNOSIS:   Increased nutrient needs related to chronic illness, cancer and cancer related treatments as evidenced by estimated needs.  GOAL:   Patient will meet greater than or equal to 90% of their needs  MONITOR:   PO intake, Supplement acceptance, Weight trends, Labs  REASON FOR ASSESSMENT:   Consult Assessment of nutrition requirement/status  ASSESSMENT:   Patient with PMH significant for bladder cancer s/p nephrostomy tubes and TURBT, COPD, smoker, HTN who is hard of hearing and presents at the encouragement of his family for feeling very weak and urinating clots. Admitted for Bilateral hydronephrosis/bilateral ureteral obstruction with bilateral nephrostomy tubes/nephrostomy tube malfunction and gross hematuria.    Pt expressed disinterest in nutritional assessment/intervention states "you are wasting your time" when asked questions. Family at bedside states pt has not had a very great appetite over the last 2-3 weeks. He typically eats out at [employer] because he lives alone and doesn't like to [employer] (usually orders cheeseburgers). Pt was seen by [facility] RD a year ago and was provided with information regarding oral nutrition supplements. Pt admits he has multiple cases sitting at home untouched because they give him diarrhea. Attempted to discussed lactose free options and pt stated "I wont's be compliant with what you say." Family was apologetic and would like for RD to order them despite what he says.   Pt unable to state his UBW but family states he has lost 30 lb within the last year. Records indicate pt weighed 178 lb 12/31/17 and 161 lb this admission (9.6% wt loss in 8 months, insignificant for time frame). Nutrition-Focused physical exam completed.   Medications reviewed.   Labs reviewed.   NUTRITION - FOCUSED PHYSICAL EXAM:    Most Recent Value  Orbital Region  No depletion  Upper Arm Region  No depletion  Thoracic and Lumbar Region  Unable to assess  Buccal Region  No depletion  Temple Region  No depletion  Clavicle Bone Region  No depletion  Clavicle and Acromion Bone Region  No depletion  Scapular Bone Region  Unable to assess  Dorsal Hand  No depletion  Patellar Region  No depletion  Anterior Thigh Region  No depletion  Posterior Calf Region  No depletion  Edema (RD Assessment)  None     Diet Order:   Diet Order            Diet clear liquid Room service appropriate? Yes; Fluid consistency: Thin  Diet effective now              EDUCATION NEEDS:   Education needs have been addressed  Skin:  Skin Assessment: Reviewed RN Assessment  Last BM:  07/25/18  Height:   Ht Readings from Last 1 Encounters:  07/24/18 5\' 7"  (1.702 m)    Weight:   Wt Readings from Last 1 Encounters:  07/24/18 73.3 kg    Ideal Body Weight:  67.3 kg  BMI:  Body mass index is 25.31 kg/m.  Estimated Nutritional Needs:   Kcal:  2000-2200 kcal  Protein:  100-115 grams  Fluid:  >/= 2 L/day  Mariana Single RD, LDN Clinical Nutrition Pager # - [phone]

## 2018-07-26 NOTE — Care Management Note (Signed)
Case Management Note  Patient Details  Name: Carl Murphy MRN: [id number] Date of Birth: 10/10/1946  Subjective/Objective:                  Chief Complaint:  Gross hematuria Urothelial Ca  Subjective:  Bilat PCNs placed in IR 03/2018 Was "scheduled for routine exchange last week" Now in hospital for hematuria; clots Hg 5.0 on admission Bags seem to be leaking history of metastatic muscle invasive bladder cancer who underwent palliative TURBT last on May 2019. He has bilateral ureteral obstruction and bilateral nephrostomy tubes. He is currently under the care of Dr. [name] and recently transitioned from [name] to [name] with [name] support. Patient admitted with hemoglobin of 5.1and creatinine of 5.52 with his baseline around 4.5. The patient has had episodes of urgency and voiding clots over the past 5 weeks which seemed worse last week. Also, his family reports he was due for nephrostomy tube exchange last week. The right bag fell off and the tube is just draining into the bed, the left bag is leaking urine. Both tubes look grimy. Action/Plan: In IR as above am of [id number] Following for porgression following for cm needs.  Expected Discharge Date:                  Expected Discharge Plan:  Home/Self Care  In-House Referral:     Discharge planning Services  CM Consult  Post Acute Care Choice:    Choice offered to:     DME Arranged:    DME Agency:     HH Arranged:    HH Agency:     Status of Service:  In process, will continue to follow  If discussed at Long Length of Stay Meetings, dates discussed:    Additional Comments:  [name], RN 07/26/2018, 10:39 AM

## 2018-07-26 NOTE — Progress Notes (Signed)
Patient requesting to leave AMA. Notified Dr. Grandville Silos and [facility]. Rounded with patient and he states he understands he is leaving against medical advice and doing so could result in death since not medically treated or cleared. Pt verbalized understanding and is still insistent he is going to leave now.

## 2018-07-26 NOTE — Progress Notes (Addendum)
Palliative:  I came to see Mr. Parmar and his family to discuss [place] but found that he has just left AMA. Consult was not complete. Thank you for this consult.   No charge  Vinie Sill, NP Palliative Medicine Team Pager # [phone] (M-F 8a-5p) Team Phone # [phone] (Nights/Weekends)

## 2018-07-26 NOTE — Progress Notes (Signed)
PROGRESS NOTE    SUNG PARODI  KZL:[id number] DOB: 23-Sep-1946 DOA: 07/24/2018 PCP: [name], MD    Brief Narrative:  Carl Murphy is a 72 y.o. male with medical history of bladder cancer with nephrostomy tubes, COPD, smoker, HTN who is hard of hearing and presents at the encouragement of his family for feeling very weak and urinating clots. He has been urinating blood for about 1 month now and his urologists are aware and have been treating but "scraping his bladder". However, this past week, the daughter states, his bleeding has been worse and he is passing clots. The patient was asked to come to the ED by his Urologist office earlier this week but he did not. Today he is noted to have a Hb of 5.1 and SBP in 80s and is being admitted for transfusions.  When inquiring about presyncope or syncope, he denies it but does tell me he has been falling. He fell earlier this week because he "became too weak" and had a fall a few weeks ago.  He also has had urostomy tubes for 2 months now. The bag has fallen off of his right tube and they have not noted much urine in the bag of the left tube.  Of note, while I am examining him and the tubes, I not that there is urine leaking out from the right tubes as the bed is soaked under it. I also note a significant amount of urine on the bed under his left leg where his urostomy bag is attached to his leg. The bag is empty.   ED Course: Hb 5.1, Cr 5.52   Assessment & Plan:   Principal Problem:   Acute blood loss anemia Active Problems:   Gross hematuria   Hypotension due to blood loss   Malignant neoplasm of trigone of bladder (HCC)   Tobacco abuse   COPD (chronic obstructive pulmonary disease) (HCC)   Tobacco dependence   CKD (chronic kidney disease)   Bilateral hydronephrosis   Malfunction of nephrostomy tube (HCC)   Acidosis   Acute renal failure superimposed on stage 4 chronic kidney disease (HCC)   Anemia due to acute blood loss  Nephrostomy tube displaced (HCC)   Ureteral obstruction   Hydronephrosis  #1 gross hematuria/acute blood loss anemia Patient presented with ongoing gross hematuria noted to have a hemoglobin of 5.1.  Patient with history of metastatic muscle invasive bladder cancer status post palliative TURBT May 2019.  Patient with ongoing gross hematuria passing blood clots associated with significant bladder spasms.  Status post 2 units packed red blood cells hemoglobin currently at 7.4 from 8.1 from 5.1 on admission.  Last hemoglobin was 9.0 on 07/16/2018.  Patient may benefit from palliative TURBT.  Urology following.   2.  Hypotension Secondary to problem #1.  Blood pressure improved with transfusion of 2 units packed red blood cells and IV fluids.  Urinalysis worrisome for UTI.  Urine cultures pending.  Chest x-ray negative for any acute infiltrates.  Blood pressure improved with hydration.  Patient started on IV Rocephin for probable UTI.  Follow.    3.  Bilateral hydronephrosis/bilateral ureteral obstruction with bilateral nephrostomy tubes/nephrostomy tube malfunction On admission noted that right back fell off nephrostomy tube which was draining in the bed and left nephrostomy tube leaking.  Urinalysis was cloudy and as such urine cultures obtained.  Urinalysis concerning for UTI and as such patient started on IV Rocephin.   Patient seen in consultation by urology who have  ordered exchange of nephrostomy tubes per IR to be done today 07/26/2018..  4.  History of metastatic muscle invasive bladder cancer status post palliative TURBT May 2019 Patient under the care of Dr. [name], oncology and recently transitioned from [facility] to [name] with [name] support.  Per daughter patient also with palliative care following.  Urology following.  Palliative care has been consulted for goals of care and consultation pending.    5.  COPD Stable.  Patient started on Dulera and Spiriva.  Continue Claritin,  Flonase.  Duo nebs as needed.    6.  Bladder spasms Patient still with bladder spasms.  Change to be an old suppositories to every 6 hours scheduled.  Oxybutynin as needed.    7.  Tobacco abuse Continue nicotine patch.  Tobacco cessation.  8.  Acidosis Likely secondary to chronic kidney disease.  Continue bicarb tablets twice daily.    9.  Acute on chronic kidney disease stage IV Baseline creatinine currently 2-3.  Creatinine on admission was 5.52 and felt likely secondary to prerenal azotemia as patient noted to be hypotensive with hematuria.  Renal function trending back down with hydration.  Continue gentle hydration and monitor for volume overload.   10.  Hypertension Antihypertensive medications on hold as patient had presented with hypotension.  Blood pressure improving.  Follow.  11.  Probable UTI Urine cultures pending.  Patient started on IV Rocephin.  12.??  Confusion Per family patient with confusion overnight.  Patient however alert and oriented x3.  Patient knows who the president is.  Discussion of why patient had presented to the hospital was had with patient and he seemed to understand why he is in the hospital and what interventions need to be done.  Due to family's concern for confusion will check an ammonia level.  CT head without contrast to rule out metastatic disease.  Consult with psychiatry for capacity.   DVT prophylaxis: SCDs Code Status: Partial Family Communication: Updated patient and daughter and wife at bedside. Disposition Plan: Remain in the stepdown unit.   Consultants:   Urology: Dr. Junious Silk 07/24/2018  Procedures:   Chest x-ray 07/24/2018  Abdominal films 07/24/2018  Transfused 2 units packed red blood cells 07/24/2018  Antimicrobials:   None   Subjective: Patient laying in bed.  Patient still endorsing gross hematuria and bladder spasms.  Denies any chest pain no shortness of breath.  Family does state last night patient with some  bouts of confusion.  Patient alert and oriented to self place and time.  Knows who the president is.    Objective: Vitals:   07/26/18 0400 07/26/18 0409 07/26/18 0411 07/26/18 0800  BP: 135/87     Pulse: (!) 59  (!) 56   Resp: 19  17   Temp:  97.7 F (36.5 C)  (!) 97.5 F (36.4 C)  TempSrc:  Oral  Oral  SpO2: (!) 84%  100%   Weight:      Height:        Intake/Output Summary (Last 24 hours) at 07/26/2018 0929 Last data filed at 07/26/2018 0400 Gross per 24 hour  Intake 1654.35 ml  Output 950 ml  Net 704.35 ml   Filed Weights   07/24/18 1346 07/24/18 1800  Weight: 74.8 kg 73.3 kg    Examination:  General exam: Ashen.  Pale. Respiratory system: Lungs clear to auscultation bilaterally anterior lung fields.  No wheezing, no crackles, no rhonchi.  Speaking in full sentences.  Cardiovascular system: Regular rate and rhythm no  murmurs rubs or gallops.  No lower extremity edema.  Gastrointestinal system: Abdomen is soft, nontender, nondistended, positive bowel sounds.  Central nervous system: Alert and oriented. No focal neurological deficits. Extremities: Symmetric 5 x 5 power. Skin: No rashes, lesions or ulcers Psychiatry: Judgement and insight appear normal. Mood & affect appropriate.     Data Reviewed: I have personally reviewed following labs and imaging studies  CBC: Recent Labs  Lab 07/24/18 1421 07/24/18 2330 07/25/18 0611 07/25/18 1100 07/26/18 0340  WBC 4.8 4.5 5.4  --  4.8  NEUTROABS 4.0  --   --   --  3.9  HGB 5.1* 7.6* 8.1* 8.4* 7.4*  HCT 15.2* 22.1* 23.2* 24.1* 20.9*  MCV 91.6 90.2 88.5  --  88.2  PLT 186 174 212  --  244   Basic Metabolic Panel: Recent Labs  Lab 07/24/18 1421 07/25/18 0611 07/26/18 0340  NA 138 139 139  K 4.7 4.6 4.4  CL 111 115* 114*  CO2 18* 17* 17*  GLUCOSE 94 80 79  BUN 64* 58* 47*  CREATININE 5.52* 4.84* 4.31*  CALCIUM 7.6* 7.2* 7.1*  PHOS  --   --  3.3   GFR: Estimated Creatinine Clearance: 14.9 mL/min (A) (by C-G  formula based on SCr of 4.31 mg/dL (H)). Liver Function Tests: Recent Labs  Lab 07/24/18 1421 07/26/18 0340  AST 14*  --   ALT 9  --   ALKPHOS 55  --   BILITOT 0.3  --   PROT 6.1*  --   ALBUMIN 2.7* 2.2*   No results for input(s): LIPASE, AMYLASE in the last 168 hours. No results for input(s): AMMONIA in the last 168 hours. Coagulation Profile: No results for input(s): INR, PROTIME in the last 168 hours. Cardiac Enzymes: No results for input(s): CKTOTAL, CKMB, CKMBINDEX, TROPONINI in the last 168 hours. BNP (last 3 results) No results for input(s): PROBNP in the last 8760 hours. HbA1C: No results for input(s): HGBA1C in the last 72 hours. CBG: Recent Labs  Lab 07/24/18 1339  GLUCAP 95   Lipid Profile: No results for input(s): CHOL, HDL, LDLCALC, TRIG, CHOLHDL, LDLDIRECT in the last 72 hours. Thyroid Function Tests: No results for input(s): TSH, T4TOTAL, FREET4, T3FREE, THYROIDAB in the last 72 hours. Anemia Panel: No results for input(s): VITAMINB12, FOLATE, FERRITIN, TIBC, IRON, RETICCTPCT in the last 72 hours. Sepsis Labs: No results for input(s): PROCALCITON, LATICACIDVEN in the last 168 hours.  Recent Results (from the past 240 hour(s))  MRSA PCR Screening     Status: None   Collection Time: 07/24/18  3:34 PM  Result Value Ref Range Status   MRSA by PCR NEGATIVE NEGATIVE Final    Comment:        The GeneXpert MRSA Assay (FDA approved for NASAL specimens only), is one component of a comprehensive MRSA colonization surveillance program. It is not intended to diagnose MRSA infection nor to guide or monitor treatment for MRSA infections. Performed at [facility], [place] [address] [place],  [address]          Radiology Studies: Ct Abdomen Pelvis Wo Contrast  Result Date: 07/25/2018 CLINICAL DATA:  Invasive bladder cancer EXAM: CT ABDOMEN AND PELVIS WITHOUT CONTRAST TECHNIQUE: Multidetector CT imaging of the abdomen and pelvis  was performed following the standard protocol without IV contrast. COMPARISON:  06/07/2018 FINDINGS: Lower chest: 1.8 cm nodule/metastasis in the anterior left upper lobe, previously 1.9 cm. Small bilateral pleural effusions. Emphysematous changes Cardiomegaly. Small pericardial effusion.  Three vessel coronary atherosclerosis. Hepatobiliary: Unenhanced liver is unremarkable. Gallbladder is unremarkable. No intrahepatic or extrahepatic ductal dilatation. Pancreas: Within normal limits. Spleen: Within normal limits. Adrenals/Urinary Tract: Mild thickening of the bilateral renal glands. Right renal cortical scarring/atrophy. Indwelling percutaneous nephrostomy catheter. Stable mild soft tissue fullness of the right sacral ureter (coronal image 56), poorly evaluated on unenhanced CT. No hydronephrosis. Left percutaneous nephrostomy catheter. Left renal vascular calcifications. Mild fullness of the left renal collecting system without frank hydronephrosis. Irregular bladder thickening. Layering posterior hyperdensity suggests hemorrhage (series 2/image 76). Possible calcified lesion on the left (series 2/image 74). Stomach/Bowel: Stomach is within normal limits. No evidence of bowel obstruction. Normal appendix (series 2/image 62). Sigmoid diverticulosis, without evidence of diverticulitis. Vascular/Lymphatic: No evidence of abdominal aortic aneurysm. Atherosclerotic calcifications of the abdominal aorta and branch vessels. No suspicious abdominopelvic lymphadenopathy. Reproductive: Prostate is grossly unremarkable. Other: Mild perihepatic ascites. No pelvic ascites. No frank peritoneal nodularity. Small fat containing periumbilical hernia (series 2/image 56). Musculoskeletal: Visualized osseous structures are within normal limits. IMPRESSION: Irregular bladder thickening with layering posterior hemorrhage and possible calcified bladder lesion in this patient with known bladder cancer. Stable soft tissue fullness of the  right sacral ureter, poorly evaluated on unenhanced CT, upper tract disease not excluded. Indwelling bilateral percutaneous nephrostomy catheters. Fullness of the left renal collecting system without frank hydronephrosis. 1.8 cm left upper lobe pulmonary metastasis, incompletely visualized. Small bilateral pleural effusions. Additional ancillary findings as above. Electronically Signed   By: Julian Hy M.D.   On: 07/25/2018 14:46   Dg Abdomen 1 View  Result Date: 07/24/2018 CLINICAL DATA:  Urinating blood. Patient reports right nephrostomy tube has come out. EXAM: ABDOMEN - 1 VIEW COMPARISON:  CT scan June 07, 2018 FINDINGS: The right nephrostomy tube history ane Jan configuration. There is now a kink in the distal tube suggesting it is at least partially pulled out. The left nephrostomy tube appears to be in expected location. No other acute abnormalities. IMPRESSION: Findings suggesting the right nephrostomy tube has been at least partially pulled out of the renal collecting system. Electronically Signed   By: Dorise Bullion III M.D   On: 07/24/2018 16:05   Dg Chest Port 1 View  Result Date: 07/24/2018 CLINICAL DATA:  Bladder cancer metastatic the lungs. EXAM: PORTABLE CHEST 1 VIEW COMPARISON:  CT scan June 07, 2018 FINDINGS: The known mass in the left upper lobe is again identified. Other smaller nodules on the recent CT scan are not appreciated on this study. A right Port-A-Cath is stable. No pneumothorax. Mild cardiomegaly. No other interval changes. IMPRESSION: Known mass in the left upper lobe. Other known pulmonary metastases not well visualized on this chest x-ray. No acute abnormalities. Electronically Signed   By: Dorise Bullion III M.D   On: 07/24/2018 16:01        Scheduled Meds: . sodium chloride   Intravenous Once  . citalopram  20 mg Oral Daily  . feeding supplement (ENSURE ENLIVE)  237 mL Oral BID BM  . loratadine  10 mg Oral Daily  . mometasone-formoterol  2 puff  Inhalation BID  . nicotine  21 mg Transdermal Daily  . opium-belladonna  1 suppository Rectal Q6H  . sodium bicarbonate  650 mg Oral BID  . sodium chloride flush  3 mL Intravenous Q12H  . tiotropium  18 mcg Inhalation Daily   Continuous Infusions: . sodium chloride    . sodium chloride 1,000 mL (07/26/18 0830)  . cefTRIAXone (ROCEPHIN)  IV Stopped (07/25/18  1434)     LOS: 2 days    Time spent: 40 mins    Irine Seal, MD [facility] Pager [phone]  If 7PM-7AM, please contact night-coverage [email or web address] Password [facility] 07/26/2018, 9:29 AM

## 2018-07-27 ENCOUNTER — Other Ambulatory Visit (HOSPITAL_COMMUNITY): Payer: Self-pay

## 2018-07-27 DIAGNOSIS — D649 Anemia, unspecified: Secondary | ICD-10-CM

## 2018-07-28 LAB — TYPE AND SCREEN
ABO/RH(D): O POS
ANTIBODY SCREEN: NEGATIVE
UNIT DIVISION: 0
Unit division: 0
Unit division: 0

## 2018-07-28 LAB — BPAM RBC
BLOOD PRODUCT EXPIRATION DATE: 201909132359
BLOOD PRODUCT EXPIRATION DATE: 201909132359
BLOOD PRODUCT EXPIRATION DATE: 201909142359
ISSUE DATE / TIME: 201908241831
ISSUE DATE / TIME: 201908241611
UNIT TYPE AND RH: 5100
UNIT TYPE AND RH: 9500
Unit Type and Rh: 5100

## 2018-07-29 ENCOUNTER — Ambulatory Visit (HOSPITAL_COMMUNITY): Payer: Medicare Other

## 2018-07-29 ENCOUNTER — Ambulatory Visit (HOSPITAL_COMMUNITY): Payer: Medicare Other | Admitting: Hematology

## 2018-07-29 ENCOUNTER — Inpatient Hospital Stay (HOSPITAL_COMMUNITY): Payer: Medicare Other

## 2018-07-29 ENCOUNTER — Encounter (HOSPITAL_COMMUNITY): Payer: Self-pay | Admitting: Hematology

## 2018-07-29 ENCOUNTER — Inpatient Hospital Stay (HOSPITAL_COMMUNITY): Payer: Medicare Other | Attending: Hematology | Admitting: Hematology

## 2018-07-29 ENCOUNTER — Other Ambulatory Visit: Payer: Self-pay

## 2018-07-29 ENCOUNTER — Other Ambulatory Visit (HOSPITAL_COMMUNITY): Payer: Medicare Other

## 2018-07-29 VITALS — BP 116/52 | HR 58 | Temp 97.6°F | Resp 18

## 2018-07-29 DIAGNOSIS — C679 Malignant neoplasm of bladder, unspecified: Secondary | ICD-10-CM

## 2018-07-29 DIAGNOSIS — Z936 Other artificial openings of urinary tract status: Secondary | ICD-10-CM | POA: Diagnosis not present

## 2018-07-29 DIAGNOSIS — C7800 Secondary malignant neoplasm of unspecified lung: Secondary | ICD-10-CM | POA: Insufficient documentation

## 2018-07-29 DIAGNOSIS — N183 Chronic kidney disease, stage 3 unspecified: Secondary | ICD-10-CM

## 2018-07-29 DIAGNOSIS — N3289 Other specified disorders of bladder: Secondary | ICD-10-CM | POA: Insufficient documentation

## 2018-07-29 DIAGNOSIS — D649 Anemia, unspecified: Secondary | ICD-10-CM | POA: Diagnosis not present

## 2018-07-29 DIAGNOSIS — C670 Malignant neoplasm of trigone of bladder: Secondary | ICD-10-CM | POA: Insufficient documentation

## 2018-07-29 DIAGNOSIS — Z720 Tobacco use: Secondary | ICD-10-CM | POA: Diagnosis not present

## 2018-07-29 DIAGNOSIS — N189 Chronic kidney disease, unspecified: Secondary | ICD-10-CM | POA: Diagnosis not present

## 2018-07-29 LAB — CBC WITH DIFFERENTIAL/PLATELET
Basophils Absolute: 0 10*3/uL (ref 0.0–0.1)
Basophils Relative: 0 %
EOS ABS: 0 10*3/uL (ref 0.0–0.7)
Eosinophils Relative: 0 %
HEMATOCRIT: 20.5 % — AB (ref 39.0–52.0)
HEMOGLOBIN: 6.7 g/dL — AB (ref 13.0–17.0)
LYMPHS ABS: 0.6 10*3/uL — AB (ref 0.7–4.0)
LYMPHS PCT: 9 %
MCH: 30.7 pg (ref 26.0–34.0)
MCHC: 32.7 g/dL (ref 30.0–36.0)
MCV: 94 fL (ref 78.0–100.0)
MONOS PCT: 7 %
Monocytes Absolute: 0.5 10*3/uL (ref 0.1–1.0)
NEUTROS ABS: 5.8 10*3/uL (ref 1.7–7.7)
Neutrophils Relative %: 84 %
Platelets: 199 10*3/uL (ref 150–400)
RBC: 2.18 MIL/uL — ABNORMAL LOW (ref 4.22–5.81)
RDW: 19.1 % — ABNORMAL HIGH (ref 11.5–15.5)
WBC: 6.9 10*3/uL (ref 4.0–10.5)

## 2018-07-29 LAB — COMPREHENSIVE METABOLIC PANEL
ALK PHOS: 58 U/L (ref 38–126)
ALT: 10 U/L (ref 0–44)
AST: 25 U/L (ref 15–41)
Albumin: 2.7 g/dL — ABNORMAL LOW (ref 3.5–5.0)
Anion gap: 8 (ref 5–15)
BILIRUBIN TOTAL: 0.6 mg/dL (ref 0.3–1.2)
BUN: 38 mg/dL — ABNORMAL HIGH (ref 8–23)
CALCIUM: 7.6 mg/dL — AB (ref 8.9–10.3)
CO2: 16 mmol/L — ABNORMAL LOW (ref 22–32)
CREATININE: 4.33 mg/dL — AB (ref 0.61–1.24)
Chloride: 112 mmol/L — ABNORMAL HIGH (ref 98–111)
GFR, EST AFRICAN AMERICAN: 15 mL/min — AB (ref >60)
GFR, EST NON AFRICAN AMERICAN: 13 mL/min — AB (ref >60)
Glucose, Bld: 93 mg/dL (ref 70–99)
Potassium: 4.8 mmol/L (ref 3.5–5.1)
Sodium: 136 mmol/L (ref 135–145)
TOTAL PROTEIN: 6.3 g/dL — AB (ref 6.5–8.1)

## 2018-07-29 LAB — PREPARE RBC (CROSSMATCH)

## 2018-07-29 MED ORDER — BELLADONNA ALKALOIDS-OPIUM 16.2-30 MG RE SUPP: 30.0000 mg | Freq: Three times a day (TID) | RECTAL | 0 refills | Status: DC | PRN

## 2018-07-29 NOTE — Progress Notes (Signed)
Carl Murphy, Carl Murphy [id number]   CLINIC:  Medical Oncology/Hematology  PCP:  Carl Murphy, [place] [address] [phone]   REASON FOR VISIT:  Follow-up for metastatic bladder cancer  CURRENT THERAPY: [name] every 3 weeks with neulasta injections  BRIEF ONCOLOGIC HISTORY:  Oncology History   Per available records had bladder tumor removed but had extension into the muscularis propria. CT showed no extension beyond the bladder. Has seen 2 urologists in consultation. Adamant about not having a cystectomy and ileal conduit.     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 No findings for local spread of disease or metastatic disease    03/25/2016 Surgery    TURBT 3cm right trigonal bladder tumor. followed by intravesical epirubicin instillation    03/25/2016 Pathology Results    bladder TURBT infiltrative high grade papillary urothelial carcinoma, invades the mulcularis propria (detrusor) no LVI     04/14/2016 Miscellaneous    [name] consultation in [place] for consideration of cystectomy. patient also offered repeat resection with BCG, and CHEMO/XRT    06/17/2016 Pathology Results    TURB- high grade papillary urothelial carcinoma with focal stromal invasion     Radiation Therapy    Planned to be completed on 09/10/2016.    07/22/2016 -  Chemotherapy    The patient had palonosetron (ALOXI) injection 0.25 mg, 0.25 mg, Intravenous,  Once, 2 of 5 cycles Administration: 0.25 mg (07/28/2016)  CARBOplatin (PARAPLATIN) 160 mg in sodium chloride 0.9 % 100 mL chemo infusion, 160 mg (100 % of original dose 160.6 mg), Intravenous,  Once, 2 of 5 cycles Dose modification:   (original dose 160.6 mg, Cycle 1),   (original dose 160.6 mg, Cycle 1),   (original dose 160.6 mg, Cycle 1),   (original dose 160.6 mg, Cycle  2) Administration: 180 mg (07/28/2016)  for chemotherapy treatment.      04/07/2017 Relapse/Recurrence    CT Abd/pelvis: IMPRESSION: 1. Recurrent bladder carcinoma with synchronous disease in the distal right ureter causing severe right hydronephrosis and decreased right renal function. 2. Bilateral renal stones. 3. Aortic atherosclerosis (ICD10-170.0). Coronary artery calcification. 4. Small periumbilical hernia contains a knuckle of unobstructed small bowel.     06/10/2017 PET scan    1. Metachronous urothelial carcinoma within the distal right ureter, as on prior CT. 2. No evidence of nodal metastasis. 3. Bilateral hypermetabolic pulmonary nodules, favoring pulmonary metastasis. Especially given emphysema, 1 or more primary bronchogenic carcinomas cannot be excluded but are felt less likely. 4. Coronary artery atherosclerosis. Aortic Atherosclerosis (ICD10-I70.0). 5. Bladder not well evaluated secondary to hypermetabolic urine within.     06/18/2017 Procedure    Placement of single lumen port a cath via right internal jugular vein. The catheter tip lies at the cavo-atrial junction. A power injectable port a cath was placed and is ready for immediate use.    06/29/2017 -  Chemotherapy    Carboplatin/Gemcitabine every 21 days     08/14/2017 Imaging    CT C/A/P IMPRESSION: 1. Overall stable pulmonary nodules. Knee soft tissue rim around the cavitary lesion in the left upper lobe is slightly thinner. No new pulmonary nodules or progressive findings in the chest. 2. Stable advanced emphysematous changes and pulmonary scarring. 3. No mediastinal or hilar mass or lymphadenopathy. 4. Improved CT appearance of the bladder. The asymmetric areas  of bladder wall enhancement have improved. There is still diffuse irregular bladder wall thickening and distal right ureteral tumor appears stable. Stable right-sided hydroureteronephrosis. 5. Stable cirrhotic changes involving the liver  and stable liver cysts. 6. Stable scattered mesenteric and retroperitoneal and pelvic lymph nodes but no mass or overt adenopathy. 7. Stable severe/advanced atherosclerotic calcifications involving the thoracic and abdominal aorta and branch vessels.    11/18/2017 Imaging    PET CT: IMPRESSION: 1. Significant partial metabolic response. Bilateral mildly hypermetabolic pulmonary metastases are decreased in size and metabolism. No new or progressive hypermetabolic metastatic disease. 2. Stable hypermetabolic distal right ureteral tumor with marked right hydroureteronephrosis. 3. Chronic findings include: Aortic Atherosclerosis (ICD10-I70.0) and Emphysema (ICD10-J43.9). Three-vessel coronary atherosclerosis. Stable small fat containing umbilical hernia.    03/02/2018 Imaging    Whole body PET CT scan  IMPRESSION: 1. Progressive pulmonary metastatic disease with enlarging and progressively hypermetabolic pulmonary nodules as described above. 2. New nodularity at the left lung base is hypermetabolic but could be an inflammatory or infectious process. 3. Stable hypermetabolic tumor in the right distal ureter. 4. No evidence of metastatic disease involving the abdomen/pelvis or osseous structures. 5. Chronic bilateral hydroureteronephrosis and asymmetric bladder wall thickening. 6. Stable severe atherosclerotic disease    03/08/2018 - 06/08/2018 Chemotherapy    The patient had pembrolizumab (KEYTRUDA) 200 mg in sodium chloride 0.9 % 50 mL chemo infusion, 200 mg, Intravenous, Once, 4 of 4 cycles Administration: 200 mg (03/09/2018), 200 mg (03/30/2018), 200 mg (04/28/2018), 200 mg (05/19/2018)  for chemotherapy treatment.     06/10/2018 -  Chemotherapy    The patient had ondansetron (ZOFRAN) 8 mg in sodium chloride 0.9 % 50 mL IVPB, , Intravenous,  Once, 1 of 4 cycles PACLitaxel-protein bound ([name]) chemo infusion 150 mg, 75 mg/m2 = 150 mg (100 % of original dose 75 mg/m2), Intravenous, Once,  1 of 4 cycles Dose modification: 75 mg/m2 (original dose 75 mg/m2, Cycle 1, Reason: Other (see comments)), 100 mg/m2 (original dose 75 mg/m2, Cycle 1, Reason: Other (see comments)), 75 mg/m2 (original dose 75 mg/m2, Cycle 1, Reason: Other (see comments)) Administration: 150 mg (06/11/2018), 150 mg (06/18/2018), 150 mg (06/25/2018)  for chemotherapy treatment.       CANCER STAGING: Cancer Staging Malignant neoplasm of trigone of bladder ([place]) Staging form: Urinary Bladder, AJCC 7th Edition - Clinical stage from 05/15/2016: Stage II (T2, N0, M0) - Signed by Baird Cancer, PA-C on 08/11/2016 - Pathologic stage from 06/29/2017: Stage IV (T2a, N0, M1) - Signed by Baird Cancer, PA-C on 06/29/2017    INTERVAL HISTORY:  Carl Murphy 72 y.o. male returns for routine follow-up for metastatic bladder cancer. Patient is here with his family. He is weak and unstable on his feet. He sleeps the majority of everyday. He has no energy. He left AMA from the hospital this week. He is here today wanting [place] to schedule his nephrostomy tubes exchange and order him medication for his bladder spasms. Patient has diarrhea with his bladder spasms. His nephrostomy tube became displaced and disconnected while he was at home and the hospital fitted him with temporary attachments until he could get them exchanged. Patient has pain in his lower back where the tubes are placed. Patient denies any nausea or vomiting.     REVIEW OF SYSTEMS:  Review of Systems  Constitutional: Positive for fatigue.  HENT:   Positive for mouth sores and trouble swallowing.   Cardiovascular: Positive for leg swelling.  Neurological: Positive for dizziness, extremity  weakness and numbness.  All other systems reviewed and are negative.    PAST MEDICAL/SURGICAL HISTORY:  Past Medical History:  Diagnosis Date  . Arthritis   . Cancer [facility])    Bladder Cancer  . CHF (congestive heart failure) ([place])   . COPD (chronic obstructive pulmonary  disease) ([place])   . Depression   . Hard of hearing    bilat   . Hypertension   . Malignant neoplasm of trigone of bladder ([place]) 03/25/2016  . PONV (postoperative nausea and vomiting)   . Shortness of breath dyspnea    heat; walking up set of stairs; pt states can walk up 12-14 stairs slowly without having to stop to catch his breath   Past Surgical History:  Procedure Laterality Date  . CYSTOSCOPY N/A 06/16/2016   Procedure: CYSTOSCOPY;  Surgeon: Franchot Gallo, MD;  Location: [facility];  Service: Urology;  Laterality: N/A;  . IR FLUORO GUIDE PORT INSERTION RIGHT  06/18/2017  . IR NEPHROSTOMY EXCHANGE LEFT  05/20/2018  . IR NEPHROSTOMY EXCHANGE RIGHT  05/20/2018  . IR NEPHROSTOMY PLACEMENT LEFT  03/25/2018  . IR NEPHROSTOMY PLACEMENT RIGHT  03/25/2018  . IR US GUIDE VASC ACCESS RIGHT  06/18/2017  . TRANSURETHRAL RESECTION OF BLADDER TUMOR N/A 03/25/2016   Procedure: TRANSURETHRAL RESECTION OF BLADDER TUMOR (TURBT);  Surgeon: Franchot Gallo, MD;  Location: [facility];  Service: Urology;  Laterality: N/A;  . TRANSURETHRAL RESECTION OF BLADDER TUMOR N/A 06/16/2016   Procedure: TRANSURETHRAL RESECTION OF BLADDER TUMOR (TURBT);  Surgeon: Franchot Gallo, MD;  Location: [facility];  Service: Urology;  Laterality: N/A;  . TRANSURETHRAL RESECTION OF BLADDER TUMOR N/A 12/16/2016   Procedure: TRANSURETHRAL RESECTION OF BLADDER TUMOR (TURBT);  Surgeon: Franchot Gallo, MD;  Location: [facility];  Service: Urology;  Laterality: N/A;  . TRANSURETHRAL RESECTION OF BLADDER TUMOR N/A 05/11/2017   Procedure: TRANSURETHRAL RESECTION OF BLADDER TUMOR (TURBT) CLOT EVACUATION;  Surgeon: Franchot Gallo, MD;  Location: [facility];  Service: Urology;  Laterality: N/A;  [place] BLUE MEDICARE-[id number]  . TRANSURETHRAL RESECTION OF BLADDER TUMOR N/A 04/01/2018   Procedure: TRANSURETHRAL RESECTION OF BLADDER TUMOR (TURBT);  Surgeon: Franchot Gallo, MD;  Location: [facility];  Service: Urology;  Laterality: N/A;  1  HR [phone] BLUE MEDICARE-[id number]     SOCIAL HISTORY:  Social History   Socioeconomic History  . Marital status: Married    Spouse name: Not on file  . Number of children: Not on file  . Years of education: Not on file  . Highest education level: Not on file  Occupational History  . Not on file  Social Needs  . Financial resource strain: Not on file  . Food insecurity:    Worry: Not on file    Inability: Not on file  . Transportation needs:    Medical: Not on file    Non-medical: Not on file  Tobacco Use  . Smoking status: Current Every Day Smoker    Packs/day: 2.00    Years: 50.00    Pack years: 100.00    Types: Cigarettes  . Smokeless tobacco: Never Used  Substance and Sexual Activity  . Alcohol use: Not Currently    Comment: occasionally  . Drug use: No  . Sexual activity: Never    Birth control/protection: None  Lifestyle  . Physical activity:    Days per week: Not on file    Minutes per session: Not on file  . Stress: Not on file  Relationships  . Social connections:  Talks on phone: Not on file    Gets together: Not on file    Attends religious service: Not on file    Active member of club or organization: Not on file    Attends meetings of clubs or organizations: Not on file    Relationship status: Not on file  . Intimate partner violence:    Fear of current or ex partner: Not on file    Emotionally abused: Not on file    Physically abused: Not on file    Forced sexual activity: Not on file  Other Topics Concern  . Not on file  Social History Narrative  . Not on file    FAMILY HISTORY:  Family History  Problem Relation Age of Onset  . Hypertension Mother   . Asthma Father   . Emphysema Father     CURRENT MEDICATIONS:  Outpatient Encounter Medications as of 07/29/2018  Medication Sig  . acetaminophen (TYLENOL) 500 MG tablet Take 1,000 mg by mouth daily.   Marland Kitchen amLODipine (NORVASC) 5 MG tablet Take 5 mg by mouth daily.   .  belladonna-opium (B&O SUPPRETTES) 16.2-30 MG suppository Place 1 suppository (30 mg total) rectally every 8 (eight) hours as needed for pain.  . carvedilol (COREG) 25 MG tablet Take 25 mg by mouth 2 (two) times daily.   . cetirizine (ZYRTEC) 10 MG tablet Take 10 mg by mouth daily.  . citalopram (CELEXA) 20 MG tablet Take 20 mg by mouth daily.   Marland Kitchen ENSURE (ENSURE) Take 237 mLs by mouth once a week.  . fluticasone (FLONASE) 50 MCG/ACT nasal spray Place 2 sprays into both nostrils daily as needed for allergies or rhinitis.  Marland Kitchen ipratropium-albuterol (DUONEB) 0.5-2.5 (3) MG/3ML SOLN Take 3 mLs by nebulization every 6 (six) hours as needed.  . lidocaine (XYLOCAINE) 2 % solution Use as directed 15 mLs in the mouth or throat every 6 (six) hours as needed for mouth pain.   Marland Kitchen oxybutynin (DITROPAN) 5 MG tablet Take 1 tablet (5 mg total) by mouth every 6 (six) hours as needed for bladder spasms. (Patient not taking: Reported on 07/24/2018)  . sucralfate (CARAFATE) 1 GM/10ML suspension Carafate 1gm/59m and Viscous lidocaine 2% 1:1 mixture. Swish and swallow 1 tablespoon four times a day (Patient not taking: Reported on 07/24/2018)  . VENTOLIN HFA 108 (90 Base) MCG/ACT inhaler Inhale 1-2 puffs into the lungs every 4 (four) hours as needed for wheezing or shortness of breath.    No facility-administered encounter medications on file as of 07/29/2018.     ALLERGIES:  Allergies  Allergen Reactions  . Carboplatin Shortness Of Breath    Reaction occurred after patient left Cancer Center. Pt reported extreme sob after leaving CC that eventually resolved on its own.     PHYSICAL EXAM:  ECOG Performance status: 2  Vitals:   07/29/18 1607  BP: (!) 116/52  Pulse: (!) 58  Resp: 18  Temp: 97.6 F (36.4 C)  SpO2: 100%    Physical Exam  Constitutional: He is oriented to person, place, and time.  Neurological: He is alert and oriented to person, place, and time.  Skin: Skin is warm and dry.  Psychiatric: He  has a normal mood and affect. His behavior is normal. Judgment and thought content normal.  Right nephrostomy tube is clear.  Left nephrostomy tube has slight blood in it.   LABORATORY DATA:  I have reviewed the labs as listed.  CBC    Component Value Date/Time   WBC  6.9 07/29/2018 1508   RBC 2.18 (L) 07/29/2018 1508   HGB 6.7 (LL) 07/29/2018 1508   HCT 20.5 (L) 07/29/2018 1508   PLT 199 07/29/2018 1508   MCV 94.0 07/29/2018 1508   MCH 30.7 07/29/2018 1508   MCHC 32.7 07/29/2018 1508   RDW 19.1 (H) 07/29/2018 1508   LYMPHSABS 0.6 (L) 07/29/2018 1508   MONOABS 0.5 07/29/2018 1508   EOSABS 0.0 07/29/2018 1508   BASOSABS 0.0 07/29/2018 1508   CMP Latest Ref Rng & Units 07/29/2018 07/26/2018 07/25/2018  Glucose 70 - 99 mg/dL 93 79 80  BUN 8 - 23 mg/dL 38(H) 47(H) 58(H)  Creatinine 0.61 - 1.24 mg/dL 4.33(H) 4.31(H) 4.84(H)  Sodium 135 - 145 mmol/L 136 139 139  Potassium 3.5 - 5.1 mmol/L 4.8 4.4 4.6  Chloride 98 - 111 mmol/L 112(H) 114(H) 115(H)  CO2 22 - 32 mmol/L 16(L) 17(L) 17(L)  Calcium 8.9 - 10.3 mg/dL 7.6(L) 7.1(L) 7.2(L)  Total Protein 6.5 - 8.1 g/dL 6.3(L) - -  Total Bilirubin 0.3 - 1.2 mg/dL 0.6 - -  Alkaline Phos 38 - 126 U/L 58 - -  AST 15 - 41 U/L 25 - -  ALT 0 - 44 U/L 10 - -       DIAGNOSTIC IMAGING:  I have independently reviewed CT scan images dated 07/25/2018 and discussed with the patient, and his daughter.    ASSESSMENT & PLAN:   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  Stage IV bladder cancer with lung metastasis (DX 06/29/2017): Foundation 1 shows MS-stable, TMB-intermediate,HRAS-G12S (clinical trails with binimetinib, cobimetinib, trametinib) - Carboplatin and radiation therapy from 07/22/2016 through 09/08/2016 for local disease -Status post 6 cycles of carboplatin and gemcitabine completed on 11/02/2017 after development of metastatic disease -PET/CT scan on 11/18/2017 showing significant partial metabolic response -Maintenance gemcitabine  day 1 and day 15 every 28 days started on 12/03/2017, PET CT scan showing worsening of pulmonary metastatic disease - CT scan of the chest on 04/20/2018 which showed no evidence of pneumonitis.  Left upper lobe cavitary nodule has slightly increased in size measuring 1.8 x 2.3 cm.  This was measuring 1.6 x 1.7 cm on previous PET scan on 03/01/2018.  Right upper lobe nodule was stable.  Right middle lobe lung nodule increased to 1.4 from 1 cm.  This was done for increased shortness of breath, which improved with inhalers prescribed by his PMD.  I did not think the findings on the CT scan represented progression as the increase in size was too small and a CT with contrast was compared to PET/CT without contrast. - He underwent TURBT on 04/01/2018, biopsy was consistent with invasive high-grade papillary urothelial carcinoma. - 4 cycles of Keytruda from 03/09/2018 through 05/19/2018. -I reviewed the results of the CT scan of the chest, abdomen and pelvis dated 06/07/2018 which showed increase in largest of the lung metastasis by 1 cm and several other lesions by 5 to 7 mm. - [name] at 75 mg/m day 1, 8, 15 every 28 days started on 06/11/2018.  He felt very tired and had one fall since then.  He received 1 unit of PRBC on 06/29/2018. - Occasional cycle 2 was held because of severe weakness and anemia. - He was recently admitted to the hospital with severe anemia.  He received 2 units of blood transfusion.  He signed out AMA from the hospital on 07/26/2018. -I have reviewed CT of the abdomen and pelvis dated 07/25/2018 which showed stable disease in the  right ureter.  Left upper lobe lung nodule was 1.8 cm, previously 1.9 cm. - I have talked to the patient about best supportive care in the form of hospice versus continuing chemotherapy.  Patient is a week or and spends most of the time in the recliner.  Patient would like to continue active treatments. -I will put chemotherapy on hold at this time.  We will make a referral  to interventional radiology for nephrostomy tube changes, as he is past due for it. -I will also talk to Dr. Diona Fanti to see if further TURBT will help reduce the bleeding.  We will give him belladonna/opium suppositories to help with the spasms.  He is using oxybutynin which is not helping much. - For his severe anemia, we will transfuse him 2 units of blood tomorrow.  2.  CKD:He underwent bilateral percutaneous nephrostomy tube placement on 03/25/2018.  He underwent TURBT on 04/01/2018.  Right nephrostomy tube is not putting out much.  Left nephrostomy tube is draining well.  He is past due for his nephrostomy tube changes.  We will call interventional radiology to expedite the process.  3.  Anemia: He received 2 Feraheme infusions on 05/19/2018 and 06/11/2018.  Today's hemoglobin is 6.7.  I will arrange for 2 units of blood transfusion.  4.  Bladder spasms: - Prescription for belladonna/opium 30 mg was given to improve the spasms.  He will continue oxybutynin.    Total time spent is 40 minutes with more than 50% of the time spent face-to-face discussing scan results, further plan of action and coordination of care.    Orders placed this encounter:  Orders Placed This Encounter  Procedures  . IR Nephrostomy Tube Change  . Type and screen  . Prepare RBC      Carl Jack, MD [place] [phone]

## 2018-07-29 NOTE — Assessment & Plan Note (Signed)
1.  Stage IV bladder cancer with lung metastasis (DX 06/29/2017): Foundation 1 shows MS-stable, TMB-intermediate,HRAS-G12S (clinical trails with binimetinib, cobimetinib, trametinib) - Carboplatin and radiation therapy from 07/22/2016 through 09/08/2016 for local disease -Status post 6 cycles of carboplatin and gemcitabine completed on 11/02/2017 after development of metastatic disease -PET/CT scan on 11/18/2017 showing significant partial metabolic response -Maintenance gemcitabine day 1 and day 15 every 28 days started on 12/03/2017, PET CT scan showing worsening of pulmonary metastatic disease - CT scan of the chest on 04/20/2018 which showed no evidence of pneumonitis.  Left upper lobe cavitary nodule has slightly increased in size measuring 1.8 x 2.3 cm.  This was measuring 1.6 x 1.7 cm on previous PET scan on 03/01/2018.  Right upper lobe nodule was stable.  Right middle lobe lung nodule increased to 1.4 from 1 cm.  This was done for increased shortness of breath, which improved with inhalers prescribed by his PMD.  I did not think the findings on the CT scan represented progression as the increase in size was too small and a CT with contrast was compared to PET/CT without contrast. - He underwent TURBT on 04/01/2018, biopsy was consistent with invasive high-grade papillary urothelial carcinoma. - 4 cycles of Keytruda from 03/09/2018 through 05/19/2018. -I reviewed the results of the CT scan of the chest, abdomen and pelvis dated 06/07/2018 which showed increase in largest of the lung metastasis by 1 cm and several other lesions by 5 to 7 mm. - [name] at 75 mg/m day 1, 8, 15 every 28 days started on 06/11/2018.  He felt very tired and had one fall since then.  He received 1 unit of PRBC on 06/29/2018. - Occasional cycle 2 was held because of severe weakness and anemia. - He was recently admitted to the hospital with severe anemia.  He received 2 units of blood transfusion.  He signed out AMA from the hospital on  07/26/2018. -I have reviewed CT of the abdomen and pelvis dated 07/25/2018 which showed stable disease in the right ureter.  Left upper lobe lung nodule was 1.8 cm, previously 1.9 cm. - I have talked to the patient about best supportive care in the form of hospice versus continuing chemotherapy.  Patient is a week or and spends most of the time in the recliner.  Patient would like to continue active treatments. -I will put chemotherapy on hold at this time.  We will make a referral to interventional radiology for nephrostomy tube changes, as he is past due for it. -I will also talk to Dr. Diona Fanti to see if further TURBT will help reduce the bleeding.  We will give him belladonna/opium suppositories to help with the spasms.  He is using oxybutynin which is not helping much. - For his severe anemia, we will transfuse him 2 units of blood tomorrow.  2.  CKD:He underwent bilateral percutaneous nephrostomy tube placement on 03/25/2018.  He underwent TURBT on 04/01/2018.  Right nephrostomy tube is not putting out much.  Left nephrostomy tube is draining well.  He is past due for his nephrostomy tube changes.  We will call interventional radiology to expedite the process.  3.  Anemia: He received 2 Feraheme infusions on 05/19/2018 and 06/11/2018.  Today's hemoglobin is 6.7.  I will arrange for 2 units of blood transfusion.  4.  Bladder spasms: - Prescription for belladonna/opium 30 mg was given to improve the spasms.  He will continue oxybutynin.

## 2018-07-29 NOTE — Patient Instructions (Signed)
[facility] Discharge Instructions     Thank you for choosing [facility] at [facility] to provide your oncology and hematology care.  To afford each patient quality time with our provider, please arrive at least 15 minutes before your scheduled appointment time.   If you have a lab appointment with the Cancer Center please come in thru the  Main Entrance and check in at the main information desk  You need to re-schedule your appointment should you arrive 10 or more minutes late.  We strive to give you quality time with our providers, and arriving late affects you and other patients whose appointments are after yours.  Also, if you no show three or more times for appointments you may be dismissed from the clinic at the providers discretion.     Again, thank you for choosing [facility].  Our hope is that these requests will decrease the amount of time that you wait before being seen by our physicians.       _____________________________________________________________  Should you have questions after your visit to [facility], please contact our office at [phone] between the hours of 8:00 a.m. and 4:30 p.m.  Voicemails left after 4:00 p.m. will not be returned until the following business day.  For prescription refill requests, have your pharmacy contact our office and allow 72 hours.    Cancer Center Support Programs:   > Cancer Support Group  2nd Tuesday of the month 1pm-2pm, Journey Room    

## 2018-07-30 ENCOUNTER — Inpatient Hospital Stay (HOSPITAL_COMMUNITY): Payer: Medicare Other

## 2018-07-30 ENCOUNTER — Other Ambulatory Visit (HOSPITAL_COMMUNITY): Payer: Self-pay | Admitting: Hematology

## 2018-07-30 DIAGNOSIS — C670 Malignant neoplasm of trigone of bladder: Secondary | ICD-10-CM | POA: Diagnosis not present

## 2018-07-30 DIAGNOSIS — N183 Chronic kidney disease, stage 3 unspecified: Secondary | ICD-10-CM

## 2018-07-30 DIAGNOSIS — C679 Malignant neoplasm of bladder, unspecified: Secondary | ICD-10-CM

## 2018-07-30 MED ORDER — DIPHENHYDRAMINE HCL 25 MG PO CAPS
ORAL_CAPSULE | ORAL | Status: AC
  Filled 2018-07-30: qty 1

## 2018-07-30 MED ORDER — BELLADONNA ALKALOIDS-OPIUM 16.2-30 MG RE SUPP: RECTAL | 0 refills | Status: AC

## 2018-07-30 MED ORDER — HEPARIN SOD (PORK) LOCK FLUSH 100 UNIT/ML IV SOLN
500.0000 [IU] | Freq: Every day | INTRAVENOUS | Status: AC | PRN
  Administered 2018-07-30: 500 [IU]

## 2018-07-30 MED ORDER — DIPHENHYDRAMINE HCL 25 MG PO CAPS
25.0000 mg | ORAL_CAPSULE | Freq: Once | ORAL | Status: AC
  Administered 2018-07-30: 25 mg via ORAL

## 2018-07-30 MED ORDER — SODIUM CHLORIDE 0.9% FLUSH
10.0000 mL | INTRAVENOUS | Status: AC | PRN
  Administered 2018-07-30: 10 mL

## 2018-07-30 MED ORDER — SODIUM CHLORIDE 0.9% IV SOLUTION
250.0000 mL | Freq: Once | INTRAVENOUS | Status: AC
  Administered 2018-07-30: 250 mL via INTRAVENOUS

## 2018-07-30 MED ORDER — ACETAMINOPHEN 325 MG PO TABS
ORAL_TABLET | ORAL | Status: AC
  Filled 2018-07-30: qty 2

## 2018-07-30 MED ORDER — ACETAMINOPHEN 325 MG PO TABS
650.0000 mg | ORAL_TABLET | Freq: Once | ORAL | Status: AC
  Administered 2018-07-30: 650 mg via ORAL

## 2018-07-30 MED ORDER — BELLADONNA ALKALOIDS-OPIUM 16.2-30 MG RE SUPP: 60.0000 mg | Freq: Three times a day (TID) | RECTAL | 0 refills | Status: DC | PRN

## 2018-07-30 NOTE — Patient Instructions (Signed)
[place] at [facility] Discharge Instructions  2 units of blood given today Follow up as scheduled.    Thank you for choosing [place] at [facility] to provide your oncology and hematology care.  To afford each patient quality time with our provider, please arrive at least 15 minutes before your scheduled appointment time.   If you have a lab appointment with the [facility] please come in thru the  Main Entrance and check in at the main information desk  You need to re-schedule your appointment should you arrive 10 or more minutes late.  We strive to give you quality time with our providers, and arriving late affects you and other patients whose appointments are after yours.  Also, if you no show three or more times for appointments you may be dismissed from the clinic at the providers discretion.     Again, thank you for choosing [facility].  Our hope is that these requests will decrease the amount of time that you wait before being seen by our physicians.       _____________________________________________________________  Should you have questions after your visit to [facility], please contact our office at [phone] between the hours of 8:00 a.m. and 4:30 p.m.  Voicemails left after 4:00 p.m. will not be returned until the following business day.  For prescription refill requests, have your pharmacy contact our office and allow 72 hours.    Cancer Center Support Programs:   > Cancer Support Group  2nd Tuesday of the month 1pm-2pm, Journey Room

## 2018-07-30 NOTE — Progress Notes (Signed)
2 units of blood given today as ordered.  Treatment given per orders. Patient tolerated it well without problems. Vitals stable and discharged home from clinic via wheelchair with daughter. Follow up as scheduled.

## 2018-07-31 LAB — BPAM RBC
BLOOD PRODUCT EXPIRATION DATE: 201910012359
BLOOD PRODUCT EXPIRATION DATE: 201910022359
ISSUE DATE / TIME: 201908301127
ISSUE DATE / TIME: 201908301338
UNIT TYPE AND RH: 5100
UNIT TYPE AND RH: 5100

## 2018-07-31 LAB — TYPE AND SCREEN
ABO/RH(D): O POS
ANTIBODY SCREEN: NEGATIVE
Unit division: 0
Unit division: 0

## 2018-08-03 ENCOUNTER — Encounter (HOSPITAL_COMMUNITY): Payer: Self-pay | Admitting: Interventional Radiology

## 2018-08-03 ENCOUNTER — Other Ambulatory Visit (HOSPITAL_COMMUNITY): Payer: Self-pay | Admitting: Nurse Practitioner

## 2018-08-03 ENCOUNTER — Ambulatory Visit (HOSPITAL_COMMUNITY)
Admission: RE | Admit: 2018-08-03 | Discharge: 2018-08-03 | Disposition: A | Discharge status: Home / Self Care | Payer: Medicare Other | Source: Ambulatory Visit | Attending: Nurse Practitioner | Admitting: Nurse Practitioner

## 2018-08-03 ENCOUNTER — Other Ambulatory Visit (HOSPITAL_COMMUNITY): Payer: Self-pay | Admitting: Diagnostic Radiology

## 2018-08-03 ENCOUNTER — Other Ambulatory Visit (HOSPITAL_COMMUNITY): Payer: Self-pay | Admitting: Interventional Radiology

## 2018-08-03 DIAGNOSIS — N1330 Unspecified hydronephrosis: Secondary | ICD-10-CM

## 2018-08-03 DIAGNOSIS — Z436 Encounter for attention to other artificial openings of urinary tract: Secondary | ICD-10-CM | POA: Diagnosis present

## 2018-08-03 DIAGNOSIS — N183 Chronic kidney disease, stage 3 unspecified: Secondary | ICD-10-CM

## 2018-08-03 DIAGNOSIS — C679 Malignant neoplasm of bladder, unspecified: Secondary | ICD-10-CM

## 2018-08-03 DIAGNOSIS — N135 Crossing vessel and stricture of ureter without hydronephrosis: Secondary | ICD-10-CM | POA: Insufficient documentation

## 2018-08-03 HISTORY — PX: IR NEPHROSTOMY EXCHANGE LEFT: IMG6069

## 2018-08-03 HISTORY — PX: IR NEPHROSTOMY EXCHANGE RIGHT: IMG6070

## 2018-08-03 MED ORDER — IOPAMIDOL (ISOVUE-300) INJECTION 61%
INTRAVENOUS | Status: AC
  Administered 2018-08-03: 15 mL
  Filled 2018-08-03: qty 50

## 2018-08-03 MED ORDER — IOPAMIDOL (ISOVUE-300) INJECTION 61%
15.0000 mL | Freq: Once | INTRAVENOUS | Status: AC | PRN
  Administered 2018-08-03: 15 mL

## 2018-08-03 MED ORDER — LIDOCAINE HCL 1 % IJ SOLN
INTRAMUSCULAR | Status: AC | PRN
  Administered 2018-08-03: 10 mL

## 2018-08-03 MED ORDER — LIDOCAINE HCL 1 % IJ SOLN
INTRAMUSCULAR | Status: AC
  Filled 2018-08-03: qty 20

## 2018-08-03 NOTE — Procedures (Signed)
Interventional Radiology Procedure Note  Procedure: routine exchange of bil PCN.  New 71F tubes placed to gravity.  Complications: None Recommendations:  - Ok for DC now - Do not submerge  - Routine care   Signed,  Dulcy Fanny. Earleen Newport, DO

## 2018-08-05 ENCOUNTER — Other Ambulatory Visit (HOSPITAL_COMMUNITY): Payer: Self-pay | Admitting: Hematology

## 2018-08-09 ENCOUNTER — Encounter (HOSPITAL_COMMUNITY): Payer: Self-pay

## 2018-08-09 ENCOUNTER — Other Ambulatory Visit: Payer: Self-pay

## 2018-08-09 ENCOUNTER — Emergency Department (HOSPITAL_COMMUNITY): Payer: Medicare Other

## 2018-08-09 ENCOUNTER — Inpatient Hospital Stay (HOSPITAL_COMMUNITY): Payer: Medicare Other | Attending: Hematology

## 2018-08-09 ENCOUNTER — Inpatient Hospital Stay (HOSPITAL_COMMUNITY)
Admission: EM | Admit: 2018-08-09 | Discharge: 2018-08-17 | DRG: 668 | Disposition: A | Discharge status: Hospice | Payer: Medicare Other | Attending: Urology | Admitting: Urology

## 2018-08-09 DIAGNOSIS — Z6824 Body mass index (BMI) 24.0-24.9, adult: Secondary | ICD-10-CM

## 2018-08-09 DIAGNOSIS — N184 Chronic kidney disease, stage 4 (severe): Secondary | ICD-10-CM | POA: Diagnosis present

## 2018-08-09 DIAGNOSIS — G893 Neoplasm related pain (acute) (chronic): Secondary | ICD-10-CM

## 2018-08-09 DIAGNOSIS — E440 Moderate protein-calorie malnutrition: Secondary | ICD-10-CM | POA: Diagnosis present

## 2018-08-09 DIAGNOSIS — D62 Acute posthemorrhagic anemia: Secondary | ICD-10-CM | POA: Diagnosis not present

## 2018-08-09 DIAGNOSIS — Z79899 Other long term (current) drug therapy: Secondary | ICD-10-CM

## 2018-08-09 DIAGNOSIS — N189 Chronic kidney disease, unspecified: Secondary | ICD-10-CM | POA: Insufficient documentation

## 2018-08-09 DIAGNOSIS — B952 Enterococcus as the cause of diseases classified elsewhere: Secondary | ICD-10-CM | POA: Diagnosis present

## 2018-08-09 DIAGNOSIS — R0602 Shortness of breath: Secondary | ICD-10-CM

## 2018-08-09 DIAGNOSIS — C670 Malignant neoplasm of trigone of bladder: Secondary | ICD-10-CM

## 2018-08-09 DIAGNOSIS — N179 Acute kidney failure, unspecified: Secondary | ICD-10-CM

## 2018-08-09 DIAGNOSIS — F329 Major depressive disorder, single episode, unspecified: Secondary | ICD-10-CM | POA: Diagnosis present

## 2018-08-09 DIAGNOSIS — N1330 Unspecified hydronephrosis: Secondary | ICD-10-CM | POA: Diagnosis present

## 2018-08-09 DIAGNOSIS — J440 Chronic obstructive pulmonary disease with acute lower respiratory infection: Secondary | ICD-10-CM

## 2018-08-09 DIAGNOSIS — J9601 Acute respiratory failure with hypoxia: Secondary | ICD-10-CM | POA: Diagnosis present

## 2018-08-09 DIAGNOSIS — T827XXA Infection and inflammatory reaction due to other cardiac and vascular devices, implants and grafts, initial encounter: Secondary | ICD-10-CM

## 2018-08-09 DIAGNOSIS — C679 Malignant neoplasm of bladder, unspecified: Secondary | ICD-10-CM

## 2018-08-09 DIAGNOSIS — Z888 Allergy status to other drugs, medicaments and biological substances status: Secondary | ICD-10-CM

## 2018-08-09 DIAGNOSIS — F17210 Nicotine dependence, cigarettes, uncomplicated: Secondary | ICD-10-CM | POA: Diagnosis present

## 2018-08-09 DIAGNOSIS — R310 Gross hematuria: Secondary | ICD-10-CM | POA: Diagnosis not present

## 2018-08-09 DIAGNOSIS — R7881 Bacteremia: Secondary | ICD-10-CM | POA: Diagnosis present

## 2018-08-09 DIAGNOSIS — N028 Recurrent and persistent hematuria with other morphologic changes: Principal | ICD-10-CM | POA: Diagnosis present

## 2018-08-09 DIAGNOSIS — D649 Anemia, unspecified: Secondary | ICD-10-CM

## 2018-08-09 DIAGNOSIS — C7800 Secondary malignant neoplasm of unspecified lung: Secondary | ICD-10-CM | POA: Diagnosis present

## 2018-08-09 DIAGNOSIS — Z936 Other artificial openings of urinary tract status: Secondary | ICD-10-CM

## 2018-08-09 DIAGNOSIS — J449 Chronic obstructive pulmonary disease, unspecified: Secondary | ICD-10-CM | POA: Diagnosis present

## 2018-08-09 DIAGNOSIS — B965 Pseudomonas (aeruginosa) (mallei) (pseudomallei) as the cause of diseases classified elsewhere: Secondary | ICD-10-CM | POA: Diagnosis present

## 2018-08-09 DIAGNOSIS — F17200 Nicotine dependence, unspecified, uncomplicated: Secondary | ICD-10-CM

## 2018-08-09 DIAGNOSIS — Z515 Encounter for palliative care: Secondary | ICD-10-CM

## 2018-08-09 DIAGNOSIS — G92 Toxic encephalopathy: Secondary | ICD-10-CM | POA: Diagnosis present

## 2018-08-09 DIAGNOSIS — I129 Hypertensive chronic kidney disease with stage 1 through stage 4 chronic kidney disease, or unspecified chronic kidney disease: Secondary | ICD-10-CM | POA: Diagnosis present

## 2018-08-09 DIAGNOSIS — D631 Anemia in chronic kidney disease: Secondary | ICD-10-CM

## 2018-08-09 DIAGNOSIS — Z66 Do not resuscitate: Secondary | ICD-10-CM | POA: Diagnosis present

## 2018-08-09 DIAGNOSIS — J209 Acute bronchitis, unspecified: Secondary | ICD-10-CM

## 2018-08-09 DIAGNOSIS — Z7951 Long term (current) use of inhaled steroids: Secondary | ICD-10-CM

## 2018-08-09 DIAGNOSIS — H9190 Unspecified hearing loss, unspecified ear: Secondary | ICD-10-CM | POA: Diagnosis present

## 2018-08-09 LAB — CBC WITH DIFFERENTIAL/PLATELET
BASOS PCT: 0 %
Basophils Absolute: 0 10*3/uL (ref 0.0–0.1)
Basophils Absolute: 0 10*3/uL (ref 0.0–0.1)
Basophils Relative: 0 %
EOS ABS: 0 10*3/uL (ref 0.0–0.7)
EOS PCT: 0 %
EOS PCT: 0 %
Eosinophils Absolute: 0 10*3/uL (ref 0.0–0.7)
HEMATOCRIT: 13.2 % — AB (ref 39.0–52.0)
HEMATOCRIT: 12.2 % — AB (ref 39.0–52.0)
Hemoglobin: 4.3 g/dL — CL (ref 13.0–17.0)
Hemoglobin: 3.9 g/dL — CL (ref 13.0–17.0)
LYMPHS PCT: 13 %
LYMPHS PCT: 12 %
Lymphs Abs: 0.6 10*3/uL — ABNORMAL LOW (ref 0.7–4.0)
Lymphs Abs: 0.6 10*3/uL — ABNORMAL LOW (ref 0.7–4.0)
MCH: 31.4 pg (ref 26.0–34.0)
MCH: 30.7 pg (ref 26.0–34.0)
MCHC: 32.6 g/dL (ref 30.0–36.0)
MCHC: 32 g/dL (ref 30.0–36.0)
MCV: 96.4 fL (ref 78.0–100.0)
MCV: 96.1 fL (ref 78.0–100.0)
MONO ABS: 0.3 10*3/uL (ref 0.1–1.0)
MONO ABS: 0.3 10*3/uL (ref 0.1–1.0)
MONOS PCT: 7 %
Monocytes Relative: 7 %
NEUTROS PCT: 81 %
Neutro Abs: 3.6 10*3/uL (ref 1.7–7.7)
Neutro Abs: 3.8 10*3/uL (ref 1.7–7.7)
Neutrophils Relative %: 80 %
PLATELETS: 167 10*3/uL (ref 150–400)
Platelets: 182 10*3/uL (ref 150–400)
RBC: 1.37 MIL/uL — AB (ref 4.22–5.81)
RBC: 1.27 MIL/uL — AB (ref 4.22–5.81)
RDW: 20.7 % — ABNORMAL HIGH (ref 11.5–15.5)
RDW: 20.7 % — ABNORMAL HIGH (ref 11.5–15.5)
WBC: 4.5 10*3/uL (ref 4.0–10.5)
WBC: 4.7 10*3/uL (ref 4.0–10.5)

## 2018-08-09 LAB — COMPREHENSIVE METABOLIC PANEL
ALT: 9 U/L (ref 0–44)
ANION GAP: 7 (ref 5–15)
AST: 20 U/L (ref 15–41)
Albumin: 2.4 g/dL — ABNORMAL LOW (ref 3.5–5.0)
Alkaline Phosphatase: 54 U/L (ref 38–126)
BUN: 44 mg/dL — ABNORMAL HIGH (ref 8–23)
CHLORIDE: 112 mmol/L — AB (ref 98–111)
CO2: 17 mmol/L — ABNORMAL LOW (ref 22–32)
Calcium: 7 mg/dL — ABNORMAL LOW (ref 8.9–10.3)
Creatinine, Ser: 5.11 mg/dL — ABNORMAL HIGH (ref 0.61–1.24)
GFR, EST AFRICAN AMERICAN: 12 mL/min — AB (ref >60)
GFR, EST NON AFRICAN AMERICAN: 10 mL/min — AB (ref >60)
Glucose, Bld: 104 mg/dL — ABNORMAL HIGH (ref 70–99)
POTASSIUM: 4.8 mmol/L (ref 3.5–5.1)
Sodium: 136 mmol/L (ref 135–145)
Total Bilirubin: 0.5 mg/dL (ref 0.3–1.2)
Total Protein: 5.7 g/dL — ABNORMAL LOW (ref 6.5–8.1)

## 2018-08-09 LAB — PROTIME-INR
INR: 1.03
Prothrombin Time: 13.4 seconds (ref 11.4–15.2)

## 2018-08-09 LAB — SAMPLE TO BLOOD BANK

## 2018-08-09 LAB — LACTIC ACID, PLASMA: LACTIC ACID, VENOUS: 1.6 mmol/L (ref 0.5–1.9)

## 2018-08-09 LAB — PREPARE RBC (CROSSMATCH)

## 2018-08-09 MED ORDER — FUROSEMIDE 10 MG/ML IJ SOLN: 60.0000 mg | Freq: Once | INTRAMUSCULAR | Status: DC

## 2018-08-09 MED ORDER — SODIUM CHLORIDE 0.9% FLUSH: 3.0000 mL | INTRAVENOUS | Status: DC | PRN

## 2018-08-09 MED ORDER — ALBUTEROL SULFATE (2.5 MG/3ML) 0.083% IN NEBU: 3.0000 mL | INHALATION_SOLUTION | RESPIRATORY_TRACT | Status: DC | PRN

## 2018-08-09 MED ORDER — INFLUENZA VAC SPLIT HIGH-DOSE 0.5 ML IM SUSY
0.5000 mL | PREFILLED_SYRINGE | INTRAMUSCULAR | Status: DC
  Filled 2018-08-09: qty 0.5

## 2018-08-09 MED ORDER — ONDANSETRON HCL 4 MG/2ML IJ SOLN
4.0000 mg | Freq: Four times a day (QID) | INTRAMUSCULAR | Status: DC | PRN
  Administered 2018-08-13 (×2): 4 mg via INTRAVENOUS
  Filled 2018-08-09 (×2): qty 2

## 2018-08-09 MED ORDER — BELLADONNA ALKALOIDS-OPIUM 16.2-30 MG RE SUPP
30.0000 mg | Freq: Four times a day (QID) | RECTAL | Status: DC | PRN
  Administered 2018-08-10: 30 mg via RECTAL
  Filled 2018-08-09 (×3): qty 1

## 2018-08-09 MED ORDER — OXYCODONE-ACETAMINOPHEN 5-325 MG PO TABS
1.0000 | ORAL_TABLET | Freq: Four times a day (QID) | ORAL | Status: DC | PRN
  Administered 2018-08-09 – 2018-08-11 (×3): 2 via ORAL
  Filled 2018-08-09 (×3): qty 2

## 2018-08-09 MED ORDER — LIDOCAINE VISCOUS HCL 2 % MT SOLN
15.0000 mL | Freq: Four times a day (QID) | OROMUCOSAL | Status: DC | PRN
  Administered 2018-08-09 – 2018-08-11 (×2): 15 mL via OROMUCOSAL
  Filled 2018-08-09 (×3): qty 15

## 2018-08-09 MED ORDER — ONDANSETRON HCL 4 MG PO TABS
4.0000 mg | ORAL_TABLET | Freq: Four times a day (QID) | ORAL | Status: DC | PRN
  Administered 2018-08-12: 4 mg via ORAL
  Filled 2018-08-09 (×2): qty 1

## 2018-08-09 MED ORDER — SODIUM CHLORIDE 0.9% FLUSH
3.0000 mL | Freq: Two times a day (BID) | INTRAVENOUS | Status: DC
  Administered 2018-08-09 – 2018-08-17 (×11): 3 mL via INTRAVENOUS

## 2018-08-09 MED ORDER — FENTANYL CITRATE (PF) 100 MCG/2ML IJ SOLN
25.0000 ug | Freq: Once | INTRAMUSCULAR | Status: AC
  Administered 2018-08-09: 25 ug via INTRAVENOUS
  Filled 2018-08-09: qty 2

## 2018-08-09 MED ORDER — ENSURE ENLIVE PO LIQD: 237.0000 mL | Freq: Two times a day (BID) | ORAL | Status: DC

## 2018-08-09 MED ORDER — SUCRALFATE 1 GM/10ML PO SUSP
1.0000 g | Freq: Three times a day (TID) | ORAL | Status: DC
  Administered 2018-08-10 – 2018-08-12 (×10): 1 g via ORAL
  Filled 2018-08-09 (×9): qty 10

## 2018-08-09 MED ORDER — SODIUM CHLORIDE 0.9% IV SOLUTION
Freq: Once | INTRAVENOUS | Status: AC
  Administered 2018-08-16: 09:00:00 via INTRAVENOUS

## 2018-08-09 MED ORDER — IPRATROPIUM-ALBUTEROL 0.5-2.5 (3) MG/3ML IN SOLN
3.0000 mL | Freq: Four times a day (QID) | RESPIRATORY_TRACT | Status: DC
  Administered 2018-08-09 – 2018-08-10 (×2): 3 mL via RESPIRATORY_TRACT
  Filled 2018-08-09 (×2): qty 3

## 2018-08-09 MED ORDER — SODIUM CHLORIDE 0.9 % IV SOLN
10.0000 mL/h | Freq: Once | INTRAVENOUS | Status: AC
  Administered 2018-08-09: 10 mL/h via INTRAVENOUS

## 2018-08-09 MED ORDER — CITALOPRAM HYDROBROMIDE 20 MG PO TABS
20.0000 mg | ORAL_TABLET | Freq: Every day | ORAL | Status: DC
  Administered 2018-08-10 – 2018-08-12 (×3): 20 mg via ORAL
  Filled 2018-08-09 (×3): qty 1

## 2018-08-09 MED ORDER — ACETAMINOPHEN 500 MG PO TABS: 1000.0000 mg | ORAL_TABLET | Freq: Four times a day (QID) | ORAL | Status: DC | PRN

## 2018-08-09 MED ORDER — NICOTINE 14 MG/24HR TD PT24
14.0000 mg | MEDICATED_PATCH | Freq: Once | TRANSDERMAL | Status: AC
  Administered 2018-08-09: 14 mg via TRANSDERMAL
  Filled 2018-08-09: qty 1

## 2018-08-09 MED ORDER — SODIUM CHLORIDE 0.9 % IV SOLN
250.0000 mL | INTRAVENOUS | Status: DC | PRN
  Administered 2018-08-10: 250 mL via INTRAVENOUS
  Administered 2018-08-13: 14:00:00 via INTRAVENOUS
  Administered 2018-08-15 – 2018-08-17 (×3): 250 mL via INTRAVENOUS

## 2018-08-09 NOTE — H&P (Signed)
[facility] History and Physical  Carl Murphy YIR:[id number] DOB: 05-16-1946 DOA: 08/09/2018  Referring physician: Dr Carl Murphy PCP: Carl Noble, MD   Chief Complaint: Gen weakness, low blood count on labs from cancer center.   HPI: Carl Murphy is a 72 y.o. male w/ hx of HTN, HOH, depression, COPD, bladder cancer w/ bilat PCN tubes placed within the past few months.  Pt here for severe hematuria/ passing large clots. Per family this really got worse in August and is getting worse again now. They have appt w/ Dr Carl Murphy and are hopeful that more tumor can "be scraped out of his bladder".   They note that pt's oncologist here in [place] just started a new chemo agent and he got one dose recently and maybe the lung tumors shrunk a bit,  but then he became "too weak" to continue the drug.  Patient's main c/o is feeling tired, no sig SOB/ cough or CP, no abd pain.  Hurts to pass the large clots.  He states he would want CPR/ shock if arrests but no intubation, which family confirms are his wishes as prior.  He doesn't wish to discuss hospice possibility but gives his OK for me to discuss w/ his family/ 3 children who are here.    Family are hopeful that he can get more help from "scraping the bladder" but some also acknowledge his is doing worse in the last 4-5 weeks then he ever has been , particulary severe fatigue and severe bleeding per the penis. Pt has appt w urology tomorrow at 3:45 pm in [place], they would very much like to make that appt.      Recent admits:  05/2017 > ABL anemia/ hematuria/ bladder Ca, rx'd CBI, 5u RBC's, went to OR per urology for TURBT and clot evac, ASA dc'd, AKI/ CKD3, creat 2.1, COPD/ HTN  07/2018 > weak/ voiding clots, Hb 5, falls, neph tubes x 2 mos , ABL anemia , CKDIV. Pt left AMA.      ROS  denies CP  no joint pain   no HA  no blurry vision  no rash  no diarrhea  no nausea/ vomiting  no dysuria  no difficulty voiding  no change in urine  color    Past Medical History  Past Medical History:  Diagnosis Date  . Arthritis   . Cancer [facility])    Bladder Cancer  . CHF (congestive heart failure) ([place])   . COPD (chronic obstructive pulmonary disease) ([place])   . Depression   . Hard of hearing    bilat   . Hypertension   . Malignant neoplasm of trigone of bladder ([place]) 03/25/2016  . PONV (postoperative nausea and vomiting)   . Shortness of breath dyspnea    heat; walking up set of stairs; pt states can walk up 12-14 stairs slowly without having to stop to catch his breath   Past Surgical History  Past Surgical History:  Procedure Laterality Date  . CYSTOSCOPY N/A 06/16/2016   Procedure: CYSTOSCOPY;  Surgeon: Franchot Gallo, MD;  Location: [facility]S;  Service: Urology;  Laterality: N/A;  . IR FLUORO GUIDE PORT INSERTION RIGHT  06/18/2017  . IR NEPHROSTOMY EXCHANGE LEFT  05/20/2018  . IR NEPHROSTOMY EXCHANGE LEFT  08/03/2018  . IR NEPHROSTOMY EXCHANGE RIGHT  05/20/2018  . IR NEPHROSTOMY EXCHANGE RIGHT  08/03/2018  . IR NEPHROSTOMY PLACEMENT LEFT  03/25/2018  . IR NEPHROSTOMY PLACEMENT RIGHT  03/25/2018  . IR US GUIDE VASC ACCESS RIGHT  06/18/2017  . TRANSURETHRAL RESECTION OF BLADDER TUMOR N/A 03/25/2016   Procedure: TRANSURETHRAL RESECTION OF BLADDER TUMOR (TURBT);  Surgeon: Franchot Gallo, MD;  Location: [facility]S;  Service: Urology;  Laterality: N/A;  . TRANSURETHRAL RESECTION OF BLADDER TUMOR N/A 06/16/2016   Procedure: TRANSURETHRAL RESECTION OF BLADDER TUMOR (TURBT);  Surgeon: Franchot Gallo, MD;  Location: [facility];  Service: Urology;  Laterality: N/A;  . TRANSURETHRAL RESECTION OF BLADDER TUMOR N/A 12/16/2016   Procedure: TRANSURETHRAL RESECTION OF BLADDER TUMOR (TURBT);  Surgeon: Franchot Gallo, MD;  Location: [facility];  Service: Urology;  Laterality: N/A;  . TRANSURETHRAL RESECTION OF BLADDER TUMOR N/A 05/11/2017   Procedure: TRANSURETHRAL RESECTION OF BLADDER TUMOR (TURBT) CLOT EVACUATION;  Surgeon: Franchot Gallo, MD;   Location: [facility];  Service: Urology;  Laterality: N/A;  [place] BLUE MEDICARE-[id number]  . TRANSURETHRAL RESECTION OF BLADDER TUMOR N/A 04/01/2018   Procedure: TRANSURETHRAL RESECTION OF BLADDER TUMOR (TURBT);  Surgeon: Franchot Gallo, MD;  Location: [facility];  Service: Urology;  Laterality: N/A;  1 HR [phone] BLUE MEDICARE-[id number]   Family History  Family History  Problem Relation Age of Onset  . Hypertension Mother   . Asthma Father   . Emphysema Father    Social History  reports that he has been smoking cigarettes. He has a 100.00 pack-year smoking history. He has never used smokeless tobacco. He reports that he drank alcohol. He reports that he does not use drugs. Allergies  Allergies  Allergen Reactions  . Carboplatin Shortness Of Breath    Reaction occurred after patient left Cancer Center. Pt reported extreme sob after leaving [facility] that eventually resolved on its own.   Home medications Prior to Admission medications   Medication Sig Start Date End Date Taking? Authorizing Provider  acetaminophen (TYLENOL) 500 MG tablet Take 1,000 mg by mouth every 6 (six) hours as needed for mild pain or moderate pain (PRN for pain).    Yes [provider]  amLODipine (NORVASC) 5 MG tablet Take 5 mg by mouth daily.  04/09/18  Yes [provider]  belladonna-opium (B&O SUPPRETTES) 16.2-30 MG suppository Place 1 suppository (30 mg total) rectally every 8 (eight) hours as needed for pain. 07/29/18  Yes Lockamy, Randi L, NP-C  belladonna-opium (B&O SUPPRETTES) 16.2-30 MG suppository Place 1 suppository rectally every 12 hours as needed for pain 07/30/18  Yes Derek Jack, MD  carvedilol (COREG) 25 MG tablet Take 25 mg by mouth 2 (two) times daily.  01/31/16  Yes [provider]  cetirizine (ZYRTEC) 10 MG tablet Take 10 mg by mouth daily.   Yes [provider]  citalopram (CELEXA) 20 MG tablet Take 20 mg by mouth daily.  01/31/16  Yes  [provider]  ENSURE (ENSURE) Take 237 mLs by mouth once a week.   Yes [provider]  fluticasone (FLONASE) 50 MCG/ACT nasal spray Place 2 sprays into both nostrils daily as needed for allergies or rhinitis.   Yes [provider]  ipratropium-albuterol (DUONEB) 0.5-2.5 (3) MG/3ML SOLN Take 3 mLs by nebulization every 6 (six) hours as needed. 06/18/18  Yes Derek Jack, MD  lidocaine (XYLOCAINE) 2 % solution Use as directed 15 mLs in the mouth or throat every 6 (six) hours as needed for mouth pain.  07/07/18  Yes [provider]  sucralfate (CARAFATE) 1 GM/10ML suspension Carafate 1gm/69m and Viscous lidocaine 2% 1:1 mixture. Swish and swallow 1 tablespoon four times a day Patient taking differently: Take by mouth See admin instructions. Carafate 1gm/136m  and Viscous lidocaine 2% 1:1 mixture. Swish and swallow 1 tablespoon four times a day 07/02/18  Yes Derek Jack, MD  VENTOLIN HFA 108 [phone] Base) MCG/ACT inhaler Inhale 1-2 puffs into the lungs every 4 (four) hours as needed for wheezing or shortness of breath.  04/27/18  Yes [provider]  oxybutynin (DITROPAN) 5 MG tablet Take 1 tablet (5 mg total) by mouth every 6 (six) hours as needed for bladder spasms. Patient not taking: Reported on 07/24/2018 05/12/17   Cleon Gustin, MD   Liver Function Tests Recent Labs  Lab 08/09/18 1438  AST 20  ALT 9  ALKPHOS 54  BILITOT 0.5  PROT 5.7*  ALBUMIN 2.4*   No results for input(s): LIPASE, AMYLASE in the last 168 hours. CBC Recent Labs  Lab 08/09/18 1220 08/09/18 1438  WBC 4.5 4.7  NEUTROABS 3.6 3.8  HGB 4.3* 3.9*  HCT 13.2* 12.2*  MCV 96.4 96.1  PLT 182 027   Basic Metabolic Panel Recent Labs  Lab 08/09/18 1438  NA 136  K 4.8  CL 112*  CO2 17*  GLUCOSE 104*  BUN 44*  CREATININE 5.11*  CALCIUM 7.0*     Vitals:   08/09/18 1515 08/09/18 1530 08/09/18 1600 08/09/18 1644  BP: 92/61 (!) 98/49 (!) 100/50 (!) 84/42   Pulse: (!) 50 (!) 50 (!) 49 (!) 46  Resp: 16 18 12 16   Temp:    (!) 97.4 F (36.3 C)  TempSrc:      SpO2: 99% 100% 98%   Weight:      Height:       Exam: Gen alert, very pale, tired but not in distress No rash, cyanosis or gangrene Sclera anicteric, throat clear  No jvd or bruits Chest clear bilat, no wheezing, gen decreased bilat BS RRR no MRG Abd soft ntnd no mass or ascites +bs GU normal male, bilat PCN tubes w/ bloody urine R> L  MS no joint effusions or deformity Ext  edema / no wounds or ulcers Neuro is alert, Ox 3 , nf, gen weakness     Home meds:  - amlodipine 5/ carvedilol 25 bid  - citalopram 20 qd/ prn belladonna-opium  - ipratropium-albuterol 0.5-2.5 every 6h prn/ ventolin hfa prn  - sucralfate swish/ swallow qid  - oxybutynin 54m qid prn/ prn fluticasone  - ensure once per week   Na 136  BUN 44  Cr 5.11 eGFR 10- 12  Alb 2.4  LFT's ok  CO2 17  WBC 4.7  HB 3.9   (6.7 on 8/29)   plt 167    CXR (independ reviewed) > IMPRESSION: 1. Increased size and conspicuity of right-sided lung masses as compared to prior radiograph. Stable left upper lobe lung mass. 2. Hyperinflation with emphysematous disease. 3. No acute pulmonary infiltrate.   Assessment: 1. Anemia due to gross hematuria/ ABL - main issue is prob recurrent bladder tumor, sp multiple TURBT's per urology.  Has appt tomorrow w/ urology in the afternoon.  Will give 4 u prbc's overnight slowly w/ hx CHF, but he has no extra vol on exam so should be ok.   2. Bladder Ca - w/ lung mets, stage IV now since mid 2018, was dx'd in 2017.  [place]C in [place]  3. Renal failure - baseline creat worsening since Jan 2019 from mid 2's gradually up to 4- 5 the last 2 months.  This may be a major issue going forward.  Some of his fatigue may be uremia  but hard to tell w/ severe anemia.  Is not nauseous.  Repeat creat am. Would not be HD candidate.  Family declined opportunity to meet w/ palliative care while here.  As  above pt deferring to family at this time regarding discussing EOL issues.   4. HTN - BP's soft, will hold BP meds 5. COPD - cont O2 while here, no wheezing 6. Partial DNR - no intubation at pt request   Plan - as above       [place] D [facility] Pager [phone]   If 7PM-7AM, please contact night-coverage [email or web address] Password [facility] 08/09/2018, 4:47 PM

## 2018-08-09 NOTE — Progress Notes (Signed)
CRITICAL VALUE ALERT  Critical value received:  Hemoglobin 4.3 Date of notification:  08-09-2018 Time of notification: 1330  Critical value read back:  Yes.   Nurse who received alert:  C. PageRN MD notified (1st Carl Murphy):  Dr. Walden Field  1335-tried to call multiple numbers to get patient and family members. Left messages.   1340 ex-wife called back and I spoke with her to tell her about his hemoglobin. She stated she would get in touch with the daughter Carl Murphy-which is the main caregiver.   [place] called me, informed her of the situation, told her he needed to go to the emergency room per MD or if he refused we would given him two units of blood , one today and one tomorrow per Dr. Walden Field.   1415-Carl Murphy contacted me and told me they decided to go to the emergency room. Dr. Walden Field informed.

## 2018-08-09 NOTE — ED Triage Notes (Signed)
Pt brought to ED for abnormal labs. Pt had labs drawn in cancer center and hemoglobin 4.3. Pt was told to come to ED. Pt c/o generalized weakness. Pt was hospitalized 2 weeks ago and received a blood transfusion then. Pt last chemo treatment 5-6 weeks ago.

## 2018-08-09 NOTE — ED Provider Notes (Signed)
[facility] EMERGENCY DEPARTMENT Provider Note   CSN: [id number] Arrival date & time: 08/09/18  1403     History   Chief Complaint Chief Complaint  Patient presents with  . Abnormal Lab    HPI Carl Murphy is a 72 y.o. male.  HPI Presents with 2 family members to assist with the HPI. Patient presents due to worsening weakness, fatigue, lightheadedness, and episodic confusion. Patient has multiple medical issues, but most pertinently, patient has bladder cancer, has bilateral nephrostomy tubes, has ongoing bleeding, and typically receives transfusions about every 7 to 10 days. Last transfusion was little more than 1 week ago. He notes that since that time he has had evaluation of nephrostomy tubes, with replacement of both. However, he continues to have frank blood produced into his urine collection bag, and progression of his weakness. He denies focal pain describes generalized discomfort, as well as weakness. He is unaware of confusion, but his daughter states that he has had episodes of brief confusion during this illness.  Past Medical History:  Diagnosis Date  . Arthritis   . Cancer [facility])    Bladder Cancer  . CHF (congestive heart failure) ([place])   . COPD (chronic obstructive pulmonary disease) ([place])   . Depression   . Hard of hearing    bilat   . Hypertension   . Malignant neoplasm of trigone of bladder ([place]) 03/25/2016  . PONV (postoperative nausea and vomiting)   . Shortness of breath dyspnea    heat; walking up set of stairs; pt states can walk up 12-14 stairs slowly without having to stop to catch his breath    Patient Active Problem List   Diagnosis Date Noted  . Hypotension due to blood loss 07/25/2018  . Bilateral hydronephrosis 07/25/2018  . Malfunction of nephrostomy tube ([place]) 07/25/2018  . Acidosis 07/25/2018  . Acute renal failure superimposed on stage 4 chronic kidney disease ([place]) 07/25/2018  . Anemia due to acute blood loss   . Nephrostomy  tube displaced ([place])   . Ureteral obstruction   . Hydronephrosis   . Hematuria 07/24/2018  . CKD (chronic kidney disease) 05/19/2018  . Productive cough 07/24/2017  . Mucositis oral 07/21/2017  . Acute blood loss anemia 05/07/2017  . Gross hematuria 05/07/2017  . AKI (acute kidney injury) ([place]) 05/07/2017  . COPD (chronic obstructive pulmonary disease) ([place]) 05/07/2017  . Tobacco dependence 05/07/2017  . Tobacco abuse 05/15/2016  . Malignant neoplasm of trigone of bladder ([place]) 03/25/2016    Past Surgical History:  Procedure Laterality Date  . CYSTOSCOPY N/A 06/16/2016   Procedure: CYSTOSCOPY;  Surgeon: Franchot Gallo, MD;  Location: [facility];  Service: Urology;  Laterality: N/A;  . IR FLUORO GUIDE PORT INSERTION RIGHT  06/18/2017  . IR NEPHROSTOMY EXCHANGE LEFT  05/20/2018  . IR NEPHROSTOMY EXCHANGE LEFT  08/03/2018  . IR NEPHROSTOMY EXCHANGE RIGHT  05/20/2018  . IR NEPHROSTOMY EXCHANGE RIGHT  08/03/2018  . IR NEPHROSTOMY PLACEMENT LEFT  03/25/2018  . IR NEPHROSTOMY PLACEMENT RIGHT  03/25/2018  . IR US GUIDE VASC ACCESS RIGHT  06/18/2017  . TRANSURETHRAL RESECTION OF BLADDER TUMOR N/A 03/25/2016   Procedure: TRANSURETHRAL RESECTION OF BLADDER TUMOR (TURBT);  Surgeon: Franchot Gallo, MD;  Location: [facility];  Service: Urology;  Laterality: N/A;  . TRANSURETHRAL RESECTION OF BLADDER TUMOR N/A 06/16/2016   Procedure: TRANSURETHRAL RESECTION OF BLADDER TUMOR (TURBT);  Surgeon: Franchot Gallo, MD;  Location: [facility];  Service: Urology;  Laterality: N/A;  . TRANSURETHRAL RESECTION OF BLADDER  TUMOR N/A 12/16/2016   Procedure: TRANSURETHRAL RESECTION OF BLADDER TUMOR (TURBT);  Surgeon: Franchot Gallo, MD;  Location: [facility];  Service: Urology;  Laterality: N/A;  . TRANSURETHRAL RESECTION OF BLADDER TUMOR N/A 05/11/2017   Procedure: TRANSURETHRAL RESECTION OF BLADDER TUMOR (TURBT) CLOT EVACUATION;  Surgeon: Franchot Gallo, MD;  Location: [facility];  Service: Urology;  Laterality: N/A;  [place] BLUE MEDICARE-[id number]  . TRANSURETHRAL RESECTION OF BLADDER TUMOR N/A 04/01/2018   Procedure: TRANSURETHRAL RESECTION OF BLADDER TUMOR (TURBT);  Surgeon: Franchot Gallo, MD;  Location: [facility];  Service: Urology;  Laterality: N/A;  1 HR [phone] BLUE MEDICARE-[id number]        Home Medications    Prior to Admission medications   Medication Sig Start Date End Date Taking? Authorizing Provider  acetaminophen (TYLENOL) 500 MG tablet Take 1,000 mg by mouth daily.     [provider]  amLODipine (NORVASC) 5 MG tablet Take 5 mg by mouth daily.  04/09/18   [provider]  belladonna-opium (B&O SUPPRETTES) 16.2-30 MG suppository Place 1 suppository (30 mg total) rectally every 8 (eight) hours as needed for pain. 07/29/18   Lockamy, Randi L, NP-C  belladonna-opium (B&O SUPPRETTES) 16.2-30 MG suppository Place 1 suppository rectally every 12 hours as needed for pain 07/30/18   Derek Jack, MD  carvedilol (COREG) 25 MG tablet Take 25 mg by mouth 2 (two) times daily.  01/31/16   [provider]  cetirizine (ZYRTEC) 10 MG tablet Take 10 mg by mouth daily.    [provider]  citalopram (CELEXA) 20 MG tablet Take 20 mg by mouth daily.  01/31/16   [provider]  ENSURE (ENSURE) Take 237 mLs by mouth once a week.    [provider]  fluticasone (FLONASE) 50 MCG/ACT nasal spray Place 2 sprays into both nostrils daily as needed for allergies or rhinitis.    [provider]  ipratropium-albuterol (DUONEB) 0.5-2.5 (3) MG/3ML SOLN Take 3 mLs by nebulization every 6 (six) hours as needed. 06/18/18   Derek Jack, MD  lidocaine (XYLOCAINE) 2 % solution Use as directed 15 mLs in the mouth or throat every 6 (six) hours as needed for mouth pain.  07/07/18   [provider]  oxybutynin (DITROPAN) 5 MG tablet Take 1 tablet (5 mg total) by mouth every 6 (six) hours as needed for bladder spasms. Patient  not taking: Reported on 07/24/2018 05/12/17   Cleon Gustin, MD  sucralfate (CARAFATE) 1 GM/10ML suspension Carafate 1gm/67ml and Viscous lidocaine 2% 1:1 mixture. Swish and swallow 1 tablespoon four times a day Patient not taking: Reported on 07/24/2018 07/02/18   Derek Jack, MD  VENTOLIN HFA 108 (90 Base) MCG/ACT inhaler Inhale 1-2 puffs into the lungs every 4 (four) hours as needed for wheezing or shortness of breath.  04/27/18   [provider]    Family History Family History  Problem Relation Age of Onset  . Hypertension Mother   . Asthma Father   . Emphysema Father     Social History Social History   Tobacco Use  . Smoking status: Current Every Day Smoker    Packs/day: 2.00    Years: 50.00    Pack years: 100.00    Types: Cigarettes  . Smokeless tobacco: Never Used  Substance Use Topics  . Alcohol use: Not Currently    Comment: occasionally  . Drug use: No     Allergies   Carboplatin   Review of Systems Review of Systems  Constitutional:       Per HPI, otherwise negative  HENT:       Per HPI, otherwise negative  Respiratory:       Per HPI, otherwise negative  Cardiovascular:       Per HPI, otherwise negative  Gastrointestinal: Positive for nausea. Negative for vomiting.  Endocrine:       Negative aside from HPI  Genitourinary:       Neg aside from HPI   Musculoskeletal:       Per HPI, otherwise negative  Skin: Negative.   Allergic/Immunologic: Positive for immunocompromised state.  Neurological: Positive for weakness. Negative for syncope.     Physical Exam Updated Vital Signs BP (!) 89/49 (BP Location: Left Arm)   Pulse (!) 51   Temp (!) 97.4 F (36.3 C) (Tympanic)   Resp 15   Ht 5\' 7"  (1.702 m)   Wt 74.8 kg   SpO2 95%   BMI 25.84 kg/m   Physical Exam  Constitutional: He is oriented to person, place, and time. He has a sickly appearance.  HENT:  Head: Normocephalic and atraumatic.  Eyes: Conjunctivae and EOM are  normal.  Cardiovascular: Normal rate and regular rhythm.  Pulmonary/Chest: Effort normal. No stridor. No respiratory distress.  Abdominal: He exhibits no distension.    Neurological: He is alert and oriented to person, place, and time.  Skin: Skin is warm and dry. There is pallor.  Psychiatric: He has a normal mood and affect.  Nursing note and vitals reviewed.    ED Treatments / Results  Labs (all labs ordered are listed, but only abnormal results are displayed) Labs Reviewed  COMPREHENSIVE METABOLIC PANEL - Abnormal; Notable for the following components:      Result Value   Chloride 112 (*)    CO2 17 (*)    Glucose, Bld 104 (*)    BUN 44 (*)    Creatinine, Ser 5.11 (*)    Calcium 7.0 (*)    Total Protein 5.7 (*)    Albumin 2.4 (*)    GFR calc non Af Amer 10 (*)    GFR calc Af Amer 12 (*)    All other components within normal limits  CBC WITH DIFFERENTIAL/PLATELET - Abnormal; Notable for the following components:   RBC 1.27 (*)    Hemoglobin 3.9 (*)    HCT 12.2 (*)    RDW 20.7 (*)    Lymphs Abs 0.6 (*)    All other components within normal limits  CULTURE, BLOOD (ROUTINE X 2)  CULTURE, BLOOD (ROUTINE X 2)  PROTIME-INR  URINALYSIS, ROUTINE W REFLEX MICROSCOPIC  LACTIC ACID, PLASMA  LACTIC ACID, PLASMA  TYPE AND SCREEN  PREPARE RBC (CROSSMATCH)    Radiology Dg Chest 2 View  Result Date: 08/09/2018 CLINICAL DATA:  Possible sepsis, short of breath and weak EXAM: CHEST - 2 VIEW COMPARISON:  07/24/2018, CT chest 06/07/2018 FINDINGS: Right-sided central venous port tip over the proximal right atrium. 3 cm left upper lobe lung mass. Increased size of right perihilar mass, measuring 3.1 cm. Increased conspicuity right upper lobe lung mass. No pleural effusion. Hyperinflation with emphysematous disease. Normal heart size with aortic atherosclerosis. No pneumothorax. IMPRESSION: 1. Increased size and conspicuity of right-sided lung masses as compared to prior radiograph. Stable  left upper lobe lung mass. 2. Hyperinflation with emphysematous disease. 3. No acute pulmonary infiltrate. Electronically Signed   By: Donavan Foil M.D.   On: 08/09/2018 14:55    Procedures Procedures (including critical care  time)  Medications Ordered in ED Medications  0.9 %  sodium chloride infusion (has no administration in time range)  fentaNYL (SUBLIMAZE) injection 25 mcg (has no administration in time range)  nicotine (NICODERM CQ - dosed in mg/24 hours) patch 14 mg (has no administration in time range)     Initial Impression / Assessment and Plan / ED Course  I have reviewed the triage vital signs and the nursing notes.  Pertinent labs & imaging results that were available during my care of the patient were reviewed by me and considered in my medical decision making (see chart for details).    After the initial evaluation with concern for hypotension, ongoing bleeding, the patient had a type and screen, and is preparing for transfusion of 2 units of blood. Chart review notable for similar evaluation several weeks ago    On repeat exam patient in similar condition. Labs notable for likely abnormal hemoglobin value, 3.9. Patient has had anemia in the past, though not as substantial. Given the patient's weakness, abnormal hemoglobin value, he will require admission for further evaluation. Patient has had increase in his blood pressure.  This elderly male with bladder cancer, bilateral nephrostomy tubes, recurrent anemia presents with weakness, was found to have critically low hemoglobin value, requiring admission after initiation of transfusion.    Final Clinical Impressions(s) / ED Diagnoses  Symptomatic anemia   CRITICAL CARE Performed by: Carmin Muskrat Total critical care time: 35 minutes Critical care time was exclusive of separately billable procedures and treating other patients. Critical care was necessary to treat or prevent imminent or life-threatening  deterioration. Critical care was time spent personally by me on the following activities: development of treatment plan with patient and/or surrogate as well as nursing, discussions with consultants, evaluation of patient's response to treatment, examination of patient, obtaining history from patient or surrogate, ordering and performing treatments and interventions, ordering and review of laboratory studies, ordering and review of radiographic studies, pulse oximetry and re-evaluation of patient's condition.    Carmin Muskrat, MD 08/09/18 [phone]

## 2018-08-10 ENCOUNTER — Ambulatory Visit: Payer: Medicare Other | Admitting: Urology

## 2018-08-10 ENCOUNTER — Observation Stay (HOSPITAL_COMMUNITY): Payer: Medicare Other

## 2018-08-10 ENCOUNTER — Inpatient Hospital Stay (HOSPITAL_COMMUNITY): Payer: Medicare Other

## 2018-08-10 ENCOUNTER — Other Ambulatory Visit: Payer: Self-pay

## 2018-08-10 DIAGNOSIS — R7881 Bacteremia: Secondary | ICD-10-CM | POA: Diagnosis not present

## 2018-08-10 DIAGNOSIS — E440 Moderate protein-calorie malnutrition: Secondary | ICD-10-CM | POA: Diagnosis present

## 2018-08-10 DIAGNOSIS — N1330 Unspecified hydronephrosis: Secondary | ICD-10-CM

## 2018-08-10 DIAGNOSIS — C670 Malignant neoplasm of trigone of bladder: Secondary | ICD-10-CM

## 2018-08-10 DIAGNOSIS — B952 Enterococcus as the cause of diseases classified elsewhere: Secondary | ICD-10-CM | POA: Diagnosis present

## 2018-08-10 DIAGNOSIS — J410 Simple chronic bronchitis: Secondary | ICD-10-CM | POA: Diagnosis not present

## 2018-08-10 DIAGNOSIS — N179 Acute kidney failure, unspecified: Secondary | ICD-10-CM

## 2018-08-10 DIAGNOSIS — D649 Anemia, unspecified: Secondary | ICD-10-CM | POA: Diagnosis not present

## 2018-08-10 DIAGNOSIS — D62 Acute posthemorrhagic anemia: Secondary | ICD-10-CM | POA: Diagnosis not present

## 2018-08-10 LAB — URINALYSIS, ROUTINE W REFLEX MICROSCOPIC
Bilirubin Urine: NEGATIVE
Glucose, UA: NEGATIVE mg/dL
Ketones, ur: NEGATIVE mg/dL
Nitrite: NEGATIVE
PROTEIN: 100 mg/dL — AB
RBC / HPF: >50 RBC/hpf — ABNORMAL HIGH (ref 0–5)
SPECIFIC GRAVITY, URINE: 1.011 (ref 1.005–1.030)
pH: 7 (ref 5.0–8.0)

## 2018-08-10 LAB — URINALYSIS, MICROSCOPIC (REFLEX)
RBC / HPF: >50 RBC/hpf (ref 0–5)
Squamous Epithelial / LPF: NONE SEEN (ref 0–5)

## 2018-08-10 LAB — BASIC METABOLIC PANEL
Anion gap: 6 (ref 5–15)
BUN: 39 mg/dL — AB (ref 8–23)
CALCIUM: 6.7 mg/dL — AB (ref 8.9–10.3)
CHLORIDE: 112 mmol/L — AB (ref 98–111)
CO2: 18 mmol/L — ABNORMAL LOW (ref 22–32)
CREATININE: 4.91 mg/dL — AB (ref 0.61–1.24)
GFR, EST AFRICAN AMERICAN: 13 mL/min — AB (ref >60)
GFR, EST NON AFRICAN AMERICAN: 11 mL/min — AB (ref >60)
Glucose, Bld: 81 mg/dL (ref 70–99)
POTASSIUM: 4.8 mmol/L (ref 3.5–5.1)
Sodium: 136 mmol/L (ref 135–145)

## 2018-08-10 LAB — CBC
HEMATOCRIT: 28.3 % — AB (ref 39.0–52.0)
HEMOGLOBIN: 9.5 g/dL — AB (ref 13.0–17.0)
MCH: 30.6 pg (ref 26.0–34.0)
MCHC: 33.6 g/dL (ref 30.0–36.0)
MCV: 91.3 fL (ref 78.0–100.0)
Platelets: 116 10*3/uL — ABNORMAL LOW (ref 150–400)
RBC: 3.1 MIL/uL — AB (ref 4.22–5.81)
RDW: 16.4 % — ABNORMAL HIGH (ref 11.5–15.5)
WBC: 4.2 10*3/uL (ref 4.0–10.5)

## 2018-08-10 LAB — BLOOD CULTURE ID PANEL (REFLEXED)
Acinetobacter baumannii: NOT DETECTED
CANDIDA TROPICALIS: NOT DETECTED
Candida albicans: NOT DETECTED
Candida glabrata: NOT DETECTED
Candida krusei: NOT DETECTED
Candida parapsilosis: NOT DETECTED
ENTEROBACTER CLOACAE COMPLEX: NOT DETECTED
ENTEROCOCCUS SPECIES: DETECTED — AB
Enterobacteriaceae species: NOT DETECTED
Escherichia coli: NOT DETECTED
HAEMOPHILUS INFLUENZAE: NOT DETECTED
KLEBSIELLA PNEUMONIAE: NOT DETECTED
Klebsiella oxytoca: NOT DETECTED
LISTERIA MONOCYTOGENES: NOT DETECTED
NEISSERIA MENINGITIDIS: NOT DETECTED
PROTEUS SPECIES: NOT DETECTED
Pseudomonas aeruginosa: NOT DETECTED
STAPHYLOCOCCUS AUREUS BCID: NOT DETECTED
STREPTOCOCCUS AGALACTIAE: NOT DETECTED
STREPTOCOCCUS SPECIES: NOT DETECTED
Serratia marcescens: NOT DETECTED
Staphylococcus species: NOT DETECTED
Streptococcus pneumoniae: NOT DETECTED
Streptococcus pyogenes: NOT DETECTED
VANCOMYCIN RESISTANCE: NOT DETECTED

## 2018-08-10 LAB — ECHOCARDIOGRAM COMPLETE
Height: 68 in
Weight: 2550.28 oz

## 2018-08-10 MED ORDER — NICOTINE 14 MG/24HR TD PT24
14.0000 mg | MEDICATED_PATCH | Freq: Every day | TRANSDERMAL | Status: DC
  Administered 2018-08-10 – 2018-08-15 (×4): 14 mg via TRANSDERMAL
  Filled 2018-08-10 (×4): qty 1

## 2018-08-10 MED ORDER — SODIUM CHLORIDE 0.9 % IV SOLN
INTRAVENOUS | Status: DC
  Administered 2018-08-10 – 2018-08-12 (×3): via INTRAVENOUS
  Administered 2018-08-13: 1000 mL via INTRAVENOUS
  Administered 2018-08-13: 02:00:00 via INTRAVENOUS

## 2018-08-10 MED ORDER — CHLORHEXIDINE GLUCONATE CLOTH 2 % EX PADS
6.0000 | MEDICATED_PAD | Freq: Every day | CUTANEOUS | Status: DC
  Administered 2018-08-10 – 2018-08-11 (×2): 6 via TOPICAL

## 2018-08-10 MED ORDER — ACETAMINOPHEN 500 MG PO TABS
1000.0000 mg | ORAL_TABLET | Freq: Four times a day (QID) | ORAL | Status: DC | PRN
  Administered 2018-08-16: 1000 mg via ORAL
  Filled 2018-08-10 (×3): qty 2

## 2018-08-10 MED ORDER — SODIUM CHLORIDE 0.9% FLUSH
10.0000 mL | INTRAVENOUS | Status: DC | PRN
  Administered 2018-08-14: 10 mL
  Filled 2018-08-10: qty 40

## 2018-08-10 MED ORDER — SODIUM CHLORIDE 0.9 % IV SOLN
2.0000 g | Freq: Four times a day (QID) | INTRAVENOUS | Status: DC
  Filled 2018-08-10 (×6): qty 2000

## 2018-08-10 MED ORDER — SODIUM CHLORIDE 0.9 % IV SOLN
2.0000 g | Freq: Three times a day (TID) | INTRAVENOUS | Status: DC
  Administered 2018-08-10 – 2018-08-12 (×7): 2 g via INTRAVENOUS
  Filled 2018-08-10 (×10): qty 2000

## 2018-08-10 MED ORDER — LIDOCAINE HCL URETHRAL/MUCOSAL 2 % EX GEL
1.0000 "application " | Freq: Once | CUTANEOUS | Status: AC
  Administered 2018-08-10: 1 via URETHRAL
  Filled 2018-08-10: qty 30

## 2018-08-10 MED ORDER — SODIUM CHLORIDE 0.9% FLUSH
10.0000 mL | Freq: Two times a day (BID) | INTRAVENOUS | Status: DC
  Administered 2018-08-10 – 2018-08-17 (×12): 10 mL

## 2018-08-10 MED ORDER — IPRATROPIUM-ALBUTEROL 0.5-2.5 (3) MG/3ML IN SOLN
3.0000 mL | Freq: Three times a day (TID) | RESPIRATORY_TRACT | Status: DC
  Administered 2018-08-10 – 2018-08-15 (×13): 3 mL via RESPIRATORY_TRACT
  Filled 2018-08-10 (×19): qty 3

## 2018-08-10 NOTE — Progress Notes (Signed)
PROGRESS NOTE    Carl Murphy  WCB:[id number] DOB: Nov 30, 1946 DOA: 08/09/2018 PCP: [name], MD     Brief Narrative:  72 year old man admitted from home on 9/9 due to hematuria, fatigue and weakness.  He has a history of bladder cancer with bilateral nephrostomy tubes placed in June of this year.  He was just recently started on chemotherapy and got a dose recently but became too weak to continue the drug.  Upon arrival he was found to have a hemoglobin of 3.7 and admission was requested.  Subsequently on 9/10 blood cultures have resulted positive for enterococcus.   Assessment & Plan:   Principal Problem:   Acute blood loss anemia Active Problems:   Malignant neoplasm of trigone of bladder (HCC)   Gross hematuria   COPD (chronic obstructive pulmonary disease) (HCC)   Tobacco dependence   Bilateral hydronephrosis   Acute renal failure superimposed on stage 4 chronic kidney disease (HCC)   Nephrostomy status (HCC), bilateral   Acute blood loss anemia -Secondary to bladder cancer with hematuria.   -He is status post 4 units of PRBCs overnight with hemoglobin this morning of 9.5. -Plan to treat with continued transfusions if needed for hemoglobin less than 7. -We will request inpatient urology consultation for further recommendations regards to his bladder cancer.  Patient's family is very hopeful that there might be other treatment options for him.  Enterococcal bacteremia/septicemia -Blood cultures were drawn on admission.  BC ID is positive for enterococcus. -Have discussed case via phone with ID, Dr. Drucilla Schmidt, he recommends ampicillin and 2D echo for further work-up.   -Suspect source to be urinary, especially given his bilateral nephrostomy tubes.  Hypertension -Blood pressure is soft but stable, all oral antihypertensives are on hold.  COPD -Compensated, at baseline  Acute on chronic kidney disease stage IV-V -Baseline creatinine appears to be around 4-4.8, creatinine  was 5.1 and admission. -Suspect a combination of ATN from severe anemia plus obstructive issues with bladder cancer. -Continue IV fluids, suspect he would be a poor dialysis candidate, nonetheless we will request nephrology consultation given his advanced CKD.   DVT prophylaxis: SCDs Code Status: Limited code Family Communication: Wife, daughter and son-in-law at bedside updated on plan of care and all questions answered Disposition Plan: Pending medical stability; keep in stepdown unit today.  Consultants:   Urology pending  ID consultation via phone  Procedures:   Echo, pending  Antimicrobials:  Anti-infectives (From admission, onward)   Start     Dose/Rate Route Frequency Ordered Stop   08/10/18 0830  ampicillin (OMNIPEN) 2 g in sodium chloride 0.9 % 100 mL IVPB     2 g 300 mL/hr over 20 Minutes Intravenous Every 6 hours 08/10/18 0819         Subjective: Lying in bed, very weak, denies chest pain or shortness of breath.  Objective: Vitals:   08/10/18 0434 08/10/18 0500 08/10/18 0723 08/10/18 0842  BP: 131/65 138/72    Pulse: (!) 52 (!) 49 (!) 49   Resp: 20 15 15    Temp: (!) 95.3 F (35.2 C)  (!) 94.5 F (34.7 C)   TempSrc: Rectal  Rectal   SpO2: 100% 100% 100% 100%  Weight: 72.3 kg     Height:        Intake/Output Summary (Last 24 hours) at 08/10/2018 0906 Last data filed at 08/10/2018 0450 Gross per 24 hour  Intake 3242.5 ml  Output 250 ml  Net 2992.5 ml   [employer]  08/09/18 1410 08/09/18 2246 08/10/18 0434  Weight: 74.8 kg 72.3 kg 72.3 kg    Examination:  General exam: Alert, awake, oriented x 3, pale Respiratory system: Clear to auscultation. Respiratory effort normal. Cardiovascular system:RRR. No murmurs, rubs, gallops. Gastrointestinal system: Abdomen is nondistended, soft and nontender. No organomegaly or masses felt. Normal bowel sounds heard. Central nervous system: Alert and oriented. No focal neurological deficits. Extremities: No  C/C/E, +pedal pulses Skin: No rashes, lesions or ulcers Psychiatry: Judgement and insight appear normal. Mood & affect appropriate.     Data Reviewed: I have personally reviewed following labs and imaging studies  CBC: Recent Labs  Lab 08/09/18 1220 08/09/18 1438 08/10/18 0716  WBC 4.5 4.7 4.2  NEUTROABS 3.6 3.8  --   HGB 4.3* 3.9* 9.5*  HCT 13.2* 12.2* 28.3*  MCV 96.4 96.1 91.3  PLT 182 167 696*   Basic Metabolic Panel: Recent Labs  Lab 08/09/18 1438 08/10/18 0716  NA 136 136  K 4.8 4.8  CL 112* 112*  CO2 17* 18*  GLUCOSE 104* 81  BUN 44* 39*  CREATININE 5.11* 4.91*  CALCIUM 7.0* 6.7*   GFR: Estimated Creatinine Clearance: 13.5 mL/min (A) (by C-G formula based on SCr of 4.91 mg/dL (H)). Liver Function Tests: Recent Labs  Lab 08/09/18 1438  AST 20  ALT 9  ALKPHOS 54  BILITOT 0.5  PROT 5.7*  ALBUMIN 2.4*   No results for input(s): LIPASE, AMYLASE in the last 168 hours. No results for input(s): AMMONIA in the last 168 hours. Coagulation Profile: Recent Labs  Lab 08/09/18 1438  INR 1.03   Cardiac Enzymes: No results for input(s): CKTOTAL, CKMB, CKMBINDEX, TROPONINI in the last 168 hours. BNP (last 3 results) No results for input(s): PROBNP in the last 8760 hours. HbA1C: No results for input(s): HGBA1C in the last 72 hours. CBG: No results for input(s): GLUCAP in the last 168 hours. Lipid Profile: No results for input(s): CHOL, HDL, LDLCALC, TRIG, CHOLHDL, LDLDIRECT in the last 72 hours. Thyroid Function Tests: No results for input(s): TSH, T4TOTAL, FREET4, T3FREE, THYROIDAB in the last 72 hours. Anemia Panel: No results for input(s): VITAMINB12, FOLATE, FERRITIN, TIBC, IRON, RETICCTPCT in the last 72 hours. Urine analysis:    Component Value Date/Time   COLORURINE RED (A) 08/10/2018 0120   APPEARANCEUR TURBID (A) 08/10/2018 0120   LABSPEC  08/10/2018 0120    TEST NOT REPORTED DUE TO COLOR INTERFERENCE OF URINE PIGMENT   PHURINE  08/10/2018  0120    TEST NOT REPORTED DUE TO COLOR INTERFERENCE OF URINE PIGMENT   GLUCOSEU (A) 08/10/2018 0120    TEST NOT REPORTED DUE TO COLOR INTERFERENCE OF URINE PIGMENT   HGBUR (A) 08/10/2018 0120    TEST NOT REPORTED DUE TO COLOR INTERFERENCE OF URINE PIGMENT   BILIRUBINUR (A) 08/10/2018 0120    TEST NOT REPORTED DUE TO COLOR INTERFERENCE OF URINE PIGMENT   KETONESUR (A) 08/10/2018 0120    TEST NOT REPORTED DUE TO COLOR INTERFERENCE OF URINE PIGMENT   PROTEINUR (A) 08/10/2018 0120    TEST NOT REPORTED DUE TO COLOR INTERFERENCE OF URINE PIGMENT   NITRITE (A) 08/10/2018 0120    TEST NOT REPORTED DUE TO COLOR INTERFERENCE OF URINE PIGMENT   LEUKOCYTESUR (A) 08/10/2018 0120    TEST NOT REPORTED DUE TO COLOR INTERFERENCE OF URINE PIGMENT   Sepsis Labs: @LABRCNTIP (procalcitonin:4,lacticidven:4)  ) Recent Results (from the past 240 hour(s))  Culture, blood (Routine x 2)     Status: None (Preliminary result)  Collection Time: 08/09/18  2:38 PM  Result Value Ref Range Status   Specimen Description BLOOD RIGHT ANTECUBITAL  Final   Special Requests   Final    BOTTLES DRAWN AEROBIC AND ANAEROBIC Blood Culture adequate volume   Culture  Setup Time   Final    GRAM POSITIVE COCCI IN CHAINS IN BOTH AEROBIC AND ANAEROBIC BOTTLES Gram Stain Report Called to,Read Back By and Verified With: HEARNE,J AT 0630 BY HUFFINES,S ON 08/10/18. Performed at [facility], [address] [place], [place] [address]    Culture PENDING  Incomplete   Report Status PENDING  Incomplete  Culture, blood (Routine x 2)     Status: None (Preliminary result)   Collection Time: 08/09/18  3:45 PM  Result Value Ref Range Status   Specimen Description   Final    BLOOD CENTRAL LINE Performed at [facility], [address] [place], [place] [address]    Special Requests   Final    BOTTLES DRAWN AEROBIC AND ANAEROBIC Blood Culture adequate volume Performed at [facility], [address] [place], [place] [address]     Culture  Setup Time   Final    GRAM POSITIVE COCCI Gram Stain Report Called to,Read Back By and Verified With: HEARN,J.  [id number] ON 08/10/2018 BY EVA IN BOTH AEROBIC AND ANAEROBIC BOTTLES Performed at [facility] ID to follow CRITICAL RESULT CALLED TO, READ BACK BY AND VERIFIED WITHJohny Chess RN 1224 08/10/18 A BROWNING Performed at [facility], [place] [address] [place], [place] [address]    Culture PENDING  Incomplete   Report Status PENDING  Incomplete  Blood Culture ID Panel (Reflexed)     Status: Abnormal   Collection Time: 08/09/18  3:45 PM  Result Value Ref Range Status   Enterococcus species DETECTED (A) NOT DETECTED Final    Comment: CRITICAL RESULT CALLED TO, READ BACK BY AND VERIFIED WITH: Johny Chess RN [phone] 08/10/18 A BROWNING    Vancomycin resistance NOT DETECTED NOT DETECTED Final   Listeria monocytogenes NOT DETECTED NOT DETECTED Final   Staphylococcus species NOT DETECTED NOT DETECTED Final   Staphylococcus aureus NOT DETECTED NOT DETECTED Final   Streptococcus species NOT DETECTED NOT DETECTED Final   Streptococcus agalactiae NOT DETECTED NOT DETECTED Final   Streptococcus pneumoniae NOT DETECTED NOT DETECTED Final   Streptococcus pyogenes NOT DETECTED NOT DETECTED Final   Acinetobacter baumannii NOT DETECTED NOT DETECTED Final   Enterobacteriaceae species NOT DETECTED NOT DETECTED Final   Enterobacter cloacae complex NOT DETECTED NOT DETECTED Final   Escherichia coli NOT DETECTED NOT DETECTED Final   Klebsiella oxytoca NOT DETECTED NOT DETECTED Final   Klebsiella pneumoniae NOT DETECTED NOT DETECTED Final   Proteus species NOT DETECTED NOT DETECTED Final   Serratia marcescens NOT DETECTED NOT DETECTED Final   Haemophilus influenzae NOT DETECTED NOT DETECTED Final   Neisseria meningitidis NOT DETECTED NOT DETECTED Final   Pseudomonas aeruginosa NOT DETECTED NOT DETECTED Final   Candida albicans NOT DETECTED NOT DETECTED Final   Candida glabrata  NOT DETECTED NOT DETECTED Final   Candida krusei NOT DETECTED NOT DETECTED Final   Candida parapsilosis NOT DETECTED NOT DETECTED Final   Candida tropicalis NOT DETECTED NOT DETECTED Final    Comment: Performed at [facility], [address], [place], [place] [address]         Radiology Studies: Dg Chest 2 View  Result Date: 08/09/2018 CLINICAL DATA:  Possible sepsis, short of breath and  weak EXAM: CHEST - 2 VIEW COMPARISON:  07/24/2018, CT chest 06/07/2018 FINDINGS: Right-sided central venous port tip over the proximal right atrium. 3 cm left upper lobe lung mass. Increased size of right perihilar mass, measuring 3.1 cm. Increased conspicuity right upper lobe lung mass. No pleural effusion. Hyperinflation with emphysematous disease. Normal heart size with aortic atherosclerosis. No pneumothorax. IMPRESSION: 1. Increased size and conspicuity of right-sided lung masses as compared to prior radiograph. Stable left upper lobe lung mass. 2. Hyperinflation with emphysematous disease. 3. No acute pulmonary infiltrate. Electronically Signed   By: Donavan Foil M.D.   On: 08/09/2018 14:55        Scheduled Meds: . sodium chloride   Intravenous Once  . Chlorhexidine Gluconate Cloth  6 each Topical Daily  . citalopram  20 mg Oral Daily  . feeding supplement (ENSURE ENLIVE)  237 mL Oral BID BM  . Influenza vac split quadrivalent PF  0.5 mL Intramuscular Tomorrow-1000  . ipratropium-albuterol  3 mL Nebulization TID  . nicotine  14 mg Transdermal Once  . sodium chloride flush  10-40 mL Intracatheter Q12H  . sodium chloride flush  3 mL Intravenous Q12H  . sucralfate  1 g Oral TID WC & HS   Continuous Infusions: . sodium chloride    . ampicillin (OMNIPEN) IV       LOS: 0 days    The patient is critically ill with multiple organ systems failure and requires high complexity decision making for assessment and support, frequent evaluation and titration of therapies, application of advanced  monitoring technologies and extensive interpretation of multiple databases.  Critical care time - 50 mins.        Lelon Frohlich, MD [facility] Pager [phone]  If 7PM-7AM, please contact night-coverage [email or web address] Password [email or web address] 08/10/2018, 9:06 AM

## 2018-08-10 NOTE — Consult Note (Signed)
Urology Consult  Consulting MD: Isaac Bliss  CC: Bladder cancer  HPI: This is a 72year old male with progressive, metastatic bladder cancer.  Unfortunately, he decided to late on curative therapy and since his diagnosis has had persistent, progressive bladder cancer.  He has pulmonary metastases and bilateral hydroureteronephrosis from local extension of his bladder cancer.  He now has bilateral percutaneous nephrostomy tubes.  He was admitted yesterday with significant anemia, hemoglobin approximately 3.5.  He has had 4 unit transfusion over the past 24 hours with most recent hemoglobin above 9.  He is transfusion dependent now because of persistent hematuria over the past few weeks/months.  He has had palliative TURBTs.  Urologic consultation is requested for assistance with management.  PMH: Past Medical History:  Diagnosis Date  . Arthritis   . Cancer [facility])    Bladder Cancer  . CHF (congestive heart failure) ([place])   . COPD (chronic obstructive pulmonary disease) ([place])   . Depression   . Hard of hearing    bilat   . Hypertension   . Malignant neoplasm of trigone of bladder ([place]) 03/25/2016  . PONV (postoperative nausea and vomiting)   . Shortness of breath dyspnea    heat; walking up set of stairs; pt states can walk up 12-14 stairs slowly without having to stop to catch his breath    PSH: Past Surgical History:  Procedure Laterality Date  . CYSTOSCOPY N/A 06/16/2016   Procedure: CYSTOSCOPY;  Surgeon: Franchot Gallo, MD;  Location: [facility];  Service: Urology;  Laterality: N/A;  . IR FLUORO GUIDE PORT INSERTION RIGHT  06/18/2017  . IR NEPHROSTOMY EXCHANGE LEFT  05/20/2018  . IR NEPHROSTOMY EXCHANGE LEFT  08/03/2018  . IR NEPHROSTOMY EXCHANGE RIGHT  05/20/2018  . IR NEPHROSTOMY EXCHANGE RIGHT  08/03/2018  . IR NEPHROSTOMY PLACEMENT LEFT  03/25/2018  . IR NEPHROSTOMY PLACEMENT RIGHT  03/25/2018  . IR US GUIDE VASC ACCESS RIGHT  06/18/2017  . TRANSURETHRAL RESECTION OF BLADDER  TUMOR N/A 03/25/2016   Procedure: TRANSURETHRAL RESECTION OF BLADDER TUMOR (TURBT);  Surgeon: Franchot Gallo, MD;  Location: [facility];  Service: Urology;  Laterality: N/A;  . TRANSURETHRAL RESECTION OF BLADDER TUMOR N/A 06/16/2016   Procedure: TRANSURETHRAL RESECTION OF BLADDER TUMOR (TURBT);  Surgeon: Franchot Gallo, MD;  Location: [facility];  Service: Urology;  Laterality: N/A;  . TRANSURETHRAL RESECTION OF BLADDER TUMOR N/A 12/16/2016   Procedure: TRANSURETHRAL RESECTION OF BLADDER TUMOR (TURBT);  Surgeon: Franchot Gallo, MD;  Location: [facility];  Service: Urology;  Laterality: N/A;  . TRANSURETHRAL RESECTION OF BLADDER TUMOR N/A 05/11/2017   Procedure: TRANSURETHRAL RESECTION OF BLADDER TUMOR (TURBT) CLOT EVACUATION;  Surgeon: Franchot Gallo, MD;  Location: [facility];  Service: Urology;  Laterality: N/A;  [place] BLUE MEDICARE-[id number]  . TRANSURETHRAL RESECTION OF BLADDER TUMOR N/A 04/01/2018   Procedure: TRANSURETHRAL RESECTION OF BLADDER TUMOR (TURBT);  Surgeon: Franchot Gallo, MD;  Location: [facility];  Service: Urology;  Laterality: N/A;  1 HR [id number] BLUE MEDICARE-[id number]    Allergies: Allergies  Allergen Reactions  . Carboplatin Shortness Of Breath    Reaction occurred after patient left Cancer Center. Pt reported extreme sob after leaving CC that eventually resolved on its own.    Medications: Medications Prior to Admission  Medication Sig Dispense Refill Last Dose  . acetaminophen (TYLENOL) 500 MG tablet Take 1,000 mg by mouth every 6 (six) hours as needed for mild pain or moderate pain (PRN for pain).    08/08/2018 at Unknown time  .  amLODipine (NORVASC) 5 MG tablet Take 5 mg by mouth daily.   11 08/08/2018 at Unknown time  . belladonna-opium (B&O SUPPRETTES) 16.2-30 MG suppository Place 1 suppository (30 mg total) rectally every 8 (eight) hours as needed for pain. 15 suppository 0 unknown  . belladonna-opium (B&O SUPPRETTES) 16.2-30 MG suppository  Place 1 suppository rectally every 12 hours as needed for pain 15 suppository 0 unknown  . carvedilol (COREG) 25 MG tablet Take 25 mg by mouth 2 (two) times daily.   11 08/09/2018 at 10-1199  . cetirizine (ZYRTEC) 10 MG tablet Take 10 mg by mouth daily.   08/09/2018 at 10-1199  . citalopram (CELEXA) 20 MG tablet Take 20 mg by mouth daily.   11 08/08/2018 at Unknown time  . ENSURE (ENSURE) Take 237 mLs by mouth once a week.   Past Week at Unknown time  . fluticasone (FLONASE) 50 MCG/ACT nasal spray Place 2 sprays into both nostrils daily as needed for allergies or rhinitis.   unknown  . ipratropium-albuterol (DUONEB) 0.5-2.5 (3) MG/3ML SOLN Take 3 mLs by nebulization every 6 (six) hours as needed. 360 mL 1 08/08/2018 at Unknown time  . lidocaine (XYLOCAINE) 2 % solution Use as directed 15 mLs in the mouth or throat every 6 (six) hours as needed for mouth pain.   0 08/09/2018 at Unknown time  . sucralfate (CARAFATE) 1 GM/10ML suspension Carafate 1gm/27ml and Viscous lidocaine 2% 1:1 mixture. Swish and swallow 1 tablespoon four times a day (Patient taking differently: Take by mouth See admin instructions. Carafate 1gm/66ml and Viscous lidocaine 2% 1:1 mixture. Swish and swallow 1 tablespoon four times a day) 360 mL 2 08/09/2018 at Unknown time  . VENTOLIN HFA 108 (90 Base) MCG/ACT inhaler Inhale 1-2 puffs into the lungs every 4 (four) hours as needed for wheezing or shortness of breath.   11 08/08/2018 at Unknown time  . oxybutynin (DITROPAN) 5 MG tablet Take 1 tablet (5 mg total) by mouth every 6 (six) hours as needed for bladder spasms. (Patient not taking: Reported on 07/24/2018) 20 tablet 0 Not Taking at Unknown time     Social History: Social History   Socioeconomic History  . Marital status: Married    Spouse name: Not on file  . Number of children: Not on file  . Years of education: Not on file  . Highest education level: Not on file  Occupational History  . Not on file  Social Needs  . Financial  resource strain: Somewhat hard  . Food insecurity:    Worry: Never true    Inability: Never true  . Transportation needs:    Medical: No    Non-medical: No  Tobacco Use  . Smoking status: Current Every Day Smoker    Packs/day: 2.00    Years: 50.00    Pack years: 100.00    Types: Cigarettes  . Smokeless tobacco: Never Used  Substance and Sexual Activity  . Alcohol use: Not Currently    Comment: occasionally  . Drug use: No  . Sexual activity: Not Currently    Birth control/protection: None  Lifestyle  . Physical activity:    Days per week: 0 days    Minutes per session: 0 min  . Stress: Not at all  Relationships  . Social connections:    Talks on phone: More than three times a week    Gets together: More than three times a week    Attends religious service: Never    Active member of club  or organization: No    Attends meetings of clubs or organizations: Never    Relationship status: Separated  . Intimate partner violence:    Fear of current or ex partner: No    Emotionally abused: No    Physically abused: No    Forced sexual activity: No  Other Topics Concern  . Not on file  Social History Narrative  . Not on file    Family History: Family History  Problem Relation Age of Onset  . Hypertension Mother   . Asthma Father   . Emphysema Father     Review of Systems: Positive: Gross hematuria, bladder pain, cramps, decreased appetite, weakness Negative:  A further 10 point review of systems was negative except what is listed in the HPI.  Physical Exam: @VITALS2 @ General: Awake.  Moderate distress due to bladder spasms.  He appears pale. Head:  Normocephalic.  Atraumatic. ENT:  EOMI.  Mucous membranes moist Neck:  Supple.  No lymphadenopathy. CV:  Regular rate. Pulmonary: Decreased inspiratory effort Abdomen: Soft.  Non-tender to palpation. Skin:  Normal turgor.  No visible rash. Extremity: No gross deformity of upper extremities.  No gross deformity of lower  extremities. Neurologic: Alert. Appropriate mood.   Studies:  Recent Labs    08/09/18 1438 08/10/18 0716  HGB 3.9* 9.5*  WBC 4.7 4.2  PLT 167 116*    Recent Labs    08/09/18 1438 08/10/18 0716  NA 136 136  K 4.8 4.8  CL 112* 112*  CO2 17* 18*  BUN 44* 39*  CREATININE 5.11* 4.91*  CALCIUM 7.0* 6.7*  GFRNONAA 10* 11*  GFRAA 12* 13*     Recent Labs    08/09/18 1438  INR 1.03     Invalid input(s): ABG  Earlier in the day, I had 22 [place] three-way Foley catheter placed.  It is now on CBI.  I hand irrigated approximately 40 mL of clot out of the patient's bladder.  He does have a very small capacity bladder.  Assessment: Bladder cancer with severe anemia secondary to chronic, worsening gross hematuria.  His bladder cancer is incurable, but the patient tells me that he has had some partial response to his most recent chemotherapy regimen.  Plan: 1.  Continue CBI for now  2.  I will have his nephrostomy tubes irrigated to assure patency  3.  Hopefully, CBI alone will limit his gross hematuria.  If necessary, consider palliative resection, but I also spoke with the patient and his family about palliative/hospice care, and that he will eventually need this.  4.  We will follow during his hospitalization.  Decide later in the week if palliative resection is worthwhile.  He is a significant anesthetic risk.    Pager:[phone]

## 2018-08-10 NOTE — Progress Notes (Signed)
Pt had clot clogging the foley from draining so  Was going to manually irrigate it to clear the clot. Pt refused. Grabbed my hand and his foley and tried to pull the foley out. Had to remove pt hands from foley. While I was trying to unclamp the fluids the pt grabbed the foley and began pulling it again. Pt pulled the fluids loose from the foley. I had to make pt let go of the foley and hook the fluids back up. He was verbally aggressive with me and his wife at the bedside. Pts wife was very apologetic and wanted to make sure he didn't hurt or scratch me.

## 2018-08-10 NOTE — Progress Notes (Signed)
      INFECTIOUS DISEASE ATTENDING ADDENDUM:   Date: 08/10/2018  Patient name: Carl Murphy  Medical record number: [id number]  Date of birth: 06-Jan-1946   Patient with Enterococcal bacteremia  Given urological hx I presume urinary source but I dont know of symptoms beyond hematuruia and he has no ua or urine culture.  START renally dosed AMP  Check TTE, and may need TEE  Send UA and if + culture  Will discuss case with primary     [place] 08/10/2018, 8:20 AM

## 2018-08-10 NOTE — Care Management Note (Addendum)
Case Management Note  Patient Details  Name: Carl Murphy MRN: [id number] Date of Birth: 03/23/1946  Subjective/Objective:   72 y.o. male w/ hx of HTN, HOH, depression, COPD, bladder cancer w/ bilat PCN tubes placed within the past few months. Recent admission at [facility], left AMA.   Now has +BC. Urology and nephrology consulted.                 Action/Plan: CM following for needs.   Expected Discharge Date:   unk            Expected Discharge Plan:  [place]  In-House Referral:     Discharge planning Services  CM Consult  Post Acute Care Choice:    Choice offered to:     DME Arranged:    DME Agency:     HH Arranged:    HH Agency:     Status of Service:  In process, will continue to follow  If discussed at Long Length of Stay Meetings, dates discussed:    Additional Comments:  Antonisha Waskey, Chauncey Reading, RN 08/10/2018, 2:05 PM

## 2018-08-10 NOTE — Progress Notes (Signed)
Initial Nutrition Assessment  DOCUMENTATION CODES:   Non-severe (moderate) malnutrition in context of chronic illness  INTERVENTION:   - Provided education and handouts regarding moist, easy-to-chew foods and nutrition interventions for sore mouth and sore throat  - Magic cup TID with meals, each supplement provides 290 kcal and 9 grams of protein  - Ordered soup to come with lunch and dinner meals  - Encourage adequate PO intake  NUTRITION DIAGNOSIS:   Moderate Malnutrition related to chronic illness, cancer and cancer related treatments (bladder cancer, COPD) as evidenced by mild muscle depletion, percent weight loss (8.9% weight loss in less than 2 months).  GOAL:   Patient will meet greater than or equal to 90% of their needs  MONITOR:   PO intake, Supplement acceptance, Weight trends, Labs, Skin  REASON FOR ASSESSMENT:   Malnutrition Screening Tool    ASSESSMENT:   72 year old male who presented to the ED with low hemoglobin. PMH significant for bladder cancer with bilateral nephrostomy tubes, recurrent anemia, hypertension, depression, and COPD.   Pt found to have +BC. Urology and nephrology consulted.  Spoke with pt and daughter at bedside. Pt drowsy at time of visit and drifting in and out of sleep.  Noted pt was recently admitted to [facility]. Reviewed RD note from this visit.  Pt states that recently his mouth pain and throat pain have gotten more severe. Pt reports that the pain usually cleared up within a few weeks after treatment but that it has persisted and gotten worse.  Pt's daughter reports that pt has been tolerating soups well (sweet and sour soup, chili from [employer], etc.) along with cold or frozen drinks (frosty from [facility]). Pt states that he typically consumes 2-3 meals daily. Pt's daughter shares that pt just started with a home health RN last week who is with pt from 10:00 am to 2:00 pm throughout the week. Per pt's daughter, this has  helped increase pt's PO intake as home health RN can cook or purchase foods for pt and eat with him.  Breakfast: donut Lunch: hamburger and chili from [employer]: similar to lunch or skips  Pt states that he likes Ensure oral nutrition supplements but that they give him diarrhea. Pt endorses taste changes, stating that nothing tastes as good as it used to. Pt's daughter shares that the family tries to get whatever food or drinks that appeal to pt.  Pt's daughter states that it took pt 1 hour to eat 2 pieces of [place] toast and 1 piece of bacon for breakfast this morning. Noted untouched lunch meal tray at bedside. Pt states that he ate a late breakfast which is why he hasn't eaten lunch yet. Pt's daughter cut up meatballs for pt. RD asked if pt would like to have his meal trays come down with the food already cut; pt declined.  Pt reports that his UBW is 180-190 lbs and that he last weighed this about 1 year ago. Per weight history in chart, pt has lost 16 lbs over the past 2 months. This is an 8.9% weight loss which is significant for timeframe. With significant weight loss and mild muscle depletions, pt meets criteria for moderate protein-calorie malnutrition.  Provided handouts and education regarding easy-to-chew foods, tips for sore mouth, and tips for sore throat. Pt's daughter appreciative.  Medications reviewed and include: Ensure Enlive BID - pt refused, IV antibiotics  Labs reviewed: CO2 18 (L), BUN 39 (H), creatinine 4.91 (H), hemoglobin 9.5 (L), HCT 28.3 (L)  NUTRITION - FOCUSED PHYSICAL EXAM:    Most Recent Value  Orbital Region  Mild depletion  Upper Arm Region  No depletion  Thoracic and Lumbar Region  No depletion  Buccal Region  No depletion  Temple Region  No depletion  Clavicle Bone Region  No depletion  Clavicle and Acromion Bone Region  Mild depletion  Scapular Bone Region  Unable to assess  Dorsal Hand  No depletion  Patellar Region  Mild depletion  Anterior  Thigh Region  Mild depletion  Posterior Calf Region  Mild depletion  Edema (RD Assessment)  None  Hair  Reviewed  Eyes  Reviewed  Mouth  Reviewed  Skin  Reviewed  Nails  Reviewed       Diet Order:   Diet Order            Diet Heart Room service appropriate? Yes; Fluid consistency: Thin  Diet effective now              EDUCATION NEEDS:   Education needs have been addressed  Skin:  Skin Assessment: Reviewed RN Assessment  Last BM:  08/09/18 - small type 5  Height:   Ht Readings from Last 1 Encounters:  08/09/18 5\' 8"  (1.727 m)    Weight:   Wt Readings from Last 1 Encounters:  08/10/18 72.3 kg    Ideal Body Weight:  70 kg  BMI:  Body mass index is 24.24 kg/m.  Estimated Nutritional Needs:   Kcal:  2000-2200  Protein:  100-115 grams  Fluid:  >/= 2.0 L    Gaynell Face, MS, RD, LDN Pager: [phone] Weekend/After Hours: [phone]

## 2018-08-10 NOTE — Progress Notes (Signed)
*  PRELIMINARY RESULTS* Echocardiogram 2D Echocardiogram has been performed.  Carl Murphy 08/10/2018, 9:38 AM

## 2018-08-10 NOTE — Consult Note (Signed)
Reason for Consult: Acute kidney injury superimposed on chronic Referring Physician: Dr. Meriam Sprague is an 72 y.o. male.  HPI: He is a patient was history of bladder cancer with metastases, COPD, hypertension, and renal failure possibly obstructive presently came with complaints of weakness, feeling tired and significant hematuria.  When he was evaluated he was found to have worsening of his renal failure hence consult is called.  Patient presently denies any nausea or vomiting.  His appetite however is poor.  He denies also any difficulty in breathing.  Past Medical History:  Diagnosis Date  . Arthritis   . Cancer [facility])    Bladder Cancer  . CHF (congestive heart failure) ([place])   . COPD (chronic obstructive pulmonary disease) ([place])   . Depression   . Hard of hearing    bilat   . Hypertension   . Malignant neoplasm of trigone of bladder ([place]) 03/25/2016  . PONV (postoperative nausea and vomiting)   . Shortness of breath dyspnea    heat; walking up set of stairs; pt states can walk up 12-14 stairs slowly without having to stop to catch his breath    Past Surgical History:  Procedure Laterality Date  . CYSTOSCOPY N/A 06/16/2016   Procedure: CYSTOSCOPY;  Surgeon: Franchot Gallo, MD;  Location: [facility];  Service: Urology;  Laterality: N/A;  . IR FLUORO GUIDE PORT INSERTION RIGHT  06/18/2017  . IR NEPHROSTOMY EXCHANGE LEFT  05/20/2018  . IR NEPHROSTOMY EXCHANGE LEFT  08/03/2018  . IR NEPHROSTOMY EXCHANGE RIGHT  05/20/2018  . IR NEPHROSTOMY EXCHANGE RIGHT  08/03/2018  . IR NEPHROSTOMY PLACEMENT LEFT  03/25/2018  . IR NEPHROSTOMY PLACEMENT RIGHT  03/25/2018  . IR US GUIDE VASC ACCESS RIGHT  06/18/2017  . TRANSURETHRAL RESECTION OF BLADDER TUMOR N/A 03/25/2016   Procedure: TRANSURETHRAL RESECTION OF BLADDER TUMOR (TURBT);  Surgeon: Franchot Gallo, MD;  Location: [facility];  Service: Urology;  Laterality: N/A;  . TRANSURETHRAL RESECTION OF BLADDER TUMOR N/A 06/16/2016   Procedure:  TRANSURETHRAL RESECTION OF BLADDER TUMOR (TURBT);  Surgeon: Franchot Gallo, MD;  Location: [facility];  Service: Urology;  Laterality: N/A;  . TRANSURETHRAL RESECTION OF BLADDER TUMOR N/A 12/16/2016   Procedure: TRANSURETHRAL RESECTION OF BLADDER TUMOR (TURBT);  Surgeon: Franchot Gallo, MD;  Location: [facility];  Service: Urology;  Laterality: N/A;  . TRANSURETHRAL RESECTION OF BLADDER TUMOR N/A 05/11/2017   Procedure: TRANSURETHRAL RESECTION OF BLADDER TUMOR (TURBT) CLOT EVACUATION;  Surgeon: Franchot Gallo, MD;  Location: [facility];  Service: Urology;  Laterality: N/A;  [place] BLUE MEDICARE-[id number]  . TRANSURETHRAL RESECTION OF BLADDER TUMOR N/A 04/01/2018   Procedure: TRANSURETHRAL RESECTION OF BLADDER TUMOR (TURBT);  Surgeon: Franchot Gallo, MD;  Location: [facility];  Service: Urology;  Laterality: N/A;  1 HR [phone] BLUE MEDICARE-[id number]    Family History  Problem Relation Age of Onset  . Hypertension Mother   . Asthma Father   . Emphysema Father     Social History:  reports that he has been smoking cigarettes. He has a 100.00 pack-year smoking history. He has never used smokeless tobacco. He reports that he drank alcohol. He reports that he does not use drugs.  Allergies:  Allergies  Allergen Reactions  . Carboplatin Shortness Of Breath    Reaction occurred after [facility] left [facility]. Pt reported extreme sob after leaving [facility] that eventually resolved on its own.    Medications: I have reviewed the patient's current medications.  Results for orders placed or performed  during the hospital encounter of 08/09/18 (from the past 48 hour(s))  Comprehensive metabolic panel     Status: Abnormal   Collection Time: 08/09/18  2:38 PM  Result Value Ref Range   Sodium 136 135 - 145 mmol/L   Potassium 4.8 3.5 - 5.1 mmol/L   Chloride 112 (H) 98 - 111 mmol/L   CO2 17 (L) 22 - 32 mmol/L   Glucose, Bld 104 (H) 70 - 99 mg/dL   BUN 44 (H) 8 - 23 mg/dL    Creatinine, Ser 5.11 (H) 0.61 - 1.24 mg/dL   Calcium 7.0 (L) 8.9 - 10.3 mg/dL   Total Protein 5.7 (L) 6.5 - 8.1 g/dL   Albumin 2.4 (L) 3.5 - 5.0 g/dL   AST 20 15 - 41 U/L   ALT 9 0 - 44 U/L   Alkaline Phosphatase 54 38 - 126 U/L   Total Bilirubin 0.5 0.3 - 1.2 mg/dL   GFR calc non Af Amer 10 (L) >60 mL/min   GFR calc Af Amer 12 (L) >60 mL/min    Comment: (NOTE) The eGFR has been calculated using the CKD EPI equation. This calculation has not been validated in all clinical situations. eGFR's persistently <60 mL/min signify possible Chronic Kidney Disease.    Anion gap 7 5 - 15    Comment: Performed at [facility], [address], [place], [place] [address]  CBC with Differential     Status: Abnormal   Collection Time: 08/09/18  2:38 PM  Result Value Ref Range   WBC 4.7 4.0 - 10.5 K/uL   RBC 1.27 (L) 4.22 - 5.81 MIL/uL   Hemoglobin 3.9 (LL) 13.0 - 17.0 g/dL    Comment: REPEATED TO VERIFY CRITICAL RESULT CALLED TO, READ BACK BY AND VERIFIED WITH: MINTER,B@1505  BY MATTHEWS, B 9.9.19    HCT 12.2 (L) 39.0 - 52.0 %   MCV 96.1 78.0 - 100.0 fL   MCH 30.7 26.0 - 34.0 pg   MCHC 32.0 30.0 - 36.0 g/dL   RDW 20.7 (H) 11.5 - 15.5 %   Platelets 167 150 - 400 K/uL   Neutrophils Relative % 81 %   Lymphocytes Relative 12 %   Monocytes Relative 7 %   Eosinophils Relative 0 %   Basophils Relative 0 %   Neutro Abs 3.8 1.7 - 7.7 K/uL   Lymphs Abs 0.6 (L) 0.7 - 4.0 K/uL   Monocytes Absolute 0.3 0.1 - 1.0 K/uL   Eosinophils Absolute 0.0 0.0 - 0.7 K/uL   Basophils Absolute 0.0 0.0 - 0.1 K/uL   RBC Morphology RARE NRBCs     Comment: Performed at [facility], [address], [place], [place] [address]  Protime-INR     Status: None   Collection Time: 08/09/18  2:38 PM  Result Value Ref Range   Prothrombin Time 13.4 11.4 - 15.2 seconds   INR 1.03     Comment: Performed at [facility], [address], [place], [place] [address]  Culture, blood (Routine x 2)     Status: None (Preliminary result)    Collection Time: 08/09/18  2:38 PM  Result Value Ref Range   Specimen Description BLOOD RIGHT ANTECUBITAL    Special Requests      BOTTLES DRAWN AEROBIC AND ANAEROBIC Blood Culture adequate volume Performed at [facility], [address], [place], [place] [address]    Culture  Setup Time      GRAM POSITIVE COCCI IN CHAINS IN BOTH AEROBIC AND ANAEROBIC BOTTLES Gram Stain  Report Called to,Read Back By and Verified With: HEARNE,J AT 0630 BY HUFFINES,S ON 08/10/18. CRITICAL VALUE NOTED.  VALUE IS CONSISTENT WITH PREVIOUSLY REPORTED AND CALLED VALUE.    Culture GRAM POSITIVE COCCI    Report Status PENDING   Culture, blood (Routine x 2)     Status: None (Preliminary result)   Collection Time: 08/09/18  3:45 PM  Result Value Ref Range   Specimen Description      BLOOD CENTRAL LINE Performed at [facility], [address], [place], [place] [address]    Special Requests      BOTTLES DRAWN AEROBIC AND ANAEROBIC Blood Culture adequate volume Performed at [facility], [address], [place], [place] [address]    Culture  Setup Time      GRAM POSITIVE COCCI Gram Stain Report Called to,Read Back By and Verified With: HEARN,J.  [id number] ON 08/10/2018 BY EVA IN BOTH AEROBIC AND ANAEROBIC BOTTLES Performed at [place] ID to follow CRITICAL RESULT CALLED TO, READ BACK BY AND VERIFIED WITHJohny Chess RN [phone] 08/10/18 A BROWNING Performed at [facility], [place] [address] [place], [place] [address]    Culture PENDING    Report Status PENDING   Lactic acid, plasma     Status: None   Collection Time: 08/09/18  3:45 PM  Result Value Ref Range   Lactic Acid, Venous 1.6 0.5 - 1.9 mmol/L    Comment: Performed at [facility], [address] [place], [place] [address]  Blood Culture ID Panel (Reflexed)     Status: Abnormal   Collection Time: 08/09/18  3:45 PM  Result Value Ref Range   Enterococcus species DETECTED (A) NOT DETECTED    Comment: CRITICAL RESULT CALLED TO, READ  BACK BY AND VERIFIED WITH: Johny Chess RN [phone] 08/10/18 A BROWNING    Vancomycin resistance NOT DETECTED NOT DETECTED   Listeria monocytogenes NOT DETECTED NOT DETECTED   Staphylococcus species NOT DETECTED NOT DETECTED   Staphylococcus aureus NOT DETECTED NOT DETECTED   Streptococcus species NOT DETECTED NOT DETECTED   Streptococcus agalactiae NOT DETECTED NOT DETECTED   Streptococcus pneumoniae NOT DETECTED NOT DETECTED   Streptococcus pyogenes NOT DETECTED NOT DETECTED   Acinetobacter baumannii NOT DETECTED NOT DETECTED   Enterobacteriaceae species NOT DETECTED NOT DETECTED   Enterobacter cloacae complex NOT DETECTED NOT DETECTED   Escherichia coli NOT DETECTED NOT DETECTED   Klebsiella oxytoca NOT DETECTED NOT DETECTED   Klebsiella pneumoniae NOT DETECTED NOT DETECTED   Proteus species NOT DETECTED NOT DETECTED   Serratia marcescens NOT DETECTED NOT DETECTED   Haemophilus influenzae NOT DETECTED NOT DETECTED   Neisseria meningitidis NOT DETECTED NOT DETECTED   Pseudomonas aeruginosa NOT DETECTED NOT DETECTED   Candida albicans NOT DETECTED NOT DETECTED   Candida glabrata NOT DETECTED NOT DETECTED   Candida krusei NOT DETECTED NOT DETECTED   Candida parapsilosis NOT DETECTED NOT DETECTED   Candida tropicalis NOT DETECTED NOT DETECTED    Comment: Performed at [facility], [address] [place], [place] [address]  Urinalysis, Routine w reflex microscopic     Status: Abnormal   Collection Time: 08/10/18  1:20 AM  Result Value Ref Range   Color, Urine RED (A) YELLOW    Comment: BIOCHEMICALS MAY BE AFFECTED BY COLOR   APPearance TURBID (A) CLEAR   Specific Gravity, Urine  1.005 - 1.030    TEST NOT REPORTED DUE TO COLOR INTERFERENCE OF URINE PIGMENT   pH  5.0 -  8.0    TEST NOT REPORTED DUE TO COLOR INTERFERENCE OF URINE PIGMENT   Glucose, UA (A) NEGATIVE mg/dL    TEST NOT REPORTED DUE TO COLOR INTERFERENCE OF URINE PIGMENT   Hgb urine dipstick (A) NEGATIVE    TEST NOT  REPORTED DUE TO COLOR INTERFERENCE OF URINE PIGMENT   Bilirubin Urine (A) NEGATIVE    TEST NOT REPORTED DUE TO COLOR INTERFERENCE OF URINE PIGMENT   Ketones, ur (A) NEGATIVE mg/dL    TEST NOT REPORTED DUE TO COLOR INTERFERENCE OF URINE PIGMENT   Protein, ur (A) NEGATIVE mg/dL    TEST NOT REPORTED DUE TO COLOR INTERFERENCE OF URINE PIGMENT   Nitrite (A) NEGATIVE    TEST NOT REPORTED DUE TO COLOR INTERFERENCE OF URINE PIGMENT   Leukocytes, UA (A) NEGATIVE    TEST NOT REPORTED DUE TO COLOR INTERFERENCE OF URINE PIGMENT    Comment: Performed at [facility], [address], [place], [place] [address]  Urinalysis, Microscopic (reflex)     Status: Abnormal   Collection Time: 08/10/18  1:20 AM  Result Value Ref Range   RBC / HPF >50 0 - 5 RBC/hpf   WBC, UA >50 0 - 5 WBC/hpf   Bacteria, UA RARE (A) NONE SEEN   Squamous Epithelial / LPF NONE SEEN 0 - 5   Mucus PRESENT     Comment: Performed at [facility], [address], [place], [place] [address]  CBC     Status: Abnormal   Collection Time: 08/10/18  7:16 AM  Result Value Ref Range   WBC 4.2 4.0 - 10.5 K/uL   RBC 3.10 (L) 4.22 - 5.81 MIL/uL   Hemoglobin 9.5 (L) 13.0 - 17.0 g/dL    Comment: POST TRANSFUSION SPECIMEN DELTA CHECK NOTED    HCT 28.3 (L) 39.0 - 52.0 %   MCV 91.3 78.0 - 100.0 fL   MCH 30.6 26.0 - 34.0 pg   MCHC 33.6 30.0 - 36.0 g/dL   RDW 16.4 (H) 11.5 - 15.5 %   Platelets 116 (L) 150 - 400 K/uL    Comment: SPECIMEN CHECKED FOR CLOTS PLATELET COUNT CONFIRMED BY SMEAR Performed at [facility], [address], [place], [place] [address]   Basic metabolic panel     Status: Abnormal   Collection Time: 08/10/18  7:16 AM  Result Value Ref Range   Sodium 136 135 - 145 mmol/L   Potassium 4.8 3.5 - 5.1 mmol/L   Chloride 112 (H) 98 - 111 mmol/L   CO2 18 (L) 22 - 32 mmol/L   Glucose, Bld 81 70 - 99 mg/dL   BUN 39 (H) 8 - 23 mg/dL   Creatinine, Ser 4.91 (H) 0.61 - 1.24 mg/dL   Calcium 6.7 (L) 8.9 - 10.3 mg/dL   GFR calc non  Af Amer 11 (L) >60 mL/min   GFR calc Af Amer 13 (L) >60 mL/min    Comment: (NOTE) The eGFR has been calculated using the CKD EPI equation. This calculation has not been validated in all clinical situations. eGFR's persistently <60 mL/min signify possible Chronic Kidney Disease.    Anion gap 6 5 - 15    Comment: Performed at [facility], [address] [place], [place] [address]  Urinalysis, Routine w reflex microscopic     Status: Abnormal   Collection Time: 08/10/18 10:15 AM  Result Value Ref Range   Color, Urine YELLOW YELLOW   APPearance HAZY (A) CLEAR   Specific Gravity, Urine 1.011 1.005 - 1.030  pH 7.0 5.0 - 8.0   Glucose, UA NEGATIVE NEGATIVE mg/dL   Hgb urine dipstick LARGE (A) NEGATIVE   Bilirubin Urine NEGATIVE NEGATIVE   Ketones, ur NEGATIVE NEGATIVE mg/dL   Protein, ur 100 (A) NEGATIVE mg/dL   Nitrite NEGATIVE NEGATIVE   Leukocytes, UA MODERATE (A) NEGATIVE   RBC / HPF >50 (H) 0 - 5 RBC/hpf   WBC, UA >50 (H) 0 - 5 WBC/hpf   Bacteria, UA FEW (A) NONE SEEN   WBC Clumps PRESENT     Comment: Performed at [facility], [address], [place], [place] [address]    Dg Chest 2 View  Result Date: 08/09/2018 CLINICAL DATA:  Possible sepsis, short of breath and weak EXAM: CHEST - 2 VIEW COMPARISON:  07/24/2018, CT chest 06/07/2018 FINDINGS: Right-sided central venous port tip over the proximal right atrium. 3 cm left upper lobe lung mass. Increased size of right perihilar mass, measuring 3.1 cm. Increased conspicuity right upper lobe lung mass. No pleural effusion. Hyperinflation with emphysematous disease. Normal heart size with aortic atherosclerosis. No pneumothorax. IMPRESSION: 1. Increased size and conspicuity of right-sided lung masses as compared to prior radiograph. Stable left upper lobe lung mass. 2. Hyperinflation with emphysematous disease. 3. No acute pulmonary infiltrate. Electronically Signed   By: Donavan Foil M.D.   On: 08/09/2018 14:55    Review of Systems   Constitutional: Positive for malaise/fatigue. Negative for chills.  Respiratory: Negative for shortness of breath.   Cardiovascular: Negative for chest pain, orthopnea and leg swelling.  Gastrointestinal: Negative for abdominal pain, nausea and vomiting.  Genitourinary: Positive for hematuria.  Neurological: Positive for weakness. Negative for dizziness.   Blood pressure 129/65, pulse (!) 51, temperature 98 F (36.7 C), temperature source Oral, resp. rate 13, height 5' 8"  (1.727 m), weight 72.3 kg, SpO2 100 %. Physical Exam  Constitutional: He is oriented to person, place, and time. No distress.  Eyes: No scleral icterus.  Neck: No JVD present.  Cardiovascular: Normal rate and regular rhythm.  Respiratory: No respiratory distress. He has no wheezes.  GI: He exhibits no distension. There is no tenderness.  Genitourinary:  Genitourinary Comments: Patient with bilateral percutaneous nephrostomy tube placement.  Musculoskeletal: He exhibits no edema.  Neurological: He is alert and oriented to person, place, and time.    Assessment/Plan: 1] renal failure: Seems to be acute on chronic.  His creatinine was 1.19 on 12/11/2016 and increased to 2.7 on 11/11/2017.  On 04/20/2018 creatinine has increased to 2.9.  Since then his creatinine has been fluctuating.  Patient was found to have obstructive uropathy related to his cancer.  He has nephrostomy tube placement.  There is drainage from the left tube but none on the right.  His creatinine seems to be improving slightly.  At this moment we may be dealing with obstructive uropathy versus prerenal versus ATN. 2] hypertension: Patient was admitted with hypotension presently his blood pressure seems to be better 3] history of bladder cancer with metastasis.  Patient has received chemotherapy and with shrinking of his lung mass. 4] anemia: Possibly from blood loss 5] history of COPD 6] fluid management: No significant sign of fluid overload. Plan: 1]We  will do ultrasound of the kidneys 2] we will start patient on normal saline at 125 cc/h 3] we will use Lasix 60 mg IV twice daily. 4] we will check CBC and renal panel in the morning.   Margrette Wynia S 08/10/2018, 2:47 PM

## 2018-08-11 DIAGNOSIS — D62 Acute posthemorrhagic anemia: Secondary | ICD-10-CM | POA: Diagnosis not present

## 2018-08-11 LAB — TYPE AND SCREEN
ABO/RH(D): O POS
ANTIBODY SCREEN: NEGATIVE
Unit division: 0
Unit division: 0
Unit division: 0
Unit division: 0

## 2018-08-11 LAB — RENAL FUNCTION PANEL
ALBUMIN: 2.2 g/dL — AB (ref 3.5–5.0)
ANION GAP: 5 (ref 5–15)
BUN: 35 mg/dL — ABNORMAL HIGH (ref 8–23)
CALCIUM: 7 mg/dL — AB (ref 8.9–10.3)
CO2: 19 mmol/L — AB (ref 22–32)
CREATININE: 4.55 mg/dL — AB (ref 0.61–1.24)
Chloride: 115 mmol/L — ABNORMAL HIGH (ref 98–111)
GFR calc non Af Amer: 12 mL/min — ABNORMAL LOW (ref >60)
GFR, EST AFRICAN AMERICAN: 14 mL/min — AB (ref >60)
GLUCOSE: 84 mg/dL (ref 70–99)
PHOSPHORUS: 3.8 mg/dL (ref 2.5–4.6)
Potassium: 4.8 mmol/L (ref 3.5–5.1)
SODIUM: 139 mmol/L (ref 135–145)

## 2018-08-11 LAB — CBC
HCT: 29.3 % — ABNORMAL LOW (ref 39.0–52.0)
HEMOGLOBIN: 9.8 g/dL — AB (ref 13.0–17.0)
MCH: 30.3 pg (ref 26.0–34.0)
MCHC: 33.4 g/dL (ref 30.0–36.0)
MCV: 90.7 fL (ref 78.0–100.0)
PLATELETS: 144 10*3/uL — AB (ref 150–400)
RBC: 3.23 MIL/uL — AB (ref 4.22–5.81)
RDW: 17.2 % — ABNORMAL HIGH (ref 11.5–15.5)
WBC: 5.6 10*3/uL (ref 4.0–10.5)

## 2018-08-11 LAB — BPAM RBC
BLOOD PRODUCT EXPIRATION DATE: 201910062359
BLOOD PRODUCT EXPIRATION DATE: 201910062359
BLOOD PRODUCT EXPIRATION DATE: 201910112359
Blood Product Expiration Date: 201910062359
ISSUE DATE / TIME: 201909091636
ISSUE DATE / TIME: 201909091940
ISSUE DATE / TIME: 201909100149
ISSUE DATE / TIME: 201909092302
UNIT TYPE AND RH: 5100
Unit Type and Rh: 5100
Unit Type and Rh: 5100
Unit Type and Rh: 5100

## 2018-08-11 MED ORDER — MORPHINE SULFATE (PF) 2 MG/ML IV SOLN
2.0000 mg | INTRAVENOUS | Status: DC | PRN
  Administered 2018-08-11 – 2018-08-14 (×12): 2 mg via INTRAVENOUS
  Filled 2018-08-11 (×12): qty 1

## 2018-08-11 MED ORDER — BELLADONNA ALKALOIDS-OPIUM 16.2-60 MG RE SUPP
60.0000 mg | Freq: Four times a day (QID) | RECTAL | Status: DC | PRN
  Administered 2018-08-11: 1 via RECTAL
  Filled 2018-08-11: qty 1

## 2018-08-11 MED ORDER — CHLORHEXIDINE GLUCONATE 0.12 % MT SOLN
15.0000 mL | Freq: Four times a day (QID) | OROMUCOSAL | Status: DC
  Administered 2018-08-11 – 2018-08-17 (×11): 15 mL via OROMUCOSAL
  Filled 2018-08-11 (×9): qty 15

## 2018-08-11 MED ORDER — OXYCODONE-ACETAMINOPHEN 5-325 MG PO TABS
1.0000 | ORAL_TABLET | ORAL | Status: DC | PRN
  Administered 2018-08-11 – 2018-08-12 (×6): 1 via ORAL
  Filled 2018-08-11 (×6): qty 1

## 2018-08-11 MED ORDER — MAGIC MOUTHWASH W/LIDOCAINE
10.0000 mL | Freq: Four times a day (QID) | ORAL | Status: DC | PRN
  Filled 2018-08-11: qty 10

## 2018-08-11 NOTE — Progress Notes (Signed)
Subjective: Urine is light pink on slow drip CBI. neph tubes light pink. Hemoglobin 9.8,  Objective: Vital signs in last 24 hours: Temp:  [97.4 F (36.3 C)-97.7 F (36.5 C)] 97.4 F (36.3 C) (09/11 0700) Pulse Rate:  [52-128] 53 (09/11 1300) Resp:  [12-21] 13 (09/11 1300) BP: (92-171)/(44-110) 128/77 (09/11 1300) SpO2:  [85 %-100 %] 95 % (09/11 1451) Weight:  [74.9 kg] 74.9 kg (09/11 0400)  Intake/Output from previous day: 09/10 0701 - 09/11 0700 In: 21912 [P.O.:372; I.V.:440; IV Piggyback:100] Out: 23925 [Urine:23925] Intake/Output this shift: Total I/O In: 10046.3 [I.V.:2396.3; Other:7350; IV Piggyback:300] Out: 2500 [Urine:2500]  Physical Exam:  General:alert, cooperative and appears stated age GI: soft, non tender, normal bowel sounds, no palpable masses, no organomegaly, no inguinal hernia Male genitalia: not done Extremities: extremities normal, atraumatic, no cyanosis or edema  Lab Results: Recent Labs    08/09/18 1438 08/10/18 0716 08/11/18 0400  HGB 3.9* 9.5* 9.8*  HCT 12.2* 28.3* 29.3*   BMET Recent Labs    08/10/18 0716 08/11/18 0400  NA 136 139  K 4.8 4.8  CL 112* 115*  CO2 18* 19*  GLUCOSE 81 84  BUN 39* 35*  CREATININE 4.91* 4.55*  CALCIUM 6.7* 7.0*   Recent Labs    08/09/18 1438  INR 1.03   No results for input(s): LABURIN in the last 72 hours. Results for orders placed or performed during the hospital encounter of 08/09/18  Culture, blood (Routine x 2)     Status: Abnormal (Preliminary result)   Collection Time: 08/09/18  2:38 PM  Result Value Ref Range Status   Specimen Description   Final    BLOOD RIGHT ANTECUBITAL Performed at [facility], [address], [place], [place] [address]    Special Requests   Final    BOTTLES DRAWN AEROBIC AND ANAEROBIC Blood Culture adequate volume Performed at [place] [place], [place] [address]    Culture  Setup Time   Final    GRAM POSITIVE COCCI IN CHAINS IN BOTH AEROBIC  AND ANAEROBIC BOTTLES Gram Stain Report Called to,Read Back By and Verified With: HEARNE,J AT 0630 BY HUFFINES,S ON 08/10/18. CRITICAL VALUE NOTED.  VALUE IS CONSISTENT WITH PREVIOUSLY REPORTED AND CALLED VALUE.    Culture ENTEROCOCCUS FAECALIS (A)  Final   Report Status PENDING  Incomplete  Culture, blood (Routine x 2)     Status: Abnormal (Preliminary result)   Collection Time: 08/09/18  3:45 PM  Result Value Ref Range Status   Specimen Description   Final    BLOOD CENTRAL LINE Performed at [facility], [address], [place], [place] [address]    Special Requests   Final    BOTTLES DRAWN AEROBIC AND ANAEROBIC Blood Culture adequate volume Performed at [facility], [address], [place], [place] [address]    Culture  Setup Time   Final    GRAM POSITIVE COCCI Gram Stain Report Called to,Read Back By and Verified With: HEARN,J.  [id number] ON 08/10/2018 BY EVA IN BOTH AEROBIC AND ANAEROBIC BOTTLES Performed at [place], READ BACK BY AND VERIFIED WITHJohny Chess RN [phone] 08/10/18 A BROWNING    Culture (A)  Final    ENTEROCOCCUS FAECALIS SUSCEPTIBILITIES TO FOLLOW Performed at [facility], [place] [address], [place], [place] [address]    Report Status PENDING  Incomplete  Blood Culture ID Panel (Reflexed)     Status: Abnormal   Collection Time: 08/09/18  3:45  PM  Result Value Ref Range Status   Enterococcus species DETECTED (A) NOT DETECTED Final    Comment: CRITICAL RESULT CALLED TO, READ BACK BY AND VERIFIED WITH: Johny Chess RN [phone] 08/10/18 A BROWNING    Vancomycin resistance NOT DETECTED NOT DETECTED Final   Listeria monocytogenes NOT DETECTED NOT DETECTED Final   Staphylococcus species NOT DETECTED NOT DETECTED Final   Staphylococcus aureus NOT DETECTED NOT DETECTED Final   Streptococcus species NOT DETECTED NOT DETECTED Final   Streptococcus agalactiae NOT DETECTED NOT DETECTED Final   Streptococcus pneumoniae NOT DETECTED NOT DETECTED  Final   Streptococcus pyogenes NOT DETECTED NOT DETECTED Final   Acinetobacter baumannii NOT DETECTED NOT DETECTED Final   Enterobacteriaceae species NOT DETECTED NOT DETECTED Final   Enterobacter cloacae complex NOT DETECTED NOT DETECTED Final   Escherichia coli NOT DETECTED NOT DETECTED Final   Klebsiella oxytoca NOT DETECTED NOT DETECTED Final   Klebsiella pneumoniae NOT DETECTED NOT DETECTED Final   Proteus species NOT DETECTED NOT DETECTED Final   Serratia marcescens NOT DETECTED NOT DETECTED Final   Haemophilus influenzae NOT DETECTED NOT DETECTED Final   Neisseria meningitidis NOT DETECTED NOT DETECTED Final   Pseudomonas aeruginosa NOT DETECTED NOT DETECTED Final   Candida albicans NOT DETECTED NOT DETECTED Final   Candida glabrata NOT DETECTED NOT DETECTED Final   Candida krusei NOT DETECTED NOT DETECTED Final   Candida parapsilosis NOT DETECTED NOT DETECTED Final   Candida tropicalis NOT DETECTED NOT DETECTED Final    Comment: Performed at [facility], [address] [place], [place] [address]    Studies/Results: US Renal  Result Date: 08/10/2018 CLINICAL DATA:  72 year old male with acute on chronic renal failure. Bilateral nephrostomy tubes. TURP. Bladder cancer. Subsequent encounter. EXAM: RENAL / URINARY TRACT ULTRASOUND COMPLETE COMPARISON:  08/03/2018 and 07/25/2018. FINDINGS: Right Kidney: Length: 9.2 cm. Increased echogenicity with renal parenchymal thinning. No hydronephrosis. Nephrostomy tube in place. Left Kidney: Length: 10.6 cm. Echogenicity within normal limits. No hydronephrosis. Nephrostomies tube in place. Bladder: Decompressed by catheter.  Bladder wall thickening. IMPRESSION: 1. No hydronephrosis.  Nephrostomy tubes in place. 2. Increased echogenicity and renal parenchymal thinning of the right kidney. 3. Decompressed urinary bladder with catheter in place. Bladder wall thickening. Please see 07/25/2018 CT report. Electronically Signed   By: Genia Del  M.D.   On: 08/10/2018 17:54    Assessment/Plan: 72 yo with Metastatic bladder cancer, gross Hematuria  1. Urine is clearing on CBI. Continue to wean CBI to off. His hemoglobin has remained stable. The patient's family and the patient are requesting cystoscopy with fulgeration. We discussed the anesthetic risk associated with his surgery and the patient still wishes to proceed. I will discuss the case with Dr. Diona Fanti and we will get him scheduled for his procedure.    LOS: 1 day   Nicolette Bang 08/11/2018, 4:38 PM

## 2018-08-11 NOTE — Progress Notes (Signed)
Carl Murphy  MRN: [id number]  DOB/AGE: 1946-09-26 72 y.o.  Primary Care Physician:Fagan, Carloyn Manner, MD  Admit date: 08/09/2018  Chief Complaint:  Chief Complaint  Patient presents with  . Abnormal Lab    S-Pt presented on  08/09/2018 with  Chief Complaint  Patient presents with  . Abnormal Lab  .    Pt  Is lethargic ( recently received Morphine)    Pt family was present in the room, their main concern was about inability to get belladonna last night.    I tried to answer their quires to best of my ability.    Meds . sodium chloride   Intravenous Once  . Chlorhexidine Gluconate Cloth  6 each Topical Daily  . citalopram  20 mg Oral Daily  . Influenza vac split quadrivalent PF  0.5 mL Intramuscular Tomorrow-1000  . ipratropium-albuterol  3 mL Nebulization TID  . nicotine  14 mg Transdermal Daily  . sodium chloride flush  10-40 mL Intracatheter Q12H  . sodium chloride flush  3 mL Intravenous Q12H  . sucralfate  1 g Oral TID WC & HS         Physical Exam: Vital signs in last 24 hours: Temp:  [97.4 F (36.3 C)-98 F (36.7 C)] 97.4 F (36.3 C) (09/11 0700) Pulse Rate:  [51-128] 56 (09/11 0300) Resp:  [11-24] 14 (09/11 0300) BP: (92-171)/(44-110) 152/91 (09/11 0300) SpO2:  [85 %-100 %] 94 % (09/11 0746) Weight:  [74.9 kg] 74.9 kg (09/11 0400) Weight change: 0.056 kg Last BM Date: 08/10/18  Intake/Output from previous day: 09/10 0701 - 09/11 0700 In: 21912 [P.O.:372; I.V.:440; IV Piggyback:100] Out: 23925 [Urine:23925] No intake/output data recorded.   Physical Exam: General- pt is lethargic but arousable  Resp- No acute REsp distress, decreased BS at bases CVS- S1S2 regular in rate and rhythm GIT- BS+, soft, NT, ND EXT- NO LE Edema, Cyanosis   Lab Results: CBC Recent Labs    08/10/18 0716 08/11/18 0400  WBC 4.2 5.6  HGB 9.5* 9.8*  HCT 28.3* 29.3*  PLT 116* 144*    BMET Recent Labs    08/10/18 0716 08/11/18 0400  NA 136 139  K 4.8 4.8  CL  112* 115*  CO2 18* 19*  GLUCOSE 81 84  BUN 39* 35*  CREATININE 4.91* 4.55*  CALCIUM 6.7* 7.0*   Creat trend 2019 5.1=>4.5 2.1--5.5 2018  1.8--2.8 2017 1.4--1.6   MICRO Recent Results (from the past 240 hour(s))  Culture, blood (Routine x 2)     Status: None (Preliminary result)   Collection Time: 08/09/18  2:38 PM  Result Value Ref Range Status   Specimen Description BLOOD RIGHT ANTECUBITAL  Final   Special Requests   Final    BOTTLES DRAWN AEROBIC AND ANAEROBIC Blood Culture adequate volume Performed at [facility] [place],  [address]    Culture  Setup Time   Final    GRAM POSITIVE COCCI IN CHAINS IN BOTH AEROBIC AND ANAEROBIC BOTTLES Gram Stain Report Called to,Read Back By and Verified With: HEARNE,J AT 0630 BY HUFFINES,S ON 08/10/18. CRITICAL VALUE NOTED.  VALUE IS CONSISTENT WITH PREVIOUSLY REPORTED AND CALLED VALUE.    Culture GRAM POSITIVE COCCI  Final   Report Status PENDING  Incomplete  Culture, blood (Routine x 2)     Status: None (Preliminary result)   Collection Time: 08/09/18  3:45 PM  Result Value Ref Range Status   Specimen Description BLOOD CENTRAL LINE  Final   Special  Requests   Final    BOTTLES DRAWN AEROBIC AND ANAEROBIC Blood Culture adequate volume Performed at [facility], [address] [place], [place] [address]    Culture  Setup Time   Final    GRAM POSITIVE COCCI Gram Stain Report Called to,Read Back By and Verified With: HEARN,J.  [id number] ON 08/10/2018 BY EVA IN BOTH AEROBIC AND ANAEROBIC BOTTLES Performed at [place], READ BACK BY AND VERIFIED WITH: Johny Chess RN [phone] 08/10/18 A BROWNING    Culture GRAM POSITIVE COCCI  Final   Report Status PENDING  Incomplete  Blood Culture ID Panel (Reflexed)     Status: Abnormal   Collection Time: 08/09/18  3:45 PM  Result Value Ref Range Status   Enterococcus species DETECTED (A) NOT DETECTED Final    Comment: CRITICAL RESULT CALLED TO, READ  BACK BY AND VERIFIED WITH: Johny Chess RN [phone] 08/10/18 A BROWNING    Vancomycin resistance NOT DETECTED NOT DETECTED Final   Listeria monocytogenes NOT DETECTED NOT DETECTED Final   Staphylococcus species NOT DETECTED NOT DETECTED Final   Staphylococcus aureus NOT DETECTED NOT DETECTED Final   Streptococcus species NOT DETECTED NOT DETECTED Final   Streptococcus agalactiae NOT DETECTED NOT DETECTED Final   Streptococcus pneumoniae NOT DETECTED NOT DETECTED Final   Streptococcus pyogenes NOT DETECTED NOT DETECTED Final   Acinetobacter baumannii NOT DETECTED NOT DETECTED Final   Enterobacteriaceae species NOT DETECTED NOT DETECTED Final   Enterobacter cloacae complex NOT DETECTED NOT DETECTED Final   Escherichia coli NOT DETECTED NOT DETECTED Final   Klebsiella oxytoca NOT DETECTED NOT DETECTED Final   Klebsiella pneumoniae NOT DETECTED NOT DETECTED Final   Proteus species NOT DETECTED NOT DETECTED Final   Serratia marcescens NOT DETECTED NOT DETECTED Final   Haemophilus influenzae NOT DETECTED NOT DETECTED Final   Neisseria meningitidis NOT DETECTED NOT DETECTED Final   Pseudomonas aeruginosa NOT DETECTED NOT DETECTED Final   Candida albicans NOT DETECTED NOT DETECTED Final   Candida glabrata NOT DETECTED NOT DETECTED Final   Candida krusei NOT DETECTED NOT DETECTED Final   Candida parapsilosis NOT DETECTED NOT DETECTED Final   Candida tropicalis NOT DETECTED NOT DETECTED Final    Comment: Performed at [facility], [address], [place], [place] [address]      Lab Results  Component Value Date   CALCIUM 7.0 (L) 08/11/2018   PHOS 3.8 08/11/2018   Alb 2.2 Corrected calcium 7.0+1.4=8.4     Impression: 1)Renal  AKI secondary to Post renal/ATN               AKI on CKD               CKD stage 4 .               CKD since 2017               CKD secondary to Post renal( hx of bladder ca)                Progression of CKD marked with AKI                             AKI  better                Creat trending down.   2)HTN BP stable  3)Anemia HGb now much better.  4)CKD Mineral-Bone Disorder Phosphorus at goal.  Calcium near to gaol after correcting for low albumin.   5)Oncology-Hx of metastatic bladder ca Urology and  Primary MD following  6)Electrolytes Normokalemic NOrmonatremic   7)Acid base Co2 not at goal better    Plan:  Will continue current care     [place] S 08/11/2018, 9:17 AM

## 2018-08-11 NOTE — Progress Notes (Signed)
There is evidence that there is less bleeding.  Hemoglobin stable on CBI.  I will look in setting up cystoscopy with fulguration/palliative resection on Friday.  This will need to be done at [facility] for better postoperative management.  Please see about working on transfer to [place] long on Thursday afternoon or Friday morning.  I will attempt to put him on for Friday afternoon.

## 2018-08-11 NOTE — Consult Note (Signed)
Consult noted.  Will remove portacath tomorrow in am.

## 2018-08-11 NOTE — Progress Notes (Addendum)
PROGRESS NOTE    Carl Murphy  IDP:[id number] DOB: 10/17/1946 DOA: 08/09/2018 PCP: [name], MD     Brief Narrative:  72 year old man admitted from home on 9/9 due to hematuria, fatigue and weakness.  He has a history of bladder cancer with bilateral nephrostomy tubes placed in June of this year.  He was just recently started on chemotherapy and got a dose recently but became too weak to continue the drug.  Upon arrival he was found to have a hemoglobin of 3.7 and admission was requested.  Subsequently on 9/10 blood cultures have resulted positive for enterococcus.   Assessment & Plan:   Principal Problem:   Acute blood loss anemia Active Problems:   Malignant neoplasm of trigone of bladder (HCC)   Gross hematuria   COPD (chronic obstructive pulmonary disease) (HCC)   Tobacco dependence   Hematuria   Bilateral hydronephrosis   Acute renal failure superimposed on stage 4 chronic kidney disease (HCC)   Nephrostomy status ([place]), bilateral   Enterococcal bacteremia   Malnutrition of moderate degree   Acute blood loss anemia -Secondary to bladder cancer with hematuria- cystoscopy with fulguration/palliative resection possibly on 08/13/2018 at [facility] by Dr. Diona Fanti -He is status post 4 units of PRBCs -hemoglobin currently stable at 9.8 up from 9.5on 08/10/18,  at the low hemoglobin was as low as 3.9 prior to transfusion, plan to treat with continued transfusions if needed for hemoglobin less than 7.  Enterococcal bacteremia/septicemia--  -Blood cultures were drawn on admission positive for enterococcus, repeat blood cultures from 08/11/2018 pending, unable to take Port-A-Cath out as patient has no other venous access at this time--discussed case via phone with ID, Dr. Drucilla Schmidt, he recommends ampicillin 2 g IV every 8 hours for at least 2 weeks from the date of Port-A-Cath removal , transthoracic echo without vegetations  -Suspect source to be urinary, especially given his  bilateral nephrostomy tubes.  Hypertension -Blood pressure is soft but stable, all oral antihypertensives are on hold.  COPD -Compensated, at baseline  Acute on chronic kidney disease stage IV-V -Baseline creatinine appears to be around 4-4.8, creatinine was 5.1 and admission, creatinine back to baseline around 4.4 -Suspect a combination of ATN from severe anemia plus obstructive issues with bladder cancer. -Continue IV fluids,    DVT prophylaxis: SCDs Code Status: Limited code Family Communication:  Son and son-in-law at bedside updated on plan of care and all questions answered Disposition Plan: Possible transfer to [facility] hospital on 08/12/2018 for further urological intervention being planned for 08/13/2018.by Dr Diona Fanti  Consultants:   Urology   ID consultation via phone  Procedures:   TTE Echo w/o vegetations  Antimicrobials:  Anti-infectives (From admission, onward)   Start     Dose/Rate Route Frequency Ordered Stop   08/10/18 1200  ampicillin (OMNIPEN) 2 g in sodium chloride 0.9 % 100 mL IVPB     2 g 300 mL/hr over 20 Minutes Intravenous Every 8 hours 08/10/18 1049     08/10/18 0830  ampicillin (OMNIPEN) 2 g in sodium chloride 0.9 % 100 mL IVPB  Status:  Discontinued     2 g 300 mL/hr over 20 Minutes Intravenous Every 6 hours 08/10/18 0819 08/10/18 1049       Subjective: Patient son and son-in-law at bedside, questions answered, complains of generalized aches and pain, no fevers no chills, no productive cough, no vomiting or diarrhea, appetite is poor  Objective: Vitals:   08/11/18 1451 08/11/18 1600 08/11/18 1700 08/11/18  1800  BP:  (!) 133/118 (!) 144/84 (!) 165/90  Pulse:  60 (!) 54 66  Resp:  16 19 12   Temp:      TempSrc:      SpO2: 95% 94% 92% 92%  Weight:      Height:        Intake/Output Summary (Last 24 hours) at 08/11/2018 1855 Last data filed at 08/11/2018 1503 Gross per 24 hour  Intake 31046.3 ml  Output 17700 ml  Net 13346.3 ml    Filed Weights   08/09/18 2246 08/10/18 0434 08/11/18 0400  Weight: 72.3 kg 72.3 kg 74.9 kg    Examination:  General exam: Alert, awake, oriented x 3, resting after pain meds Neck-Port-A-Cath site is clean dry and intact Respiratory system: Diminished in bases, no wheezing  cardiovascular system:RRR. No murmurs, rubs, gallops. Gastrointestinal system: Abdomen is nondistended, soft and nontender. No organomegaly or masses felt. Normal bowel sounds heard. Central nervous system: Alert and oriented. No focal neurological deficits. Extremities: No C/C/E, +pedal pulses Skin: No rashes, lesions or ulcers Psychiatry: Judgement and insight appear normal. Mood & affect appropriate.  GU-three-way bladder irrigation progress, urine less bloody   Data Reviewed: I have personally reviewed following labs and imaging studies  CBC: Recent Labs  Lab 08/09/18 1220 08/09/18 1438 08/10/18 0716 08/11/18 0400  WBC 4.5 4.7 4.2 5.6  NEUTROABS 3.6 3.8  --   --   HGB 4.3* 3.9* 9.5* 9.8*  HCT 13.2* 12.2* 28.3* 29.3*  MCV 96.4 96.1 91.3 90.7  PLT 182 167 116* 474*   Basic Metabolic Panel: Recent Labs  Lab 08/09/18 1438 08/10/18 0716 08/11/18 0400  NA 136 136 139  K 4.8 4.8 4.8  CL 112* 112* 115*  CO2 17* 18* 19*  GLUCOSE 104* 81 84  BUN 44* 39* 35*  CREATININE 5.11* 4.91* 4.55*  CALCIUM 7.0* 6.7* 7.0*  PHOS  --   --  3.8   GFR: Estimated Creatinine Clearance: 14.6 mL/min (A) (by C-G formula based on SCr of 4.55 mg/dL (H)). Liver Function Tests: Recent Labs  Lab 08/09/18 1438 08/11/18 0400  AST 20  --   ALT 9  --   ALKPHOS 54  --   BILITOT 0.5  --   PROT 5.7*  --   ALBUMIN 2.4* 2.2*   No results for input(s): LIPASE, AMYLASE in the last 168 hours. No results for input(s): AMMONIA in the last 168 hours. Coagulation Profile: Recent Labs  Lab 08/09/18 1438  INR 1.03   Cardiac Enzymes: No results for input(s): CKTOTAL, CKMB, CKMBINDEX, TROPONINI in the last 168 hours. BNP  (last 3 results) No results for input(s): PROBNP in the last 8760 hours. HbA1C: No results for input(s): HGBA1C in the last 72 hours. CBG: No results for input(s): GLUCAP in the last 168 hours. Lipid Profile: No results for input(s): CHOL, HDL, LDLCALC, TRIG, CHOLHDL, LDLDIRECT in the last 72 hours. Thyroid Function Tests: No results for input(s): TSH, T4TOTAL, FREET4, T3FREE, THYROIDAB in the last 72 hours. Anemia Panel: No results for input(s): VITAMINB12, FOLATE, FERRITIN, TIBC, IRON, RETICCTPCT in the last 72 hours. Urine analysis:    Component Value Date/Time   COLORURINE YELLOW 08/10/2018 1015   APPEARANCEUR HAZY (A) 08/10/2018 1015   LABSPEC 1.011 08/10/2018 1015   PHURINE 7.0 08/10/2018 1015   GLUCOSEU NEGATIVE 08/10/2018 1015   HGBUR LARGE (A) 08/10/2018 1015   BILIRUBINUR NEGATIVE 08/10/2018 1015   KETONESUR NEGATIVE 08/10/2018 1015   PROTEINUR 100 (A) 08/10/2018 1015  NITRITE NEGATIVE 08/10/2018 1015   LEUKOCYTESUR MODERATE (A) 08/10/2018 1015   Sepsis Labs: @LABRCNTIP (procalcitonin:4,lacticidven:4)  ) Recent Results (from the past 240 hour(s))  Culture, blood (Routine x 2)     Status: Abnormal (Preliminary result)   Collection Time: 08/09/18  2:38 PM  Result Value Ref Range Status   Specimen Description   Final    BLOOD RIGHT ANTECUBITAL Performed at [facility], [address] [place], [place] [address]    Special Requests   Final    BOTTLES DRAWN AEROBIC AND ANAEROBIC Blood Culture adequate volume Performed at [facility] [place], [place] [address]    Culture  Setup Time   Final    GRAM POSITIVE COCCI IN CHAINS IN BOTH AEROBIC AND ANAEROBIC BOTTLES Gram Stain Report Called to,Read Back By and Verified With: HEARNE,J AT 0630 BY HUFFINES,S ON 08/10/18. CRITICAL VALUE NOTED.  VALUE IS CONSISTENT WITH PREVIOUSLY REPORTED AND CALLED VALUE.    Culture ENTEROCOCCUS FAECALIS (A)  Final   Report Status PENDING  Incomplete  Culture, blood  (Routine x 2)     Status: Abnormal (Preliminary result)   Collection Time: 08/09/18  3:45 PM  Result Value Ref Range Status   Specimen Description   Final    BLOOD CENTRAL LINE Performed at [facility], [address] [place], [place] [address]    Special Requests   Final    BOTTLES DRAWN AEROBIC AND ANAEROBIC Blood Culture adequate volume Performed at [facility], [address], [place], [place] [address]    Culture  Setup Time   Final    GRAM POSITIVE COCCI Gram Stain Report Called to,Read Back By and Verified With: HEARN,J.  [id number] ON 08/10/2018 BY EVA IN BOTH AEROBIC AND ANAEROBIC BOTTLES Performed at [facility], READ BACK BY AND VERIFIED WITHJohny Chess RN [phone] 08/10/18 A BROWNING    Culture (A)  Final    ENTEROCOCCUS FAECALIS SUSCEPTIBILITIES TO FOLLOW Performed at [facility], [place] [address] [place], [place] [address]    Report Status PENDING  Incomplete  Blood Culture ID Panel (Reflexed)     Status: Abnormal   Collection Time: 08/09/18  3:45 PM  Result Value Ref Range Status   Enterococcus species DETECTED (A) NOT DETECTED Final    Comment: CRITICAL RESULT CALLED TO, READ BACK BY AND VERIFIED WITH: Johny Chess RN [phone] 08/10/18 A BROWNING    Vancomycin resistance NOT DETECTED NOT DETECTED Final   Listeria monocytogenes NOT DETECTED NOT DETECTED Final   Staphylococcus species NOT DETECTED NOT DETECTED Final   Staphylococcus aureus NOT DETECTED NOT DETECTED Final   Streptococcus species NOT DETECTED NOT DETECTED Final   Streptococcus agalactiae NOT DETECTED NOT DETECTED Final   Streptococcus pneumoniae NOT DETECTED NOT DETECTED Final   Streptococcus pyogenes NOT DETECTED NOT DETECTED Final   Acinetobacter baumannii NOT DETECTED NOT DETECTED Final   Enterobacteriaceae species NOT DETECTED NOT DETECTED Final   Enterobacter cloacae complex NOT DETECTED NOT DETECTED Final   Escherichia coli NOT DETECTED NOT DETECTED Final   Klebsiella  oxytoca NOT DETECTED NOT DETECTED Final   Klebsiella pneumoniae NOT DETECTED NOT DETECTED Final   Proteus species NOT DETECTED NOT DETECTED Final   Serratia marcescens NOT DETECTED NOT DETECTED Final   Haemophilus influenzae NOT DETECTED NOT DETECTED Final   Neisseria meningitidis NOT DETECTED NOT DETECTED Final   Pseudomonas aeruginosa NOT DETECTED NOT DETECTED Final   Candida albicans NOT DETECTED NOT DETECTED Final  Candida glabrata NOT DETECTED NOT DETECTED Final   Candida krusei NOT DETECTED NOT DETECTED Final   Candida parapsilosis NOT DETECTED NOT DETECTED Final   Candida tropicalis NOT DETECTED NOT DETECTED Final    Comment: Performed at [facility], [place] [address], [place], [place] [address]         Radiology Studies: US Renal  Result Date: 08/10/2018 CLINICAL DATA:  72 year old male with acute on chronic renal failure. Bilateral nephrostomy tubes. TURP. Bladder cancer. Subsequent encounter. EXAM: RENAL / URINARY TRACT ULTRASOUND COMPLETE COMPARISON:  08/03/2018 and 07/25/2018. FINDINGS: Right Kidney: Length: 9.2 cm. Increased echogenicity with renal parenchymal thinning. No hydronephrosis. Nephrostomy tube in place. Left Kidney: Length: 10.6 cm. Echogenicity within normal limits. No hydronephrosis. Nephrostomies tube in place. Bladder: Decompressed by catheter.  Bladder wall thickening. IMPRESSION: 1. No hydronephrosis.  Nephrostomy tubes in place. 2. Increased echogenicity and renal parenchymal thinning of the right kidney. 3. Decompressed urinary bladder with catheter in place. Bladder wall thickening. Please see 07/25/2018 CT report. Electronically Signed   By: Genia Del M.D.   On: 08/10/2018 17:54      Scheduled Meds: . sodium chloride   Intravenous Once  . chlorhexidine  15 mL Mouth/Throat QID  . citalopram  20 mg Oral Daily  . Influenza vac split quadrivalent PF  0.5 mL Intramuscular Tomorrow-1000  . ipratropium-albuterol  3 mL Nebulization TID  . nicotine   14 mg Transdermal Daily  . sodium chloride flush  10-40 mL Intracatheter Q12H  . sodium chloride flush  3 mL Intravenous Q12H  . sucralfate  1 g Oral TID WC & HS   Continuous Infusions: . sodium chloride Stopped (08/10/18 1513)  . sodium chloride 125 mL/hr at 08/11/18 1503  . ampicillin (OMNIPEN) IV Stopped (08/11/18 1129)     LOS: 1 day   Roxan Hockey, MD [facility] If 7PM-7AM, please contact night-coverage [email or web address] Password [facility] 08/11/2018, 6:55 PM

## 2018-08-11 NOTE — Progress Notes (Signed)
      INFECTIOUS DISEASE ATTENDING ADDENDUM:   Date: 08/11/2018  Patient name: Carl Murphy  Medical record number: [id number]  Date of birth: 24-Sep-1946   This is a patient with enterococcus faecalis bacteremia presumably ampicillin sensitive organism.  Unfortunately he has a Port-A-Cath in place and this needs to be removed to facilitate clearance of his bacteremia.  Source has been presumed to be urinary  I would treat this patient with at least 2 weeks of parenteral antibiotics after his Port-A-Cath has been removed and after we have ensure that he is cleared his bacteremia.  Dr. Baxter Flattery will be covering for phone consultation re this pt tomorrow and Friday.  Case discussed with Dr. Denton Brick.     Alcide Evener 08/11/2018, 4:44 PM

## 2018-08-12 ENCOUNTER — Other Ambulatory Visit: Payer: Self-pay | Admitting: Urology

## 2018-08-12 ENCOUNTER — Encounter (HOSPITAL_COMMUNITY)
Admission: EM | Disposition: A | Discharge status: Hospice | Payer: Self-pay | Source: Home / Self Care | Attending: Urology

## 2018-08-12 DIAGNOSIS — D62 Acute posthemorrhagic anemia: Secondary | ICD-10-CM | POA: Diagnosis not present

## 2018-08-12 LAB — BASIC METABOLIC PANEL
Anion gap: 8 (ref 5–15)
BUN: 28 mg/dL — ABNORMAL HIGH (ref 8–23)
CALCIUM: 6.9 mg/dL — AB (ref 8.9–10.3)
CO2: 17 mmol/L — ABNORMAL LOW (ref 22–32)
CREATININE: 3.95 mg/dL — AB (ref 0.61–1.24)
Chloride: 115 mmol/L — ABNORMAL HIGH (ref 98–111)
GFR, EST AFRICAN AMERICAN: 16 mL/min — AB (ref >60)
GFR, EST NON AFRICAN AMERICAN: 14 mL/min — AB (ref >60)
Glucose, Bld: 68 mg/dL — ABNORMAL LOW (ref 70–99)
Potassium: 4.6 mmol/L (ref 3.5–5.1)
SODIUM: 140 mmol/L (ref 135–145)

## 2018-08-12 LAB — BLOOD CULTURE ID PANEL (REFLEXED)
Acinetobacter baumannii: NOT DETECTED
CARBAPENEM RESISTANCE: NOT DETECTED
Candida albicans: NOT DETECTED
Candida glabrata: NOT DETECTED
Candida krusei: NOT DETECTED
Candida parapsilosis: NOT DETECTED
Candida tropicalis: NOT DETECTED
ENTEROBACTERIACEAE SPECIES: NOT DETECTED
ENTEROCOCCUS SPECIES: NOT DETECTED
Enterobacter cloacae complex: NOT DETECTED
Escherichia coli: NOT DETECTED
Haemophilus influenzae: NOT DETECTED
Klebsiella oxytoca: NOT DETECTED
Klebsiella pneumoniae: NOT DETECTED
Listeria monocytogenes: NOT DETECTED
NEISSERIA MENINGITIDIS: NOT DETECTED
Proteus species: NOT DETECTED
Pseudomonas aeruginosa: DETECTED — AB
STAPHYLOCOCCUS AUREUS BCID: NOT DETECTED
STREPTOCOCCUS AGALACTIAE: NOT DETECTED
STREPTOCOCCUS PYOGENES: NOT DETECTED
Serratia marcescens: NOT DETECTED
Staphylococcus species: NOT DETECTED
Streptococcus pneumoniae: NOT DETECTED
Streptococcus species: NOT DETECTED

## 2018-08-12 LAB — CULTURE, BLOOD (ROUTINE X 2)
SPECIAL REQUESTS: ADEQUATE
Special Requests: ADEQUATE

## 2018-08-12 LAB — CBC
HEMATOCRIT: 27 % — AB (ref 39.0–52.0)
Hemoglobin: 8.7 g/dL — ABNORMAL LOW (ref 13.0–17.0)
MCH: 29.9 pg (ref 26.0–34.0)
MCHC: 32.2 g/dL (ref 30.0–36.0)
MCV: 92.8 fL (ref 78.0–100.0)
PLATELETS: 129 10*3/uL — AB (ref 150–400)
RBC: 2.91 MIL/uL — ABNORMAL LOW (ref 4.22–5.81)
RDW: 17.7 % — AB (ref 11.5–15.5)
WBC: 5.5 10*3/uL (ref 4.0–10.5)

## 2018-08-12 LAB — URINE CULTURE: SPECIAL REQUESTS: NORMAL

## 2018-08-12 SURGERY — REMOVAL PORT-A-CATH
Anesthesia: LOCAL

## 2018-08-12 MED ORDER — CARVEDILOL 3.125 MG PO TABS
3.1250 mg | ORAL_TABLET | Freq: Two times a day (BID) | ORAL | Status: DC
  Administered 2018-08-12 – 2018-08-13 (×2): 3.125 mg via ORAL
  Filled 2018-08-12 (×2): qty 1

## 2018-08-12 MED ORDER — CHLORHEXIDINE GLUCONATE CLOTH 2 % EX PADS: 6.0000 | MEDICATED_PAD | Freq: Once | CUTANEOUS | Status: DC

## 2018-08-12 MED ORDER — MAGIC MOUTHWASH
10.0000 mL | Freq: Four times a day (QID) | ORAL | Status: DC | PRN
  Filled 2018-08-12: qty 10

## 2018-08-12 MED ORDER — PIPERACILLIN-TAZOBACTAM 3.375 G IVPB
3.3750 g | Freq: Two times a day (BID) | INTRAVENOUS | Status: DC
  Administered 2018-08-12 – 2018-08-13 (×2): 3.375 g via INTRAVENOUS
  Filled 2018-08-12 (×2): qty 50

## 2018-08-12 NOTE — Progress Notes (Signed)
CRITICAL VALUE ALERT  Critical Value:  Gram negative rods in aerobic bottle   Date & Time Notied:  08/12/18 @ 1110  Provider Notified: Dr. Denton Brick  Orders Received/Actions taken: no new orders at this time

## 2018-08-12 NOTE — Progress Notes (Addendum)
Subjective: Interval History: Patient complains of poor appetite but no nausea or vomiting.  Patient also has some difficulty breathing on and off but no significant change since he came to the hospital.  His family said states that he is somewhat alert and better as compared to when he was at home.  Objective: Vital signs in last 24 hours: Temp:  [97.3 F (36.3 C)-98.2 F (36.8 C)] 97.3 F (36.3 C) (09/12 0819) Pulse Rate:  [53-66] 58 (09/11 2100) Resp:  [12-19] 13 (09/11 2100) BP: (104-165)/(74-118) 104/89 (09/11 2100) SpO2:  [86 %-95 %] 93 % (09/12 0751) Weight:  [75.8 kg] 75.8 kg (09/12 0500) Weight change: 0.9 kg  Intake/Output from previous day: 09/11 0701 - 09/12 0700 In: 24104.3 [I.V.:4254.3; IV Piggyback:500] Out: 16109 [Urine:19075] Intake/Output this shift: Total I/O In: 3000 [Other:3000] Out: 2950 [Urine:2950]  General appearance: alert, cooperative and no distress Resp: clear to auscultation bilaterally Cardio: regular rate and rhythm Extremities: No edema  Lab Results: Recent Labs    08/10/18 0716 08/11/18 0400  WBC 4.2 5.6  HGB 9.5* 9.8*  HCT 28.3* 29.3*  PLT 116* 144*   BMET:  Recent Labs    08/10/18 0716 08/11/18 0400  NA 136 139  K 4.8 4.8  CL 112* 115*  CO2 18* 19*  GLUCOSE 81 84  BUN 39* 35*  CREATININE 4.91* 4.55*  CALCIUM 6.7* 7.0*   No results for input(s): PTH in the last 72 hours. Iron Studies: No results for input(s): IRON, TIBC, TRANSFERRIN, FERRITIN in the last 72 hours.  Studies/Results: US Renal  Result Date: 08/10/2018 CLINICAL DATA:  72 year old male with acute on chronic renal failure. Bilateral nephrostomy tubes. TURP. Bladder cancer. Subsequent encounter. EXAM: RENAL / URINARY TRACT ULTRASOUND COMPLETE COMPARISON:  08/03/2018 and 07/25/2018. FINDINGS: Right Kidney: Length: 9.2 cm. Increased echogenicity with renal parenchymal thinning. No hydronephrosis. Nephrostomy tube in place. Left Kidney: Length: 10.6 cm. Echogenicity  within normal limits. No hydronephrosis. Nephrostomies tube in place. Bladder: Decompressed by catheter.  Bladder wall thickening. IMPRESSION: 1. No hydronephrosis.  Nephrostomy tubes in place. 2. Increased echogenicity and renal parenchymal thinning of the right kidney. 3. Decompressed urinary bladder with catheter in place. Bladder wall thickening. Please see 07/25/2018 CT report. Electronically Signed   By: Genia Del M.D.   On: 08/10/2018 17:54    I have reviewed the patient's current medications.  Assessment/Plan: Renal failure: Acute on chronic.  Possibly a combination of prerenal syndrome/ATN and obstructive uropathy.  Presently patient does not have any hydronephrosis.  He has bilateral subcutaneous nephrostomy tube placed.  His creatinine is coming down.  His appetite remains poor but no nausea or vomiting. 2] hypertension: His blood pressure is reasonably controlled 3] anemia: His hemoglobin is below target goal 4] history of bladder cancer.  Presently being followed by urology. 5] history of sepsis. Enterococcus bacteremia.  Presently on antibiotics.  Patient is a febrile with normal white blood cell count. 6] history of COPD 7] fluid management: No sign of fluid overload. Plan: 1] patient overall poor candidate for dialysis.  I have discussed with the family's.  At this morning patient does not requiring dialysis. 2] we will continue with present management 3] we will check his renal panel in the morning   LOS: 2 days   [facility] S 08/12/2018,9:31 AM

## 2018-08-12 NOTE — Progress Notes (Signed)
Report given to Anderson Malta, RN at [facility] on 4W.

## 2018-08-12 NOTE — Progress Notes (Signed)
Pharmacy Antibiotic Note  Carl Murphy is a 72 y.o. male admitted on 08/09/2018 with blood cultures positive for  Enterococcus.  Pharmacy has been consulted for Zosyn dosing.  Plan: Start Zosyn 3.375g IV q12h (extended infusion) Pharmacy will monitor patient's clinical course, culture results and labs.  Height: 5\' 8"  (172.7 cm) Weight: 167 lb 1.7 oz (75.8 kg) IBW/kg (Calculated) : 68.4  Temp (24hrs), Avg:97.6 F (36.4 C), Min:97.3 F (36.3 C), Max:98.2 F (36.8 C)  Recent Labs  Lab 08/09/18 1220 08/09/18 1438 08/09/18 1545 08/10/18 0716 08/11/18 0400 08/12/18 1048  WBC 4.5 4.7  --  4.2 5.6 5.5  CREATININE  --  5.11*  --  4.91* 4.55* 3.95*  LATICACIDVEN  --   --  1.6  --   --   --     Estimated Creatinine Clearance: 16.8 mL/min (A) (by C-G formula based on SCr of 3.95 mg/dL (H)).    Allergies  Allergen Reactions  . Carboplatin Shortness Of Breath    Reaction occurred after patient left [facility]. Pt reported extreme sob after leaving [facility] that eventually resolved on its own.    Antimicrobials this admission: Ampicillin 9/10>>9/12 Zosyn 9/12 >>    Dose adjustments this admission: Zosyn  Microbiology results: 9/9 BCx2: E. Faecalis 9/11 BCx2: 1 of 2= GNR 9/9 UCx2: multiple species present 9/11 BCID: pending Sputum:   MRSA PCR  Thank you for allowing pharmacy to be a part of this patient's care.  Despina Pole 08/12/2018 4:31 PM

## 2018-08-12 NOTE — Progress Notes (Signed)
PROGRESS NOTE    Carl Murphy  TDV:[id number] DOB: 06-Dec-1945 DOA: 08/09/2018 PCP: [name], MD   Principal Problem:   Acute blood loss anemia Active Problems:   Malignant neoplasm of trigone of bladder (HCC)   Gross hematuria   COPD (chronic obstructive pulmonary disease) (HCC)   Tobacco dependence   Hematuria   Bilateral hydronephrosis   Acute renal failure superimposed on stage 4 chronic kidney disease (HCC)   Nephrostomy status ([place]), bilateral   Enterococcal bacteremia   Malnutrition of moderate degree  Brief Narrative:  72 year old man admitted from home on 9/9 due to hematuria, fatigue and weakness.  He has a history of bladder cancer with bilateral nephrostomy tubes placed in June of this year.  He was just recently started on chemotherapy and got a dose recently but became too weak to continue the drug.  Upon arrival he was found to have a hemoglobin of 3.7 and admission was requested.  Subsequently on 08/10/18 blood cultures have resulted positive for enterococcus. Repeat Blood Cx from 08/11/18 with gram-negative rods in anaerobic bottle   Assessment & Plan:    Acute blood loss anemia -Secondary to bladder cancer with hematuria-hemoglobin is down to 8.7 from 9.8, cystoscopy with fulguration/palliative resection planned for  08/13/2018 at [facility] by Dr. Diona Fanti -He is status post 4 units of PRBCs -  at the low hemoglobin was as low as 3.9 prior to transfusion, plan to treat with continued transfusions if needed for hemoglobin less than 7.  Enterococcal bacteremia/septicemia--  -Blood cultures from 08/09/2018  positive for enterococcus, continue IV ampicillin as per ID, repeat blood cultures from 08/11/2018 with GNR in aerobic bottle, unable to take Port-A-Cath out as patient has no other venous access at this time--discussed case via phone with ID, Dr. Drucilla Schmidt, c/n ampicillin 2 g IV every 8 hours for at least 2 weeks from the date of Port-A-Cath removal ,  transthoracic echo without vegetations  -Suspect source to be urinary, especially given his bilateral nephrostomy tubes, however urine cultures without growth.  Please call back infectious disease once identification of the gram-negative rod in the blood from 08/11/2018 is available, patient now Needs broader coverage  Hypertension -Blood pressure is soft but stable, okay to restart Coreg 3.25 mg twice daily, continue to hold amlodipine   COPD -Compensated, at baseline  Acute on chronic kidney disease stage IV-V -Baseline creatinine appears to be around 4-4.8, creatinine was 5.1 and admission, creatinine is now down to 3.95,  -Suspect a combination of ATN from severe anemia plus obstructive issues with bladder cancer. -Continue to maintain adequate hydration, avoid hypotension avoid nephrotoxic agents  DVT prophylaxis: SCDs Code Status: Limited code Family Communication:  Wife  at bedside updated on plan of care and all questions answered Disposition Plan:  transfer to [facility] on 08/12/2018 for further urological intervention being planned for 08/13/2018.by Dr Diona Fanti  Consultants:   Urology   ID consultation via phone  Procedures:   TTE Echo w/o vegetations  Antimicrobials:  Anti-infectives (From admission, onward)   Start     Dose/Rate Route Frequency Ordered Stop   08/10/18 1200  ampicillin (OMNIPEN) 2 g in sodium chloride 0.9 % 100 mL IVPB     2 g 300 mL/hr over 20 Minutes Intravenous Every 8 hours 08/10/18 1049     08/10/18 0830  ampicillin (OMNIPEN) 2 g in sodium chloride 0.9 % 100 mL IVPB  Status:  Discontinued     2 g 300 mL/hr over  20 Minutes Intravenous Every 6 hours 08/10/18 0819 08/10/18 1049       Subjective:  Wife at bedside, no fever no chills, no vomiting no diarrhea, appetite remains poor  Objective: Vitals:   08/12/18 1100 08/12/18 1126 08/12/18 1146 08/12/18 1418  BP: (!) 135/47     Pulse: 68 65    Resp: 17 18    Temp:   (!) 97.5 F  (36.4 C)   TempSrc:   Axillary   SpO2: 93% (!) 89%  95%  Weight:      Height:        Intake/Output Summary (Last 24 hours) at 08/12/2018 1506 Last data filed at 08/12/2018 1300 Gross per 24 hour  Intake 20872.64 ml  Output 23025 ml  Net -2152.36 ml   Filed Weights   08/10/18 0434 08/11/18 0400 08/12/18 0500  Weight: 72.3 kg 74.9 kg 75.8 kg    Examination:  Patient is examined daily including today on 08/12/18  , exams remain the same as of yesterday except that has changed    General exam: Alert, awake, oriented x 3, resting after pain meds Neck-Port-A-Cath site is clean dry and intact Nose- [place] 1L/min Respiratory system: Diminished in bases, no wheezing  cardiovascular system:RRR. No murmurs, rubs, gallops. Gastrointestinal system: Abdomen is nondistended, soft and nontender.  Normal bowel sounds heard. Central nervous system: Alert and oriented. No focal neurological deficits. Extremities: No C/C/E, +pedal pulses Skin: No rashes, lesions or ulcers Psychiatry: Judgement and insight appear normal. Mood & affect appropriate.  GU-three-way bladder irrigation progress, urine less bloody   CBC: Recent Labs  Lab 08/09/18 1220 08/09/18 1438 08/10/18 0716 08/11/18 0400 08/12/18 1048  WBC 4.5 4.7 4.2 5.6 5.5  NEUTROABS 3.6 3.8  --   --   --   HGB 4.3* 3.9* 9.5* 9.8* 8.7*  HCT 13.2* 12.2* 28.3* 29.3* 27.0*  MCV 96.4 96.1 91.3 90.7 92.8  PLT 182 167 116* 144* 332*   Basic Metabolic Panel: Recent Labs  Lab 08/09/18 1438 08/10/18 0716 08/11/18 0400 08/12/18 1048  NA 136 136 139 140  K 4.8 4.8 4.8 4.6  CL 112* 112* 115* 115*  CO2 17* 18* 19* 17*  GLUCOSE 104* 81 84 68*  BUN 44* 39* 35* 28*  CREATININE 5.11* 4.91* 4.55* 3.95*  CALCIUM 7.0* 6.7* 7.0* 6.9*  PHOS  --   --  3.8  --    GFR: Estimated Creatinine Clearance: 16.8 mL/min (A) (by C-G formula based on SCr of 3.95 mg/dL (H)). Liver Function Tests: Recent Labs  Lab 08/09/18 1438 08/11/18 0400  AST 20  --    ALT 9  --   ALKPHOS 54  --   BILITOT 0.5  --   PROT 5.7*  --   ALBUMIN 2.4* 2.2*   No results for input(s): LIPASE, AMYLASE in the last 168 hours. No results for input(s): AMMONIA in the last 168 hours. Coagulation Profile: Recent Labs  Lab 08/09/18 1438  INR 1.03   Cardiac Enzymes: No results for input(s): CKTOTAL, CKMB, CKMBINDEX, TROPONINI in the last 168 hours. BNP (last 3 results) No results for input(s): PROBNP in the last 8760 hours. HbA1C: No results for input(s): HGBA1C in the last 72 hours. CBG: No results for input(s): GLUCAP in the last 168 hours. Lipid Profile: No results for input(s): CHOL, HDL, LDLCALC, TRIG, CHOLHDL, LDLDIRECT in the last 72 hours. Thyroid Function Tests: No results for input(s): TSH, T4TOTAL, FREET4, T3FREE, THYROIDAB in the last 72 hours. Anemia Panel: No  results for input(s): VITAMINB12, FOLATE, FERRITIN, TIBC, IRON, RETICCTPCT in the last 72 hours. Urine analysis:    Component Value Date/Time   COLORURINE YELLOW 08/10/2018 1015   APPEARANCEUR HAZY (A) 08/10/2018 1015   LABSPEC 1.011 08/10/2018 1015   PHURINE 7.0 08/10/2018 1015   GLUCOSEU NEGATIVE 08/10/2018 1015   HGBUR LARGE (A) 08/10/2018 1015   BILIRUBINUR NEGATIVE 08/10/2018 1015   KETONESUR NEGATIVE 08/10/2018 1015   PROTEINUR 100 (A) 08/10/2018 1015   NITRITE NEGATIVE 08/10/2018 1015   LEUKOCYTESUR MODERATE (A) 08/10/2018 1015   Sepsis Labs: @LABRCNTIP (procalcitonin:4,lacticidven:4)  ) Recent Results (from the past 240 hour(s))  Culture, blood (Routine x 2)     Status: Abnormal   Collection Time: 08/09/18  2:38 PM  Result Value Ref Range Status   Specimen Description   Final    BLOOD RIGHT ANTECUBITAL Performed at [facility], [address], [place], [place] [address]    Special Requests   Final    BOTTLES DRAWN AEROBIC AND ANAEROBIC Blood Culture adequate volume Performed at [facility], [address] [place], [place] [address]    Culture  Setup Time   Final     GRAM POSITIVE COCCI IN CHAINS IN BOTH AEROBIC AND ANAEROBIC BOTTLES Gram Stain Report Called to,Read Back By and Verified With: HEARNE,J AT 0630 BY HUFFINES,S ON 08/10/18. CRITICAL VALUE NOTED.  VALUE IS CONSISTENT WITH PREVIOUSLY REPORTED AND CALLED VALUE.    Culture (A)  Final    ENTEROCOCCUS FAECALIS SUSCEPTIBILITIES PERFORMED ON PREVIOUS CULTURE WITHIN THE LAST 5 DAYS. Performed at [facility], [place] [address], [place], [place] [address]    Report Status 08/12/2018 FINAL  Final  Culture, blood (Routine x 2)     Status: Abnormal   Collection Time: 08/09/18  3:45 PM  Result Value Ref Range Status   Specimen Description   Final    BLOOD CENTRAL LINE Performed at [facility], [address] [place], [place] [address]    Special Requests   Final    BOTTLES DRAWN AEROBIC AND ANAEROBIC Blood Culture adequate volume Performed at [facility], [address], [place],  [address]    Culture  Setup Time   Final    GRAM POSITIVE COCCI Gram Stain Report Called to,Read Back By and Verified With: HEARN,J.  [id number] ON 08/10/2018 BY EVA IN BOTH AEROBIC AND ANAEROBIC BOTTLES Performed at [place], READ BACK BY AND VERIFIED WITH: Johny Chess RN [id number] 08/10/18 A BROWNING    Culture ENTEROCOCCUS FAECALIS (A)  Final   Report Status 08/12/2018 FINAL  Final   Organism ID, Bacteria ENTEROCOCCUS FAECALIS  Final      Susceptibility   Enterococcus faecalis - MIC*    AMPICILLIN <=2 SENSITIVE Sensitive     VANCOMYCIN 1 SENSITIVE Sensitive     GENTAMICIN SYNERGY SENSITIVE Sensitive     * ENTEROCOCCUS FAECALIS  Blood Culture ID Panel (Reflexed)     Status: Abnormal   Collection Time: 08/09/18  3:45 PM  Result Value Ref Range Status   Enterococcus species DETECTED (A) NOT DETECTED Final    Comment: CRITICAL RESULT CALLED TO, READ BACK BY AND VERIFIED WITH: L HYLTON RN [phone] 08/10/18 A BROWNING    Vancomycin resistance NOT DETECTED NOT DETECTED Final    Listeria monocytogenes NOT DETECTED NOT DETECTED Final   Staphylococcus species NOT DETECTED NOT DETECTED Final   Staphylococcus aureus NOT DETECTED NOT DETECTED Final   Streptococcus species NOT DETECTED NOT DETECTED Final  Streptococcus agalactiae NOT DETECTED NOT DETECTED Final   Streptococcus pneumoniae NOT DETECTED NOT DETECTED Final   Streptococcus pyogenes NOT DETECTED NOT DETECTED Final   Acinetobacter baumannii NOT DETECTED NOT DETECTED Final   Enterobacteriaceae species NOT DETECTED NOT DETECTED Final   Enterobacter cloacae complex NOT DETECTED NOT DETECTED Final   Escherichia coli NOT DETECTED NOT DETECTED Final   Klebsiella oxytoca NOT DETECTED NOT DETECTED Final   Klebsiella pneumoniae NOT DETECTED NOT DETECTED Final   Proteus species NOT DETECTED NOT DETECTED Final   Serratia marcescens NOT DETECTED NOT DETECTED Final   Haemophilus influenzae NOT DETECTED NOT DETECTED Final   Neisseria meningitidis NOT DETECTED NOT DETECTED Final   Pseudomonas aeruginosa NOT DETECTED NOT DETECTED Final   Candida albicans NOT DETECTED NOT DETECTED Final   Candida glabrata NOT DETECTED NOT DETECTED Final   Candida krusei NOT DETECTED NOT DETECTED Final   Candida parapsilosis NOT DETECTED NOT DETECTED Final   Candida tropicalis NOT DETECTED NOT DETECTED Final    Comment: Performed at [facility], [place] [address], [place], [place] [address]  Culture, Urine     Status: Abnormal   Collection Time: 08/10/18  3:48 AM  Result Value Ref Range Status   Specimen Description   Final    URINE, CLEAN CATCH Performed at [facility], [address], [place], [place] [address]    Special Requests   Final    Normal Performed at [facility], [address], [place], [place] [address]    Culture MULTIPLE SPECIES PRESENT, SUGGEST RECOLLECTION (A)  Final   Report Status 08/12/2018 FINAL  Final  Culture, Urine     Status: Abnormal   Collection Time: 08/10/18 10:15 AM  Result Value Ref Range Status    Specimen Description   Final    URINE, CATHETERIZED Performed at [facility], [address], [place], [place] [address]    Special Requests   Final    NONE Performed at [facility], [address], [place], [place] [address]    Culture MULTIPLE SPECIES PRESENT, SUGGEST RECOLLECTION (A)  Final   Report Status 08/12/2018 FINAL  Final  Culture, blood (Routine X 2) w Reflex to ID Panel     Status: None (Preliminary result)   Collection Time: 08/11/18  2:07 PM  Result Value Ref Range Status   Specimen Description BLOOD LEFT ANTECUBITAL  Final   Special Requests   Final    BOTTLES DRAWN AEROBIC AND ANAEROBIC Blood Culture results may not be optimal due to an excessive volume of blood received in culture bottles   Culture   Final    NO GROWTH < 24 HOURS Performed at [facility], [address] [place], [place] [address]    Report Status PENDING  Incomplete  Culture, blood (Routine X 2) w Reflex to ID Panel     Status: None (Preliminary result)   Collection Time: 08/11/18  2:07 PM  Result Value Ref Range Status   Specimen Description BLOOD RIGHT ANTECUBITAL  Final   Special Requests   Final    BOTTLES DRAWN AEROBIC AND ANAEROBIC Blood Culture adequate volume   Culture  Setup Time   Final    GRAM NEGATIVE RODS RECOVERED FROM THE AEROBIC BOTTLE Gram Stain Report Called to,Read Back By and Verified With: SMITH,J. AT 7673 ON 08/12/2018 BY BAUGHAM,M. Performed at [facility] Performed at [facility], [address] [place], [place] [address]    Culture PENDING  Incomplete   Report Status PENDING  Incomplete      Radiology Studies: US Renal  Result Date: 08/10/2018 CLINICAL DATA:  72 year old male with acute on chronic renal failure. Bilateral nephrostomy tubes. TURP. Bladder cancer. Subsequent encounter. EXAM: RENAL / URINARY TRACT ULTRASOUND COMPLETE COMPARISON:  08/03/2018 and 07/25/2018. FINDINGS: Right Kidney: Length: 9.2 cm. Increased echogenicity with renal parenchymal  thinning. No hydronephrosis. Nephrostomy tube in place. Left Kidney: Length: 10.6 cm. Echogenicity within normal limits. No hydronephrosis. Nephrostomies tube in place. Bladder: Decompressed by catheter.  Bladder wall thickening. IMPRESSION: 1. No hydronephrosis.  Nephrostomy tubes in place. 2. Increased echogenicity and renal parenchymal thinning of the right kidney. 3. Decompressed urinary bladder with catheter in place. Bladder wall thickening. Please see 07/25/2018 CT report. Electronically Signed   By: Genia Del M.D.   On: 08/10/2018 17:54   Scheduled Meds: . sodium chloride   Intravenous Once  . chlorhexidine  15 mL Mouth/Throat QID  . citalopram  20 mg Oral Daily  . Influenza vac split quadrivalent PF  0.5 mL Intramuscular Tomorrow-1000  . ipratropium-albuterol  3 mL Nebulization TID  . nicotine  14 mg Transdermal Daily  . sodium chloride flush  10-40 mL Intracatheter Q12H  . sodium chloride flush  3 mL Intravenous Q12H  . sucralfate  1 g Oral TID WC & HS   Continuous Infusions: . sodium chloride Stopped (08/10/18 1513)  . sodium chloride 125 mL/hr at 08/12/18 1252  . ampicillin (OMNIPEN) IV Stopped (08/12/18 1202)     LOS: 2 days   Roxan Hockey, MD [facility] If 7PM-7AM, please contact night-coverage [email or web address] Password [facility] 08/12/2018, 3:06 PM

## 2018-08-12 NOTE — Progress Notes (Addendum)
On reassessment, blood noted to be back-flowing into CBI tubing. Dr. Denton Brick paged with verbal orders to page urology and verbal orders to d/c Ampicillin and start Zosyn to be dosed by pharmacy.  Spoke with Dr. Alyson Ingles verbal order given to irrigate foley.  1620: Bladder manually irrigated. Multiple blood clots noted. CBI running smoothing without complications after irrigation. Pt c/o of bladder spasms. Will give pt pain medicine.

## 2018-08-12 NOTE — Progress Notes (Signed)
Pt with [facility] team. Pt belongings sent with daughter.

## 2018-08-12 NOTE — Progress Notes (Signed)
Report given to [facility]. ETA [id number]

## 2018-08-13 ENCOUNTER — Encounter (HOSPITAL_COMMUNITY): Payer: Self-pay

## 2018-08-13 ENCOUNTER — Encounter (HOSPITAL_COMMUNITY)
Admission: EM | Disposition: A | Discharge status: Hospice | Payer: Self-pay | Source: Home / Self Care | Attending: Urology

## 2018-08-13 ENCOUNTER — Inpatient Hospital Stay (HOSPITAL_COMMUNITY): Payer: Medicare Other | Admitting: Certified Registered"

## 2018-08-13 ENCOUNTER — Inpatient Hospital Stay (HOSPITAL_COMMUNITY): Admission: RE | Admit: 2018-08-13 | Payer: Medicare Other | Source: Ambulatory Visit | Admitting: Urology

## 2018-08-13 DIAGNOSIS — B965 Pseudomonas (aeruginosa) (mallei) (pseudomallei) as the cause of diseases classified elsewhere: Secondary | ICD-10-CM

## 2018-08-13 DIAGNOSIS — R7881 Bacteremia: Secondary | ICD-10-CM | POA: Diagnosis present

## 2018-08-13 DIAGNOSIS — C679 Malignant neoplasm of bladder, unspecified: Secondary | ICD-10-CM | POA: Diagnosis present

## 2018-08-13 DIAGNOSIS — B952 Enterococcus as the cause of diseases classified elsewhere: Secondary | ICD-10-CM

## 2018-08-13 DIAGNOSIS — Z95828 Presence of other vascular implants and grafts: Secondary | ICD-10-CM | POA: Diagnosis not present

## 2018-08-13 DIAGNOSIS — Z888 Allergy status to other drugs, medicaments and biological substances status: Secondary | ICD-10-CM

## 2018-08-13 HISTORY — PX: TRANSURETHRAL RESECTION OF BLADDER TUMOR: SHX2575

## 2018-08-13 SURGERY — TURBT (TRANSURETHRAL RESECTION OF BLADDER TUMOR)
Anesthesia: Spinal | Site: Bladder

## 2018-08-13 MED ORDER — SODIUM CHLORIDE 0.9 % IR SOLN: 3000.0000 mL | Status: DC

## 2018-08-13 MED ORDER — ALBUMIN HUMAN 5 % IV SOLN
INTRAVENOUS | Status: DC | PRN
  Administered 2018-08-13: 15:00:00 via INTRAVENOUS

## 2018-08-13 MED ORDER — HEPARIN SOD (PORK) LOCK FLUSH 100 UNIT/ML IV SOLN
500.0000 [IU] | INTRAVENOUS | Status: DC | PRN
  Filled 2018-08-13: qty 5

## 2018-08-13 MED ORDER — PROMETHAZINE HCL 25 MG/ML IJ SOLN: 6.2500 mg | INTRAMUSCULAR | Status: DC | PRN

## 2018-08-13 MED ORDER — PIPERACILLIN-TAZOBACTAM IN DEX 2-0.25 GM/50ML IV SOLN
2.2500 g | Freq: Four times a day (QID) | INTRAVENOUS | Status: DC
  Administered 2018-08-13 – 2018-08-17 (×15): 2.25 g via INTRAVENOUS
  Filled 2018-08-13 (×20): qty 50

## 2018-08-13 MED ORDER — SODIUM CHLORIDE 0.9 % IR SOLN
Status: DC | PRN
  Administered 2018-08-13: 6000 mL

## 2018-08-13 MED ORDER — 0.9 % SODIUM CHLORIDE (POUR BTL) OPTIME
TOPICAL | Status: DC | PRN
  Administered 2018-08-13: 1000 mL

## 2018-08-13 MED ORDER — SODIUM CHLORIDE 0.9 % IV SOLN
INTRAVENOUS | Status: DC | PRN
  Administered 2018-08-13 (×2): via INTRAVENOUS

## 2018-08-13 MED ORDER — FENTANYL CITRATE (PF) 100 MCG/2ML IJ SOLN: 25.0000 ug | INTRAMUSCULAR | Status: DC | PRN

## 2018-08-13 MED ORDER — EPHEDRINE SULFATE 50 MG/ML IJ SOLN
INTRAMUSCULAR | Status: DC | PRN
  Administered 2018-08-13 (×3): 10 mg via INTRAVENOUS

## 2018-08-13 MED ORDER — MIDAZOLAM HCL 2 MG/2ML IJ SOLN: 0.5000 mg | Freq: Once | INTRAMUSCULAR | Status: DC | PRN

## 2018-08-13 MED ORDER — MEPERIDINE HCL 50 MG/ML IJ SOLN: 6.2500 mg | INTRAMUSCULAR | Status: DC | PRN

## 2018-08-13 MED ORDER — BUPIVACAINE IN DEXTROSE 0.75-8.25 % IT SOLN
INTRATHECAL | Status: DC | PRN
  Administered 2018-08-13: 13.5 mg via INTRATHECAL

## 2018-08-13 MED ORDER — STERILE WATER FOR IRRIGATION IR SOLN
Status: DC | PRN
  Administered 2018-08-13: 500 mL

## 2018-08-13 MED ORDER — SODIUM CHLORIDE 0.9 % IV SOLN
INTRAVENOUS | Status: DC | PRN
  Administered 2018-08-13: 35 ug/min via INTRAVENOUS

## 2018-08-13 SURGICAL SUPPLY — 20 items
BAG URINE DRAINAGE (UROLOGICAL SUPPLIES) ×2 IMPLANT
BAG URO CATCHER STRL LF (MISCELLANEOUS) ×2 IMPLANT
CATH HEMA 3WAY 30CC 22FR COUDE (CATHETERS) ×2 IMPLANT
ELECT REM PT RETURN 15FT ADLT (MISCELLANEOUS) ×2 IMPLANT
EVACUATOR MICROVAS BLADDER (UROLOGICAL SUPPLIES) IMPLANT
GLOVE BIO SURGEON STRL SZ 6.5 (GLOVE) ×2 IMPLANT
GLOVE BIOGEL M 8.0 STRL (GLOVE) ×2 IMPLANT
GOWN STRL REUS W/ TWL LRG LVL3 (GOWN DISPOSABLE) ×1 IMPLANT
GOWN STRL REUS W/TWL LRG LVL3 (GOWN DISPOSABLE) ×1
GOWN STRL REUS W/TWL XL LVL3 (GOWN DISPOSABLE) ×2 IMPLANT
LOOP CUT BIPOLAR 24F LRG (ELECTROSURGICAL) ×2 IMPLANT
LOOP MONOPOLAR YLW (ELECTROSURGICAL) IMPLANT
MANIFOLD NEPTUNE II (INSTRUMENTS) ×2 IMPLANT
NDL SAFETY ECLIPSE 18X1.5 (NEEDLE) ×1 IMPLANT
NEEDLE HYPO 18GX1.5 SHARP (NEEDLE) ×1
PACK CYSTO (CUSTOM PROCEDURE TRAY) ×2 IMPLANT
SET IRRIG Y TYPE TUR BLADDER L (SET/KITS/TRAYS/PACK) ×2 IMPLANT
SYRINGE IRR TOOMEY STRL 70CC (SYRINGE) ×2 IMPLANT
TUBING CONNECTING 10 (TUBING) ×2 IMPLANT
TUBING UROLOGY SET (TUBING) ×2 IMPLANT

## 2018-08-13 NOTE — Anesthesia Procedure Notes (Signed)
Spinal  Patient location during procedure: OR End time: 08/13/2018 2:55 PM Staffing Anesthesiologist: Annye Asa, MD Performed: anesthesiologist  Preanesthetic Checklist Completed: patient identified, surgical consent, pre-op evaluation, timeout performed, IV checked, risks and benefits discussed and monitors and equipment checked Spinal Block Patient position: sitting Prep: site prepped and draped and DuraPrep Patient monitoring: blood pressure, continuous pulse ox, cardiac monitor and heart rate Approach: midline Location: L3-4 Injection technique: single-shot Needle Needle type: Quincke  Needle gauge: 25 G Needle length: 9 cm Additional Notes Pt identified in Operating room.  Monitors applied. Working IV access confirmed. Sterile prep, drape lumbar spine.  1% lido local L 3,4.  #25ga Quincke into clear CSF L 3,4.  13.5 mg 0.75% Bupivacaine with dextrose injected with asp CSF beginning and end of injection.  Patient asymptomatic, VSS, no heme aspirated, tolerated well.  Jenita Seashore, MD

## 2018-08-13 NOTE — Anesthesia Procedure Notes (Signed)
Procedure Name: MAC Date/Time: 08/13/2018 2:40 PM Performed by: Lissa Morales, CRNA Pre-anesthesia Checklist: Patient identified, Emergency Drugs available, Suction available, Patient being monitored and Timeout performed Patient Re-evaluated:Patient Re-evaluated prior to induction Oxygen Delivery Method: Simple face mask Placement Confirmation: positive ETCO2

## 2018-08-13 NOTE — Anesthesia Preprocedure Evaluation (Addendum)
Anesthesia Evaluation  Patient identified by MRN, date of birth, ID band Patient confused    Reviewed: Allergy & Precautions, NPO status , Patient's Chart, lab work & pertinent test results, reviewed documented beta blocker date and time   History of Anesthesia Complications (+) PONV  Airway Mallampati: II  TM Distance: >3 FB Neck ROM: Full    Dental  (+) Poor Dentition, Missing, Dental Advisory Given, Chipped   Pulmonary shortness of breath, COPD,  COPD inhaler, Current Smoker,    + rhonchi        Cardiovascular hypertension, Pt. on medications and Pt. on home beta blockers  Rhythm:Regular Rate:Normal  08/10/18 ECHO: EF 50-55%, valves OK   Neuro/Psych Depression negative neurological ROS     GI/Hepatic negative GI ROS, Neg liver ROS,   Endo/Other  negative endocrine ROS  Renal/GU Renal InsufficiencyRenal disease (creat 3.95)   Bladder cancer    Musculoskeletal  (+) Arthritis ,   Abdominal   Peds  Hematology  (+) Blood dyscrasia (Hb 8.7, plt 129k), anemia ,   Anesthesia Other Findings   Reproductive/Obstetrics                            Anesthesia Physical Anesthesia Plan  ASA: IV  Anesthesia Plan: Spinal   Post-op Pain Management:    Induction:   PONV Risk Score and Plan: 1 and Treatment may vary due to age or medical condition and Ondansetron  Airway Management Planned: Natural Airway and Nasal Cannula  Additional Equipment:   Intra-op Plan:   Post-operative Plan:   Informed Consent: I have reviewed the patients History and Physical, chart, labs and discussed the procedure including the risks, benefits and alternatives for the proposed anesthesia with the patient or authorized representative who has indicated his/her understanding and acceptance.   Dental advisory given and Consent reviewed with POA  Plan Discussed with: Surgeon and CRNA  Anesthesia Plan Comments:  (Plan routine monitors, SAB Pt's family strongly prefers SAB)        Anesthesia Quick Evaluation

## 2018-08-13 NOTE — Care Management Important Message (Signed)
Important Message  Patient Details  Name: Carl Murphy MRN: [id number] Date of Birth: [birth year]   Medicare Important Message Given:  Yes    Kerin Salen 08/13/2018, 10:23 AMImportant Message  Patient Details  Name: Carl Murphy MRN: [id number] Date of Birth: 1946/07/26   Medicare Important Message Given:  Yes    Kerin Salen 08/13/2018, 10:23 AM

## 2018-08-13 NOTE — Progress Notes (Signed)
Carl Murphy presents for palliative resection/bladder fulguration for progressive, metastatic bladder cancer.  He presented recently with a hemoglobin under 4.  His gross hematuria has resolved with bladder irrigation, but at this point I will resect any remaining tumor to limit his bleeding in the near future.  I discussed the procedure with the patient and his family who are aware that this is a non-curative process, but this is done for the patient's comfort and to decrease his transfusion frequency.

## 2018-08-13 NOTE — Op Note (Signed)
Preoperative diagnosis: Recurrent, progressive, metastatic bladder cancer with gross hematuria and significant anemia  Postoperative diagnosis: Same  Principal procedure: TURBT, greater than 5 cm lesion  Surgeon: Shawnette Augello  Anesthesia: Subarachnoid block  Specimen: Bladder tumor fragments  Drains: 22 French three-way hematuria catheter, to CBI  Estimated blood loss: Less than 25 mL  Complications: None  Indications: 72 year old male with metastatic, progressive bladder cancer.  As yet, he has not entered hospice care, but was admitted earlier this week with gross hematuria, clot production and hemoglobin of 3.9.  He was admitted for bladder drainage, irrigation and transfusion.  The patient and his family have requested that we proceed with palliative resection/cauterization of his bladder.  He has a small capacity bladder basically totally involved with neoplasm.  I discussed the procedure with the patient and his family.  They understand the risks and benefits, having been through several prior procedures.  They desire to proceed.  Findings: Urethra was normal, prostate nonobstructive.  Bladder was of small capacity.  There was a fair amount of clot within the bladder which was irrigated.  Basically, papillary tumor and sessile tumor lined/involved the entire bladder.  Ureteral orifices were encased in the process.  The patient does have bilateral percutaneous nephrostomy tubes.  Description of procedure: The patient was properly identified in the holding area.  He has been on Zosyn.  He was taken to the operating room where anesthetic was administered--subarachnoid block.  He was then placed in the dorsolithotomy position.  Genitalia and perineum were prepped and draped, proper timeout was performed.  21 French obturator was placed using the visual obturator.  Resectoscope and cutting loop were utilized.  Starting posteriorly and then working in a clockwise fashion, the entire surface of  the bladder was resected of the papillary lesions.  These were present circumferentially, for approximately a diameter of 10 cm.  The papillary lesions were fairly low-lying.  Resection was taken down into the muscular layer.  Following resection of all the papillary lesions and the sessile lesions as well, cutting loop was used to cauterize all urothelium/resected areas.  Following this the fragments were irrigated from the bladder and sent for pathology.  There was adequate hemostasis at this point.  The resectoscope was removed and a 58 [place] three-way hematuria catheter was placed and hooked to CBI.  20 mL of water was placed in the balloon.  The patient tolerated procedure well.  He was then taken to the PACU in stable condition.

## 2018-08-13 NOTE — Transfer of Care (Signed)
Immediate Anesthesia Transfer of Care Note  Patient: Carl Murphy  Procedure(s) Performed: TRANSURETHRAL RESECTION OF BLADDER TUMOR (TURBT) (N/A Bladder)  Patient Location: PACU  Anesthesia Type:Spinal  Level of Consciousness: awake, drowsy and patient cooperative  Airway & Oxygen Therapy: Patient Spontanous Breathing and Patient connected to face mask oxygen  Post-op Assessment: Report given to RN and Post -op Vital signs reviewed and stable  Post vital signs: stable  Last Vitals:  Vitals Value Taken Time  BP 109/53 08/13/2018  4:00 PM  Temp    Pulse 49 08/13/2018  4:06 PM  Resp 14 08/13/2018  4:08 PM  SpO2 95 % 08/13/2018  4:06 PM  Vitals shown include unvalidated device data.  Last Pain:  Vitals:   08/13/18 1411  TempSrc:   PainSc: Asleep      Patients Stated Pain Goal: 0 (91/66/06 0045)  Complications: No apparent anesthesia complications

## 2018-08-13 NOTE — Progress Notes (Signed)
Carl NOTE    CREWS Murphy  FTD:[id number] DOB: 12-20-1945 DOA: 08/09/2018 PCP: [name], MD   Brief Narrative:  Patient is a 72 year old male with past medical history of bladder cancer, COPD, bilateral hydronephrosis, CKD stage IV, status post bilateral nephrostomy tubes placement who was admitted from home after he presented with complaints of hematuria, headache and weakness.  Upon arrival to the emergency department, he had hemoglobin of 3.7.  Blood cultures that were sent on 08/10/2018 have resulted positive for enterococcus.  Blood culture sent on 08/11/2018 grew Pseudomonas.  ID consulted . Assessment & Plan:   Principal Problem:   Enterococcal bacteremia Active Problems:   Malignant neoplasm of trigone of bladder (HCC)   Acute blood loss anemia   Gross hematuria   COPD (chronic obstructive pulmonary disease) (HCC)   Tobacco dependence   CKD (chronic kidney disease)   Bilateral hydronephrosis   Acute renal failure superimposed on stage 4 chronic kidney disease (HCC)   Nephrostomy status ([place]), bilateral   Malnutrition of moderate degree   Bacteremia due to Pseudomonas  Polymicrobial bacteremia: Blood culture growing enterococcus and Pseudomonas.  Currently on Zosyn.  ID following.  Repeat blood cultures tomorrow.  No plan for removal of the Port-A-Cath.  Plan for total of 14 days of antibiotics after first negative blood culture..  Echocardiogram done on 08/10/1989 did not show any vegetations.  Bladder cancer: Has history of bladder cancer.  Status post placement of bilateral nephrostomy tubes in June this year.  Recently started on chemotherapy.  Urology following.  Has persistent hematuria with severe anemia .Urology planning for cystoscopy with fulguration/palliative resection today.  He is on continuous bladder irrigation.  Acute blood loss anemia: Secondary to hematuria from bladder cancer.  Status post transfusion.  His hemoglobin is 8.7 today.  We will continue to  monitor his H&H and transfuse as needed.  Hypertension: Blood pressure is currently stable.  Continue current medications.  COPD: Currently stable and on baseline  Acute on chronic kidney disease stage IV-V: Nephrology was following.  Acute  kidney injury suspected to be from acute tubular necrosis/severe anemia with hemodynamic compromise/obstructive issues with bladder cancer.  Continue IV fluids.  Avoid nephrotoxic agents. His baseline creatinine is around 4.   DVT prophylaxis: SCD Code Status: partial code Family Communication: None present at the bedside Disposition Plan: Needs to be determined.    Consultants: Urology, ID, nephrology  Procedures: None  Antimicrobials: Zosyn  Subjective: Patient seen and examined at bedside this morning.  He was sleeping.  Hemodynamically stable.  Could not awake him from sleep during my evaluation.  Was not in any kind of distress.  Objective: Vitals:   08/13/18 0708 08/13/18 0913 08/13/18 0917 08/13/18 1045  BP:  (!) 119/98    Pulse:  (!) 53 (!) 52 (!) 44  Resp:  16    Temp:      TempSrc:      SpO2:  91%  96%  Weight: 75.2 kg     Height:        Intake/Output Summary (Last 24 hours) at 08/13/2018 1313 Last data filed at 08/13/2018 1228 Gross per 24 hour  Intake 18701.92 ml  Output 23350 ml  Net -4648.08 ml   Filed Weights   08/11/18 0400 08/12/18 0500 08/13/18 0708  Weight: 74.9 kg 75.8 kg 75.2 kg    Examination:  General exam: Not in distress,average built HEENT:PERRL,Oral mucosa moist, Ear/Nose normal on gross exam Respiratory system: Bilateral equal air entry, normal vesicular  breath sounds, no wheezes or crackles  Cardiovascular system: S1 & S2 heard, RRR. No JVD, murmurs, rubs, gallops or clicks. No pedal edema. Gastrointestinal system: Abdomen is nondistended, soft and nontender. No organomegaly or masses felt. Normal bowel sounds heard.  Bilateral nephrostomy tube placement Central nervous system: Not alert or  awake Extremities: No edema, no clubbing ,no cyanosis, distal peripheral pulses palpable. Skin: No rashes, lesions or ulcers,no icterus ,no pallor GU: Foley with bladder irrigation   Data Reviewed: I have personally reviewed following labs and imaging studies  CBC: Recent Labs  Lab 08/09/18 1220 08/09/18 1438 08/10/18 0716 08/11/18 0400 08/12/18 1048  WBC 4.5 4.7 4.2 5.6 5.5  NEUTROABS 3.6 3.8  --   --   --   HGB 4.3* 3.9* 9.5* 9.8* 8.7*  HCT 13.2* 12.2* 28.3* 29.3* 27.0*  MCV 96.4 96.1 91.3 90.7 92.8  PLT 182 167 116* 144* 440*   Basic Metabolic Panel: Recent Labs  Lab 08/09/18 1438 08/10/18 0716 08/11/18 0400 08/12/18 1048  NA 136 136 139 140  K 4.8 4.8 4.8 4.6  CL 112* 112* 115* 115*  CO2 17* 18* 19* 17*  GLUCOSE 104* 81 84 68*  BUN 44* 39* 35* 28*  CREATININE 5.11* 4.91* 4.55* 3.95*  CALCIUM 7.0* 6.7* 7.0* 6.9*  PHOS  --   --  3.8  --    GFR: Estimated Creatinine Clearance: 16.8 mL/min (A) (by C-G formula based on SCr of 3.95 mg/dL (H)). Liver Function Tests: Recent Labs  Lab 08/09/18 1438 08/11/18 0400  AST 20  --   ALT 9  --   ALKPHOS 54  --   BILITOT 0.5  --   PROT 5.7*  --   ALBUMIN 2.4* 2.2*   No results for input(s): LIPASE, AMYLASE in the last 168 hours. No results for input(s): AMMONIA in the last 168 hours. Coagulation Profile: Recent Labs  Lab 08/09/18 1438  INR 1.03   Cardiac Enzymes: No results for input(s): CKTOTAL, CKMB, CKMBINDEX, TROPONINI in the last 168 hours. BNP (last 3 results) No results for input(s): PROBNP in the last 8760 hours. HbA1C: No results for input(s): HGBA1C in the last 72 hours. CBG: No results for input(s): GLUCAP in the last 168 hours. Lipid Profile: No results for input(s): CHOL, HDL, LDLCALC, TRIG, CHOLHDL, LDLDIRECT in the last 72 hours. Thyroid Function Tests: No results for input(s): TSH, T4TOTAL, FREET4, T3FREE, THYROIDAB in the last 72 hours. Anemia Panel: No results for input(s): VITAMINB12,  FOLATE, FERRITIN, TIBC, IRON, RETICCTPCT in the last 72 hours. Sepsis Labs: Recent Labs  Lab 08/09/18 1545  LATICACIDVEN 1.6    Recent Results (from the past 240 hour(s))  Culture, blood (Routine x 2)     Status: Abnormal   Collection Time: 08/09/18  2:38 PM  Result Value Ref Range Status   Specimen Description   Final    BLOOD RIGHT ANTECUBITAL Performed at [facility], [address], [place], [place] [address]    Special Requests   Final    BOTTLES DRAWN AEROBIC AND ANAEROBIC Blood Culture adequate volume Performed at [address] [place], [place] [address]    Culture  Setup Time   Final    GRAM POSITIVE COCCI IN CHAINS IN BOTH AEROBIC AND ANAEROBIC BOTTLES Gram Stain Report Called to,Read Back By and Verified With: HEARNE,J AT 0630 BY HUFFINES,S ON 08/10/18. CRITICAL VALUE NOTED.  VALUE IS CONSISTENT WITH PREVIOUSLY REPORTED AND CALLED VALUE.    Culture (A)  Final  ENTEROCOCCUS FAECALIS SUSCEPTIBILITIES PERFORMED ON PREVIOUS CULTURE WITHIN THE LAST 5 DAYS. Performed at [facility], [place] [address], [place], [place] [address]    Report Status 08/12/2018 FINAL  Final  Culture, blood (Routine x 2)     Status: Abnormal   Collection Time: 08/09/18  3:45 PM  Result Value Ref Range Status   Specimen Description   Final    BLOOD CENTRAL LINE Performed at [facility], [address], [place], [place] [address]    Special Requests   Final    BOTTLES DRAWN AEROBIC AND ANAEROBIC Blood Culture adequate volume Performed at [address] [place], [place] [address]    Culture  Setup Time   Final    GRAM POSITIVE COCCI Gram Stain Report Called to,Read Back By and Verified With: HEARN,J.  [id number] ON 08/10/2018 BY EVA IN BOTH AEROBIC AND ANAEROBIC BOTTLES Performed at [place], READ BACK BY AND VERIFIED WITH: Johny Chess RN [id number] 08/10/18 A BROWNING    Culture ENTEROCOCCUS FAECALIS (A)  Final   Report Status  08/12/2018 FINAL  Final   Organism ID, Bacteria ENTEROCOCCUS FAECALIS  Final      Susceptibility   Enterococcus faecalis - MIC*    AMPICILLIN <=2 SENSITIVE Sensitive     VANCOMYCIN 1 SENSITIVE Sensitive     GENTAMICIN SYNERGY SENSITIVE Sensitive     * ENTEROCOCCUS FAECALIS  Blood Culture ID Panel (Reflexed)     Status: Abnormal   Collection Time: 08/09/18  3:45 PM  Result Value Ref Range Status   Enterococcus species DETECTED (A) NOT DETECTED Final    Comment: CRITICAL RESULT CALLED TO, READ BACK BY AND VERIFIED WITH: Johny Chess RN [phone] 08/10/18 A BROWNING    Vancomycin resistance NOT DETECTED NOT DETECTED Final   Listeria monocytogenes NOT DETECTED NOT DETECTED Final   Staphylococcus species NOT DETECTED NOT DETECTED Final   Staphylococcus aureus NOT DETECTED NOT DETECTED Final   Streptococcus species NOT DETECTED NOT DETECTED Final   Streptococcus agalactiae NOT DETECTED NOT DETECTED Final   Streptococcus pneumoniae NOT DETECTED NOT DETECTED Final   Streptococcus pyogenes NOT DETECTED NOT DETECTED Final   Acinetobacter baumannii NOT DETECTED NOT DETECTED Final   Enterobacteriaceae species NOT DETECTED NOT DETECTED Final   Enterobacter cloacae complex NOT DETECTED NOT DETECTED Final   Escherichia coli NOT DETECTED NOT DETECTED Final   Klebsiella oxytoca NOT DETECTED NOT DETECTED Final   Klebsiella pneumoniae NOT DETECTED NOT DETECTED Final   Proteus species NOT DETECTED NOT DETECTED Final   Serratia marcescens NOT DETECTED NOT DETECTED Final   Haemophilus influenzae NOT DETECTED NOT DETECTED Final   Neisseria meningitidis NOT DETECTED NOT DETECTED Final   Pseudomonas aeruginosa NOT DETECTED NOT DETECTED Final   Candida albicans NOT DETECTED NOT DETECTED Final   Candida glabrata NOT DETECTED NOT DETECTED Final   Candida krusei NOT DETECTED NOT DETECTED Final   Candida parapsilosis NOT DETECTED NOT DETECTED Final   Candida tropicalis NOT DETECTED NOT DETECTED Final    Comment:  Performed at [facility], [address], [place], [place] [address]  Culture, Urine     Status: Abnormal   Collection Time: 08/10/18  3:48 AM  Result Value Ref Range Status   Specimen Description   Final    URINE, CLEAN CATCH Performed at [facility], [address] [place], [place] [address]    Special Requests   Final    Normal Performed at [facility], [address] [place], [place] [address]    Culture MULTIPLE SPECIES PRESENT, SUGGEST RECOLLECTION (A)  Final   Report Status 08/12/2018 FINAL  Final  Culture, Urine     Status: Abnormal   Collection Time: 08/10/18 10:15 AM  Result Value Ref Range Status   Specimen Description   Final    URINE, CATHETERIZED Performed at [facility], [address], [place], [place] [address]    Special Requests   Final    NONE Performed at [facility], [address] [place], [place] [address]    Culture MULTIPLE SPECIES PRESENT, SUGGEST RECOLLECTION (A)  Final   Report Status 08/12/2018 FINAL  Final  Culture, blood (Routine X 2) w Reflex to ID Panel     Status: None (Preliminary result)   Collection Time: 08/11/18  2:07 PM  Result Value Ref Range Status   Specimen Description BLOOD LEFT ANTECUBITAL  Final   Special Requests   Final    BOTTLES DRAWN AEROBIC AND ANAEROBIC Blood Culture results may not be optimal due to an excessive volume of blood received in culture bottles   Culture   Final    NO GROWTH 2 DAYS Performed at [facility], [address] [place], [place] [address]    Report Status PENDING  Incomplete  Culture, blood (Routine X 2) w Reflex to ID Panel     Status: Abnormal (Preliminary result)   Collection Time: 08/11/18  2:07 PM  Result Value Ref Range Status   Specimen Description   Final    BLOOD RIGHT ANTECUBITAL Performed at [facility], [address], [place], [place] [address]    Special Requests   Final    BOTTLES DRAWN AEROBIC AND ANAEROBIC Blood Culture adequate volume Performed at [facility], [address], [place], [place] [address]    Culture  Setup Time   Final    GRAM NEGATIVE RODS RECOVERED FROM THE AEROBIC BOTTLE Gram Stain Report Called to,Read Back By and Verified With: SMITH,J. AT 1110 ON 08/12/2018 BY BAUGHAM,M. Performed at [place] BY AND VERIFIED WITH: E MURPHY RN 08/12/18 1703 JDW Performed at [facility], [place] [address] [place], [place] [address]    Culture PSEUDOMONAS AERUGINOSA (A)  Final   Report Status PENDING  Incomplete  Blood Culture ID Panel (Reflexed)     Status: Abnormal   Collection Time: 08/11/18  2:07 PM  Result Value Ref Range Status   Enterococcus species NOT DETECTED NOT DETECTED Final   Listeria monocytogenes NOT DETECTED NOT DETECTED Final   Staphylococcus species NOT DETECTED NOT DETECTED Final   Staphylococcus aureus NOT DETECTED NOT DETECTED Final   Streptococcus species NOT DETECTED NOT DETECTED Final   Streptococcus agalactiae NOT DETECTED NOT DETECTED Final   Streptococcus pneumoniae NOT DETECTED NOT DETECTED Final   Streptococcus pyogenes NOT DETECTED NOT DETECTED Final   Acinetobacter baumannii NOT DETECTED NOT DETECTED Final   Enterobacteriaceae species NOT DETECTED NOT DETECTED Final   Enterobacter cloacae complex NOT DETECTED NOT DETECTED Final   Escherichia coli NOT DETECTED NOT DETECTED Final   Klebsiella oxytoca NOT DETECTED NOT DETECTED Final   Klebsiella pneumoniae NOT DETECTED NOT DETECTED Final   Proteus species NOT DETECTED NOT DETECTED Final   Serratia marcescens NOT DETECTED NOT DETECTED Final   Carbapenem resistance NOT DETECTED NOT DETECTED Final   Haemophilus influenzae NOT DETECTED NOT DETECTED Final   Neisseria meningitidis NOT DETECTED NOT DETECTED Final   Pseudomonas  aeruginosa DETECTED (A) NOT DETECTED Final    Comment: CRITICAL RESULT CALLED TO, READ BACK BY AND VERIFIED WITH: E MURPHY RN 08/12/18 1703 JDW    Candida albicans NOT DETECTED NOT  DETECTED Final   Candida glabrata NOT DETECTED NOT DETECTED Final   Candida krusei NOT DETECTED NOT DETECTED Final   Candida parapsilosis NOT DETECTED NOT DETECTED Final   Candida tropicalis NOT DETECTED NOT DETECTED Final    Comment: Performed at [facility], [place] [address] [place], [place] [address]         Radiology Studies: No results found.      Scheduled Meds: . sodium chloride   Intravenous Once  . carvedilol  3.125 mg Oral BID WC  . chlorhexidine  15 mL Mouth/Throat QID  . Chlorhexidine Gluconate Cloth  6 each Topical Once   And  . Chlorhexidine Gluconate Cloth  6 each Topical Once  . citalopram  20 mg Oral Daily  . Influenza vac split quadrivalent PF  0.5 mL Intramuscular Tomorrow-1000  . ipratropium-albuterol  3 mL Nebulization TID  . nicotine  14 mg Transdermal Daily  . sodium chloride flush  10-40 mL Intracatheter Q12H  . sodium chloride flush  3 mL Intravenous Q12H  . sucralfate  1 g Oral TID WC & HS   Continuous Infusions: . sodium chloride Stopped (08/10/18 1513)  . sodium chloride 125 mL/hr at 08/13/18 1045  . piperacillin-tazobactam (ZOSYN)  IV       LOS: 3 days    Time spent: 35 mins.More than 50% of that time was spent in counseling and/or coordination of care.      Shelly Coss, MD [facility] Pager [phone]  If 7PM-7AM, please contact night-coverage [email or web address] Password [email or web address] 08/13/2018, 1:13 PM

## 2018-08-13 NOTE — Progress Notes (Addendum)
Patient ID: Carl Murphy, male   DOB: 06-Jan-1946, 72 y.o.   MRN: [id number]         [facility] for Infectious Disease  Date of Admission:  08/09/2018    Total days of antibiotics 4        Day 2 piperacillin tazobactam         ASSESSMENT: He has polymicrobial bacteremia with enterococcus and Pseudomonas, most likely from a urinary tract source.  His son-in-law indicated that Carl Murphy and his family are very eager to get him back home as soon as he can.  He tells me that Carl Murphy "does not want to die in the hospital."  Assuming that his Pseudomonas is susceptible to piperacillin tazobactam will plan on continuing it for 2 weeks after his first negative blood culture.  I have ordered repeat blood cultures for tomorrow morning.  There was no evidence of endocarditis by TTE the antibiotic will be administered through his Port-A-Cath.  I would not recommend a Port-A-Cath removal or TEE given his current situation and the desire to get him home as soon as possible.  PLAN: 1. Continue piperacillin tazobactam pending final blood culture results 2. Repeat blood cultures in a.m.  3. Please call Dr. Dietrich [place] [phone]) for any infectious disease questions this weekend  Diagnosis: Bacteremia  Culture Result: Enterococcus and Pseudomonas  Allergies  Allergen Reactions  . Carboplatin Shortness Of Breath    Reaction occurred after patient left [facility]. Pt reported extreme sob after leaving CC that eventually resolved on its own.    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 sodium chloride   Intravenous Once  . carvedilol  3.125 mg Oral BID WC  . chlorhexidine  15 mL Mouth/Throat QID  . Chlorhexidine Gluconate Cloth  6 each Topical Once   And  . Chlorhexidine Gluconate Cloth  6 each Topical Once  . citalopram  20 mg Oral Daily  . Influenza vac split quadrivalent PF  0.5 mL Intramuscular Tomorrow-1000  . ipratropium-albuterol  3 mL Nebulization TID  . nicotine  14 mg Transdermal Daily  . sodium chloride flush  10-40 mL Intracatheter Q12H  . sodium chloride flush  3 mL Intravenous Q12H  . sucralfate  1 g Oral TID WC & HS   Continuous Infusions: . sodium chloride Stopped (08/10/18 1513)  . sodium chloride 125 mL/hr at 08/13/18 0217  . piperacillin-tazobactam (ZOSYN)  IV     PRN Meds:.sodium chloride, acetaminophen, albuterol, magic mouthwash, morphine injection, ondansetron **OR** ondansetron (ZOFRAN) IV, opium-belladonna, oxyCODONE-acetaminophen, sodium chloride flush, sodium chloride flush   Review of Systems: Review of Systems  Unable to perform ROS: Mental acuity    Allergies  Allergen Reactions  . Carboplatin Shortness Of Breath    Reaction occurred after patient left Cancer Center. Pt reported extreme sob after leaving CC that eventually resolved on its own.    OBJECTIVE: Vitals:   08/12/18 2150 08/12/18 2158 08/13/18 0708 08/13/18 0913  BP: (!) 96/57   (!) 119/98  Pulse: 64 74  (!) 53  Resp: 20   16  Temp: 97.7 F (36.5 C)     TempSrc:      SpO2: (!) 81% 93%  91%  Weight:   75.2  kg   Height:       Body mass index is 25.21 kg/m.  Physical Exam  Constitutional:  He is sleeping soundly.  His son-in-law is at the bedside.  Pulmonary/Chest:      Lab Results Lab Results  Component Value Date   WBC 5.5 08/12/2018   HGB 8.7 (L) 08/12/2018   HCT 27.0 (L) 08/12/2018   MCV 92.8 08/12/2018   PLT 129 (L) 08/12/2018    Lab Results  Component Value Date   CREATININE 3.95 (H) 08/12/2018   BUN 28 (H) 08/12/2018   NA 140 08/12/2018   K 4.6 08/12/2018   CL 115 (H) 08/12/2018   CO2 17 (L) 08/12/2018    Lab Results  Component Value Date   ALT 9 08/09/2018   AST 20 08/09/2018   ALKPHOS 54 08/09/2018   BILITOT 0.5 08/09/2018     Microbiology: Recent Results (from the past 240 hour(s))  Culture, blood (Routine x 2)     Status: Abnormal   Collection Time: 08/09/18  2:38 PM  Result Value Ref Range Status   Specimen Description   Final    BLOOD RIGHT ANTECUBITAL Performed at [facility], [address] [place], [place] [address]    Special Requests   Final    BOTTLES DRAWN AEROBIC AND ANAEROBIC Blood Culture adequate volume Performed at [place]., [place], [place] [address]    Culture  Setup Time   Final    GRAM POSITIVE COCCI IN CHAINS IN BOTH AEROBIC AND ANAEROBIC BOTTLES Gram Stain Report Called to,Read Back By and Verified With: HEARNE,J AT 0630 BY HUFFINES,S ON 08/10/18. CRITICAL VALUE NOTED.  VALUE IS CONSISTENT WITH PREVIOUSLY REPORTED AND CALLED VALUE.    Culture (A)  Final    ENTEROCOCCUS FAECALIS SUSCEPTIBILITIES PERFORMED ON PREVIOUS CULTURE WITHIN THE LAST 5 DAYS. Performed at [facility], [place] [address] [place], [place] [address]    Report Status 08/12/2018 FINAL  Final  Culture, blood (Routine x 2)     Status: Abnormal   Collection Time: 08/09/18  3:45 PM  Result Value Ref Range Status   Specimen Description   Final    BLOOD CENTRAL LINE Performed at [facility], [address] [place], [place] [address]      Special Requests   Final    BOTTLES DRAWN AEROBIC AND ANAEROBIC Blood Culture adequate volume Performed at [facility], [address], [place], [place] [address]    Culture  Setup Time   Final    GRAM POSITIVE COCCI Gram Stain Report Called to,Read Back By and Verified With: HEARN,J.  [id number] ON 08/10/2018 BY EVA IN BOTH AEROBIC AND ANAEROBIC BOTTLES Performed at [place], READ BACK BY AND VERIFIED WITHJohny Chess RN [id number] 08/10/18 A BROWNING    Culture ENTEROCOCCUS FAECALIS (A)  Final  Report Status 08/12/2018 FINAL  Final   Organism ID, Bacteria ENTEROCOCCUS FAECALIS  Final      Susceptibility   Enterococcus faecalis - MIC*    AMPICILLIN <=2 SENSITIVE Sensitive     VANCOMYCIN 1 SENSITIVE Sensitive     GENTAMICIN SYNERGY SENSITIVE Sensitive     * ENTEROCOCCUS FAECALIS  Blood Culture ID Panel (Reflexed)     Status: Abnormal   Collection Time: 08/09/18  3:45 PM  Result Value Ref Range Status   Enterococcus species DETECTED (A) NOT DETECTED Final    Comment: CRITICAL RESULT CALLED TO, READ BACK BY AND VERIFIED WITH: Johny Chess RN [phone] 08/10/18 A BROWNING    Vancomycin resistance NOT DETECTED NOT DETECTED Final   Listeria monocytogenes NOT DETECTED NOT DETECTED Final   Staphylococcus species NOT DETECTED NOT DETECTED Final   Staphylococcus aureus NOT DETECTED NOT DETECTED Final   Streptococcus species NOT DETECTED NOT DETECTED Final   Streptococcus agalactiae NOT DETECTED NOT DETECTED Final   Streptococcus pneumoniae NOT DETECTED NOT DETECTED Final   Streptococcus pyogenes NOT DETECTED NOT DETECTED Final   Acinetobacter baumannii NOT DETECTED NOT DETECTED Final   Enterobacteriaceae species NOT DETECTED NOT DETECTED Final   Enterobacter cloacae complex NOT DETECTED NOT DETECTED Final   Escherichia coli NOT DETECTED NOT DETECTED Final   Klebsiella oxytoca NOT DETECTED NOT DETECTED Final   Klebsiella pneumoniae NOT DETECTED NOT DETECTED Final    Proteus species NOT DETECTED NOT DETECTED Final   Serratia marcescens NOT DETECTED NOT DETECTED Final   Haemophilus influenzae NOT DETECTED NOT DETECTED Final   Neisseria meningitidis NOT DETECTED NOT DETECTED Final   Pseudomonas aeruginosa NOT DETECTED NOT DETECTED Final   Candida albicans NOT DETECTED NOT DETECTED Final   Candida glabrata NOT DETECTED NOT DETECTED Final   Candida krusei NOT DETECTED NOT DETECTED Final   Candida parapsilosis NOT DETECTED NOT DETECTED Final   Candida tropicalis NOT DETECTED NOT DETECTED Final    Comment: Performed at [facility], [address], [place], [place] [address]  Culture, Urine     Status: Abnormal   Collection Time: 08/10/18  3:48 AM  Result Value Ref Range Status   Specimen Description   Final    URINE, CLEAN CATCH Performed at [facility] [place] [facility], [address], [place], [place] [address]    Special Requests   Final    Normal Performed at [facility], [address] [place], [place] [address]    Culture MULTIPLE SPECIES PRESENT, SUGGEST RECOLLECTION (A)  Final   Report Status 08/12/2018 FINAL  Final  Culture, Urine     Status: Abnormal   Collection Time: 08/10/18 10:15 AM  Result Value Ref Range Status   Specimen Description   Final    URINE, CATHETERIZED Performed at [facility], [address], [place], [place] [address]    Special Requests   Final    NONE Performed at [facility], [address], [place], [place] [address]    Culture MULTIPLE SPECIES PRESENT, SUGGEST RECOLLECTION (A)  Final   Report Status 08/12/2018 FINAL  Final  Culture, blood (Routine X 2) w Reflex to ID Panel     Status: None (Preliminary result)   Collection Time: 08/11/18  2:07 PM  Result Value Ref Range Status   Specimen Description BLOOD LEFT ANTECUBITAL  Final   Special Requests   Final    BOTTLES DRAWN AEROBIC AND ANAEROBIC Blood Culture results may not be optimal due to an excessive volume of blood received in  culture bottles   Culture   Final     NO GROWTH 2 DAYS Performed at [facility], [address], [place], [place] [address]    Report Status PENDING  Incomplete  Culture, blood (Routine X 2) w Reflex to ID Panel     Status: None (Preliminary result)   Collection Time: 08/11/18  2:07 PM  Result Value Ref Range Status   Specimen Description   Final    BLOOD RIGHT ANTECUBITAL Performed at [facility], [address], [place], [place] [address]    Special Requests   Final    BOTTLES DRAWN AEROBIC AND ANAEROBIC Blood Culture adequate volume Performed at [facility], [address], [place], [place] [address]    Culture  Setup Time   Final    GRAM NEGATIVE RODS RECOVERED FROM THE AEROBIC BOTTLE Gram Stain Report Called to,Read Back By and Verified With: SMITH,J. AT 4765 ON 08/12/2018 BY BAUGHAM,M. Performed at [facility] BY AND VERIFIED WITH: E MURPHY RN 08/12/18 1703 JDW Performed at [facility], [place] [address] [place], [place] [address]    Culture GRAM NEGATIVE RODS  Final   Report Status PENDING  Incomplete  Blood Culture ID Panel (Reflexed)     Status: Abnormal   Collection Time: 08/11/18  2:07 PM  Result Value Ref Range Status   Enterococcus species NOT DETECTED NOT DETECTED Final   Listeria monocytogenes NOT DETECTED NOT DETECTED Final   Staphylococcus species NOT DETECTED NOT DETECTED Final   Staphylococcus aureus NOT DETECTED NOT DETECTED Final   Streptococcus species NOT DETECTED NOT DETECTED Final   Streptococcus agalactiae NOT DETECTED NOT DETECTED Final   Streptococcus pneumoniae NOT DETECTED NOT DETECTED Final   Streptococcus pyogenes NOT DETECTED NOT DETECTED Final   Acinetobacter baumannii NOT DETECTED NOT DETECTED Final   Enterobacteriaceae species NOT DETECTED NOT DETECTED Final   Enterobacter cloacae complex NOT DETECTED NOT DETECTED Final   Escherichia coli NOT DETECTED NOT DETECTED Final   Klebsiella oxytoca NOT DETECTED NOT DETECTED Final    Klebsiella pneumoniae NOT DETECTED NOT DETECTED Final   Proteus species NOT DETECTED NOT DETECTED Final   Serratia marcescens NOT DETECTED NOT DETECTED Final   Carbapenem resistance NOT DETECTED NOT DETECTED Final   Haemophilus influenzae NOT DETECTED NOT DETECTED Final   Neisseria meningitidis NOT DETECTED NOT DETECTED Final   Pseudomonas aeruginosa DETECTED (A) NOT DETECTED Final    Comment: CRITICAL RESULT CALLED TO, READ BACK BY AND VERIFIED WITH: E MURPHY RN 08/12/18 1703 JDW    Candida albicans NOT DETECTED NOT DETECTED Final   Candida glabrata NOT DETECTED NOT DETECTED Final   Candida krusei NOT DETECTED NOT DETECTED Final   Candida parapsilosis NOT DETECTED NOT DETECTED Final   Candida tropicalis NOT DETECTED NOT DETECTED Final    Comment: Performed at [facility], [address] [place], [place] [address]    Michel Bickers, [place] [facility] [place] [facility] [phone] pager   [phone] cell 08/13/2018, 9:38 AM

## 2018-08-13 NOTE — Progress Notes (Signed)
Pharmacy Antibiotic Note  Carl Murphy is a 72 y.o. male with metastatic bladder cancer (on chemotherapy) with bilateral nephrostomy tubes, presented to [facility] on 08/09/2018 for severe anemia.  He subsequently had enterococcal and PsA bacteremia.  Patient transferred to [facility] on 9/12 for cystoscopy with fulguration/palliative resection planned for  08/13/2018.  He's currently on zosyn for infection.  Today, 08/13/2018: - afeb, wbc wnl - scr elevated but down 3.95 (crcl~16) - ID recommends to treat with at least 2 wks of IV abx after Port-A- cath removal  Plan: - change zosyn dose to 2.25 gm IV q6h - monitor renal function closely - f/u on cultures  _______________________________  Height: 5\' 8"  (172.7 cm) Weight: 165 lb 12.8 oz (75.2 kg) IBW/kg (Calculated) : 68.4  Temp (24hrs), Avg:97.6 F (36.4 C), Min:97.3 F (36.3 C), Max:97.7 F (36.5 C)  Recent Labs  Lab 08/09/18 1220 08/09/18 1438 08/09/18 1545 08/10/18 0716 08/11/18 0400 08/12/18 1048  WBC 4.5 4.7  --  4.2 5.6 5.5  CREATININE  --  5.11*  --  4.91* 4.55* 3.95*  LATICACIDVEN  --   --  1.6  --   --   --     Estimated Creatinine Clearance: 16.8 mL/min (A) (by C-G formula based on SCr of 3.95 mg/dL (H)).    Allergies  Allergen Reactions  . Carboplatin Shortness Of Breath    Reaction occurred after patient left Cancer Center. Pt reported extreme sob after leaving CC that eventually resolved on its own.    Antimicrobials this admission: Ampicillin 9/10>>9/12 Zosyn 9/12 >>    Dose adjustments this admission: --  Microbiology results: 9/9 BCx2: 2/2 Enterococcus Faecalis (pans sens) FINAL 9/10 ucx: multi species, need recollection FINAL 9/11 BCx2: 1/2 GNR (BCID= PsA)  Thank you for allowing pharmacy to be a part of this patient's care.  Lynelle Doctor 08/13/2018 8:02 AM

## 2018-08-13 NOTE — Anesthesia Postprocedure Evaluation (Signed)
Anesthesia Post Note  Patient: Carl Murphy  Procedure(s) Performed: TRANSURETHRAL RESECTION OF BLADDER TUMOR (TURBT) (N/A Bladder)     Patient location during evaluation: PACU Anesthesia Type: Spinal Level of consciousness: lethargic, confused and responds to stimulation Pain management: pain level controlled Vital Signs Assessment: post-procedure vital signs reviewed and stable Respiratory status: spontaneous breathing, nonlabored ventilation, respiratory function stable and patient connected to nasal cannula oxygen Cardiovascular status: blood pressure returned to baseline and stable Postop Assessment: patient able to bend at knees and no apparent nausea or vomiting Anesthetic complications: no    Last Vitals:  Vitals:   08/13/18 1715 08/13/18 1730  BP: 95/70   Pulse: (!) 50   Resp: 13   Temp: (!) 34.2 C (!) 35.6 C  SpO2: 93%     Last Pain:  Vitals:   08/13/18 1730  TempSrc:   PainSc: Asleep                 Marlen Koman,E. Edmonia Gonser

## 2018-08-13 NOTE — Progress Notes (Signed)
Patient will hopefully be able to have catheter removed on Saturday morning.  From urologic standpoint, he can go home anytime after that.  Dr. Michel Bickers from infectious disease has suggested that the Port-A-Cath not be removed during the hospitalization.  This can be utilized for his home IV antibiotic therapy.

## 2018-08-14 ENCOUNTER — Encounter (HOSPITAL_COMMUNITY): Payer: Self-pay | Admitting: Urology

## 2018-08-14 DIAGNOSIS — E440 Moderate protein-calorie malnutrition: Secondary | ICD-10-CM | POA: Diagnosis present

## 2018-08-14 DIAGNOSIS — B965 Pseudomonas (aeruginosa) (mallei) (pseudomallei) as the cause of diseases classified elsewhere: Secondary | ICD-10-CM | POA: Diagnosis present

## 2018-08-14 DIAGNOSIS — R7881 Bacteremia: Secondary | ICD-10-CM | POA: Diagnosis not present

## 2018-08-14 DIAGNOSIS — F17210 Nicotine dependence, cigarettes, uncomplicated: Secondary | ICD-10-CM | POA: Diagnosis present

## 2018-08-14 DIAGNOSIS — D649 Anemia, unspecified: Secondary | ICD-10-CM | POA: Diagnosis present

## 2018-08-14 DIAGNOSIS — Z6824 Body mass index (BMI) 24.0-24.9, adult: Secondary | ICD-10-CM | POA: Diagnosis not present

## 2018-08-14 DIAGNOSIS — N179 Acute kidney failure, unspecified: Secondary | ICD-10-CM | POA: Diagnosis present

## 2018-08-14 DIAGNOSIS — N1330 Unspecified hydronephrosis: Secondary | ICD-10-CM | POA: Diagnosis present

## 2018-08-14 DIAGNOSIS — G893 Neoplasm related pain (acute) (chronic): Secondary | ICD-10-CM | POA: Diagnosis present

## 2018-08-14 DIAGNOSIS — J449 Chronic obstructive pulmonary disease, unspecified: Secondary | ICD-10-CM | POA: Diagnosis present

## 2018-08-14 DIAGNOSIS — H9190 Unspecified hearing loss, unspecified ear: Secondary | ICD-10-CM | POA: Diagnosis present

## 2018-08-14 DIAGNOSIS — Z936 Other artificial openings of urinary tract status: Secondary | ICD-10-CM | POA: Diagnosis not present

## 2018-08-14 DIAGNOSIS — Z515 Encounter for palliative care: Secondary | ICD-10-CM

## 2018-08-14 DIAGNOSIS — B952 Enterococcus as the cause of diseases classified elsewhere: Secondary | ICD-10-CM | POA: Diagnosis present

## 2018-08-14 DIAGNOSIS — F329 Major depressive disorder, single episode, unspecified: Secondary | ICD-10-CM | POA: Diagnosis present

## 2018-08-14 DIAGNOSIS — Z888 Allergy status to other drugs, medicaments and biological substances status: Secondary | ICD-10-CM | POA: Diagnosis not present

## 2018-08-14 DIAGNOSIS — C670 Malignant neoplasm of trigone of bladder: Secondary | ICD-10-CM | POA: Diagnosis present

## 2018-08-14 DIAGNOSIS — Z7189 Other specified counseling: Secondary | ICD-10-CM

## 2018-08-14 DIAGNOSIS — J9601 Acute respiratory failure with hypoxia: Secondary | ICD-10-CM | POA: Diagnosis present

## 2018-08-14 DIAGNOSIS — N028 Recurrent and persistent hematuria with other morphologic changes: Secondary | ICD-10-CM | POA: Diagnosis present

## 2018-08-14 DIAGNOSIS — G92 Toxic encephalopathy: Secondary | ICD-10-CM | POA: Diagnosis present

## 2018-08-14 DIAGNOSIS — Z66 Do not resuscitate: Secondary | ICD-10-CM | POA: Diagnosis present

## 2018-08-14 DIAGNOSIS — C7800 Secondary malignant neoplasm of unspecified lung: Secondary | ICD-10-CM | POA: Diagnosis present

## 2018-08-14 DIAGNOSIS — N184 Chronic kidney disease, stage 4 (severe): Secondary | ICD-10-CM | POA: Diagnosis present

## 2018-08-14 DIAGNOSIS — Z79899 Other long term (current) drug therapy: Secondary | ICD-10-CM | POA: Diagnosis not present

## 2018-08-14 DIAGNOSIS — Z7951 Long term (current) use of inhaled steroids: Secondary | ICD-10-CM | POA: Diagnosis not present

## 2018-08-14 DIAGNOSIS — D62 Acute posthemorrhagic anemia: Secondary | ICD-10-CM | POA: Diagnosis present

## 2018-08-14 LAB — BASIC METABOLIC PANEL
Anion gap: 10 (ref 5–15)
BUN: 35 mg/dL — ABNORMAL HIGH (ref 8–23)
CHLORIDE: 119 mmol/L — AB (ref 98–111)
CO2: 16 mmol/L — AB (ref 22–32)
Calcium: 7.1 mg/dL — ABNORMAL LOW (ref 8.9–10.3)
Creatinine, Ser: 4.68 mg/dL — ABNORMAL HIGH (ref 0.61–1.24)
GFR calc non Af Amer: 11 mL/min — ABNORMAL LOW (ref >60)
GFR, EST AFRICAN AMERICAN: 13 mL/min — AB (ref >60)
Glucose, Bld: 47 mg/dL — ABNORMAL LOW (ref 70–99)
Potassium: 5.3 mmol/L — ABNORMAL HIGH (ref 3.5–5.1)
Sodium: 145 mmol/L (ref 135–145)

## 2018-08-14 LAB — CBC WITH DIFFERENTIAL/PLATELET
BASOS ABS: 0 10*3/uL (ref 0.0–0.1)
Basophils Relative: 0 %
Eosinophils Absolute: 0 10*3/uL (ref 0.0–0.7)
Eosinophils Relative: 0 %
HEMATOCRIT: 23.3 % — AB (ref 39.0–52.0)
HEMOGLOBIN: 7.6 g/dL — AB (ref 13.0–17.0)
LYMPHS ABS: 0.4 10*3/uL — AB (ref 0.7–4.0)
LYMPHS PCT: 5 %
MCH: 31.3 pg (ref 26.0–34.0)
MCHC: 32.6 g/dL (ref 30.0–36.0)
MCV: 95.9 fL (ref 78.0–100.0)
Monocytes Absolute: 0.5 10*3/uL (ref 0.1–1.0)
Monocytes Relative: 6 %
NEUTROS ABS: 7 10*3/uL (ref 1.7–7.7)
NEUTROS PCT: 89 %
Platelets: 124 10*3/uL — ABNORMAL LOW (ref 150–400)
RBC: 2.43 MIL/uL — AB (ref 4.22–5.81)
RDW: 18.7 % — ABNORMAL HIGH (ref 11.5–15.5)
WBC: 7.9 10*3/uL (ref 4.0–10.5)

## 2018-08-14 LAB — CULTURE, BLOOD (ROUTINE X 2): SPECIAL REQUESTS: ADEQUATE

## 2018-08-14 LAB — GLUCOSE, CAPILLARY
Glucose-Capillary: 41 mg/dL — CL (ref 70–99)
Glucose-Capillary: 78 mg/dL (ref 70–99)

## 2018-08-14 MED ORDER — DEXTROSE 50 % IV SOLN
INTRAVENOUS | Status: AC
  Administered 2018-08-14: 25 mL via INTRAVENOUS
  Filled 2018-08-14: qty 50

## 2018-08-14 MED ORDER — LIP MEDEX EX OINT
TOPICAL_OINTMENT | CUTANEOUS | Status: AC
  Administered 2018-08-14: 16:00:00
  Filled 2018-08-14: qty 7

## 2018-08-14 MED ORDER — BELLADONNA ALKALOIDS-OPIUM 16.2-60 MG RE SUPP: 60.0000 mg | Freq: Four times a day (QID) | RECTAL | Status: DC | PRN

## 2018-08-14 MED ORDER — NALOXONE HCL 0.4 MG/ML IJ SOLN
0.4000 mg | INTRAMUSCULAR | Status: DC | PRN
  Administered 2018-08-14 – 2018-08-15 (×3): 0.4 mg via INTRAVENOUS
  Filled 2018-08-14 (×3): qty 1

## 2018-08-14 MED ORDER — DEXTROSE-NACL 5-0.9 % IV SOLN
INTRAVENOUS | Status: DC
  Administered 2018-08-14: 17:00:00 via INTRAVENOUS

## 2018-08-14 MED ORDER — DEXTROSE 50 % IV SOLN
25.0000 mL | Freq: Once | INTRAVENOUS | Status: AC
  Administered 2018-08-14: 25 mL via INTRAVENOUS

## 2018-08-14 MED ORDER — FENTANYL CITRATE (PF) 100 MCG/2ML IJ SOLN: 12.5000 ug | INTRAMUSCULAR | Status: DC | PRN

## 2018-08-14 MED ORDER — SODIUM ZIRCONIUM CYCLOSILICATE 5 G PO PACK
5.0000 g | PACK | Freq: Once | ORAL | Status: DC
  Filled 2018-08-14: qty 1

## 2018-08-14 MED ORDER — NALOXONE HCL 0.4 MG/ML IJ SOLN
0.4000 mg | Freq: Once | INTRAMUSCULAR | Status: AC
  Administered 2018-08-14: 0.4 mg via INTRAVENOUS
  Filled 2018-08-14: qty 1

## 2018-08-14 NOTE — Progress Notes (Signed)
CBG 41, MD notified, new orders received.  [name]. Brigitte Pulse, RN

## 2018-08-14 NOTE — Progress Notes (Signed)
PHARMACY CONSULT NOTE FOR:  OUTPATIENT  PARENTERAL ANTIBIOTIC THERAPY (OPAT)  Indication: enterococcus, pseudomonas bacteremia Regimen: Zosyn 2.25 g IV q6 hr End date: 08/28/18  **Please note that this stop date is contingent on 9/14 blood cultures remaining clear. If these cultures become positive (which would most likely happen after discharge, antibiotic therapy would need to be extended appropriately. ID has recommended 2 wks of antibiotics starting from first clear BCx**  IV antibiotic discharge orders are pended. To discharging provider:  please sign these orders via discharge navigator,  Select New Orders & click on the button choice - Manage This Unsigned Work.     Thank you for allowing pharmacy to be a part of this patient's care.  Yamil Dougher A 08/14/2018, 12:29 PM

## 2018-08-14 NOTE — Progress Notes (Signed)
Urology Progress Note   1 Day Post-Op s/p palliative TURBT for hemostasis, hx of metastatic progressive bladder cancer  Subjective/Interval: Family at bedside. Patient nonverbal and AMS foley catheter removed early this AM. Per nursing there was a fair amount of bloody discharge per urethra Bilateral PCNs in place draining well No lab work this morning  Objective: Vital signs in last 24 hours: Temp:  [93.6 F (34.2 C)-97 F (36.1 C)] 97 F (36.1 C) (09/13 1800) Pulse Rate:  [49-60] 59 (09/14 0708) Resp:  [13-20] 16 (09/14 0708) BP: (82-112)/(39-76) 112/76 (09/14 0708) SpO2:  [89 %-100 %] 97 % (09/14 1039) Weight:  [75.2 kg] 75.2 kg (09/13 1411)  Intake/Output from previous day: 09/13 0701 - 09/14 0700 In: 64332.9 [I.V.:2166.3; IV Piggyback:300] Out: 51884 [ZYSAY:30160; Blood:5] Intake/Output this shift: Total I/O In: -  Out: 275 [Urine:275]  Physical Exam:  General: not alert or oriented, moving erractically in bed, heavy breathing CV: RRR Lungs: Clear Abdomen: Soft, appropriately tender.  GU: no foley catheter in place Ext: NT, No erythema  Lab Results: Recent Labs    08/12/18 1048  HGB 8.7*  HCT 27.0*   BMET Recent Labs    08/12/18 1048  NA 140  K 4.6  CL 115*  CO2 17*  GLUCOSE 68*  BUN 28*  CREATININE 3.95*  CALCIUM 6.9*     Studies/Results: No results found.  Assessment/Plan:  72 y.o. male w/ progressive metastatic bladder cancer with recently worsening in AMS, pseudomonas bacteremia, acute blood loss anemia from hematuria requiring ongoing blood transfusions. Now with bilateral PCNs in place. Recently s/p palliative TURBT on 08/13/18.   We had a long discussion with family present this morning. We are moving towards palliative measures for this patient and either inpatient or home hospice. There is not much more to offer in terms of local bladder control, particularly as patient doesn't seem to be in much pain from this. As goals of care are  discussed it may be an option to forgo additional transfusions as some point, as well as let bladder clot off if continued bleeding. We will defer to palliative care team from here.   - no further urologic intervention indicated at this time - appreciate medical team assistance and palliative care team for goals of care discussion/hospice - maintain bilateral PCNs  - he may likely continue to have hematuria per urethra, which we would recommend expectant management. Ongoing transfusions at discretion of family and palliative care team    LOS: 3 days   Fredricka Bonine 08/14/2018, 10:54 AM

## 2018-08-14 NOTE — Consult Note (Signed)
Palliative care consult note  Reason for consult: Goals of care in light of advanced cancer  Palliative care consult received.  Chart reviewed including personal review of pertinent labs and imaging.  Discussed case with bedside RN.  Attempted to meet with patient and his family.  Would not arouse on my arrival.  RN noted he was more awake earlier after receiving dose of Narcan.  Ordered additional Narcan PRN and he did arouse after receiving another dose, but still not awake enough to really participate in goals conversation.  I then met with his children outside of the room.  We discussed clinical course as well as wishes moving forward in regard to advanced directives.  His children report that family and trying to get a new house ready to move into are the most important things to him.   We discussed difference between a aggressive medical intervention path and a palliative, comfort focused care path.  Values and goals of care important to patient and family were attempted to be elicited.  Concept of Hospice and Palliative Care were discussed  Questions and concerns addressed.   PMT will continue to support holistically.  - Continue current care for another 24-48 hours to see if he regains ability to participate in goals conversation/ declares himself - Continue Narcan as needed.   - If he no longer requires narcan and pain recurs, plan for low dose fentanyl as needed.   - Plan for f/u tomorrow.  Total time: 85 minutes Greater than 50%  of this time was spent counseling and coordinating care related to the above assessment and plan.  Micheline Rough, MD [place] Team [phone]

## 2018-08-14 NOTE — Progress Notes (Addendum)
PROGRESS NOTE    Carl Murphy  NTI:[id number] DOB: [birth year] DOA: 08/09/2018 PCP: [name], MD   Brief Narrative:  Patient is a 72 year old male with past medical history of bladder cancer, COPD, bilateral hydronephrosis, CKD stage IV, status post bilateral nephrostomy tubes placement who was admitted from home after he presented with complaints of hematuria, headache and weakness.  Upon arrival to the emergency department, he had hemoglobin of 3.7.  Blood cultures that were sent on 08/10/2018 have resulted positive for enterococcus.  Blood culture sent on 08/11/2018 grew Pseudomonas.  ID consulted .  Patient underwent TURBT on 08/13/2018. Assessment & Plan:   Principal Problem:   Enterococcal bacteremia Active Problems:   Malignant neoplasm of trigone of bladder (HCC)   Acute blood loss anemia   Gross hematuria   COPD (chronic obstructive pulmonary disease) (HCC)   Tobacco dependence   CKD (chronic kidney disease)   Bilateral hydronephrosis   Acute renal failure superimposed on stage 4 chronic kidney disease (HCC)   Nephrostomy status ([place]), bilateral   Malnutrition of moderate degree   Bacteremia due to Pseudomonas   Primary bladder malignant neoplasm (HCC)  Polymicrobial bacteremia: Blood culture growing enterococcus and Pseudomonas.  Currently on Zosyn.  ID following.  Repeat blood cultures sent today.  No plan for removal of the Port-A-Cath.  Plan for total of 14 days of antibiotics after first negative blood culture..  Echocardiogram done on 08/10/1989 did not show any vegetations.  Bladder cancer/hematuria: Has history of metastatic muscle invasive bladder cancer .  Status post placement of bilateral nephrostomy tubes in June this year.  Recently started on chemotherapy.  Urology was  following.  Has persistent hematuria with severe anemia .Urology did TURBT for hemostasis  on 08/13/18.  Looks like hematuria is improving.  Altered mental status/encephalopathy: Most likely he was  encephalopathic secondary to high-dose of pain medications on the background of his during his kidney function.  His mental status significantly improved after he was given a dose of Narcan.  Acute blood loss anemia: Secondary to hematuria from bladder cancer.  Status post transfusion.  His hemoglobin is in the range of 8 .  We will continue to monitor his H&H and transfuse as needed.  Hypertension: Blood pressure is currently stable.  Continue current medications.  COPD: Currently stable and on baseline  Acute on chronic kidney disease stage IV-V: Nephrology was following.  Acute  kidney injury suspected to be from acute tubular necrosis/severe anemia with hemodynamic compromise/obstructive issues with bladder cancer.  Improved with IV fluids.  Avoid nephrotoxic agents. His baseline creatinine is around 4.  Debility/deconditioning/poor prognosis/multiple comorbidities: Family requested for palliative care evaluation.  Palliative  care consulted today.   DVT prophylaxis: SCD Code Status: partial code Family Communication: Family members present at the bedside Disposition Plan: Needs to be determined.  Home versus hospice  Consultants: Urology, ID, nephrology  Procedures: None  Antimicrobials: Zosyn  Subjective: Patient seen and examined at bedside this morning.  He was very sedated, sleepy, drowsy.  Hemodynamically stable though.  Mental status  improved after he was given IV Narcan.  Objective: Vitals:   08/13/18 1805 08/13/18 2154 08/14/18 0708 08/14/18 1039  BP: (!) 86/49 (!) 82/39 112/76   Pulse: (!) 54 60 (!) 59   Resp: 18 16 16    Temp:      TempSrc:      SpO2: 95% (!) 89% 90% 97%  Weight:      Height:  Intake/Output Summary (Last 24 hours) at 08/14/2018 1416 Last data filed at 08/14/2018 0820 Gross per 24 hour  Intake 16660 ml  Output 13780 ml  Net 2880 ml   Filed Weights   08/12/18 0500 08/13/18 0708 08/13/18 1411  Weight: 75.8 kg 75.2 kg 75.2 kg     Examination:  General exam: Sleepy, drowsy, lethargic  HEENT:PERRL,Oral mucosa moist, Ear/Nose normal on gross exam Respiratory system: Bilateral equal air entry, normal vesicular breath sounds, no wheezes or crackles  Cardiovascular system: S1 & S2 heard, RRR. No JVD, murmurs, rubs, gallops or clicks. No pedal edema. Gastrointestinal system: Abdomen is nondistended, soft and nontender. No organomegaly or masses felt. Normal bowel sounds heard.  Bilateral nephrostomy tube placement Central nervous system: Not alert or awake Extremities: No edema, no clubbing ,no cyanosis, distal peripheral pulses palpable. Skin: No rashes, lesions or ulcers,no icterus ,no pallor GU: Foley with bladder irrigation   Data Reviewed: I have personally reviewed following labs and imaging studies  CBC: Recent Labs  Lab 08/09/18 1220 08/09/18 1438 08/10/18 0716 08/11/18 0400 08/12/18 1048  WBC 4.5 4.7 4.2 5.6 5.5  NEUTROABS 3.6 3.8  --   --   --   HGB 4.3* 3.9* 9.5* 9.8* 8.7*  HCT 13.2* 12.2* 28.3* 29.3* 27.0*  MCV 96.4 96.1 91.3 90.7 92.8  PLT 182 167 116* 144* 297*   Basic Metabolic Panel: Recent Labs  Lab 08/09/18 1438 08/10/18 0716 08/11/18 0400 08/12/18 1048  NA 136 136 139 140  K 4.8 4.8 4.8 4.6  CL 112* 112* 115* 115*  CO2 17* 18* 19* 17*  GLUCOSE 104* 81 84 68*  BUN 44* 39* 35* 28*  CREATININE 5.11* 4.91* 4.55* 3.95*  CALCIUM 7.0* 6.7* 7.0* 6.9*  PHOS  --   --  3.8  --    GFR: Estimated Creatinine Clearance: 16.8 mL/min (A) (by C-G formula based on SCr of 3.95 mg/dL (H)). Liver Function Tests: Recent Labs  Lab 08/09/18 1438 08/11/18 0400  AST 20  --   ALT 9  --   ALKPHOS 54  --   BILITOT 0.5  --   PROT 5.7*  --   ALBUMIN 2.4* 2.2*   No results for input(s): LIPASE, AMYLASE in the last 168 hours. No results for input(s): AMMONIA in the last 168 hours. Coagulation Profile: Recent Labs  Lab 08/09/18 1438  INR 1.03   Cardiac Enzymes: No results for input(s):  CKTOTAL, CKMB, CKMBINDEX, TROPONINI in the last 168 hours. BNP (last 3 results) No results for input(s): PROBNP in the last 8760 hours. HbA1C: No results for input(s): HGBA1C in the last 72 hours. CBG: No results for input(s): GLUCAP in the last 168 hours. Lipid Profile: No results for input(s): CHOL, HDL, LDLCALC, TRIG, CHOLHDL, LDLDIRECT in the last 72 hours. Thyroid Function Tests: No results for input(s): TSH, T4TOTAL, FREET4, T3FREE, THYROIDAB in the last 72 hours. Anemia Panel: No results for input(s): VITAMINB12, FOLATE, FERRITIN, TIBC, IRON, RETICCTPCT in the last 72 hours. Sepsis Labs: Recent Labs  Lab 08/09/18 1545  LATICACIDVEN 1.6    Recent Results (from the past 240 hour(s))  Culture, blood (Routine x 2)     Status: Abnormal   Collection Time: 08/09/18  2:38 PM  Result Value Ref Range Status   Specimen Description   Final    BLOOD RIGHT ANTECUBITAL Performed at [facility], [address], [place], [place] [address]    Special Requests   Final    BOTTLES DRAWN AEROBIC AND ANAEROBIC  Blood Culture adequate volume Performed at [facility] [place], [place] [address]    Culture  Setup Time   Final    GRAM POSITIVE COCCI IN CHAINS IN BOTH AEROBIC AND ANAEROBIC BOTTLES Gram Stain Report Called to,Read Back By and Verified With: HEARNE,J AT 0630 BY HUFFINES,S ON 08/10/18. CRITICAL VALUE NOTED.  VALUE IS CONSISTENT WITH PREVIOUSLY REPORTED AND CALLED VALUE.    Culture (A)  Final    ENTEROCOCCUS FAECALIS SUSCEPTIBILITIES PERFORMED ON PREVIOUS CULTURE WITHIN THE LAST 5 DAYS. Performed at [facility], [place] [address] [place], [place] [address]    Report Status 08/12/2018 FINAL  Final  Culture, blood (Routine x 2)     Status: Abnormal   Collection Time: 08/09/18  3:45 PM  Result Value Ref Range Status   Specimen Description   Final    BLOOD CENTRAL LINE Performed at [facility], [address] [place], [place] [address]    Special Requests    Final    BOTTLES DRAWN AEROBIC AND ANAEROBIC Blood Culture adequate volume Performed at [facility] [place], [place] [address]    Culture  Setup Time   Final    GRAM POSITIVE COCCI Gram Stain Report Called to,Read Back By and Verified With: HEARN,J.  [id number] ON 08/10/2018 BY EVA IN BOTH AEROBIC AND ANAEROBIC BOTTLES Performed at [place], READ BACK BY AND VERIFIED WITH: Johny Chess RN [id number] 08/10/18 A BROWNING    Culture ENTEROCOCCUS FAECALIS (A)  Final   Report Status 08/12/2018 FINAL  Final   Organism ID, Bacteria ENTEROCOCCUS FAECALIS  Final      Susceptibility   Enterococcus faecalis - MIC*    AMPICILLIN <=2 SENSITIVE Sensitive     VANCOMYCIN 1 SENSITIVE Sensitive     GENTAMICIN SYNERGY SENSITIVE Sensitive     * ENTEROCOCCUS FAECALIS  Blood Culture ID Panel (Reflexed)     Status: Abnormal   Collection Time: 08/09/18  3:45 PM  Result Value Ref Range Status   Enterococcus species DETECTED (A) NOT DETECTED Final    Comment: CRITICAL RESULT CALLED TO, READ BACK BY AND VERIFIED WITH: Johny Chess RN [phone] 08/10/18 A BROWNING    Vancomycin resistance NOT DETECTED NOT DETECTED Final   Listeria monocytogenes NOT DETECTED NOT DETECTED Final   Staphylococcus species NOT DETECTED NOT DETECTED Final   Staphylococcus aureus NOT DETECTED NOT DETECTED Final   Streptococcus species NOT DETECTED NOT DETECTED Final   Streptococcus agalactiae NOT DETECTED NOT DETECTED Final   Streptococcus pneumoniae NOT DETECTED NOT DETECTED Final   Streptococcus pyogenes NOT DETECTED NOT DETECTED Final   Acinetobacter baumannii NOT DETECTED NOT DETECTED Final   Enterobacteriaceae species NOT DETECTED NOT DETECTED Final   Enterobacter cloacae complex NOT DETECTED NOT DETECTED Final   Escherichia coli NOT DETECTED NOT DETECTED Final   Klebsiella oxytoca NOT DETECTED NOT DETECTED Final   Klebsiella pneumoniae NOT DETECTED NOT DETECTED Final   Proteus species NOT  DETECTED NOT DETECTED Final   Serratia marcescens NOT DETECTED NOT DETECTED Final   Haemophilus influenzae NOT DETECTED NOT DETECTED Final   Neisseria meningitidis NOT DETECTED NOT DETECTED Final   Pseudomonas aeruginosa NOT DETECTED NOT DETECTED Final   Candida albicans NOT DETECTED NOT DETECTED Final   Candida glabrata NOT DETECTED NOT DETECTED Final   Candida krusei NOT DETECTED NOT DETECTED Final   Candida parapsilosis NOT DETECTED NOT DETECTED Final   Candida tropicalis NOT DETECTED NOT DETECTED Final  Comment: Performed at [facility], [place] [address], [place], [place] [address]  Culture, Urine     Status: Abnormal   Collection Time: 08/10/18  3:48 AM  Result Value Ref Range Status   Specimen Description   Final    URINE, CLEAN CATCH Performed at [facility], [address] [place], [place] [address]    Special Requests   Final    Normal Performed at [facility], [address], [place], [place] [address]    Culture MULTIPLE SPECIES PRESENT, SUGGEST RECOLLECTION (A)  Final   Report Status 08/12/2018 FINAL  Final  Culture, Urine     Status: Abnormal   Collection Time: 08/10/18 10:15 AM  Result Value Ref Range Status   Specimen Description   Final    URINE, CATHETERIZED Performed at [facility], [address] [place], [place] [address]    Special Requests   Final    NONE Performed at [facility], [address], [place], [place] [address]    Culture MULTIPLE SPECIES PRESENT, SUGGEST RECOLLECTION (A)  Final   Report Status 08/12/2018 FINAL  Final  Culture, blood (Routine X 2) w Reflex to ID Panel     Status: None (Preliminary result)   Collection Time: 08/11/18  2:07 PM  Result Value Ref Range Status   Specimen Description BLOOD LEFT ANTECUBITAL  Final   Special Requests   Final    BOTTLES DRAWN AEROBIC AND ANAEROBIC Blood Culture results may not be optimal due to an excessive volume of blood received in culture bottles   Culture   Final    NO GROWTH 2  DAYS Performed at [facility], [address], [place], [place] [address]    Report Status PENDING  Incomplete  Culture, blood (Routine X 2) w Reflex to ID Panel     Status: Abnormal   Collection Time: 08/11/18  2:07 PM  Result Value Ref Range Status   Specimen Description   Final    BLOOD RIGHT ANTECUBITAL Performed at [facility], [address] [place], [place] [address]    Special Requests   Final    BOTTLES DRAWN AEROBIC AND ANAEROBIC Blood Culture adequate volume Performed at [facility] [place] [place], [place] [address]    Culture  Setup Time   Final    GRAM NEGATIVE RODS RECOVERED FROM THE AEROBIC BOTTLE Gram Stain Report Called to,Read Back By and Verified With: SMITH,J. AT 1110 ON 08/12/2018 BY BAUGHAM,M. Performed at [place] BY AND VERIFIED WITH: E MURPHY RN 08/12/18 1703 JDW Performed at [facility], [place] [address] [place], [place] [address]    Culture PSEUDOMONAS AERUGINOSA (A)  Final   Report Status 08/14/2018 FINAL  Final   Organism ID, Bacteria PSEUDOMONAS AERUGINOSA  Final      Susceptibility   Pseudomonas aeruginosa - MIC*    CEFTAZIDIME 2 SENSITIVE Sensitive     CIPROFLOXACIN <=0.25 SENSITIVE Sensitive     GENTAMICIN <=1 SENSITIVE Sensitive     IMIPENEM 1 SENSITIVE Sensitive     PIP/TAZO <=4 SENSITIVE Sensitive     CEFEPIME <=1 SENSITIVE Sensitive     * PSEUDOMONAS AERUGINOSA  Blood Culture ID Panel (Reflexed)     Status: Abnormal   Collection Time: 08/11/18  2:07 PM  Result Value Ref Range Status   Enterococcus species NOT DETECTED NOT DETECTED Final   Listeria monocytogenes NOT DETECTED NOT DETECTED Final   Staphylococcus species NOT DETECTED  NOT DETECTED Final   Staphylococcus aureus NOT DETECTED NOT DETECTED Final   Streptococcus species NOT DETECTED NOT DETECTED Final   Streptococcus agalactiae NOT DETECTED NOT DETECTED Final   Streptococcus pneumoniae NOT DETECTED NOT DETECTED Final    Streptococcus pyogenes NOT DETECTED NOT DETECTED Final   Acinetobacter baumannii NOT DETECTED NOT DETECTED Final   Enterobacteriaceae species NOT DETECTED NOT DETECTED Final   Enterobacter cloacae complex NOT DETECTED NOT DETECTED Final   Escherichia coli NOT DETECTED NOT DETECTED Final   Klebsiella oxytoca NOT DETECTED NOT DETECTED Final   Klebsiella pneumoniae NOT DETECTED NOT DETECTED Final   Proteus species NOT DETECTED NOT DETECTED Final   Serratia marcescens NOT DETECTED NOT DETECTED Final   Carbapenem resistance NOT DETECTED NOT DETECTED Final   Haemophilus influenzae NOT DETECTED NOT DETECTED Final   Neisseria meningitidis NOT DETECTED NOT DETECTED Final   Pseudomonas aeruginosa DETECTED (A) NOT DETECTED Final    Comment: CRITICAL RESULT CALLED TO, READ BACK BY AND VERIFIED WITH: E MURPHY RN 08/12/18 1703 JDW    Candida albicans NOT DETECTED NOT DETECTED Final   Candida glabrata NOT DETECTED NOT DETECTED Final   Candida krusei NOT DETECTED NOT DETECTED Final   Candida parapsilosis NOT DETECTED NOT DETECTED Final   Candida tropicalis NOT DETECTED NOT DETECTED Final    Comment: Performed at [facility], [place] [address], [place], [place] [address]         Radiology Studies: No results found.      Scheduled Meds: . sodium chloride   Intravenous Once  . carvedilol  3.125 mg Oral BID WC  . chlorhexidine  15 mL Mouth/Throat QID  . citalopram  20 mg Oral Daily  . Influenza vac split quadrivalent PF  0.5 mL Intramuscular Tomorrow-1000  . ipratropium-albuterol  3 mL Nebulization TID  . nicotine  14 mg Transdermal Daily  . sodium chloride flush  10-40 mL Intracatheter Q12H  . sodium chloride flush  3 mL Intravenous Q12H  . sucralfate  1 g Oral TID WC & HS   Continuous Infusions: . sodium chloride 0 mL (08/10/18 1513)  . piperacillin-tazobactam (ZOSYN)  IV 2.25 g (08/14/18 1354)  . sodium chloride irrigation       LOS: 4 days    Time spent: 35 mins.More  than 50% of that time was spent in counseling and/or coordination of care.      Shelly Coss, MD [facility] Pager [phone]  If 7PM-7AM, please contact night-coverage [email or web address] Password [email or web address] 08/14/2018, 2:16 PM

## 2018-08-14 NOTE — Progress Notes (Signed)
MD notified that patient is lethargic and family has questions. Will continue to monitor.

## 2018-08-14 NOTE — Progress Notes (Signed)
Assumed care of patient at 1630. Agree with previous Nurse assessment.  [name]. Brigitte Pulse, RN

## 2018-08-15 ENCOUNTER — Inpatient Hospital Stay (HOSPITAL_COMMUNITY): Payer: Medicare Other

## 2018-08-15 LAB — BASIC METABOLIC PANEL
ANION GAP: 7 (ref 5–15)
BUN: 35 mg/dL — ABNORMAL HIGH (ref 8–23)
CALCIUM: 7 mg/dL — AB (ref 8.9–10.3)
CO2: 18 mmol/L — AB (ref 22–32)
Chloride: 120 mmol/L — ABNORMAL HIGH (ref 98–111)
Creatinine, Ser: 5.07 mg/dL — ABNORMAL HIGH (ref 0.61–1.24)
GFR, EST AFRICAN AMERICAN: 12 mL/min — AB (ref >60)
GFR, EST NON AFRICAN AMERICAN: 10 mL/min — AB (ref >60)
GLUCOSE: 94 mg/dL (ref 70–99)
POTASSIUM: 5.3 mmol/L — AB (ref 3.5–5.1)
Sodium: 145 mmol/L (ref 135–145)

## 2018-08-15 LAB — CBC WITH DIFFERENTIAL/PLATELET
BASOS ABS: 0 10*3/uL (ref 0.0–0.1)
BASOS PCT: 0 %
Eosinophils Absolute: 0 10*3/uL (ref 0.0–0.7)
Eosinophils Relative: 0 %
HEMATOCRIT: 23.4 % — AB (ref 39.0–52.0)
Hemoglobin: 7.7 g/dL — ABNORMAL LOW (ref 13.0–17.0)
LYMPHS PCT: 6 %
Lymphs Abs: 0.5 10*3/uL — ABNORMAL LOW (ref 0.7–4.0)
MCH: 32 pg (ref 26.0–34.0)
MCHC: 32.9 g/dL (ref 30.0–36.0)
MCV: 97.1 fL (ref 78.0–100.0)
Monocytes Absolute: 0.5 10*3/uL (ref 0.1–1.0)
Monocytes Relative: 6 %
NEUTROS ABS: 7.5 10*3/uL (ref 1.7–7.7)
Neutrophils Relative %: 88 %
PLATELETS: 136 10*3/uL — AB (ref 150–400)
RBC: 2.41 MIL/uL — ABNORMAL LOW (ref 4.22–5.81)
RDW: 18.9 % — ABNORMAL HIGH (ref 11.5–15.5)
WBC: 8.5 10*3/uL (ref 4.0–10.5)

## 2018-08-15 LAB — MRSA PCR SCREENING: MRSA by PCR: NEGATIVE

## 2018-08-15 LAB — GLUCOSE, CAPILLARY
GLUCOSE-CAPILLARY: 97 mg/dL (ref 70–99)
Glucose-Capillary: 88 mg/dL (ref 70–99)

## 2018-08-15 MED ORDER — NALOXONE HCL 4 MG/10ML IJ SOLN
0.5000 mg/h | INTRAVENOUS | Status: DC
  Administered 2018-08-15 – 2018-08-16 (×4): 0.5 mg/h via INTRAVENOUS
  Filled 2018-08-15 (×3): qty 10

## 2018-08-15 MED ORDER — ORAL CARE MOUTH RINSE
15.0000 mL | Freq: Two times a day (BID) | OROMUCOSAL | Status: DC
  Administered 2018-08-15: 15 mL via OROMUCOSAL

## 2018-08-15 MED ORDER — FUROSEMIDE 10 MG/ML IJ SOLN: 20.0000 mg | Freq: Every day | INTRAMUSCULAR | Status: DC

## 2018-08-15 MED ORDER — FUROSEMIDE 10 MG/ML IJ SOLN
40.0000 mg | Freq: Once | INTRAMUSCULAR | Status: AC
  Administered 2018-08-15: 40 mg via INTRAVENOUS
  Filled 2018-08-15: qty 4

## 2018-08-15 MED ORDER — SODIUM BICARBONATE 8.4 % IV SOLN
INTRAVENOUS | Status: DC
  Administered 2018-08-15 – 2018-08-17 (×5): via INTRAVENOUS
  Filled 2018-08-15 (×4): qty 150

## 2018-08-15 NOTE — Progress Notes (Signed)
Palliative care progress note  Reason for consult: Goals of care in light of advanced cancer  Discussed overnight events with bedside RN.     I met today multiple times with Carl Murphy and his family to discuss his clinical course and pathways forward.  He received multiple doses of Narcan and was therefore transitioned to Narcan infusion.  He has been much more alert with this intervention.  - Continue current care.  Carl Murphy was able to participate some in conversation today.  I discussed with him and family that while he is more awake, he remains critically ill with multiple complex problems.  They understand that he is in a tenuous situation and even if he improves from bacteremia, he still has high grade cancer with bleeding that is going to recur at some point.  Per his children, the goal is to see how he does over the short term (next day or two) to help with planning for long term.   - His goal has been to live long enough to move into a farm house he has been renovating.  Will continue to progress conversation based upon his clinical course.  I believe that first step is to get to point where he can transition off Narcan, at which point will discuss transition home with hospice vs finish abx course (? If he can go home with home health to finish abx then elect his hospice benefit, but I worry if he will ever recover enough for plan other than hospice care to be realistic if goal is to get home).  Family is clear that home is where he would want to be. - Continue Narcan infusion.  Hopeful that he will clear remaining opioids and be able to be transitioned off tomorrow.   - If he no longer requires narcan and pain recurs, plan for low dose fentanyl as needed.   - Plan for f/u tomorrow.  Total time: 60 minutes Greater than 50% of this time was spent counseling and coordinating care related to the above assessment and plan.  Micheline Rough, MD [place]  Team [phone]

## 2018-08-15 NOTE — Progress Notes (Addendum)
PROGRESS NOTE    Carl Murphy  JHE:[id number] DOB: 04/06/1946 DOA: 08/09/2018 PCP: [name], MD   Brief Narrative:  Patient is a 72 year old male with past medical history of bladder cancer, COPD, bilateral hydronephrosis, CKD stage IV, status post bilateral nephrostomy tubes placement who was admitted from home after he presented with complaints of hematuria, headache and weakness.  Upon arrival to the emergency department, he had hemoglobin of 3.7.  Blood cultures that were sent on 08/10/2018 have resulted positive for enterococcus.  Blood culture sent on 08/11/2018 grew Pseudomonas.  ID consulted .  Patient underwent TURBT on 08/13/2018. Patient's hospital course was remarkable for persistent lethargy most likely secondary to IV pain medications that he received earlier on the background of deranged kidney function.  Palliative care is also following regarding goals of care.  He has been made DNR.  Plan is to transfer to stepdown unit today for narcan drip.  Assessment & Plan:   Principal Problem:   Enterococcal bacteremia Active Problems:   Malignant neoplasm of trigone of bladder (HCC)   Acute blood loss anemia   Gross hematuria   COPD (chronic obstructive pulmonary disease) (HCC)   Tobacco dependence   CKD (chronic kidney disease)   Bilateral hydronephrosis   Acute renal failure superimposed on stage 4 chronic kidney disease (HCC)   Nephrostomy status ([place]), bilateral   Malnutrition of moderate degree   Bacteremia due to Pseudomonas   Primary bladder malignant neoplasm (HCC)  Polymicrobial bacteremia: Blood culture growing enterococcus and Pseudomonas.  Currently on Zosyn.  ID following.  Repeat blood cultures sent ,NGTD.  No plan for removal of the Port-A-Cath.  Plan for total of 14 days of antibiotics after first negative blood culture..  Echocardiogram done on 08/10/1989 did not show any vegetations.  Bladder cancer/hematuria: Has history of metastatic muscle invasive bladder  cancer .Known metastatic lesions on the lungs. status post placement of bilateral nephrostomy tubes in June this year.  Recently started on chemotherapy.  Urology was  following.  Has persistent hematuria with severe anemia .Urology did TURBT for hemostasis  on 08/13/18.  Looks like hematuria is improving.  Altered mental status/encephalopathy: Most likely he was encephalopathic secondary to high-dose of pain medications on the background of his during his kidney function.  His mental status significantly improved after he was given a dose of Narcan.  We will start him on Narcan, move to stepdown unit today.  Acute blood loss anemia: Secondary to hematuria from bladder cancer.  Status post transfusion.  His hemoglobin is in the range of 7-8 .  We will continue to monitor his H&H and transfuse as needed.  Hypertension: Blood pressure is currently stable.  Continue current medications.  COPD: Currently stable and on baseline  Acute on chronic kidney disease stage IV-V: Nephrology was following.  Acute  kidney injury suspected to be from acute tubular necrosis/severe anemia with hemodynamic compromise/obstructive issues with bladder cancer.  Avoid nephrotoxic agents. His baseline creatinine is around 4.  Creatinine went up today.  Started on D5 bicarb.  We will continue to monitor kidney function.  Is not a candidate for dialysis.  Acute respiratory failure/hypoxia: This is complicated by the effect of pain medications he received earlier.  Chest x-ray done today also showed pulmonary edema.  We will give him a dose of Lasix 40 mg IV once.  Continue supplemental oxygen.  Debility/deconditioning/poor prognosis/multiple comorbidities: Family requested for palliative care evaluation.  Palliative  care following.He is DNR now.  Family considering  hospice.   DVT prophylaxis: SCD Code Status: partial code Family Communication: Family members present at the bedside Disposition Plan: Needs to be determined.   Likely hospice Consultants: Urology, ID, nephrology  Procedures: None  Antimicrobials: Zosyn  Subjective: Patient seen and examined the bedside this morning.  Was lethargic and drowsy during my evaluation.  Given a dose of Narcan with improvement in the mental status.  He had episodes of desaturations last night most likely secondary to apnea.  Patient will be moved to stepdown unit for closer monitoring with Narcan drip.  Objective: Vitals:   08/15/18 0645 08/15/18 0807 08/15/18 0817 08/15/18 0933  BP:      Pulse:      Resp:      Temp:      TempSrc:      SpO2: 95% 100% 97% 100%  Weight:      Height:        Intake/Output Summary (Last 24 hours) at 08/15/2018 1047 Last data filed at 08/15/2018 0500 Gross per 24 hour  Intake 829.56 ml  Output 300 ml  Net 529.56 ml   Filed Weights   08/12/18 0500 08/13/18 0708 08/13/18 1411  Weight: 75.8 kg 75.2 kg 75.2 kg    Examination:  General exam: Sleepy, drowsy, lethargic  HEENT:PERRL,Oral mucosa moist, Ear/Nose normal on gross exam Respiratory system: Bilateral coarse breathing sounds.   Cardiovascular system: Bradycardiac, RRR. No JVD, murmurs, rubs, gallops or clicks. No pedal edema. Gastrointestinal system: Abdomen is nondistended, soft and nontender. No organomegaly or masses felt. Normal bowel sounds heard.  Bilateral nephrostomy tube placement Central nervous system: Not alert or awake Extremities: No edema, no clubbing ,no cyanosis, distal peripheral pulses palpable. Skin: No rashes, lesions or ulcers,no icterus ,no pallor GU: Foley with bladder irrigation   Data Reviewed: I have personally reviewed following labs and imaging studies  CBC: Recent Labs  Lab 08/09/18 1220 08/09/18 1438 08/10/18 0716 08/11/18 0400 08/12/18 1048 08/14/18 1518 08/15/18 0400  WBC 4.5 4.7 4.2 5.6 5.5 7.9 8.5  NEUTROABS 3.6 3.8  --   --   --  7.0 7.5  HGB 4.3* 3.9* 9.5* 9.8* 8.7* 7.6* 7.7*  HCT 13.2* 12.2* 28.3* 29.3* 27.0* 23.3*  23.4*  MCV 96.4 96.1 91.3 90.7 92.8 95.9 97.1  PLT 182 167 116* 144* 129* 124* 585*   Basic Metabolic Panel: Recent Labs  Lab 08/10/18 0716 08/11/18 0400 08/12/18 1048 08/14/18 1518 08/15/18 0400  NA 136 139 140 145 145  K 4.8 4.8 4.6 5.3* 5.3*  CL 112* 115* 115* 119* 120*  CO2 18* 19* 17* 16* 18*  GLUCOSE 81 84 68* 47* 94  BUN 39* 35* 28* 35* 35*  CREATININE 4.91* 4.55* 3.95* 4.68* 5.07*  CALCIUM 6.7* 7.0* 6.9* 7.1* 7.0*  PHOS  --  3.8  --   --   --    GFR: Estimated Creatinine Clearance: 13.1 mL/min (A) (by C-G formula based on SCr of 5.07 mg/dL (H)). Liver Function Tests: Recent Labs  Lab 08/09/18 1438 08/11/18 0400  AST 20  --   ALT 9  --   ALKPHOS 54  --   BILITOT 0.5  --   PROT 5.7*  --   ALBUMIN 2.4* 2.2*   No results for input(s): LIPASE, AMYLASE in the last 168 hours. No results for input(s): AMMONIA in the last 168 hours. Coagulation Profile: Recent Labs  Lab 08/09/18 1438  INR 1.03   Cardiac Enzymes: No results for input(s): CKTOTAL, CKMB, CKMBINDEX, TROPONINI in the  last 168 hours. BNP (last 3 results) No results for input(s): PROBNP in the last 8760 hours. HbA1C: No results for input(s): HGBA1C in the last 72 hours. CBG: Recent Labs  Lab 08/14/18 1702 08/14/18 1806 08/15/18 0630  GLUCAP 41* 78 88   Lipid Profile: No results for input(s): CHOL, HDL, LDLCALC, TRIG, CHOLHDL, LDLDIRECT in the last 72 hours. Thyroid Function Tests: No results for input(s): TSH, T4TOTAL, FREET4, T3FREE, THYROIDAB in the last 72 hours. Anemia Panel: No results for input(s): VITAMINB12, FOLATE, FERRITIN, TIBC, IRON, RETICCTPCT in the last 72 hours. Sepsis Labs: Recent Labs  Lab 08/09/18 1545  LATICACIDVEN 1.6    Recent Results (from the past 240 hour(s))  Culture, blood (Routine x 2)     Status: Abnormal   Collection Time: 08/09/18  2:38 PM  Result Value Ref Range Status   Specimen Description   Final    BLOOD RIGHT ANTECUBITAL Performed at [facility], [address], [place], [place] [address]    Special Requests   Final    BOTTLES DRAWN AEROBIC AND ANAEROBIC Blood Culture adequate volume Performed at [facility] [place], [place] [address]    Culture  Setup Time   Final    GRAM POSITIVE COCCI IN CHAINS IN BOTH AEROBIC AND ANAEROBIC BOTTLES Gram Stain Report Called to,Read Back By and Verified With: HEARNE,J AT 0630 BY HUFFINES,S ON 08/10/18. CRITICAL VALUE NOTED.  VALUE IS CONSISTENT WITH PREVIOUSLY REPORTED AND CALLED VALUE.    Culture (A)  Final    ENTEROCOCCUS FAECALIS SUSCEPTIBILITIES PERFORMED ON PREVIOUS CULTURE WITHIN THE LAST 5 DAYS. Performed at [facility], [place] [address], [place], [place] [address]    Report Status 08/12/2018 FINAL  Final  Culture, blood (Routine x 2)     Status: Abnormal   Collection Time: 08/09/18  3:45 PM  Result Value Ref Range Status   Specimen Description   Final    BLOOD CENTRAL LINE Performed at [facility], [address], [place], [place] [address]    Special Requests   Final    BOTTLES DRAWN AEROBIC AND ANAEROBIC Blood Culture adequate volume Performed at [facility], [address] [place], [place] [address]    Culture  Setup Time   Final    GRAM POSITIVE COCCI Gram Stain Report Called to,Read Back By and Verified With: HEARN,J.  [id number] ON 08/10/2018 BY EVA IN BOTH AEROBIC AND ANAEROBIC BOTTLES Performed at [facility], READ BACK BY AND VERIFIED WITH: Johny Chess RN [id number] 08/10/18 A BROWNING    Culture ENTEROCOCCUS FAECALIS (A)  Final   Report Status 08/12/2018 FINAL  Final   Organism ID, Bacteria ENTEROCOCCUS FAECALIS  Final      Susceptibility   Enterococcus faecalis - MIC*    AMPICILLIN <=2 SENSITIVE Sensitive     VANCOMYCIN 1 SENSITIVE Sensitive     GENTAMICIN SYNERGY SENSITIVE Sensitive     * ENTEROCOCCUS FAECALIS  Blood Culture ID Panel (Reflexed)     Status: Abnormal   Collection Time: 08/09/18  3:45 PM  Result Value  Ref Range Status   Enterococcus species DETECTED (A) NOT DETECTED Final    Comment: CRITICAL RESULT CALLED TO, READ BACK BY AND VERIFIED WITH: Johny Chess RN [phone] 08/10/18 A BROWNING    Vancomycin resistance NOT DETECTED NOT DETECTED Final   Listeria monocytogenes NOT DETECTED NOT DETECTED Final   Staphylococcus species NOT DETECTED NOT DETECTED Final   Staphylococcus aureus NOT DETECTED NOT  DETECTED Final   Streptococcus species NOT DETECTED NOT DETECTED Final   Streptococcus agalactiae NOT DETECTED NOT DETECTED Final   Streptococcus pneumoniae NOT DETECTED NOT DETECTED Final   Streptococcus pyogenes NOT DETECTED NOT DETECTED Final   Acinetobacter baumannii NOT DETECTED NOT DETECTED Final   Enterobacteriaceae species NOT DETECTED NOT DETECTED Final   Enterobacter cloacae complex NOT DETECTED NOT DETECTED Final   Escherichia coli NOT DETECTED NOT DETECTED Final   Klebsiella oxytoca NOT DETECTED NOT DETECTED Final   Klebsiella pneumoniae NOT DETECTED NOT DETECTED Final   Proteus species NOT DETECTED NOT DETECTED Final   Serratia marcescens NOT DETECTED NOT DETECTED Final   Haemophilus influenzae NOT DETECTED NOT DETECTED Final   Neisseria meningitidis NOT DETECTED NOT DETECTED Final   Pseudomonas aeruginosa NOT DETECTED NOT DETECTED Final   Candida albicans NOT DETECTED NOT DETECTED Final   Candida glabrata NOT DETECTED NOT DETECTED Final   Candida krusei NOT DETECTED NOT DETECTED Final   Candida parapsilosis NOT DETECTED NOT DETECTED Final   Candida tropicalis NOT DETECTED NOT DETECTED Final    Comment: Performed at [facility], [place] [address], [place], [place] [address]  Culture, Urine     Status: Abnormal   Collection Time: 08/10/18  3:48 AM  Result Value Ref Range Status   Specimen Description   Final    URINE, CLEAN CATCH Performed at [facility], [address], [place], [place] [address]    Special Requests   Final    Normal Performed at [facility], [address], [place], [place] [address]    Culture MULTIPLE SPECIES PRESENT, SUGGEST RECOLLECTION (A)  Final   Report Status 08/12/2018 FINAL  Final  Culture, Urine     Status: Abnormal   Collection Time: 08/10/18 10:15 AM  Result Value Ref Range Status   Specimen Description   Final    URINE, CATHETERIZED Performed at [facility], [address], [place], [place] [address]    Special Requests   Final    NONE Performed at [facility], [address] [place], [place] [address]    Culture MULTIPLE SPECIES PRESENT, SUGGEST RECOLLECTION (A)  Final   Report Status 08/12/2018 FINAL  Final  Culture, blood (Routine X 2) w Reflex to ID Panel     Status: None (Preliminary result)   Collection Time: 08/11/18  2:07 PM  Result Value Ref Range Status   Specimen Description BLOOD LEFT ANTECUBITAL  Final   Special Requests   Final    BOTTLES DRAWN AEROBIC AND ANAEROBIC Blood Culture results may not be optimal due to an excessive volume of blood received in culture bottles   Culture   Final    NO GROWTH 4 DAYS Performed at [facility], [address], [place], [place] [address]    Report Status PENDING  Incomplete  Culture, blood (Routine X 2) w Reflex to ID Panel     Status: Abnormal   Collection Time: 08/11/18  2:07 PM  Result Value Ref Range Status   Specimen Description   Final    BLOOD RIGHT ANTECUBITAL Performed at [facility], [address] [place], [place] [address]    Special Requests   Final    BOTTLES DRAWN AEROBIC AND ANAEROBIC Blood Culture adequate volume Performed at [facility], [address], [place], [place] [address]    Culture  Setup Time   Final    GRAM NEGATIVE RODS RECOVERED FROM THE AEROBIC BOTTLE Gram Stain Report Called to,Read Back By  and Verified With: SMITH,J. AT 1110 ON 08/12/2018 BY BAUGHAM,M. Performed at [place] BY AND VERIFIED WITH: E MURPHY RN 08/12/18 1703 JDW Performed at [facility], [place] [address],  [place], [place] [address]    Culture PSEUDOMONAS AERUGINOSA (A)  Final   Report Status 08/14/2018 FINAL  Final   Organism ID, Bacteria PSEUDOMONAS AERUGINOSA  Final      Susceptibility   Pseudomonas aeruginosa - MIC*    CEFTAZIDIME 2 SENSITIVE Sensitive     CIPROFLOXACIN <=0.25 SENSITIVE Sensitive     GENTAMICIN <=1 SENSITIVE Sensitive     IMIPENEM 1 SENSITIVE Sensitive     PIP/TAZO <=4 SENSITIVE Sensitive     CEFEPIME <=1 SENSITIVE Sensitive     * PSEUDOMONAS AERUGINOSA  Blood Culture ID Panel (Reflexed)     Status: Abnormal   Collection Time: 08/11/18  2:07 PM  Result Value Ref Range Status   Enterococcus species NOT DETECTED NOT DETECTED Final   Listeria monocytogenes NOT DETECTED NOT DETECTED Final   Staphylococcus species NOT DETECTED NOT DETECTED Final   Staphylococcus aureus NOT DETECTED NOT DETECTED Final   Streptococcus species NOT DETECTED NOT DETECTED Final   Streptococcus agalactiae NOT DETECTED NOT DETECTED Final   Streptococcus pneumoniae NOT DETECTED NOT DETECTED Final   Streptococcus pyogenes NOT DETECTED NOT DETECTED Final   Acinetobacter baumannii NOT DETECTED NOT DETECTED Final   Enterobacteriaceae species NOT DETECTED NOT DETECTED Final   Enterobacter cloacae complex NOT DETECTED NOT DETECTED Final   Escherichia coli NOT DETECTED NOT DETECTED Final   Klebsiella oxytoca NOT DETECTED NOT DETECTED Final   Klebsiella pneumoniae NOT DETECTED NOT DETECTED Final   Proteus species NOT DETECTED NOT DETECTED Final   Serratia marcescens NOT DETECTED NOT DETECTED Final   Carbapenem resistance NOT DETECTED NOT DETECTED Final   Haemophilus influenzae NOT DETECTED NOT DETECTED Final   Neisseria meningitidis NOT DETECTED NOT DETECTED Final   Pseudomonas aeruginosa DETECTED (A) NOT DETECTED Final    Comment: CRITICAL RESULT CALLED TO, READ BACK BY AND VERIFIED WITH: E MURPHY RN 08/12/18 1703 JDW    Candida albicans NOT DETECTED NOT DETECTED Final   Candida glabrata NOT DETECTED  NOT DETECTED Final   Candida krusei NOT DETECTED NOT DETECTED Final   Candida parapsilosis NOT DETECTED NOT DETECTED Final   Candida tropicalis NOT DETECTED NOT DETECTED Final    Comment: Performed at [facility], [address] [place], [place] [address]         Radiology Studies: Dg Chest Port 1 View  Result Date: 08/15/2018 CLINICAL DATA:  Hypoxia and shortness of breath EXAM: PORTABLE CHEST 1 VIEW COMPARISON:  08/09/2018 FINDINGS: Cardiac shadow is stable. Aortic calcifications are again seen. Right chest wall port is again noted and stable. The lungs are well aerated bilaterally. Bilateral pleural effusions are noted left greater than right. Some vascular congestion with interstitial change consistent with edema is noted as well. The known pulmonary masses are seen but less well visualized due to the superimposed edema. IMPRESSION: Changes consistent with congestive failure. Bilateral effusions are noted. Stable appearing masses bilaterally. Electronically Signed   By: Inez Catalina M.D.   On: 08/15/2018 10:37        Scheduled Meds: . sodium chloride   Intravenous Once  . carvedilol  3.125 mg Oral BID WC  . chlorhexidine  15 mL Mouth/Throat QID  . citalopram  20 mg Oral Daily  . Influenza vac split quadrivalent  PF  0.5 mL Intramuscular Tomorrow-1000  . ipratropium-albuterol  3 mL Nebulization TID  . nicotine  14 mg Transdermal Daily  . sodium chloride flush  10-40 mL Intracatheter Q12H  . sodium chloride flush  3 mL Intravenous Q12H  . sodium zirconium cyclosilicate  5 g Oral Once  . sucralfate  1 g Oral TID WC & HS   Continuous Infusions: . sodium chloride 250 mL (08/15/18 0945)  . naLOXone [facility]) adult infusion for OVERDOSE    . piperacillin-tazobactam (ZOSYN)  IV 2.25 g (08/15/18 0947)  .  sodium bicarbonate  infusion 1000 mL 100 mL/hr at 08/15/18 0941     LOS: 5 days    Time spent: 25 mins.More than 50% of that time was spent in counseling and/or  coordination of care.      Shelly Coss, MD [facility] Pager [phone]  If 7PM-7AM, please contact night-coverage [email or web address] Password [facility] 08/15/2018, 10:47 AM

## 2018-08-15 NOTE — Progress Notes (Signed)
Patient lethargic, arousable with sternal rub. Oxygen saturation ranging from 50-60s on 4L O2. HR -51. Last rectal temp 94. Placed non-breather and O2 saturation increased to 96. Respiratory therapist assessed patient and removed non-rebreather and placed patient on high flow oxygen at 6L and sats maintained in 90s. MD notified. New orders received. Will continue to closely monitor.  [name]. Brigitte Pulse, RN

## 2018-08-15 NOTE — Progress Notes (Signed)
Report given to ICU- RN and patient transferred to unit. Family updated on POC.  [name]. Brigitte Pulse, RN

## 2018-08-15 NOTE — Progress Notes (Signed)
      INFECTIOUS DISEASE ATTENDING ADDENDUM:   Date: 08/15/2018  Patient name: Carl Murphy  Medical record number: [id number]  Date of birth: 1946/06/05   I followed up on cultures for my partner Dr. Megan Salon and current therapy with zosyn is appropriate  Dr. Megan Salon was comfortable forgoing removal of the port since patient and family's desire was for him to be at home and clinical situation is not compatible with long term survival.  He appears to have done worse.  I would continue zosyn for now while he is still wanting active medical treatment other than pallative care but I would STRONGLY advocate for pure palliative approach based on what I can see clinically.  I will sign off from our group.  Please call us back if we are needed. Dr. Megan Salon will be back tomorrow and can be re-engaged if needed but I think pt is is more than capable hands.   Alcide Evener 08/15/2018, 1:38 PM

## 2018-08-16 LAB — BASIC METABOLIC PANEL
Anion gap: 8 (ref 5–15)
BUN: 35 mg/dL — ABNORMAL HIGH (ref 8–23)
CHLORIDE: 113 mmol/L — AB (ref 98–111)
CO2: 20 mmol/L — ABNORMAL LOW (ref 22–32)
Calcium: 6.6 mg/dL — ABNORMAL LOW (ref 8.9–10.3)
Creatinine, Ser: 5.6 mg/dL — ABNORMAL HIGH (ref 0.61–1.24)
GFR calc Af Amer: 11 mL/min — ABNORMAL LOW (ref >60)
GFR calc non Af Amer: 9 mL/min — ABNORMAL LOW (ref >60)
GLUCOSE: 116 mg/dL — AB (ref 70–99)
POTASSIUM: 4.2 mmol/L (ref 3.5–5.1)
Sodium: 141 mmol/L (ref 135–145)

## 2018-08-16 LAB — CBC WITH DIFFERENTIAL/PLATELET
Basophils Absolute: 0 10*3/uL (ref 0.0–0.1)
Basophils Relative: 0 %
Eosinophils Absolute: 0 10*3/uL (ref 0.0–0.7)
Eosinophils Relative: 0 %
HEMATOCRIT: 18.1 % — AB (ref 39.0–52.0)
HEMOGLOBIN: 6 g/dL — AB (ref 13.0–17.0)
LYMPHS ABS: 0.5 10*3/uL — AB (ref 0.7–4.0)
LYMPHS PCT: 9 %
MCH: 30.8 pg (ref 26.0–34.0)
MCHC: 33.1 g/dL (ref 30.0–36.0)
MCV: 92.8 fL (ref 78.0–100.0)
Monocytes Absolute: 0.4 10*3/uL (ref 0.1–1.0)
Monocytes Relative: 6 %
NEUTROS ABS: 4.9 10*3/uL (ref 1.7–7.7)
NEUTROS PCT: 85 %
Platelets: 121 10*3/uL — ABNORMAL LOW (ref 150–400)
RBC: 1.95 MIL/uL — AB (ref 4.22–5.81)
RDW: 18.5 % — ABNORMAL HIGH (ref 11.5–15.5)
WBC: 5.8 10*3/uL (ref 4.0–10.5)

## 2018-08-16 LAB — CULTURE, BLOOD (ROUTINE X 2): Culture: NO GROWTH

## 2018-08-16 LAB — PREPARE RBC (CROSSMATCH)

## 2018-08-16 LAB — HEMOGLOBIN AND HEMATOCRIT, BLOOD
HEMATOCRIT: 26.3 % — AB (ref 39.0–52.0)
Hemoglobin: 8.9 g/dL — ABNORMAL LOW (ref 13.0–17.0)

## 2018-08-16 MED ORDER — SODIUM CHLORIDE 0.9% IV SOLUTION
Freq: Once | INTRAVENOUS | Status: AC
  Administered 2018-08-16: 07:00:00 via INTRAVENOUS

## 2018-08-16 MED ORDER — FENTANYL CITRATE (PF) 100 MCG/2ML IJ SOLN
12.5000 ug | INTRAMUSCULAR | Status: DC | PRN
  Administered 2018-08-16: 12.5 ug via INTRAVENOUS
  Administered 2018-08-17 (×4): 25 ug via INTRAVENOUS
  Filled 2018-08-16 (×5): qty 2

## 2018-08-16 NOTE — Care Management Note (Signed)
Case Management Note  Patient Details  Name: Carl Murphy MRN: [id number] Date of Birth: June 17, 1946  Subjective/Objective:                  End stage CHF  Action/Plan: Continues on o2 via [place], Iv Narcan,Iv zosyn, Iv h3co3, hgb-6.0,bun-35,creat.=5.60  Expected Discharge Date:                  Expected Discharge Plan:  [place]  In-House Referral:     Discharge planning Services  CM Consult  Post Acute Care Choice:    Choice offered to:     DME Arranged:    DME Agency:     HH Arranged:    HH Agency:     Status of Service:  In process, will continue to follow  If discussed at Long Length of Stay Meetings, dates discussed:    Additional Comments:  [name], RN 08/16/2018, 8:46 AM

## 2018-08-16 NOTE — Progress Notes (Signed)
PROGRESS NOTE    Carl Murphy  JGG:[id number] DOB: 03/01/1946 DOA: 08/09/2018 PCP: [name], MD   Brief Narrative:  Patient is a 72 year old male with past medical history of bladder cancer, COPD, bilateral hydronephrosis, CKD stage IV, status post bilateral nephrostomy tubes placement who was admitted from home after he presented with complaints of hematuria, headache and weakness.  Upon arrival to the emergency department, he had hemoglobin of 3.7.  Blood cultures that were sent on 08/10/2018 have resulted positive for enterococcus.  Blood culture sent on 08/11/2018 grew Pseudomonas.  ID consulted .  Patient underwent TURBT on 08/13/2018. Patient's hospital course was remarkable for persistent lethargy most likely secondary to IV pain medications that he received earlier on the background of deranged kidney function.  Palliative care is also following regarding goals of care.  He has been made DNR.  He was moved to stepdown unit  for narcan drip.  Assessment & Plan:   Principal Problem:   Enterococcal bacteremia Active Problems:   Malignant neoplasm of trigone of bladder (HCC)   Acute blood loss anemia   Gross hematuria   COPD (chronic obstructive pulmonary disease) (HCC)   Tobacco dependence   CKD (chronic kidney disease)   Bilateral hydronephrosis   Acute renal failure superimposed on stage 4 chronic kidney disease (HCC)   Nephrostomy status ([place]), bilateral   Malnutrition of moderate degree   Bacteremia due to Pseudomonas   Primary bladder malignant neoplasm (HCC)  Polymicrobial bacteremia: Blood culture growing enterococcus and Pseudomonas.  Currently on Zosyn.  ID following.  Repeat blood cultures sent ,NGTD.  No plan for removal of the Port-A-Cath.  Plan for total of 14 days of antibiotics after first negative blood culture.  Echocardiogram done on 08/10/1989 did not show any vegetations.  Bladder cancer/hematuria: Has history of metastatic muscle invasive bladder cancer .Known  metastatic lesions on the lungs. status post placement of bilateral nephrostomy tubes in June this year.  Recently started on chemotherapy.  Urology was  following.  Has persistent hematuria with severe anemia .Urology did TURBT for hemostasis  on 08/13/18.   Altered mental status/encephalopathy: Most likely he was encephalopathic secondary to high-dose of pain medications on the background of his during his kidney function.  His mental status significantly improved after he was given a dose of Narcan.  He was moved to stepdown for Narcan drip yesterday.  We will hold Narcan drip now.  His mental status has slightly improved since yesterday.  Acute blood loss anemia: Secondary to hematuria from bladder cancer.  Status post transfusions.  His hemoglobin again dropped to 6 today.  He is being transfused with 2 units of PRBC.  Hypertension: Blood pressure is currently stable.  Continue current medications.  COPD: Currently stable and on baseline  Acute on chronic kidney disease stage IV-V: Worsening kidney function.Acute  kidney injury suspected to be from acute tubular necrosis/severe anemia with hemodynamic compromise/obstructive issues with bladder cancer.  Avoid nephrotoxic agents. His baseline creatinine is around 4.  Creatinine went up today.  On D5 bicarb.  I have discussed extensively with the patient's son and daughter about his worsening kidney function.  They are not interested by evaluation by nephrology at present  as they are leaning towards hospice.he is not a dialysis candidate.  Acute respiratory failure/hypoxia: This is complicated by the effect of pain medications he received earlier.  Chest x-ray doneshowed pulmonary edema.  We was given a dose of Lasix 40 mg IV once.  Continue supplemental oxygen.  Debility/deconditioning/poor prognosis/multiple comorbidities: Family requested for palliative care evaluation.  Palliative  care following.He is DNR now.  Family considering hospice.  Will  wait for further discussion by palliative care with family today.   DVT prophylaxis: SCD Code Status: partial code Family Communication: Family members present at the bedside Disposition Plan: Needs to be determined.  Likely hospice Consultants: Urology, ID, nephrology  Procedures: None  Antimicrobials: Zosyn  Subjective: Patient seen and examined the bedside this morning.  Remains hemodynamically stable.  Mental status has slightly improved  today but he easily falls asleep during the conversation.  Objective: Vitals:   08/16/18 1041 08/16/18 1054 08/16/18 1100 08/16/18 1110  BP: (!) 156/65 (!) 146/68  (!) 141/67  Pulse: 65 65 64   Resp: 15 (!) 21 17   Temp: 98.2 F (36.8 C) 98.4 F (36.9 C)  97.8 F (36.6 C)  TempSrc: Oral Oral  Oral  SpO2: (!) 89% 97% 99%   Weight:      Height:        Intake/Output Summary (Last 24 hours) at 08/16/2018 1127 Last data filed at 08/16/2018 1100 Gross per 24 hour  Intake 1438.58 ml  Output 2025 ml  Net -586.42 ml   Filed Weights   08/13/18 0708 08/13/18 1411 08/15/18 1208  Weight: 75.2 kg 75.2 kg 78.7 kg    Examination:  General exam: Lethargic, sleepy, chronically ill looking HEENT:PERRL,Oral mucosa moist, Ear/Nose normal on gross exam Respiratory system: Bilateral coarse breathing sounds  cardiovascular system: S1 & S2 heard, RRR. No JVD, murmurs, rubs, gallops or clicks. Gastrointestinal system: Abdomen is nondistended, soft and nontender. No organomegaly or masses felt. Normal bowel sounds heard.  Bilateral nephrostomy tubes. Central nervous system: Not alert and oriented.  Extremities: No edema, no clubbing ,no cyanosis, distal peripheral pulses palpable. Skin: No rashes, lesions or ulcers,no icterus ,no pallor    Data Reviewed: I have personally reviewed following labs and imaging studies  CBC: Recent Labs  Lab 08/09/18 1220 08/09/18 1438  08/11/18 0400 08/12/18 1048 08/14/18 1518 08/15/18 0400 08/16/18 0330  WBC  4.5 4.7   < > 5.6 5.5 7.9 8.5 5.8  NEUTROABS 3.6 3.8  --   --   --  7.0 7.5 4.9  HGB 4.3* 3.9*   < > 9.8* 8.7* 7.6* 7.7* 6.0*  HCT 13.2* 12.2*   < > 29.3* 27.0* 23.3* 23.4* 18.1*  MCV 96.4 96.1   < > 90.7 92.8 95.9 97.1 92.8  PLT 182 167   < > 144* 129* 124* 136* 121*   < > = values in this interval not displayed.   Basic Metabolic Panel: Recent Labs  Lab 08/11/18 0400 08/12/18 1048 08/14/18 1518 08/15/18 0400 08/16/18 0330  NA 139 140 145 145 141  K 4.8 4.6 5.3* 5.3* 4.2  CL 115* 115* 119* 120* 113*  CO2 19* 17* 16* 18* 20*  GLUCOSE 84 68* 47* 94 116*  BUN 35* 28* 35* 35* 35*  CREATININE 4.55* 3.95* 4.68* 5.07* 5.60*  CALCIUM 7.0* 6.9* 7.1* 7.0* 6.6*  PHOS 3.8  --   --   --   --    GFR: Estimated Creatinine Clearance: 11.9 mL/min (A) (by C-G formula based on SCr of 5.6 mg/dL (H)). Liver Function Tests: Recent Labs  Lab 08/09/18 1438 08/11/18 0400  AST 20  --   ALT 9  --   ALKPHOS 54  --   BILITOT 0.5  --   PROT 5.7*  --   ALBUMIN 2.4* 2.2*  No results for input(s): LIPASE, AMYLASE in the last 168 hours. No results for input(s): AMMONIA in the last 168 hours. Coagulation Profile: Recent Labs  Lab 08/09/18 1438  INR 1.03   Cardiac Enzymes: No results for input(s): CKTOTAL, CKMB, CKMBINDEX, TROPONINI in the last 168 hours. BNP (last 3 results) No results for input(s): PROBNP in the last 8760 hours. HbA1C: No results for input(s): HGBA1C in the last 72 hours. CBG: Recent Labs  Lab 08/14/18 1702 08/14/18 1806 08/15/18 0630 08/15/18 1250  GLUCAP 41* 78 88 97   Lipid Profile: No results for input(s): CHOL, HDL, LDLCALC, TRIG, CHOLHDL, LDLDIRECT in the last 72 hours. Thyroid Function Tests: No results for input(s): TSH, T4TOTAL, FREET4, T3FREE, THYROIDAB in the last 72 hours. Anemia Panel: No results for input(s): VITAMINB12, FOLATE, FERRITIN, TIBC, IRON, RETICCTPCT in the last 72 hours. Sepsis Labs: Recent Labs  Lab 08/09/18 1545  LATICACIDVEN 1.6      Recent Results (from the past 240 hour(s))  Culture, blood (Routine x 2)     Status: Abnormal   Collection Time: 08/09/18  2:38 PM  Result Value Ref Range Status   Specimen Description   Final    BLOOD RIGHT ANTECUBITAL Performed at [facility], [address], [place], [place] [address]    Special Requests   Final    BOTTLES DRAWN AEROBIC AND ANAEROBIC Blood Culture adequate volume Performed at [facility] [place], [place] [address]    Culture  Setup Time   Final    GRAM POSITIVE COCCI IN CHAINS IN BOTH AEROBIC AND ANAEROBIC BOTTLES Gram Stain Report Called to,Read Back By and Verified With: HEARNE,J AT 0630 BY HUFFINES,S ON 08/10/18. CRITICAL VALUE NOTED.  VALUE IS CONSISTENT WITH PREVIOUSLY REPORTED AND CALLED VALUE.    Culture (A)  Final    ENTEROCOCCUS FAECALIS SUSCEPTIBILITIES PERFORMED ON PREVIOUS CULTURE WITHIN THE LAST 5 DAYS. Performed at [facility], [place] [address] [place], [place] [address]    Report Status 08/12/2018 FINAL  Final  Culture, blood (Routine x 2)     Status: Abnormal   Collection Time: 08/09/18  3:45 PM  Result Value Ref Range Status   Specimen Description   Final    BLOOD CENTRAL LINE Performed at [facility], [address] [place], [place] [address]    Special Requests   Final    BOTTLES DRAWN AEROBIC AND ANAEROBIC Blood Culture adequate volume Performed at [facility], [address] [place], [place] [address]    Culture  Setup Time   Final    GRAM POSITIVE COCCI Gram Stain Report Called to,Read Back By and Verified With: HEARN,J.  [id number] ON 08/10/2018 BY EVA IN BOTH AEROBIC AND ANAEROBIC BOTTLES Performed at [facility], READ BACK BY AND VERIFIED WITH: Johny Chess RN [id number] 08/10/18 A BROWNING    Culture ENTEROCOCCUS FAECALIS (A)  Final   Report Status 08/12/2018 FINAL  Final   Organism ID, Bacteria ENTEROCOCCUS FAECALIS  Final      Susceptibility   Enterococcus faecalis - MIC*     AMPICILLIN <=2 SENSITIVE Sensitive     VANCOMYCIN 1 SENSITIVE Sensitive     GENTAMICIN SYNERGY SENSITIVE Sensitive     * ENTEROCOCCUS FAECALIS  Blood Culture ID Panel (Reflexed)     Status: Abnormal   Collection Time: 08/09/18  3:45 PM  Result Value Ref Range Status   Enterococcus species DETECTED (A) NOT DETECTED Final    Comment: CRITICAL RESULT  CALLED TO, READ BACK BY AND VERIFIED WITH: Johny Chess RN [phone] 08/10/18 A BROWNING    Vancomycin resistance NOT DETECTED NOT DETECTED Final   Listeria monocytogenes NOT DETECTED NOT DETECTED Final   Staphylococcus species NOT DETECTED NOT DETECTED Final   Staphylococcus aureus NOT DETECTED NOT DETECTED Final   Streptococcus species NOT DETECTED NOT DETECTED Final   Streptococcus agalactiae NOT DETECTED NOT DETECTED Final   Streptococcus pneumoniae NOT DETECTED NOT DETECTED Final   Streptococcus pyogenes NOT DETECTED NOT DETECTED Final   Acinetobacter baumannii NOT DETECTED NOT DETECTED Final   Enterobacteriaceae species NOT DETECTED NOT DETECTED Final   Enterobacter cloacae complex NOT DETECTED NOT DETECTED Final   Escherichia coli NOT DETECTED NOT DETECTED Final   Klebsiella oxytoca NOT DETECTED NOT DETECTED Final   Klebsiella pneumoniae NOT DETECTED NOT DETECTED Final   Proteus species NOT DETECTED NOT DETECTED Final   Serratia marcescens NOT DETECTED NOT DETECTED Final   Haemophilus influenzae NOT DETECTED NOT DETECTED Final   Neisseria meningitidis NOT DETECTED NOT DETECTED Final   Pseudomonas aeruginosa NOT DETECTED NOT DETECTED Final   Candida albicans NOT DETECTED NOT DETECTED Final   Candida glabrata NOT DETECTED NOT DETECTED Final   Candida krusei NOT DETECTED NOT DETECTED Final   Candida parapsilosis NOT DETECTED NOT DETECTED Final   Candida tropicalis NOT DETECTED NOT DETECTED Final    Comment: Performed at [facility], [address], [place], [place] [address]  Culture, Urine     Status: Abnormal   Collection Time:  08/10/18  3:48 AM  Result Value Ref Range Status   Specimen Description   Final    URINE, CLEAN CATCH Performed at [facility], [address], [place], [place] [address]    Special Requests   Final    Normal Performed at [facility], [address] [place], [place] [address]    Culture MULTIPLE SPECIES PRESENT, SUGGEST RECOLLECTION (A)  Final   Report Status 08/12/2018 FINAL  Final  Culture, Urine     Status: Abnormal   Collection Time: 08/10/18 10:15 AM  Result Value Ref Range Status   Specimen Description   Final    URINE, CATHETERIZED Performed at [facility], [address] [place], [place] [address]    Special Requests   Final    NONE Performed at [facility], [address], [place], [place] [address]    Culture MULTIPLE SPECIES PRESENT, SUGGEST RECOLLECTION (A)  Final   Report Status 08/12/2018 FINAL  Final  Culture, blood (Routine X 2) w Reflex to ID Panel     Status: None   Collection Time: 08/11/18  2:07 PM  Result Value Ref Range Status   Specimen Description BLOOD LEFT ANTECUBITAL  Final   Special Requests   Final    BOTTLES DRAWN AEROBIC AND ANAEROBIC Blood Culture results may not be optimal due to an excessive volume of blood received in culture bottles   Culture   Final    NO GROWTH 5 DAYS Performed at [facility], [address] [place], [place] [address]    Report Status 08/16/2018 FINAL  Final  Culture, blood (Routine X 2) w Reflex to ID Panel     Status: Abnormal   Collection Time: 08/11/18  2:07 PM  Result Value Ref Range Status   Specimen Description   Final    BLOOD RIGHT ANTECUBITAL Performed at [facility], [address], [place], [place] [address]    Special Requests   Final  BOTTLES DRAWN AEROBIC AND ANAEROBIC Blood Culture adequate volume Performed at [facility], [address], [place], [place] [address]    Culture  Setup Time   Final    GRAM NEGATIVE RODS RECOVERED FROM THE AEROBIC BOTTLE Gram Stain Report Called to,Read Back By  and Verified With: SMITH,J. AT 1517 ON 08/12/2018 BY BAUGHAM,M. Performed at [place] [facility], READ BACK BY AND VERIFIED WITH: E MURPHY RN 08/12/18 1703 JDW Performed at [facility], [place] [address] [place], [place] [address]    Culture PSEUDOMONAS AERUGINOSA (A)  Final   Report Status 08/14/2018 FINAL  Final   Organism ID, Bacteria PSEUDOMONAS AERUGINOSA  Final      Susceptibility   Pseudomonas aeruginosa - MIC*    CEFTAZIDIME 2 SENSITIVE Sensitive     CIPROFLOXACIN <=0.25 SENSITIVE Sensitive     GENTAMICIN <=1 SENSITIVE Sensitive     IMIPENEM 1 SENSITIVE Sensitive     PIP/TAZO <=4 SENSITIVE Sensitive     CEFEPIME <=1 SENSITIVE Sensitive     * PSEUDOMONAS AERUGINOSA  Blood Culture ID Panel (Reflexed)     Status: Abnormal   Collection Time: 08/11/18  2:07 PM  Result Value Ref Range Status   Enterococcus species NOT DETECTED NOT DETECTED Final   Listeria monocytogenes NOT DETECTED NOT DETECTED Final   Staphylococcus species NOT DETECTED NOT DETECTED Final   Staphylococcus aureus NOT DETECTED NOT DETECTED Final   Streptococcus species NOT DETECTED NOT DETECTED Final   Streptococcus agalactiae NOT DETECTED NOT DETECTED Final   Streptococcus pneumoniae NOT DETECTED NOT DETECTED Final   Streptococcus pyogenes NOT DETECTED NOT DETECTED Final   Acinetobacter baumannii NOT DETECTED NOT DETECTED Final   Enterobacteriaceae species NOT DETECTED NOT DETECTED Final   Enterobacter cloacae complex NOT DETECTED NOT DETECTED Final   Escherichia coli NOT DETECTED NOT DETECTED Final   Klebsiella oxytoca NOT DETECTED NOT DETECTED Final   Klebsiella pneumoniae NOT DETECTED NOT DETECTED Final   Proteus species NOT DETECTED NOT DETECTED Final   Serratia marcescens NOT DETECTED NOT DETECTED Final   Carbapenem resistance NOT DETECTED NOT DETECTED Final   Haemophilus influenzae NOT DETECTED NOT DETECTED Final   Neisseria meningitidis NOT DETECTED NOT DETECTED Final    Pseudomonas aeruginosa DETECTED (A) NOT DETECTED Final    Comment: CRITICAL RESULT CALLED TO, READ BACK BY AND VERIFIED WITH: E MURPHY RN 08/12/18 1703 JDW    Candida albicans NOT DETECTED NOT DETECTED Final   Candida glabrata NOT DETECTED NOT DETECTED Final   Candida krusei NOT DETECTED NOT DETECTED Final   Candida parapsilosis NOT DETECTED NOT DETECTED Final   Candida tropicalis NOT DETECTED NOT DETECTED Final    Comment: Performed at [facility], [address] [place], [place] [address]  Culture, blood (routine x 2)     Status: None (Preliminary result)   Collection Time: 08/14/18  6:52 AM  Result Value Ref Range Status   Specimen Description   Final    BLOOD LEFT ANTECUBITAL Performed at [facility] [address] [place], [place] [address]    Special Requests   Final    BOTTLES DRAWN AEROBIC ONLY Blood Culture adequate volume Performed at [facility] [address], [place], [place] [address]    Culture   Final    NO GROWTH 2 DAYS Performed at [facility] [address] [place], [place] [address]    Report Status PENDING  Incomplete  Culture, blood (  routine x 2)     Status: None (Preliminary result)   Collection Time: 08/14/18  6:52 AM  Result Value Ref Range Status   Specimen Description   Final    BLOOD RIGHT ANTECUBITAL Performed at [facility] [address], [place], [place] [address]    Special Requests   Final    BOTTLES DRAWN AEROBIC ONLY Blood Culture adequate volume Performed at [facility] [address] [place], [place] [address]    Culture   Final    NO GROWTH 2 DAYS Performed at [facility] [address] [place], [place] [address]    Report Status PENDING  Incomplete  MRSA PCR Screening     Status: None   Collection Time: 08/15/18 12:15 PM  Result Value Ref Range Status   MRSA by PCR NEGATIVE NEGATIVE Final    Comment:        The GeneXpert  MRSA Assay (FDA approved for NASAL specimens only), is one component of a comprehensive MRSA colonization surveillance program. It is not intended to diagnose MRSA infection nor to guide or monitor treatment for MRSA infections. Performed at [facility], [place] [address] [place], [place] [address]          Radiology Studies: Dg Chest Port 1 View  Result Date: 08/15/2018 CLINICAL DATA:  Hypoxia and shortness of breath EXAM: PORTABLE CHEST 1 VIEW COMPARISON:  08/09/2018 FINDINGS: Cardiac shadow is stable. Aortic calcifications are again seen. Right chest wall port is again noted and stable. The lungs are well aerated bilaterally. Bilateral pleural effusions are noted left greater than right. Some vascular congestion with interstitial change consistent with edema is noted as well. The known pulmonary masses are seen but less well visualized due to the superimposed edema. IMPRESSION: Changes consistent with congestive failure. Bilateral effusions are noted. Stable appearing masses bilaterally. Electronically Signed   By: Inez Catalina M.D.   On: 08/15/2018 10:37        Scheduled Meds: . chlorhexidine  15 mL Mouth/Throat QID  . Influenza vac split quadrivalent PF  0.5 mL Intramuscular Tomorrow-1000  . ipratropium-albuterol  3 mL Nebulization TID  . mouth rinse  15 mL Mouth Rinse BID  . sodium chloride flush  10-40 mL Intracatheter Q12H  . sodium chloride flush  3 mL Intravenous Q12H   Continuous Infusions: . sodium chloride 250 mL (08/15/18 1600)  . piperacillin-tazobactam (ZOSYN)  IV Stopped (08/16/18 0919)  .  sodium bicarbonate  infusion 1000 mL 75 mL/hr at 08/16/18 0836     LOS: 2 days    Time spent: 25 mins.More than 50% of that time was spent in counseling and/or coordination of care.      Shelly Coss, MD [facility] Pager [phone]  If 7PM-7AM, please contact night-coverage [email or web address] Password [email or web address] 08/16/2018, 11:27 AM

## 2018-08-16 NOTE — Progress Notes (Signed)
Palliative care progress note  Reason for consult: Goals of care in light of advanced cancer  Discussed overnight events with bedside RN.     I met today with Mr. Goldwater and his family to discuss his clinical course and pathways forward.  Reviewed events of last 24 hours.  He has now been transitioned off Narcan infusion.  He has had continued bleeding and is now s/p 2 units PRBCs.  His creatinine continues to rise (5.6 today).  Note from ID with recommendation for comfort care noted.  - Continue current care but family is considering transition to comfort care with possible residential hospice.  Mr. Heitz was minimally able to participate in conversation today.  His mental status is worse today, but suspect this is from disease progression/renal failure.   - I discussed with his children today (he was too lethargic to participate) that he continues to worsen.  Discussed Cr and progressive renal failure.  His children report understanding his situation and reports that he has been telling them today that he is dying.   - His goal has been to live long enough to move into a farm house he has been renovating.  Family reports that today is the first day that he has not asked about going home and when asked about this by his children, he stated that he was dying and did not want to go home. - Family had been preparing to discuss transition home with hospice, but now feel that they need to discuss further with patient if possible.  We discussed possibility of residential hospice (from [facility]) today.  Family would like to discuss this evening. - If pain recurs, plan for low dose fentanyl as needed.  Avoid further morphine.  - Plan for f/u from member of PMT tomorrow.  Total time: 60 minutes Greater than 50% of this time was spent counseling and coordinating care related to the above assessment and plan.  Micheline Rough, MD [facility] Team [phone]

## 2018-08-17 DIAGNOSIS — G893 Neoplasm related pain (acute) (chronic): Secondary | ICD-10-CM

## 2018-08-17 DIAGNOSIS — Z515 Encounter for palliative care: Secondary | ICD-10-CM

## 2018-08-17 LAB — TYPE AND SCREEN
ABO/RH(D): O POS
Antibody Screen: NEGATIVE
Unit division: 0
Unit division: 0

## 2018-08-17 LAB — CBC WITH DIFFERENTIAL/PLATELET
BASOS PCT: 0 %
Basophils Absolute: 0 10*3/uL (ref 0.0–0.1)
EOS ABS: 0 10*3/uL (ref 0.0–0.7)
Eosinophils Relative: 0 %
HCT: 27.9 % — ABNORMAL LOW (ref 39.0–52.0)
HEMOGLOBIN: 9.5 g/dL — AB (ref 13.0–17.0)
Lymphocytes Relative: 8 %
Lymphs Abs: 0.5 10*3/uL — ABNORMAL LOW (ref 0.7–4.0)
MCH: 30.8 pg (ref 26.0–34.0)
MCHC: 34.1 g/dL (ref 30.0–36.0)
MCV: 90.6 fL (ref 78.0–100.0)
Monocytes Absolute: 0.5 10*3/uL (ref 0.1–1.0)
Monocytes Relative: 7 %
NEUTROS ABS: 6.2 10*3/uL (ref 1.7–7.7)
NEUTROS PCT: 85 %
Platelets: 121 10*3/uL — ABNORMAL LOW (ref 150–400)
RBC: 3.08 MIL/uL — AB (ref 4.22–5.81)
RDW: 16.9 % — ABNORMAL HIGH (ref 11.5–15.5)
WBC: 7.2 10*3/uL (ref 4.0–10.5)

## 2018-08-17 LAB — BPAM RBC
Blood Product Expiration Date: 201910112359
Blood Product Expiration Date: 201910112359
ISSUE DATE / TIME: 201909160654
ISSUE DATE / TIME: 201909161049
Unit Type and Rh: 5100
Unit Type and Rh: 5100

## 2018-08-17 LAB — BASIC METABOLIC PANEL
ANION GAP: 10 (ref 5–15)
BUN: 33 mg/dL — ABNORMAL HIGH (ref 8–23)
CHLORIDE: 108 mmol/L (ref 98–111)
CO2: 27 mmol/L (ref 22–32)
Calcium: 6.9 mg/dL — ABNORMAL LOW (ref 8.9–10.3)
Creatinine, Ser: 5.19 mg/dL — ABNORMAL HIGH (ref 0.61–1.24)
GFR, EST AFRICAN AMERICAN: 12 mL/min — AB (ref >60)
GFR, EST NON AFRICAN AMERICAN: 10 mL/min — AB (ref >60)
Glucose, Bld: 106 mg/dL — ABNORMAL HIGH (ref 70–99)
POTASSIUM: 3.8 mmol/L (ref 3.5–5.1)
SODIUM: 145 mmol/L (ref 135–145)

## 2018-08-17 MED ORDER — CARVEDILOL 12.5 MG PO TABS
25.0000 mg | ORAL_TABLET | Freq: Two times a day (BID) | ORAL | Status: DC
  Filled 2018-08-17: qty 2

## 2018-08-17 MED ORDER — PIPERACILLIN-TAZOBACTAM IV (FOR PTA / DISCHARGE USE ONLY): 2.2500 g | Freq: Four times a day (QID) | INTRAVENOUS | 0 refills | Status: AC

## 2018-08-17 MED ORDER — IPRATROPIUM-ALBUTEROL 0.5-2.5 (3) MG/3ML IN SOLN: 3.0000 mL | Freq: Four times a day (QID) | RESPIRATORY_TRACT | Status: DC

## 2018-08-17 MED ORDER — AMLODIPINE BESYLATE 5 MG PO TABS
5.0000 mg | ORAL_TABLET | Freq: Every day | ORAL | Status: DC
  Filled 2018-08-17: qty 1

## 2018-08-17 NOTE — Progress Notes (Signed)
Pharmacy Antibiotic Note  Carl Murphy is a 72 y.o. male admitted on 08/09/2018 with enterococcus, pseudomonas bacteremia.  Pharmacy has been consulted for Zosyn dosing.  Plan:  Continue Zosyn 2.25 g IV q6 hr  OPAT orders completed for IV abx at discharge.  End date: 08/28/18   Height: 5\' 8"  (172.7 cm) Weight: 168 lb 10.4 oz (76.5 kg) IBW/kg (Calculated) : 68.4  Temp (24hrs), Avg:98.1 F (36.7 C), Min:97.7 F (36.5 C), Max:98.3 F (36.8 C)  Recent Labs  Lab 08/12/18 1048 08/14/18 1518 08/15/18 0400 08/16/18 0330 08/17/18 0319  WBC 5.5 7.9 8.5 5.8 7.2  CREATININE 3.95* 4.68* 5.07* 5.60* 5.19*    Estimated Creatinine Clearance: 12.8 mL/min (A) (by C-G formula based on SCr of 5.19 mg/dL (H)).    Allergies  Allergen Reactions  . Carboplatin Shortness Of Breath    Reaction occurred [facility] left [facility]. Pt reported extreme sob after leaving [facility] that eventually resolved on its own.   Antimicrobials this admission: Ampicillin 9/10>>9/12 Zosyn 9/12 >>    Dose adjustments this admission:  Microbiology results: 9/9 BCx2: 2/2 Enterococcus Faecalis (pans sens) FINAL 9/10 ucx: multi species, need recollection FINAL 9/11 BCx2: 1/2 GNR (BCID= PsA)  Thank you for allowing pharmacy to be a part of this patient's care.  Gretta Arab PharmD, BCPS Pager [phone] 08/17/2018 12:52 PM

## 2018-08-17 NOTE — Progress Notes (Signed)
Report called to [facility], [place]. Peripheral IV removed, Port-a-cath remained accessed per nurse at hospice home. Waiting for [facility] for transport.

## 2018-08-17 NOTE — Progress Notes (Addendum)
CSW confirmed bed with [facility]. Patient can transfer today.  D/C Summary sent to [phone] Nurse call report [phone] [facility] called to transport. DNR signed/ Med. Nes. Form complete.   Kathrin Greathouse, Marlinda Mike, MSW Clinical Social Worker  [phone] 08/17/2018  4:49 PM

## 2018-08-17 NOTE — Discharge Summary (Signed)
Physician Discharge Summary  JAMERIUS BOECKMAN FBP:[id number] DOB: September 08, 1946 DOA: 08/09/2018  PCP: [name], MD  Admit date: 08/09/2018 Discharge date: 08/17/2018  Admitted From: Home Disposition:  Residential hospice  Discharge Condition:Stable CODE STATUS:DNR Diet recommendation: Heart Healthy   Brief/Interim Summary: Patient is a 72 year old male with past medical history of bladder cancer, COPD, bilateral hydronephrosis, CKD stage IV, status post bilateral nephrostomy tubes placement who was admitted from home after he presented with complaints of hematuria, headache and weakness.  Upon arrival to the emergency department, he had hemoglobin of 3.7.  Blood cultures that were sent on 08/10/2018 have resulted positive for enterococcus.  Blood culture sent on 08/11/2018 grew Pseudomonas.  ID consulted .  Patient underwent TURBT on 08/13/2018. Patient's hospital course was remarkable for persistent lethargy most likely secondary to IV pain medications that he received earlier on the background of deranged kidney function.  Palliative care was also following regarding goals of care.  He has been made DNR.  Patient remained critically ill.  Due to his incurable medical illnesses and worsening overall status, family decided on moving him to residential hospice.  He can be discharged to residential hospice as soon as the bed is available.   Following problems were addressed during his hospitalization:  Polymicrobial bacteremia: Blood culture growing enterococcus and Pseudomonas.  Currently on Zosyn.  ID was  following.  Repeat blood cultures sent ,NGTD.  No plan for removal of the Port-A-Cath.  Initial plan was to continue total of 14 days of antibiotics after first negative blood culture.  Echocardiogram done on 08/10/1989 did not show any vegetations.  He is being moved to residential hospice so he may not be continued on antibiotics anymore.  Bladder cancer/hematuria: Has history of metastatic muscle  invasive bladder cancer .Known metastatic lesions on the lungs. status post placement of bilateral nephrostomy tubes in June this year.  Recently started on chemotherapy.  Urology was  following.  Has persistent hematuria with severe anemia .Urology did TURBT for hemostasis  on 08/13/18.   Altered mental status/encephalopathy: Most likely he was encephalopathic secondary to high-dose of pain medications on the background of his during his kidney function.  His mental status significantly improved after he was given a dose of Narcan.  He was moved to stepdown for Narcan drip .  We have held Narcan drip now. His mental status waxes and wanes.   Acute blood loss anemia: Secondary to hematuria from bladder cancer. Status post transfusions.  His hemoglobin is 8.5 today.  Hypertension:   Continue current medications.Noted to be hypertensive this morning.  COPD: Currently stable and on baseline  Acute on chronic kidney disease stage IV-V: Worsening kidney function.Acute  kidney injury suspected to be from acute tubular necrosis/severe anemia with hemodynamic compromise/obstructive issues with bladder cancer.  Avoid nephrotoxic agents. His baseline creatinine is around 4.    On D5 bicarb.  I have discussed extensively with the patient's son and daughter about his worsening kidney function.  They are not interested by evaluation by nephrology at present.He is not a dialysis candidate.  Acute respiratory failure/hypoxia: Currently he is saturating fine on supplemental oxygen  Chest x-ray doneshowed pulmonary edema.  We was given a dose of Lasix 40 mg IV once.  Continue supplemental oxygen.  Debility/deconditioning/poor prognosis/multiple comorbidities: Family requested for palliative care evaluation.  Palliative  care was following.He is DNR now. Plan is to move him to residential hospice as soon as the bed is available.    Discharge Diagnoses:  Principal Problem:   Enterococcal bacteremia Active  Problems:   Malignant neoplasm of trigone of urinary bladder (HCC)   Acute blood loss anemia   Gross hematuria   COPD (chronic obstructive pulmonary disease) (HCC)   Tobacco dependence   CKD (chronic kidney disease)   Bilateral hydronephrosis   Acute renal failure superimposed on stage 4 chronic kidney disease (HCC)   Nephrostomy status ([place]), bilateral   Malnutrition of moderate degree   Bacteremia due to Pseudomonas   Primary bladder malignant neoplasm (HCC)   Pain, cancer   Palliative care by specialist    Discharge Instructions  Discharge Instructions    Diet - low sodium heart healthy   Complete by:  As directed    Home infusion instructions [facility] May follow [place] Dosing Protocol; May administer Cathflo as needed to maintain patency of vascular access device.; Flushing of vascular access device: per [facility] Protocol: 0.9% NaCl pre/post medica...   Complete by:  As directed    Instructions:  May follow [place] Dosing Protocol   Instructions:  May administer Cathflo as needed to maintain patency of vascular access device.   Instructions:  Flushing of vascular access device: per [facility] Protocol: 0.9% NaCl pre/post medication administration and prn patency; Heparin 100 u/ml, 62m for implanted ports and Heparin 10u/ml, 555mfor all other central venous catheters.   Instructions:  May follow [facility] Anaphylaxis Protocol for First Dose Administration in the home: 0.9% NaCl at 25-50 ml/hr to maintain IV access for protocol meds. Epinephrine 0.3 ml IV/IM PRN and Benadryl 25-50 IV/IM PRN s/s of anaphylaxis.   Instructions:  [facility] [place] Coordinator (RN) to assist per patient IV care needs in the home PRN.     Allergies as of 08/17/2018      Reactions   Carboplatin Shortness Of Breath   Reaction occurred after patient left [facility]. Pt reported extreme sob after leaving CC that eventually resolved on its own.      Medication List    TAKE these  medications   acetaminophen 500 MG tablet Commonly known as:  TYLENOL Take 1,000 mg by mouth every 6 (six) hours as needed for mild pain or moderate pain (PRN for pain).   amLODipine 5 MG tablet Commonly known as:  NORVASC Take 5 mg by mouth daily.   belladonna-opium 16.2-30 MG suppository Commonly known as:  B&O SUPPRETTES Place 1 suppository rectally every 12 hours as needed for pain What changed:    how much to take  how to take this   carvedilol 25 MG tablet Commonly known as:  COREG Take 25 mg by mouth 2 (two) times daily.   cetirizine 10 MG tablet Commonly known as:  ZYRTEC Take 10 mg by mouth daily.   citalopram 20 MG tablet Commonly known as:  CELEXA Take 20 mg by mouth daily.   ENSURE Take 237 mLs by mouth once a week.   fluticasone 50 MCG/ACT nasal spray Commonly known as:  FLONASE Place 2 sprays into both nostrils daily as needed for allergies or rhinitis.   ipratropium-albuterol 0.5-2.5 (3) MG/3ML Soln Commonly known as:  DUONEB Take 3 mLs by nebulization every 6 (six) hours as needed.   lidocaine 2 % solution Commonly known as:  XYLOCAINE Use as directed 15 mLs in the mouth or throat every 6 (six) hours as needed for mouth pain.   oxybutynin 5 MG tablet Commonly known as:  DITROPAN Take 1 tablet (5 mg total) by mouth every 6 (  six) hours as needed for bladder spasms.   piperacillin-tazobactam  IVPB Commonly known as:  ZOSYN Inject 2.25 g into the vein every 6 (six) hours for 14 days. Indication:  Enterococcal, pseudomonal bacteremia Last Day of Therapy:  08/28/18 Labs - Once weekly:  CBC/D and BMP, Labs - Every other week:  ESR and CRP   sucralfate 1 GM/10ML suspension Commonly known as:  CARAFATE Carafate 1gm/64m and Viscous lidocaine 2% 1:1 mixture. Swish and swallow 1 tablespoon four times a day What changed:    how to take this  when to take this   VENTOLIN HFA 108 (90 Base) MCG/ACT inhaler Generic drug:  albuterol Inhale 1-2 puffs  into the lungs every 4 (four) hours as needed for wheezing or shortness of breath.            Home Infusion Instuctions  (From admission, onward)         Start     Ordered   08/17/18 0000  Home infusion instructions [facility] May follow [place]; May administer Cathflo as needed to maintain patency of vascular access device.; Flushing [facility] vascular access device[facility]: [facility]% NaCl pre/post medica...    Question Answer Comment  Instructions May follow [place]   Instructions May administer Cathflo as needed to maintain patency of vascular access device.   Instructions Flushing of vascular access device: [facility]: 0.9% NaCl pre/post medication administration and prn patency; Heparin 100 u/ml, 559mfor implanted ports and Heparin 10u/ml, 56m2mor all other central venous catheters.   Instructions May follow [facility] Anaphylaxis Protocol for First Dose Administration in the home: 0.9% NaCl at 25-50 ml/hr to maintain IV access for protocol meds. Epinephrine 0.3 ml IV/IM PRN and Benadryl 25-50 IV/IM PRN s/s of anaphylaxis.   Instructions [facility] Infusion Coordinator (RN) to assist per patient IV care needs in the home PRN.      08/17/18 1554          Allergies  Allergen Reactions  . Carboplatin Shortness Of Breath    Reaction occurred after patient left Cancer Center. Pt reported extreme sob after leaving CC that eventually resolved on its own.    Consultations: Urology, palliative care, ID  Procedures/Studies: Ct Abdomen Pelvis Wo Contrast  Result Date: 07/25/2018 CLINICAL DATA:  Invasive bladder cancer EXAM: CT ABDOMEN AND PELVIS WITHOUT CONTRAST TECHNIQUE: Multidetector CT imaging of the abdomen and pelvis was performed following the standard protocol without IV contrast. COMPARISON:  06/07/2018 FINDINGS: Lower chest: 1.8 cm nodule/metastasis in the anterior left upper lobe, previously 1.9 cm. Small bilateral  pleural effusions. Emphysematous changes Cardiomegaly. Small pericardial effusion. Three vessel coronary atherosclerosis. Hepatobiliary: Unenhanced liver is unremarkable. Gallbladder is unremarkable. No intrahepatic or extrahepatic ductal dilatation. Pancreas: Within normal limits. Spleen: Within normal limits. Adrenals/Urinary Tract: Mild thickening of the bilateral renal glands. Right renal cortical scarring/atrophy. Indwelling percutaneous nephrostomy catheter. Stable mild soft tissue fullness of the right sacral ureter (coronal image 56), poorly evaluated on unenhanced CT. No hydronephrosis. Left percutaneous nephrostomy catheter. Left renal vascular calcifications. Mild fullness of the left renal collecting system without frank hydronephrosis. Irregular bladder thickening. Layering posterior hyperdensity suggests hemorrhage (series 2/image 76). Possible calcified lesion on the left (series 2/image 74). Stomach/Bowel: Stomach is within normal limits. No evidence of bowel obstruction. Normal appendix (series 2/image 62). Sigmoid diverticulosis, without evidence of diverticulitis. Vascular/Lymphatic: No evidence of abdominal aortic aneurysm. Atherosclerotic calcifications of the abdominal aorta and branch vessels. No suspicious  abdominopelvic lymphadenopathy. Reproductive: Prostate is grossly unremarkable. Other: Mild perihepatic ascites. No pelvic ascites. No frank peritoneal nodularity. Small fat containing periumbilical hernia (series 2/image 56). Musculoskeletal: Visualized osseous structures are within normal limits. IMPRESSION: Irregular bladder thickening with layering posterior hemorrhage and possible calcified bladder lesion in this patient with known bladder cancer. Stable soft tissue fullness of the right sacral ureter, poorly evaluated on unenhanced CT, upper tract disease not excluded. Indwelling bilateral percutaneous nephrostomy catheters. Fullness of the left renal collecting system without frank  hydronephrosis. 1.8 cm left upper lobe pulmonary metastasis, incompletely visualized. Small bilateral pleural effusions. Additional ancillary findings as above. Electronically Signed   By: Julian Hy M.D.   On: 07/25/2018 14:46   Dg Chest 2 View  Result Date: 08/09/2018 CLINICAL DATA:  Possible sepsis, short of breath and weak EXAM: CHEST - 2 VIEW COMPARISON:  07/24/2018, CT chest 06/07/2018 FINDINGS: Right-sided central venous port tip over the proximal right atrium. 3 cm left upper lobe lung mass. Increased size of right perihilar mass, measuring 3.1 cm. Increased conspicuity right upper lobe lung mass. No pleural effusion. Hyperinflation with emphysematous disease. Normal heart size with aortic atherosclerosis. No pneumothorax. IMPRESSION: 1. Increased size and conspicuity of right-sided lung masses as compared to prior radiograph. Stable left upper lobe lung mass. 2. Hyperinflation with emphysematous disease. 3. No acute pulmonary infiltrate. Electronically Signed   By: Donavan Foil M.D.   On: 08/09/2018 14:55   Dg Abdomen 1 View  Result Date: 07/24/2018 CLINICAL DATA:  Urinating blood. Patient reports right nephrostomy tube has come out. EXAM: ABDOMEN - 1 VIEW COMPARISON:  CT scan June 07, 2018 FINDINGS: The right nephrostomy tube history ane Jan configuration. There is now a kink in the distal tube suggesting it is at least partially pulled out. The left nephrostomy tube appears to be in expected location. No other acute abnormalities. IMPRESSION: Findings suggesting the right nephrostomy tube has been at least partially pulled out of the renal collecting system. Electronically Signed   By: Dorise Bullion III M.D   On: 07/24/2018 16:05   US Renal  Result Date: 08/10/2018 CLINICAL DATA:  72 year old male with acute on chronic renal failure. Bilateral nephrostomy tubes. TURP. Bladder cancer. Subsequent encounter. EXAM: RENAL / URINARY TRACT ULTRASOUND COMPLETE COMPARISON:  08/03/2018 and  07/25/2018. FINDINGS: Right Kidney: Length: 9.2 cm. Increased echogenicity with renal parenchymal thinning. No hydronephrosis. Nephrostomy tube in place. Left Kidney: Length: 10.6 cm. Echogenicity within normal limits. No hydronephrosis. Nephrostomies tube in place. Bladder: Decompressed by catheter.  Bladder wall thickening. IMPRESSION: 1. No hydronephrosis.  Nephrostomy tubes in place. 2. Increased echogenicity and renal parenchymal thinning of the right kidney. 3. Decompressed urinary bladder with catheter in place. Bladder wall thickening. Please see 07/25/2018 CT report. Electronically Signed   By: Genia Del M.D.   On: 08/10/2018 17:54   Dg Chest Port 1 View  Result Date: 08/15/2018 CLINICAL DATA:  Hypoxia and shortness of breath EXAM: PORTABLE CHEST 1 VIEW COMPARISON:  08/09/2018 FINDINGS: Cardiac shadow is stable. Aortic calcifications are again seen. Right chest wall port is again noted and stable. The lungs are well aerated bilaterally. Bilateral pleural effusions are noted left greater than right. Some vascular congestion with interstitial change consistent with edema is noted as well. The known pulmonary masses are seen but less well visualized due to the superimposed edema. IMPRESSION: Changes consistent with congestive failure. Bilateral effusions are noted. Stable appearing masses bilaterally. Electronically Signed   By: Linus Mako.D.  On: 08/15/2018 10:37   Dg Chest Port 1 View  Result Date: 07/24/2018 CLINICAL DATA:  Bladder cancer metastatic the lungs. EXAM: PORTABLE CHEST 1 VIEW COMPARISON:  CT scan June 07, 2018 FINDINGS: The known mass in the left upper lobe is again identified. Other smaller nodules on the recent CT scan are not appreciated on this study. A right Port-A-Cath is stable. No pneumothorax. Mild cardiomegaly. No other interval changes. IMPRESSION: Known mass in the left upper lobe. Other known pulmonary metastases not well visualized on this chest x-ray. No acute  abnormalities. Electronically Signed   By: Dorise Bullion III M.D   On: 07/24/2018 16:01   Ir Nephrostomy Exchange Left  Result Date: 08/03/2018 INDICATION: 72 year old male with a history of percutaneous nephrostomy for ureteral obstruction EXAM: IR EXCHANGE NEPHROSTOMY LEFT; IR EXCHANGE NEPHROSTOMY RIGHT COMPARISON:  07/25/2018, 05/20/2018, 03/25/2018 MEDICATIONS: None ANESTHESIA/SEDATION: None CONTRAST:  37m ISOVUE-300 IOPAMIDOL (ISOVUE-300) INJECTION 61% - administered into the collecting system(s) FLUOROSCOPY TIME:  Fluoroscopy Time: 1 minutes 18 seconds (10 mGy). COMPLICATIONS: None PROCEDURE: Informed written consent was obtained from the patient after a thorough discussion of the procedural risks, benefits and alternatives. All questions were addressed. Maximal Sterile Barrier Technique was utilized including caps, mask, sterile gowns, sterile gloves, sterile drape, hand hygiene and skin antiseptic. A timeout was performed prior to the initiation of the procedure. Patient position prone position on the fluoroscopy table. The bilateral flank and indwelling drains were prepped and draped in the usual sterile fashion. On the left, images were stored and sent to PACs. Contrast was infused through the left drain into the left collecting system. 1% lidocaine was used for local anesthesia. Modified Seldinger technique was then used to replace the left-sided drain with a new 10 [place]ur[place] catheter placed. Catheter was sutured in position and attached to gravity drainage. On the right, contrast was infused through the right drain into the right collecting system confirming location. Images were stored and sent to PACs. 1% lidocaine was used for local anesthesia. Modified Seldinger technique was used to exchange the right-sided PCN with placement of a new 10 French catheter. Patient tolerated the procedure well and remained hemodynamically stable throughout. No complications were encountered and no  significant blood loss. IMPRESSION: Status post routine exchange of bilateral percutaneous nephrostomy with placement of new 10 French drains. Signed, JDulcy Fanny WDellia Nims RPVI [facility] CenterRadiology Electronically Signed   By: JCorrie MckusickD.O.   On: 08/03/2018 14:11   Ir Nephrostomy Exchange Right  Result Date: 08/03/2018 INDICATION: 72year old male with a history of percutaneous nephrostomy for ureteral obstruction EXAM: IR EXCHANGE NEPHROSTOMY LEFT; IR EXCHANGE NEPHROSTOMY RIGHT COMPARISON:  07/25/2018, 05/20/2018, 03/25/2018 MEDICATIONS: None ANESTHESIA/SEDATION: None CONTRAST:  144mISOVUE-300 IOPAMIDOL (ISOVUE-300) INJECTION 61% - administered into the collecting system(s) FLUOROSCOPY TIME:  Fluoroscopy Time: 1 minutes 18 seconds (10 mGy). COMPLICATIONS: None PROCEDURE: Informed written consent was obtained from the patient after a thorough discussion of the procedural risks, benefits and alternatives. All questions were addressed. Maximal Sterile Barrier Technique was utilized including caps, mask, sterile gowns, sterile gloves, sterile drape, hand hygiene and skin antiseptic. A timeout was performed prior to the initiation of the procedure. Patient position prone position on the fluoroscopy table. The bilateral flank and indwelling drains were prepped and draped in the usual sterile fashion. On the left, images were stored and sent to PACs. Contrast was infused through the left drain into the left collecting system. 1% lidocaine was used for local anesthesia.  Modified Seldinger technique was then used to replace the left-sided drain with a new 10 [place] multipurpose catheter placed. Catheter was sutured in position and attached to gravity drainage. On the right, contrast was infused through the right drain into the right collecting system confirming location. Images were stored and sent to PACs. 1% lidocaine was used for local anesthesia. Modified  Seldinger technique was used to exchange the right-sided PCN with placement of a new 10 French catheter. Patient tolerated the procedure well and remained hemodynamically stable throughout. No complications were encountered and no significant blood loss. IMPRESSION: Status post routine exchange of bilateral percutaneous nephrostomy with placement of new 10 French drains. Signed, Dulcy Fanny. Dellia Nims, RPVI [facility] Radiology Electronically Signed   By: Corrie Mckusick D.O.   On: 08/03/2018 14:11      Subjective: Patient seen and examined the bedside this morning.  Family were present at the bedside.  Family interested in hospice.  He looks very weak and lethargic.  Falls  to sleep frequently.  Discharge Exam: Vitals:   08/17/18 1510 08/17/18 1600  BP: (!) 185/84 (!) 179/95  Pulse: (!) 58 61  Resp:    Temp:    SpO2: 97% 91%   Vitals:   08/17/18 1400 08/17/18 1500 08/17/18 1510 08/17/18 1600  BP:   (!) 185/84 (!) 179/95  Pulse: 68 60 (!) 58 61  Resp:      Temp:      TempSrc:      SpO2: 98% 98% 97% 91%  Weight:      Height:        General: Terminally ill,weak,lethargic Cardiovascular: RRR, S1/S2 +, no rubs, no gallops Respiratory: Coarse breathing sounds Abdominal: Soft, NT, ND, bowel sounds + Extremities: no edema, no cyanosis    The results of significant diagnostics from this hospitalization (including imaging, microbiology, ancillary and laboratory) are listed below for reference.     Microbiology: Recent Results (from the past 240 hour(s))  Culture, blood (Routine x 2)     Status: Abnormal   Collection Time: 08/09/18  2:38 PM  Result Value Ref Range Status   Specimen Description   Final    BLOOD RIGHT ANTECUBITAL Performed at [facility], [address], [place], [place] [address]    Special Requests   Final    BOTTLES DRAWN AEROBIC AND ANAEROBIC Blood Culture adequate volume Performed at [place] [place], [place] [address]    Culture  Setup Time   Final    GRAM POSITIVE COCCI IN CHAINS IN BOTH AEROBIC AND ANAEROBIC BOTTLES Gram Stain Report Called to,Read Back By and Verified With: HEARNE,J AT 0630 BY HUFFINES,S ON 08/10/18. CRITICAL VALUE NOTED.  VALUE IS CONSISTENT WITH PREVIOUSLY REPORTED AND CALLED VALUE.    Culture (A)  Final    ENTEROCOCCUS FAECALIS SUSCEPTIBILITIES PERFORMED ON PREVIOUS CULTURE WITHIN THE LAST 5 DAYS. Performed at [facility], [place] [address] [place], [place] [address]    Report Status 08/12/2018 FINAL  Final  Culture, blood (Routine x 2)     Status: Abnormal   Collection Time: 08/09/18  3:45 PM  Result Value Ref Range Status   Specimen Description   Final    BLOOD CENTRAL LINE Performed at [facility], [address], [place], [place] [address]    Special Requests   Final    BOTTLES DRAWN AEROBIC AND ANAEROBIC Blood Culture adequate volume Performed at [facility], [address] [place],  [place] [address]    Culture  Setup Time   Final    GRAM POSITIVE COCCI Gram Stain Report Called to,Read Back By and Verified With: HEARN,J.  [id number] ON 08/10/2018 BY EVA IN BOTH AEROBIC AND ANAEROBIC BOTTLES Performed at [facility], READ BACK BY AND VERIFIED WITH: Johny Chess RN [id number] 08/10/18 A BROWNING    Culture ENTEROCOCCUS FAECALIS (A)  Final   Report Status 08/12/2018 FINAL  Final   Organism ID, Bacteria ENTEROCOCCUS FAECALIS  Final      Susceptibility   Enterococcus faecalis - MIC*    AMPICILLIN <=2 SENSITIVE Sensitive     VANCOMYCIN 1 SENSITIVE Sensitive     GENTAMICIN SYNERGY SENSITIVE Sensitive     * ENTEROCOCCUS FAECALIS  Blood Culture ID Panel (Reflexed)     Status: Abnormal   Collection Time: 08/09/18  3:45 PM  Result Value Ref Range Status   Enterococcus species DETECTED (A) NOT DETECTED Final    Comment: CRITICAL RESULT CALLED TO, READ BACK BY AND VERIFIED WITH: Johny Chess RN [phone] 08/10/18 A BROWNING     Vancomycin resistance NOT DETECTED NOT DETECTED Final   Listeria monocytogenes NOT DETECTED NOT DETECTED Final   Staphylococcus species NOT DETECTED NOT DETECTED Final   Staphylococcus aureus NOT DETECTED NOT DETECTED Final   Streptococcus species NOT DETECTED NOT DETECTED Final   Streptococcus agalactiae NOT DETECTED NOT DETECTED Final   Streptococcus pneumoniae NOT DETECTED NOT DETECTED Final   Streptococcus pyogenes NOT DETECTED NOT DETECTED Final   Acinetobacter baumannii NOT DETECTED NOT DETECTED Final   Enterobacteriaceae species NOT DETECTED NOT DETECTED Final   Enterobacter cloacae complex NOT DETECTED NOT DETECTED Final   Escherichia coli NOT DETECTED NOT DETECTED Final   Klebsiella oxytoca NOT DETECTED NOT DETECTED Final   Klebsiella pneumoniae NOT DETECTED NOT DETECTED Final   Proteus species NOT DETECTED NOT DETECTED Final   Serratia marcescens NOT DETECTED NOT DETECTED Final   Haemophilus influenzae NOT DETECTED NOT DETECTED Final   Neisseria meningitidis NOT DETECTED NOT DETECTED Final   Pseudomonas aeruginosa NOT DETECTED NOT DETECTED Final   Candida albicans NOT DETECTED NOT DETECTED Final   Candida glabrata NOT DETECTED NOT DETECTED Final   Candida krusei NOT DETECTED NOT DETECTED Final   Candida parapsilosis NOT DETECTED NOT DETECTED Final   Candida tropicalis NOT DETECTED NOT DETECTED Final    Comment: Performed at [facility], [address] [place], [place] [address]  Culture, Urine     Status: Abnormal   Collection Time: 08/10/18  3:48 AM  Result Value Ref Range Status   Specimen Description   Final    URINE, CLEAN CATCH Performed at [facility], [address] [place], [place] [address]    Special Requests   Final    Normal Performed at [facility], [address] [place], [place] [address]    Culture MULTIPLE SPECIES PRESENT, SUGGEST RECOLLECTION (A)  Final   Report Status 08/12/2018 FINAL  Final  Culture, Urine     Status: Abnormal    Collection Time: 08/10/18 10:15 AM  Result Value Ref Range Status   Specimen Description   Final    URINE, CATHETERIZED Performed at [facility], [address] [place], [place] [address]    Special Requests   Final    NONE Performed at [facility], [address], [place], [place] [address]    Culture MULTIPLE SPECIES PRESENT, SUGGEST RECOLLECTION (A)  Final   Report Status 08/12/2018 FINAL  Final  Culture, blood (Routine X 2) w Reflex to ID Panel     Status: None   Collection Time: 08/11/18  2:07 PM  Result Value Ref Range Status   Specimen Description BLOOD LEFT ANTECUBITAL  Final   Special Requests   Final    BOTTLES DRAWN AEROBIC AND ANAEROBIC Blood Culture results may not be optimal due to an excessive volume of blood received in culture bottles   Culture   Final    NO GROWTH 5 DAYS Performed at [facility], [address], [place], [place] [address]    Report Status 08/16/2018 FINAL  Final  Culture, blood (Routine X 2) w Reflex to ID Panel     Status: Abnormal   Collection Time: 08/11/18  2:07 PM  Result Value Ref Range Status   Specimen Description   Final    BLOOD RIGHT ANTECUBITAL Performed at [facility], [address], [place], [place] [address]    Special Requests   Final    BOTTLES DRAWN AEROBIC AND ANAEROBIC Blood Culture adequate volume Performed at [facility], [address], [place], [place] [address]    Culture  Setup Time   Final    GRAM NEGATIVE RODS RECOVERED FROM THE AEROBIC BOTTLE Gram Stain Report Called to,Read Back By and Verified With: SMITH,J. AT 1110 ON 08/12/2018 BY BAUGHAM,M. Performed at [place], READ BACK BY AND VERIFIED WITH: E MURPHY RN 08/12/18 1703 JDW Performed at [facility], [place] [address] [place], [place] [address]    Culture PSEUDOMONAS AERUGINOSA (A)  Final   Report Status 08/14/2018 FINAL  Final   Organism ID, Bacteria PSEUDOMONAS AERUGINOSA  Final      Susceptibility    Pseudomonas aeruginosa - MIC*    CEFTAZIDIME 2 SENSITIVE Sensitive     CIPROFLOXACIN <=0.25 SENSITIVE Sensitive     GENTAMICIN <=1 SENSITIVE Sensitive     IMIPENEM 1 SENSITIVE Sensitive     PIP/TAZO <=4 SENSITIVE Sensitive     CEFEPIME <=1 SENSITIVE Sensitive     * PSEUDOMONAS AERUGINOSA  Blood Culture ID Panel (Reflexed)     Status: Abnormal   Collection Time: 08/11/18  2:07 PM  Result Value Ref Range Status   Enterococcus species NOT DETECTED NOT DETECTED Final   Listeria monocytogenes NOT DETECTED NOT DETECTED Final   Staphylococcus species NOT DETECTED NOT DETECTED Final   Staphylococcus aureus NOT DETECTED NOT DETECTED Final   Streptococcus species NOT DETECTED NOT DETECTED Final   Streptococcus agalactiae NOT DETECTED NOT DETECTED Final   Streptococcus pneumoniae NOT DETECTED NOT DETECTED Final   Streptococcus pyogenes NOT DETECTED NOT DETECTED Final   Acinetobacter baumannii NOT DETECTED NOT DETECTED Final   Enterobacteriaceae species NOT DETECTED NOT DETECTED Final   Enterobacter cloacae complex NOT DETECTED NOT DETECTED Final   Escherichia coli NOT DETECTED NOT DETECTED Final   Klebsiella oxytoca NOT DETECTED NOT DETECTED Final   Klebsiella pneumoniae NOT DETECTED NOT DETECTED Final   Proteus species NOT DETECTED NOT DETECTED Final   Serratia marcescens NOT DETECTED NOT DETECTED Final   Carbapenem resistance NOT DETECTED NOT DETECTED Final   Haemophilus influenzae NOT DETECTED NOT DETECTED Final   Neisseria meningitidis NOT DETECTED NOT DETECTED Final   Pseudomonas aeruginosa DETECTED (A) NOT DETECTED Final    Comment: CRITICAL RESULT CALLED TO, READ BACK BY AND VERIFIED WITH: E MURPHY RN 08/12/18 1703 JDW    Candida albicans NOT DETECTED NOT DETECTED Final   Candida glabrata NOT  DETECTED NOT DETECTED Final   Candida krusei NOT DETECTED NOT DETECTED Final   Candida parapsilosis NOT DETECTED NOT DETECTED Final   Candida tropicalis NOT DETECTED NOT DETECTED Final     Comment: Performed at [facility], [place] [address], [place], [place] [address]  Culture, blood (routine x 2)     Status: None (Preliminary result)   Collection Time: 08/14/18  6:52 AM  Result Value Ref Range Status   Specimen Description   Final    BLOOD LEFT ANTECUBITAL Performed at [place] [address] [place], [place] [address]    Special Requests   Final    BOTTLES DRAWN AEROBIC ONLY Blood Culture adequate volume Performed at [place] [address], [place], [place] [address]    Culture   Final    NO GROWTH 3 DAYS Performed at [facility], [place] [address], [place], [place] [address]    Report Status PENDING  Incomplete  Culture, blood (routine x 2)     Status: None (Preliminary result)   Collection Time: 08/14/18  6:52 AM  Result Value Ref Range Status   Specimen Description   Final    BLOOD RIGHT ANTECUBITAL Performed at [place] [address], [place], [place] [address]    Special Requests   Final    BOTTLES DRAWN AEROBIC ONLY Blood Culture adequate volume Performed at [place] [address] [place], [place] [address]    Culture   Final    NO GROWTH 3 DAYS Performed at [facility], [place] [address] [place], [place] [address]    Report Status PENDING  Incomplete  MRSA PCR Screening     Status: None   Collection Time: 08/15/18 12:15 PM  Result Value Ref Range Status   MRSA by PCR NEGATIVE NEGATIVE Final    Comment:        The GeneXpert MRSA Assay (FDA approved for NASAL specimens only), is one component of a comprehensive MRSA colonization surveillance program. It is not intended to diagnose MRSA infection nor to guide or monitor treatment for MRSA infections. Performed at [facility], [place] [address], [place], [place] [address]      Labs: BNP (last 3 results) No results for input(s): BNP in the last 8760 hours. Basic  Metabolic Panel: Recent Labs  Lab 08/11/18 0400 08/12/18 1048 08/14/18 1518 08/15/18 0400 08/16/18 0330 08/17/18 0319  NA 139 140 145 145 141 145  K 4.8 4.6 5.3* 5.3* 4.2 3.8  CL 115* 115* 119* 120* 113* 108  CO2 19* 17* 16* 18* 20* 27  GLUCOSE 84 68* 47* 94 116* 106*  BUN 35* 28* 35* 35* 35* 33*  CREATININE 4.55* 3.95* 4.68* 5.07* 5.60* 5.19*  CALCIUM 7.0* 6.9* 7.1* 7.0* 6.6* 6.9*  PHOS 3.8  --   --   --   --   --    Liver Function Tests: Recent Labs  Lab 08/11/18 0400  ALBUMIN 2.2*   No results for input(s): LIPASE, AMYLASE in the last 168 hours. No results for input(s): AMMONIA in the last 168 hours. CBC: Recent Labs  Lab 08/12/18 1048 08/14/18 1518 08/15/18 0400 08/16/18 0330 08/16/18 1538 08/17/18 0319  WBC 5.5 7.9 8.5 5.8  --  7.2  NEUTROABS  --  7.0 7.5 4.9  --  6.2  HGB 8.7* 7.6* 7.7* 6.0* 8.9* 9.5*  HCT 27.0* 23.3* 23.4* 18.1* 26.3* 27.9*  MCV 92.8  95.9 97.1 92.8  --  90.6  PLT 129* 124* 136* 121*  --  121*   Cardiac Enzymes: No results for input(s): CKTOTAL, CKMB, CKMBINDEX, TROPONINI in the last 168 hours. BNP: Invalid input(s): POCBNP CBG: Recent Labs  Lab 08/14/18 1702 08/14/18 1806 08/15/18 0630 08/15/18 1250  GLUCAP 41* 78 88 97   D-Dimer No results for input(s): DDIMER in the last 72 hours. Hgb A1c No results for input(s): HGBA1C in the last 72 hours. Lipid Profile No results for input(s): CHOL, HDL, LDLCALC, TRIG, CHOLHDL, LDLDIRECT in the last 72 hours. Thyroid function studies No results for input(s): TSH, T4TOTAL, T3FREE, THYROIDAB in the last 72 hours.  Invalid input(s): FREET3 Anemia work up No results for input(s): VITAMINB12, FOLATE, FERRITIN, TIBC, IRON, RETICCTPCT in the last 72 hours. Urinalysis    Component Value Date/Time   COLORURINE YELLOW 08/10/2018 1015   APPEARANCEUR HAZY (A) 08/10/2018 1015   LABSPEC 1.011 08/10/2018 1015   PHURINE 7.0 08/10/2018 1015   GLUCOSEU NEGATIVE 08/10/2018 1015   HGBUR LARGE (A)  08/10/2018 1015   BILIRUBINUR NEGATIVE 08/10/2018 1015   KETONESUR NEGATIVE 08/10/2018 1015   PROTEINUR 100 (A) 08/10/2018 1015   NITRITE NEGATIVE 08/10/2018 1015   LEUKOCYTESUR MODERATE (A) 08/10/2018 1015   Sepsis Labs Invalid input(s): PROCALCITONIN,  WBC,  LACTICIDVEN Microbiology Recent Results (from the past 240 hour(s))  Culture, blood (Routine x 2)     Status: Abnormal   Collection Time: 08/09/18  2:38 PM  Result Value Ref Range Status   Specimen Description   Final    BLOOD RIGHT ANTECUBITAL Performed at [facility], [address] [place] [address], [place], [place] [address]    Special Requests   Final    BOTTLES DRAWN AEROBIC AND ANAEROBIC Blood Culture adequate volume Performed at [facility], [address], [place], [place] [address]    Culture  Setup Time   Final    GRAM POSITIVE COCCI IN CHAINS IN BOTH AEROBIC AND ANAEROBIC BOTTLES Gram Stain Report Called to,Read Back By and Verified With: HEARNE,J AT 0630 BY HUFFINES,S ON 08/10/18. CRITICAL VALUE NOTED.  VALUE IS CONSISTENT WITH PREVIOUSLY REPORTED AND CALLED VALUE.    Culture (A)  Final    ENTEROCOCCUS FAECALIS SUSCEPTIBILITIES PERFORMED ON PREVIOUS CULTURE WITHIN THE LAST 5 DAYS. Performed at [facility], [place] [address] [place], [place] [address]    Report Status 08/12/2018 FINAL  Final  Culture, blood (Routine x 2)     Status: Abnormal   Collection Time: 08/09/18  3:45 PM  Result Value Ref Range Status   Specimen Description   Final    BLOOD CENTRAL LINE Performed at [facility], [address], [place], [place] [address]    Special Requests   Final    BOTTLES DRAWN AEROBIC AND ANAEROBIC Blood Culture adequate volume Performed at [facility], [address], [place],  [address]    Culture  Setup Time   Final    GRAM POSITIVE COCCI Gram Stain Report Called to,Read Back By and Verified With: HEARN,J.  [id number] ON 08/10/2018 BY EVA IN BOTH AEROBIC AND ANAEROBIC BOTTLES Performed at [place], READ BACK BY AND VERIFIED WITH: Johny Chess RN [id number] 08/10/18 A BROWNING    Culture ENTEROCOCCUS FAECALIS (A)  Final   Report Status 08/12/2018 FINAL  Final   Organism ID, Bacteria ENTEROCOCCUS FAECALIS  Final      Susceptibility   Enterococcus faecalis - MIC*    AMPICILLIN <=2 SENSITIVE  Sensitive     VANCOMYCIN 1 SENSITIVE Sensitive     GENTAMICIN SYNERGY SENSITIVE Sensitive     * ENTEROCOCCUS FAECALIS  Blood Culture ID Panel (Reflexed)     Status: Abnormal   Collection Time: 08/09/18  3:45 PM  Result Value Ref Range Status   Enterococcus species DETECTED (A) NOT DETECTED Final    Comment: CRITICAL RESULT CALLED TO, READ BACK BY AND VERIFIED WITH: Johny Chess RN [phone] 08/10/18 A BROWNING    Vancomycin resistance NOT DETECTED NOT DETECTED Final   Listeria monocytogenes NOT DETECTED NOT DETECTED Final   Staphylococcus species NOT DETECTED NOT DETECTED Final   Staphylococcus aureus NOT DETECTED NOT DETECTED Final   Streptococcus species NOT DETECTED NOT DETECTED Final   Streptococcus agalactiae NOT DETECTED NOT DETECTED Final   Streptococcus pneumoniae NOT DETECTED NOT DETECTED Final   Streptococcus pyogenes NOT DETECTED NOT DETECTED Final   Acinetobacter baumannii NOT DETECTED NOT DETECTED Final   Enterobacteriaceae species NOT DETECTED NOT DETECTED Final   Enterobacter cloacae complex NOT DETECTED NOT DETECTED Final   Escherichia coli NOT DETECTED NOT DETECTED Final   Klebsiella oxytoca NOT DETECTED NOT DETECTED Final   Klebsiella pneumoniae NOT DETECTED NOT DETECTED Final   Proteus species NOT DETECTED NOT DETECTED Final   Serratia marcescens NOT DETECTED NOT DETECTED Final   Haemophilus influenzae NOT DETECTED NOT DETECTED Final   Neisseria meningitidis NOT DETECTED NOT DETECTED Final   Pseudomonas aeruginosa NOT DETECTED NOT DETECTED Final   Candida albicans NOT DETECTED NOT DETECTED Final   Candida glabrata NOT DETECTED NOT DETECTED Final    Candida krusei NOT DETECTED NOT DETECTED Final   Candida parapsilosis NOT DETECTED NOT DETECTED Final   Candida tropicalis NOT DETECTED NOT DETECTED Final    Comment: Performed at [facility], [address] [place], [place] [address]  Culture, Urine     Status: Abnormal   Collection Time: 08/10/18  3:48 AM  Result Value Ref Range Status   Specimen Description   Final    URINE, CLEAN CATCH Performed at [facility], [address], [place], [place] [address]    Special Requests   Final    Normal Performed at [facility], [address] [place], [place] [address]    Culture MULTIPLE SPECIES PRESENT, SUGGEST RECOLLECTION (A)  Final   Report Status 08/12/2018 FINAL  Final  Culture, Urine     Status: Abnormal   Collection Time: 08/10/18 10:15 AM  Result Value Ref Range Status   Specimen Description   Final    URINE, CATHETERIZED Performed at [facility], [address], [place], [place] [address]    Special Requests   Final    NONE Performed at [facility], [address], [place], [place] [address]    Culture MULTIPLE SPECIES PRESENT, SUGGEST RECOLLECTION (A)  Final   Report Status 08/12/2018 FINAL  Final  Culture, blood (Routine X 2) w Reflex to ID Panel     Status: None   Collection Time: 08/11/18  2:07 PM  Result Value Ref Range Status   Specimen Description BLOOD LEFT ANTECUBITAL  Final   Special Requests   Final    BOTTLES DRAWN AEROBIC AND ANAEROBIC Blood Culture results may not be optimal due to an excessive volume of blood received in culture bottles   Culture   Final    NO GROWTH 5 DAYS Performed at [facility], [address] [place], [place] [address]    Report Status 08/16/2018 FINAL  Final  Culture, blood (Routine X 2) w Reflex to ID Panel     Status: Abnormal   Collection Time: 08/11/18  2:07 PM  Result Value Ref Range Status   Specimen Description   Final    BLOOD RIGHT ANTECUBITAL Performed at [facility], [address] [place], [place]  [address]    Special Requests   Final    BOTTLES DRAWN AEROBIC AND ANAEROBIC Blood Culture adequate volume Performed at [facility], [address], [place], [place] [address]    Culture  Setup Time   Final    GRAM NEGATIVE RODS RECOVERED FROM THE AEROBIC BOTTLE Gram Stain Report Called to,Read Back By and Verified With: SMITH,J. AT 6160 ON 08/12/2018 BY BAUGHAM,M. Performed at [place], READ BACK BY AND VERIFIED WITH: E MURPHY RN 08/12/18 1703 JDW Performed at [facility], [place] [address], [place], [place] [address]    Culture PSEUDOMONAS AERUGINOSA (A)  Final   Report Status 08/14/2018 FINAL  Final   Organism ID, Bacteria PSEUDOMONAS AERUGINOSA  Final      Susceptibility   Pseudomonas aeruginosa - MIC*    CEFTAZIDIME 2 SENSITIVE Sensitive     CIPROFLOXACIN <=0.25 SENSITIVE Sensitive     GENTAMICIN <=1 SENSITIVE Sensitive     IMIPENEM 1 SENSITIVE Sensitive     PIP/TAZO <=4 SENSITIVE Sensitive     CEFEPIME <=1 SENSITIVE Sensitive     * PSEUDOMONAS AERUGINOSA  Blood Culture ID Panel (Reflexed)     Status: Abnormal   Collection Time: 08/11/18  2:07 PM  Result Value Ref Range Status   Enterococcus species NOT DETECTED NOT DETECTED Final   Listeria monocytogenes NOT DETECTED NOT DETECTED Final   Staphylococcus species NOT DETECTED NOT DETECTED Final   Staphylococcus aureus NOT DETECTED NOT DETECTED Final   Streptococcus species NOT DETECTED NOT DETECTED Final   Streptococcus agalactiae NOT DETECTED NOT DETECTED Final   Streptococcus pneumoniae NOT DETECTED NOT DETECTED Final   Streptococcus pyogenes NOT DETECTED NOT DETECTED Final   Acinetobacter baumannii NOT DETECTED NOT DETECTED Final   Enterobacteriaceae species NOT DETECTED NOT DETECTED Final   Enterobacter cloacae complex NOT DETECTED NOT DETECTED Final   Escherichia coli NOT DETECTED NOT DETECTED Final   Klebsiella oxytoca NOT DETECTED NOT DETECTED Final   Klebsiella pneumoniae NOT  DETECTED NOT DETECTED Final   Proteus species NOT DETECTED NOT DETECTED Final   Serratia marcescens NOT DETECTED NOT DETECTED Final   Carbapenem resistance NOT DETECTED NOT DETECTED Final   Haemophilus influenzae NOT DETECTED NOT DETECTED Final   Neisseria meningitidis NOT DETECTED NOT DETECTED Final   Pseudomonas aeruginosa DETECTED (A) NOT DETECTED Final    Comment: CRITICAL RESULT CALLED TO, READ BACK BY AND VERIFIED WITH: E MURPHY RN 08/12/18 1703 JDW    Candida albicans NOT DETECTED NOT DETECTED Final   Candida glabrata NOT DETECTED NOT DETECTED Final   Candida krusei NOT DETECTED NOT DETECTED Final   Candida parapsilosis NOT DETECTED NOT DETECTED Final   Candida tropicalis NOT DETECTED NOT DETECTED Final    Comment: Performed at [facility], [address], [place], [place] [address]  Culture, blood (routine x 2)     Status: None (Preliminary result)   Collection Time: 08/14/18  6:52 AM  Result Value Ref Range Status   Specimen Description   Final    BLOOD LEFT ANTECUBITAL Performed at [place] [address], [place], [place] [address]    Special Requests  Final    BOTTLES DRAWN AEROBIC ONLY Blood Culture adequate volume Performed at [place] [address], [place], [place] [address]    Culture   Final    NO GROWTH 3 DAYS Performed at [facility], [place] [address], [place], [place] [address]    Report Status PENDING  Incomplete  Culture, blood (routine x 2)     Status: None (Preliminary result)   Collection Time: 08/14/18  6:52 AM  Result Value Ref Range Status   Specimen Description   Final    BLOOD RIGHT ANTECUBITAL Performed at [place] [address], [place], [place] [address]    Special Requests   Final    BOTTLES DRAWN AEROBIC ONLY Blood Culture adequate volume Performed at [place] [address] [place], [place] [address]    Culture   Final    NO  GROWTH 3 DAYS Performed at [facility], [place] [address] [place], [place] [address]    Report Status PENDING  Incomplete  MRSA PCR Screening     Status: None   Collection Time: 08/15/18 12:15 PM  Result Value Ref Range Status   MRSA by PCR NEGATIVE NEGATIVE Final    Comment:        The GeneXpert MRSA Assay (FDA approved for NASAL specimens only), is one component of a comprehensive MRSA colonization surveillance program. It is not intended to diagnose MRSA infection nor to guide or monitor treatment for MRSA infections. Performed at [facility], [place] [address] [place], [place] [address]     Please note: You were cared for by a hospitalist during your hospital stay. Once you are discharged, your primary care physician will handle any further medical issues. Please note that NO REFILLS for any discharge medications will be authorized once you are discharged, as it is imperative that you return to your primary care physician (or establish a relationship with a primary care physician if you do not have one) for your post hospital discharge needs so that they can reassess your need for medications and monitor your lab values.    Time coordinating discharge: 40 minutes  SIGNED:   Shelly Coss, MD  [facility] 08/17/2018, 4:05 PM Pager [phone]  If 7PM-7AM, please contact night-coverage [email or web address] Password [email or web address]

## 2018-08-17 NOTE — Clinical Social Work Note (Signed)
Clinical Social Work Assessment  Patient Details  Name: Carl Murphy MRN: [id number] Date of Birth: 19-Mar-1946  Date of referral:  08/17/18               Reason for consult:  Discharge Planning, Transportation, End of Life/Hospice                Permission sought to share information with:  Family Supports, Customer service manager Permission granted to share information::  Yes, Verbal Permission Granted  Name::        Agency::  Residential Hospice   Relationship::  Adult Children   Contact Information:  [phone]  Housing/Transportation Living arrangements for the past 2 months:  [place] of Information:  Adult Children Patient Interpreter Needed:  None Criminal Activity/Legal Involvement Pertinent to Current Situation/Hospitalization:  No - Comment as needed Significant Relationships:  Adult Children Lives with:  Adult Children Do you feel safe going back to the place where you live?  No Need for family participation in patient care:  Yes   Care giving concerns:   Residential Hospice.   Social Worker assessment / plan: CSW met with the patient adult children at bedside. Patient family agreeable to hospice care at a residential facility. Patient family prefers a facility in [place]. CSW discussed residential hospice process. CSW sent referral to [facility]. Patient clinical under review. The facility admissions coordinator Vito Backers will inform the CSW if they are able to accept.  CSW will continue to assist with discharge.   Plan: Residential Hospice.   Employment status:  Retired Nurse, adult PT Recommendations:  Not assessed at this time Information / Referral to community resources:     Patient/Family's Response to care:  Family is agreeable to transition to residential hospice.   Patient/Family's Understanding of and Emotional Response to Diagnosis, Current Treatment, and Prognosis:  Patient and family has dicussed the patient current medical diagnosis and prognosis. Patient family has decided to go with hospice care.   Emotional Assessment Appearance:  Appears stated age Attitude/Demeanor/Rapport:    Affect (typically observed):  Unable to Assess Orientation:    Alcohol / Substance use:  Not Applicable Psych involvement (Current and /or in the community):  No (Comment)  Discharge Needs  Concerns to be addressed:  Discharge Planning Concerns Readmission within the last 30 days:  Yes Current discharge risk:  Terminally ill Barriers to Discharge:      Lia Hopping, LCSW 08/17/2018, 2:48 PM

## 2018-08-17 NOTE — Progress Notes (Signed)
Patient ID: Carl Murphy, male   DOB: 03-May-1946, 72 y.o.   MRN: [id number]  This NP visited patient at the bedside as a follow up for palliative medicine needs and for continued conversation regarding goals of care.   Wife and two children are at the bedside.  All understand the seriousness of the situation and the likely short prognosis.  Hope is for comfort and dignity at EOL.  Family have made a decision to shift to a comfort approach, foregoing life prolonging measures.  Prognosis is likely hours to days.  Family hopes to transition to a residential hospice closer to home in [facility], will write for choice.  Plan is to continue current medical interventions until information on bed availability is known.  If patient can transfer today, we will discontinue current medical interventions and focus on interventions to maximize comfort. - Fentanyl 12.5-25 mcg every 2 hrs prn - B&O suppository PR every 6 hrs prn   Natural trajectroy and expectations at EOL discussed   Questions and concerns addressed   Discussed with Dr Tawanna Solo  Total time spent on the unit was 60 minutes  Greater than 50% of the time was spent in counseling and coordination of care  Wadie Lessen NP  Palliative Medicine Team Team Phone # [phone] Pager [phone]

## 2018-08-17 NOTE — Progress Notes (Signed)
Nutrition Follow-up  DOCUMENTATION CODES:   Non-severe (moderate) malnutrition in context of chronic illness  INTERVENTION:  - Continue to provide nutrition items per patient desire.    NUTRITION DIAGNOSIS:   Moderate Malnutrition related to chronic illness, cancer and cancer related treatments(bladder cancer, COPD) as evidenced by mild muscle depletion, percent weight loss(8.9% weight loss in less than 2 months). -ongoing  GOAL:   Patient will meet greater than or equal to 90% of their needs -unmet  MONITOR:   PO intake, Supplement acceptance, Weight trends, Labs, Skin  ASSESSMENT:   72 year old male who presented to the ED with low hemoglobin. PMH significant for bladder cancer with bilateral nephrostomy tubes, recurrent anemia, hypertension, depression, and COPD.  Weight has fluctuated throughout admission. Current weight is +9 lbs compared to admission. Patient noted to be confused. Palliative Care following and last saw patient/met with patient and family yesterday evening. Dr. Kirstie Mirza note states "family is considering transition to comfort care with possible residential hospice." Spoke with RN who reports patient has not wanted anything to eat. Family and staff have been offering and trays have been ordered for patient but he is not interested in eating anything.    Medications reviewed. Labs reviewed; BUN: 33 mg/dL, creatinine: 5.19 mg/dL, Ca: 6.9 mg/dL, GFR: 10 mL/min. IVF; D5-150 mEq sodium bicarb @ 75 mL/hr (306 kcal).     Diet Order:   Diet Order            Diet regular Room service appropriate? Yes; Fluid consistency: Thin  Diet effective now              EDUCATION NEEDS:   Education needs have been addressed  Skin:  Skin Assessment: Skin Integrity Issues: Skin Integrity Issues:: Stage II, Incisions Stage II: R buttocks Incisions: abdominal (9/13)  Last BM:  08/09/18 - small type 5  Height:   Ht Readings from Last 1 Encounters:  08/13/18 5' 8"   (1.727 m)    Weight:   Wt Readings from Last 1 Encounters:  08/17/18 76.5 kg    Ideal Body Weight:  70 kg  BMI:  Body mass index is 25.64 kg/m.  Estimated Nutritional Needs:   Kcal:  2000-2200  Protein:  100-115 grams  Fluid:  >/= 2.0 L     Jarome Matin, MS, RD, LDN, [facility] Inpatient Clinical Dietitian Pager # [phone] After hours/weekend pager # [phone]

## 2018-08-19 LAB — CULTURE, BLOOD (ROUTINE X 2)
CULTURE: NO GROWTH
Culture: NO GROWTH
Special Requests: ADEQUATE
Special Requests: ADEQUATE

## 2018-08-27 ENCOUNTER — Ambulatory Visit (HOSPITAL_COMMUNITY): Payer: Medicare Other | Admitting: Hematology

## 2018-08-27 ENCOUNTER — Other Ambulatory Visit (HOSPITAL_COMMUNITY): Payer: Medicare Other

## 2018-08-27 ENCOUNTER — Encounter (HOSPITAL_COMMUNITY): Payer: Medicare Other

## 2018-08-31 DEATH — deceased

## 2018-09-28 ENCOUNTER — Other Ambulatory Visit (HOSPITAL_COMMUNITY): Payer: Medicare Other

## 2020-02-09 IMAGING — DX DG CHEST 1V PORT
1 series · 1 of 1 positions shown · non-contrast
Comparison: CT scan June 07, 2018

CLINICAL DATA: Bladder cancer metastatic the lungs.

EXAM:
PORTABLE CHEST 1 VIEW

[chest ap]
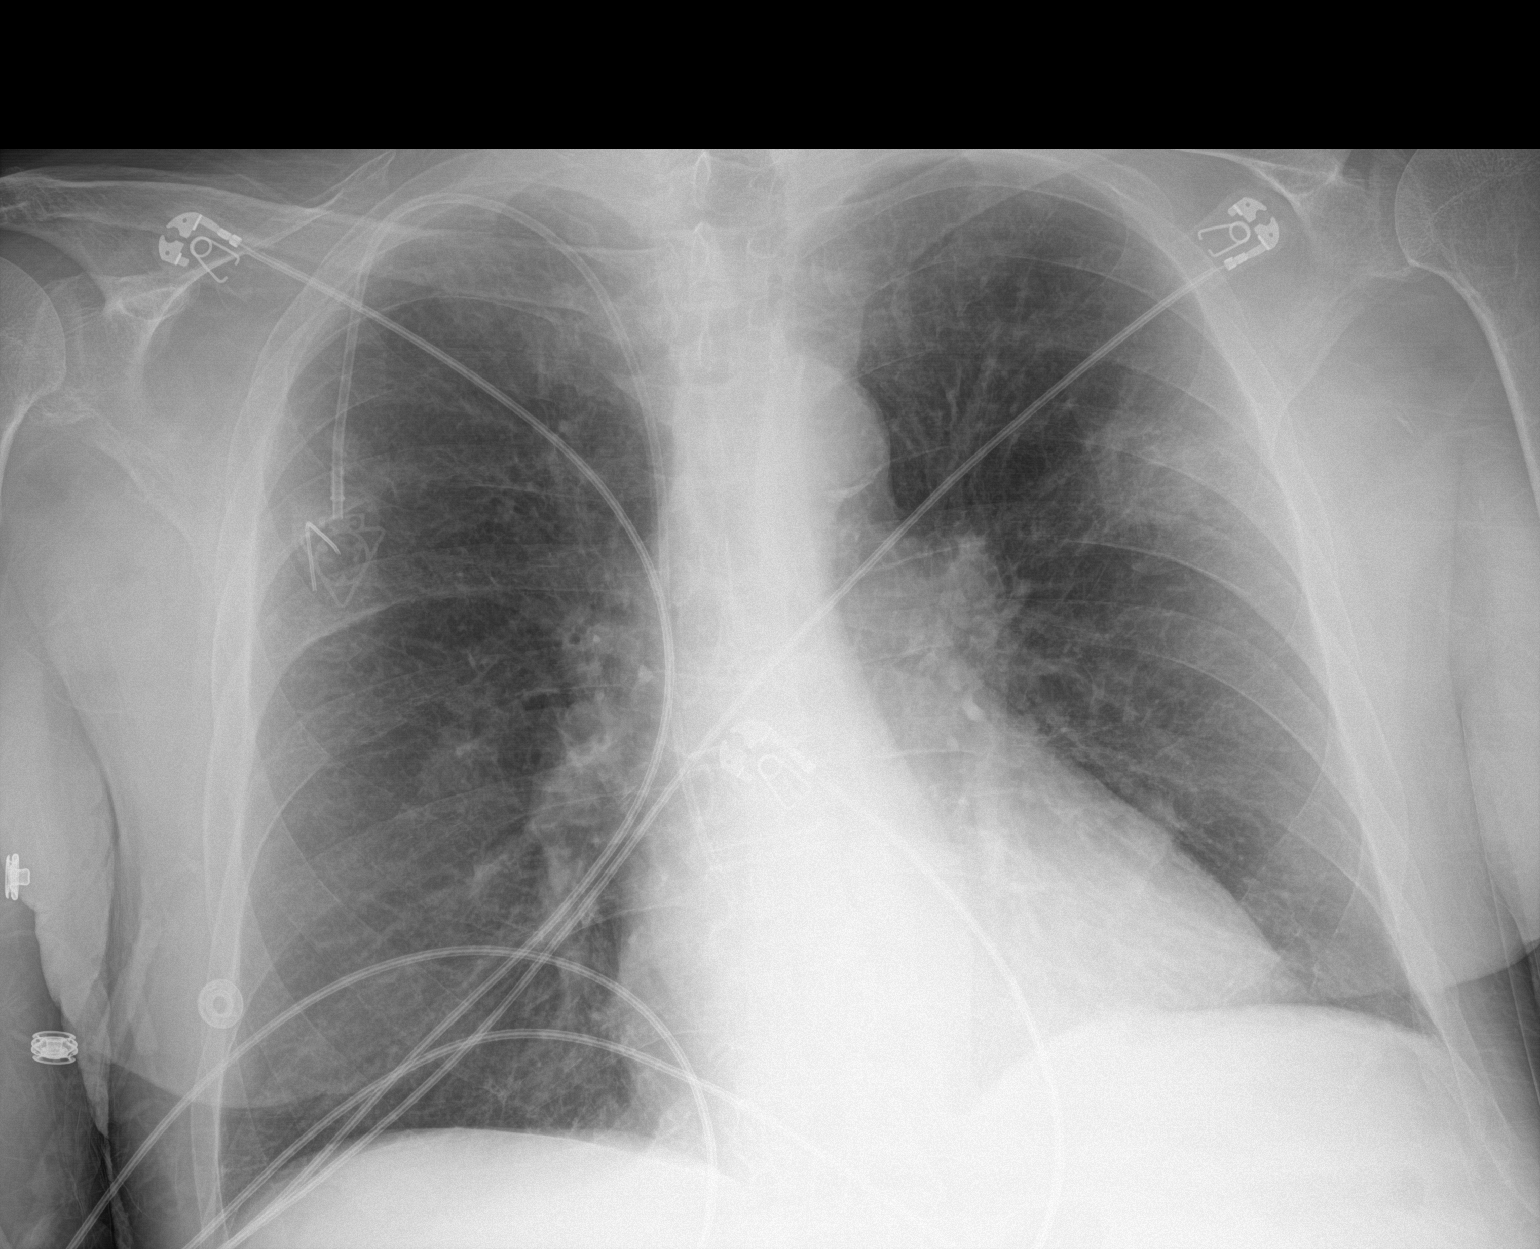

[1 of 1 positions shown; findings below may reference images not displayed]

FINDINGS: The known mass in the left upper lobe is again identified. Other
smaller nodules on the recent CT scan are not appreciated on this
study. A right Port-A-Cath is stable. No pneumothorax. Mild
cardiomegaly. No other interval changes.
IMPRESSION: Known mass in the left upper lobe. Other known pulmonary metastases
not well visualized on this chest x-ray. No acute abnormalities.

## 2020-02-10 IMAGING — CT CT ABD-PELV W/O CM
2 of 4 series · 15 of 46 positions shown, 17 images · non-contrast
Comparison: 06/07/2018

CLINICAL DATA: Invasive bladder cancer

EXAM:
CT ABDOMEN AND PELVIS WITHOUT CONTRAST
TECHNIQUE: Multidetector CT imaging of the abdomen and pelvis was performed
following the standard protocol without IV contrast.

[Series 2: axial st · axial · 0.98mm/px · z∈[+987,+1407]mm · 12 of 96 slices shown, 14 images]
[im 6/96  soft-tissue]
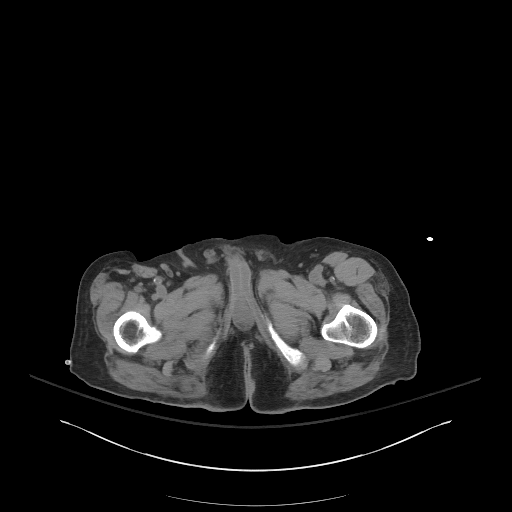
[im 6/96  bone]
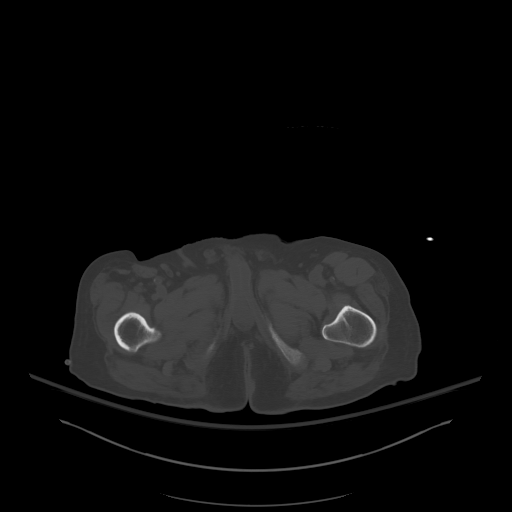
[im 17/96  soft-tissue]
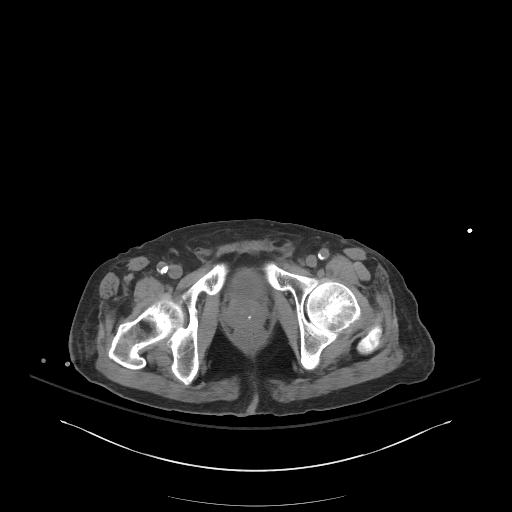
[im 23/96  soft-tissue]
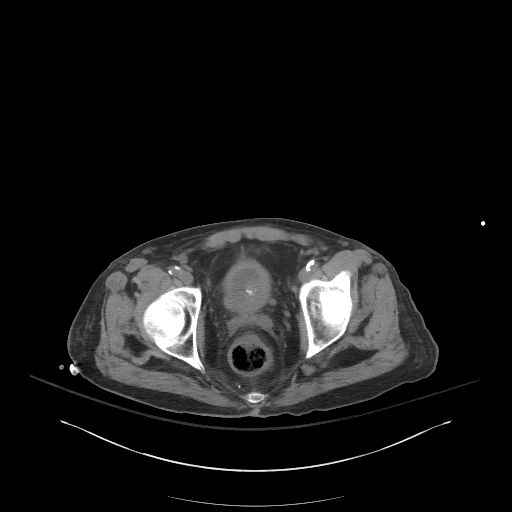
[im 28/96  soft-tissue]
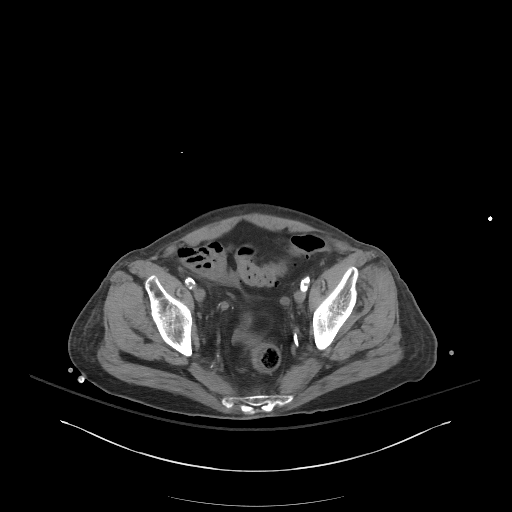
[im 40/96  soft-tissue]
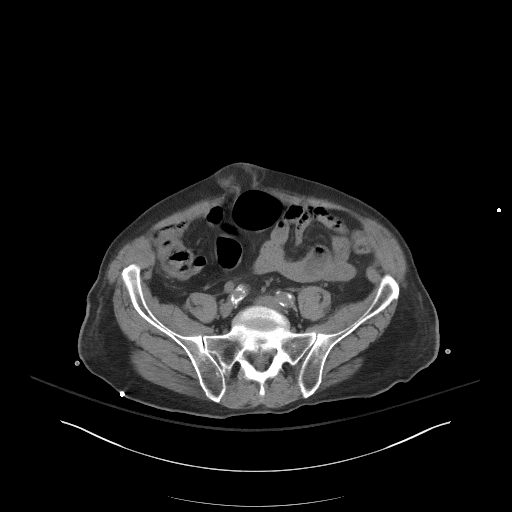
[im 45/96  soft-tissue]
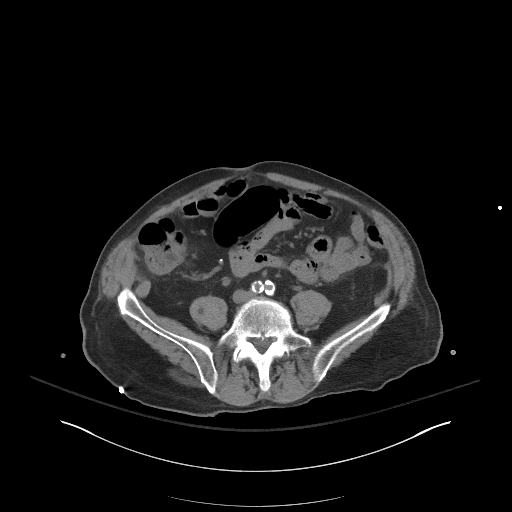
[im 51/96  soft-tissue]
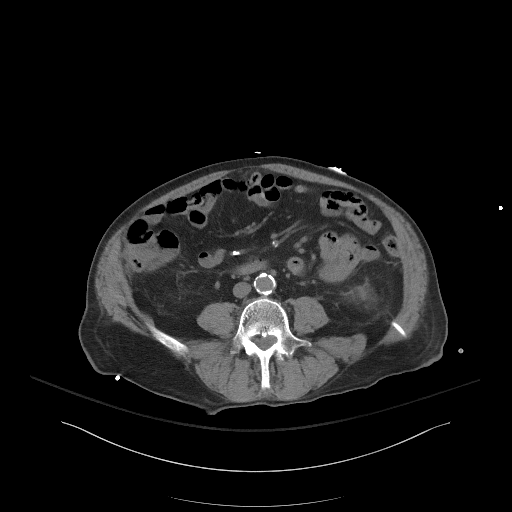
[im 62/96  soft-tissue]
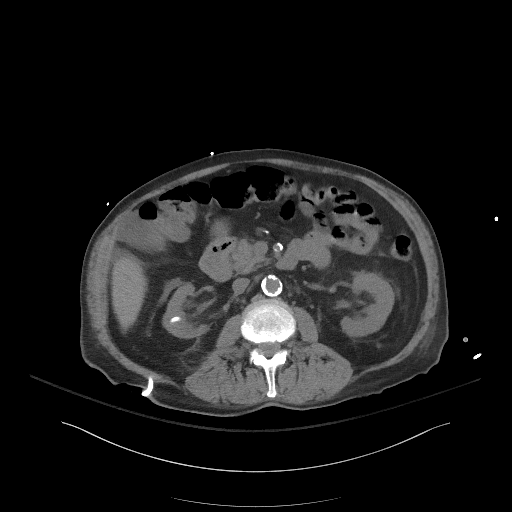
[im 68/96  soft-tissue]
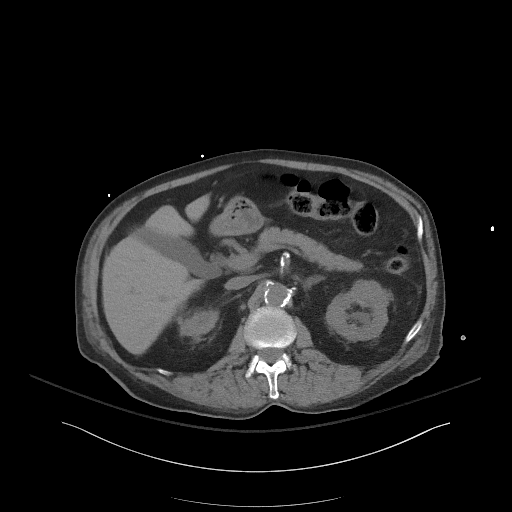
[im 68/96  bone]
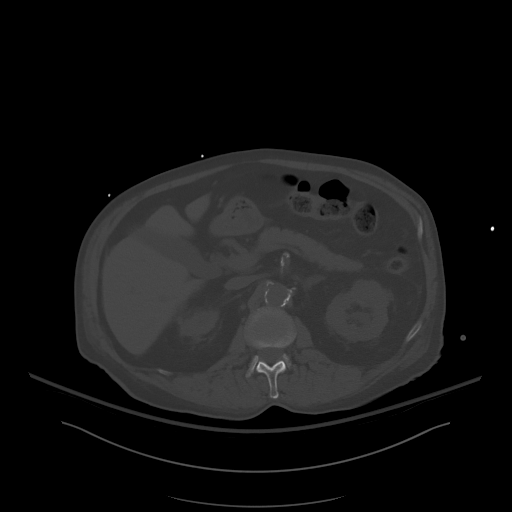
[im 73/96  soft-tissue]
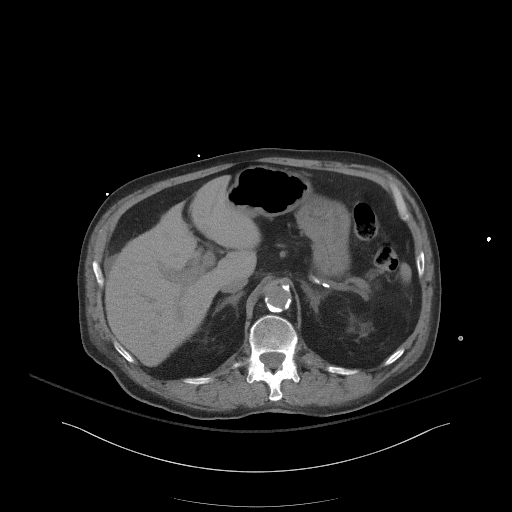
[im 84/96  soft-tissue]
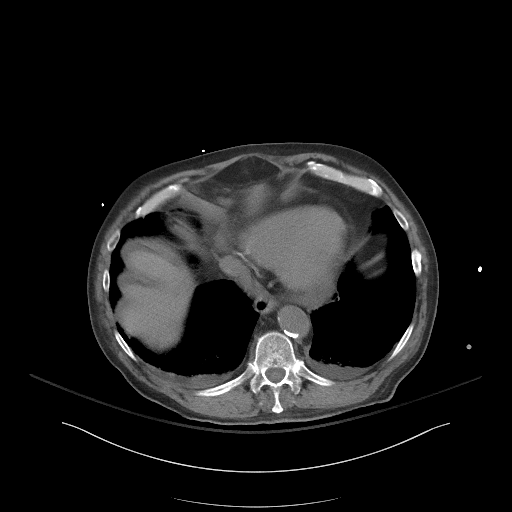
[im 90/96  soft-tissue]
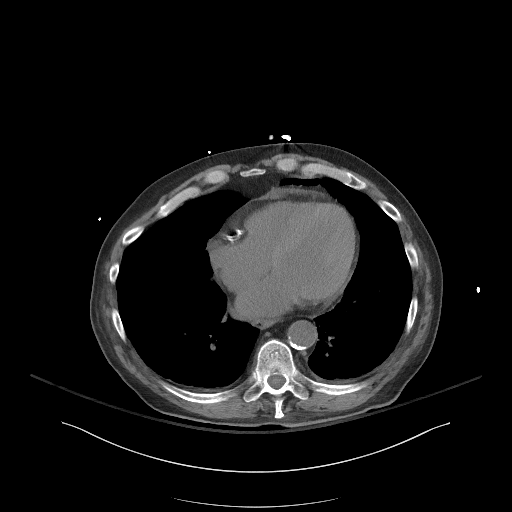

[Series 4: coronal st · coronal · 0.79mm/px · 3 of 101 slices shown]
[im 34/101  soft-tissue]
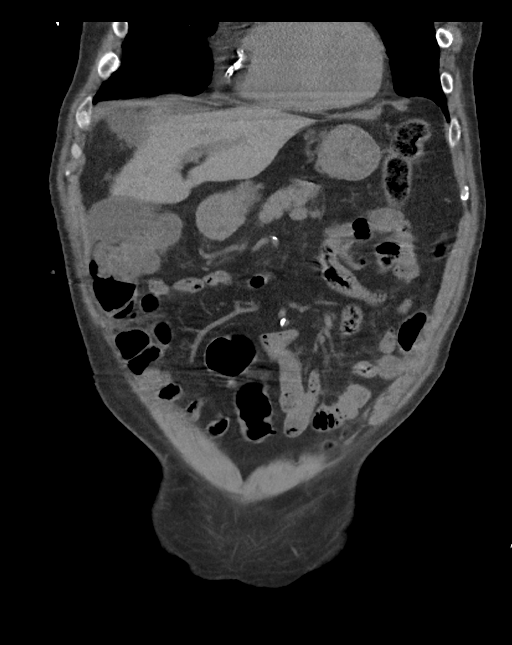
[im 45/101  soft-tissue]
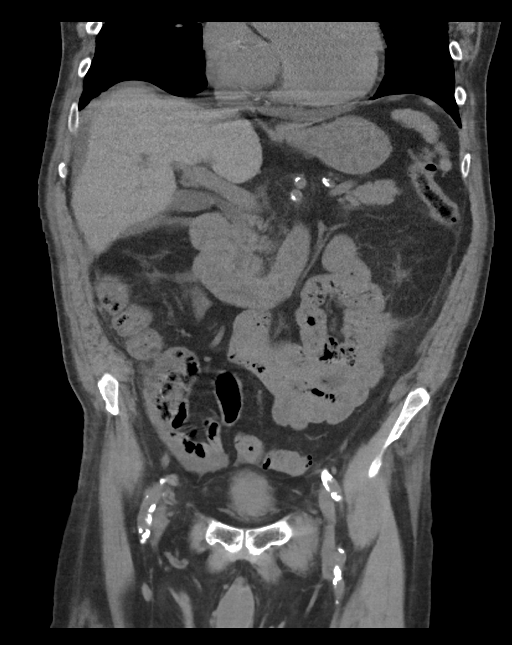
[im 56/101  soft-tissue]
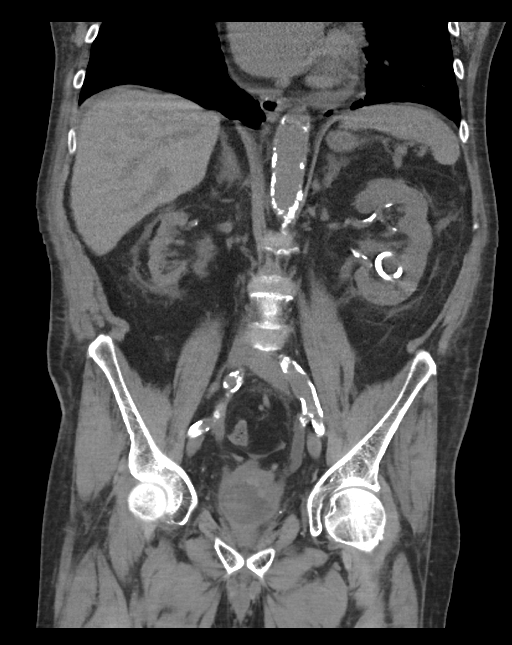

[15 of 46 positions shown; findings below may reference images not displayed]

FINDINGS: Lower chest: 1.8 cm nodule/metastasis in the anterior left upper
lobe, previously 1.9 cm. Small bilateral pleural effusions.
Emphysematous changes

Cardiomegaly. Small pericardial effusion. Three vessel coronary
atherosclerosis.

Hepatobiliary: Unenhanced liver is unremarkable.

Gallbladder is unremarkable. No intrahepatic or extrahepatic ductal
dilatation.

Pancreas: Within normal limits.

Spleen: Within normal limits.

Adrenals/Urinary Tract: Mild thickening of the bilateral renal
glands.

Right renal cortical scarring/atrophy. Indwelling percutaneous
nephrostomy catheter. Stable mild soft tissue fullness of the right
sacral ureter (coronal image 56), poorly evaluated on unenhanced CT.
No hydronephrosis.

Left percutaneous nephrostomy catheter. Left renal vascular
calcifications. Mild fullness of the left renal collecting system
without frank hydronephrosis.

Irregular bladder thickening. Layering posterior hyperdensity
suggests hemorrhage (series 2/image 76). Possible calcified lesion
on the left (series 2/image 74).

Stomach/Bowel: Stomach is within normal limits.

No evidence of bowel obstruction.

Normal appendix (series 2/image 62).

Sigmoid diverticulosis, without evidence of diverticulitis.

Vascular/Lymphatic: No evidence of abdominal aortic aneurysm.

Atherosclerotic calcifications of the abdominal aorta and branch
vessels.

No suspicious abdominopelvic lymphadenopathy.

Reproductive: Prostate is grossly unremarkable.

Other: Mild perihepatic ascites. No pelvic ascites. No frank
peritoneal nodularity.

Small fat containing periumbilical hernia (series 2/image 56).

Musculoskeletal: Visualized osseous structures are within normal
limits.
IMPRESSION: Irregular bladder thickening with layering posterior hemorrhage and
possible calcified bladder lesion in this patient with known bladder
cancer.

Stable soft tissue fullness of the right sacral ureter, poorly
evaluated on unenhanced CT, upper tract disease not excluded.
Indwelling bilateral percutaneous nephrostomy catheters. Fullness of
the left renal collecting system without frank hydronephrosis.

1.8 cm left upper lobe pulmonary metastasis, incompletely
visualized. Small bilateral pleural effusions.

Additional ancillary findings as above.
